# Patient Record
Sex: Female | Born: 1955 | Race: White | Hispanic: Yes | Marital: Single | Smoking: Former smoker
Health system: Southern US, Community
[De-identification: ages and names within clinical notes are randomized; demographics above are authoritative.]

## PROBLEM LIST (undated history)

## (undated) DIAGNOSIS — F32A Depression, unspecified: Secondary | ICD-10-CM

## (undated) DIAGNOSIS — F329 Major depressive disorder, single episode, unspecified: Secondary | ICD-10-CM

## (undated) DIAGNOSIS — I10 Essential (primary) hypertension: Secondary | ICD-10-CM

## (undated) DIAGNOSIS — E119 Type 2 diabetes mellitus without complications: Secondary | ICD-10-CM

## (undated) DIAGNOSIS — I639 Cerebral infarction, unspecified: Secondary | ICD-10-CM

## (undated) DIAGNOSIS — R569 Unspecified convulsions: Secondary | ICD-10-CM

## (undated) HISTORY — PX: CHOLECYSTECTOMY: SHX55

## (undated) HISTORY — PX: BREAST SURGERY: SHX581

## (undated) HISTORY — DX: Cerebral infarction, unspecified: I63.9

## (undated) HISTORY — PX: BREAST EXCISIONAL BIOPSY: SUR124

---
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
## 2013-09-01 ENCOUNTER — Emergency Department (HOSPITAL_COMMUNITY): Payer: Self-pay

## 2013-09-01 ENCOUNTER — Encounter (HOSPITAL_COMMUNITY): Payer: Self-pay | Admitting: Emergency Medicine

## 2013-09-01 ENCOUNTER — Emergency Department (HOSPITAL_COMMUNITY)
Admission: EM | Admit: 2013-09-01 | Discharge: 2013-09-01 | Disposition: A | Discharge status: Home / Self Care | Payer: Self-pay | Attending: Emergency Medicine | Admitting: Emergency Medicine

## 2013-09-01 DIAGNOSIS — F17200 Nicotine dependence, unspecified, uncomplicated: Secondary | ICD-10-CM | POA: Insufficient documentation

## 2013-09-01 DIAGNOSIS — R42 Dizziness and giddiness: Secondary | ICD-10-CM | POA: Insufficient documentation

## 2013-09-01 DIAGNOSIS — H8110 Benign paroxysmal vertigo, unspecified ear: Secondary | ICD-10-CM

## 2013-09-01 DIAGNOSIS — R112 Nausea with vomiting, unspecified: Secondary | ICD-10-CM | POA: Insufficient documentation

## 2013-09-01 LAB — BASIC METABOLIC PANEL
BUN: 8 mg/dL (ref 6–23)
CO2: 26 mEq/L (ref 19–32)
Calcium: 9.4 mg/dL (ref 8.4–10.5)
Chloride: 101 mEq/L (ref 96–112)
GFR calc Af Amer: >90 mL/min (ref >90)
GFR calc non Af Amer: >90 mL/min (ref >90)
Sodium: 137 mEq/L (ref 135–145)

## 2013-09-01 LAB — CBC
HCT: 47.8 % — ABNORMAL HIGH (ref 36.0–46.0)
MCH: 31.2 pg (ref 26.0–34.0)
MCV: 91.6 fL (ref 78.0–100.0)
Platelets: 259 10*3/uL (ref 150–400)
RBC: 5.22 MIL/uL — ABNORMAL HIGH (ref 3.87–5.11)
WBC: 9.2 10*3/uL (ref 4.0–10.5)

## 2013-09-01 MED ORDER — MECLIZINE HCL 25 MG PO TABS
25.0000 mg | ORAL_TABLET | Freq: Once | ORAL | Status: AC
  Administered 2013-09-01: 25 mg via ORAL
  Filled 2013-09-01: qty 1

## 2013-09-01 MED ORDER — ONDANSETRON HCL 4 MG/2ML IJ SOLN
4.0000 mg | Freq: Once | INTRAMUSCULAR | Status: AC
  Administered 2013-09-01: 4 mg via INTRAVENOUS
  Filled 2013-09-01: qty 2

## 2013-09-01 MED ORDER — ONDANSETRON HCL 4 MG PO TABS: 4.0000 mg | ORAL_TABLET | Freq: Four times a day (QID) | ORAL | Status: DC

## 2013-09-01 MED ORDER — LORAZEPAM 2 MG/ML IJ SOLN
1.0000 mg | Freq: Once | INTRAMUSCULAR | Status: AC
  Administered 2013-09-01: 1 mg via INTRAVENOUS
  Filled 2013-09-01: qty 1

## 2013-09-01 MED ORDER — MECLIZINE HCL 50 MG PO TABS: 50.0000 mg | ORAL_TABLET | Freq: Three times a day (TID) | ORAL | Status: DC | PRN

## 2013-09-01 NOTE — ED Notes (Addendum)
Pt does not speak english, tech translating. reports dizziness and nausea x3 days, has been taking dramamine with no relief. Headache 10/10 pain. Pt reports she is okay if she keeps her head still, but if she moves her head she will get really dizzy.

## 2013-09-01 NOTE — ED Provider Notes (Signed)
CSN: [id number]     Arrival date & time 09/01/13  1426 History   First MD Initiated Contact with Patient 09/01/13 1500     Chief Complaint  Patient presents with  . Dizziness   (Consider location/radiation/quality/duration/timing/severity/associated sxs/prior Treatment) HPI Pt presents with c/o feeling dizziness and like the room is spinning/vertigo.  Symptoms began 2-3 days ago.  She has also had significant nausea and vomiting- nonbilious/nonbloody.  No weakness of extremities, no changes in vision or speech.  No recent illness or fevers.  She has tried benadryl with mild relief of symptoms.  Symptoms are worse with turning her head and improved when she lies flat.  There are no other associated systemic symptoms, there are no other alleviating or modifying factors.  History reviewed. No pertinent past medical history. Past Surgical History  Procedure Laterality Date  . Cholecystectomy     History reviewed. No pertinent family history. History  Substance Use Topics  . Smoking status: Current Every Day Smoker -- 30 years    Types: Cigarettes  . Smokeless tobacco: Not on file  . Alcohol Use: Yes     Comment: weekly   OB History   Grav Para Term Preterm Abortions TAB SAB Ect Mult Living                 Review of Systems ROS reviewed and all otherwise negative except for mentioned in HPI  Allergies  Review of patient's allergies indicates no known allergies.  Home Medications   Current Outpatient Rx  Name  Route  Sig  Dispense  Refill  . dimenhyDRINATE (DRAMAMINE) 50 MG tablet   Oral   Take 50 mg by mouth every 8 (eight) hours as needed for nausea.         . meclizine (ANTIVERT) 50 MG tablet   Oral   Take 1 tablet (50 mg total) by mouth 3 (three) times daily as needed.   30 tablet   0   . ondansetron (ZOFRAN) 4 MG tablet   Oral   Take 1 tablet (4 mg total) by mouth every 6 (six) hours.   12 tablet   0    BP 165/88  Pulse 94  Temp(Src) 97.7 F (36.5 C)  (Oral)  Resp 18  SpO2 99% Vitals reviewed Physical Exam Physical Examination: General appearance - alert, well appearing, and in no distress Mental status - alert, oriented to person, place, and time Eyes - pupils equal round and reactive, EOMI Mouth - mucous membranes moist, pharynx normal without lesions Chest - clear to auscultation, no wheezes, rales or rhonchi, symmetric air entry Heart - normal rate, regular rhythm, normal S1, S2, no murmurs, rubs, clicks or gallops Abdomen - soft, nontender, nondistended, no masses or organomegaly Extremities - peripheral pulses normal, no pedal edema, no clubbing or cyanosis Neurologic- alert and oriented x 3, cranial nerves 2-12 tested and intact, strength 5/5 in extremities x 4, sensation intact, symptoms worse with lying flat and turning head to right  Skin - normal coloration and turgor, no rashes  ED Course  Procedures (including critical care time) Labs Review Labs Reviewed  CBC - Abnormal; Notable for the following:    RBC 5.22 (*)    Hemoglobin 16.3 (*)    HCT 47.8 (*)    All other components within normal limits  BASIC METABOLIC PANEL - Abnormal; Notable for the following:    Glucose, Bld 156 (*)    All other components within normal limits   Imaging Review Ct  Head Wo Contrast  09/01/2013   CLINICAL DATA:  Dizziness and nausea.  Headache.  EXAM: CT HEAD WITHOUT CONTRAST  TECHNIQUE: Contiguous axial images were obtained from the base of the skull through the vertex without intravenous contrast.  COMPARISON:  None.  FINDINGS: No acute intracranial abnormality is identified. Specifically, no intra or extra-axial hemorrhage, mass effect, hydrocephalus, or evidence of acute infarction. The skull is intact in the visualized paranasal sinuses and mastoid air cells are clear.  IMPRESSION: No acute intracranial abnormality.   Electronically Signed   By: Britta Mccreedy M.D.   On: 09/01/2013 16:23    EKG Interpretation    Date/Time:  Sunday  September 01 2013 14:43:09 EST Ventricular Rate:  93 PR Interval:  131 QRS Duration: 94 QT Interval:  354 QTC Calculation: 440 R Axis:   -14 Text Interpretation:  Sinus rhythm RSR' in V1 or V2, right VCD or RVH Borderline T abnormalities, diffuse leads ED PHYSICIAN INTERPRETATION AVAILABLE IN [facility] Confirmed by TEST, RECORD ([id number]), editor CLAYTON  CCT  CETT, ROBIN (2) on 09/02/2013 4:33:56 PM            MDM   1. Benign positional vertigo    Pt presenting with c/o vertigo.  She has normal neuro exam, head CT reassuring.  Symptoms positional in nature and worsened with turning head to side.  Pt treated with meclizine, zofran, ativan.  She was able to tolerate po fluids prior to discharge. Given information for neurology f/u.  Discharged with strict return precautions.  Pt agreeable with plan.   Ethelda Chick, MD 09/02/13 2352

## 2013-09-01 NOTE — ED Notes (Signed)
Patient transported to CT 

## 2016-09-05 ENCOUNTER — Ambulatory Visit: Payer: Self-pay | Attending: Family Medicine | Admitting: Family Medicine

## 2016-09-05 ENCOUNTER — Encounter: Payer: Self-pay | Admitting: Family Medicine

## 2016-09-05 VITALS — BP 125/87 | HR 88 | Temp 97.1°F | Resp 20 | Wt 272.0 lb

## 2016-09-05 DIAGNOSIS — R0601 Orthopnea: Secondary | ICD-10-CM | POA: Insufficient documentation

## 2016-09-05 DIAGNOSIS — N644 Mastodynia: Secondary | ICD-10-CM

## 2016-09-05 DIAGNOSIS — K219 Gastro-esophageal reflux disease without esophagitis: Secondary | ICD-10-CM

## 2016-09-05 DIAGNOSIS — Z7689 Persons encountering health services in other specified circumstances: Secondary | ICD-10-CM

## 2016-09-05 DIAGNOSIS — R42 Dizziness and giddiness: Secondary | ICD-10-CM | POA: Insufficient documentation

## 2016-09-05 LAB — CBC WITH DIFFERENTIAL/PLATELET
BASOS ABS: 0 {cells}/uL (ref 0–200)
Basophils Relative: 0 %
EOS ABS: 92 {cells}/uL (ref 15–500)
Eosinophils Relative: 1 %
HCT: 45.1 % — ABNORMAL HIGH (ref 35.0–45.0)
Hemoglobin: 15 g/dL (ref 11.7–15.5)
Lymphocytes Relative: 29 %
Lymphs Abs: 2668 cells/uL (ref 850–3900)
MCH: 30.3 pg (ref 27.0–33.0)
MCHC: 33.3 g/dL (ref 32.0–36.0)
MCV: 91.1 fL (ref 80.0–100.0)
MPV: 11.3 fL (ref 7.5–12.5)
Monocytes Absolute: 460 cells/uL (ref 200–950)
Monocytes Relative: 5 %
Neutro Abs: 5980 cells/uL (ref 1500–7800)
Neutrophils Relative %: 65 %
PLATELETS: 318 10*3/uL (ref 140–400)
RBC: 4.95 MIL/uL (ref 3.80–5.10)
RDW: 13 % (ref 11.0–15.0)
WBC: 9.2 10*3/uL (ref 3.8–10.8)

## 2016-09-05 LAB — BASIC METABOLIC PANEL
BUN: 10 mg/dL (ref 7–25)
CHLORIDE: 105 mmol/L (ref 98–110)
CO2: 30 mmol/L (ref 20–31)
Calcium: 9.4 mg/dL (ref 8.6–10.4)
Creat: 0.53 mg/dL (ref 0.50–0.99)
Glucose, Bld: 174 mg/dL — ABNORMAL HIGH (ref 65–99)
Potassium: 5 mmol/L (ref 3.5–5.3)
Sodium: 140 mmol/L (ref 135–146)

## 2016-09-05 MED ORDER — RANITIDINE HCL 150 MG PO TABS: 150.0000 mg | ORAL_TABLET | Freq: Two times a day (BID) | ORAL | 2 refills | Status: DC

## 2016-09-05 MED ORDER — MECLIZINE HCL 32 MG PO TABS: 32.0000 mg | ORAL_TABLET | Freq: Three times a day (TID) | ORAL | 0 refills | Status: DC | PRN

## 2016-09-05 NOTE — Progress Notes (Signed)
Subjective:   Chief Complaint  Patient presents with  . Breast Pain   HPI Heather [name] 60 y.o. female presents with c/o shortness of breath and night, dizziness, bilateral breast tenderness. She reports waking 5 to 6 x's throughout the night SOB. She reports orthopnea. Denies any swelling of the extremites. She has no complaints of cough. Reports history of dizziness with changing positions. She reports nausea only after meals. She reports bilateral breast tenderness with right being greater than left. Denies any trauma, breast lumps, nipple discharge, or indentations. She reports receiving a mammogram more than a year ago in [place].         Family History  Problem Relation Age of Onset  . Diabetes Mother   . Pulmonary disease Mother   . Heart disease Father     Social History Main Topics  . Smoking status: Current Every Day Smoker    Years: 30.00    Types: Cigarettes  . Smokeless tobacco: Not on file  . Alcohol use Yes     Comment: weekly  . Drug use: No  . Sexual activity: Not on file    Outpatient Medications Prior to Visit  Medication Sig Dispense Refill  . meclizine (ANTIVERT) 50 MG tablet Take 1 tablet (50 mg total) by mouth 3 (three) times daily as needed. 30 tablet 0  . dimenhyDRINATE (DRAMAMINE) 50 MG tablet Take 50 mg by mouth every 8 (eight) hours as needed for nausea.    . ondansetron (ZOFRAN) 4 MG tablet Take 1 tablet (4 mg total) by mouth every 6 (six) hours. (Patient not taking: Reported on 09/05/2016) 12 tablet 0   No facility-administered medications prior to visit.     No Known Allergies  Review of Systems  HENT: Negative.   Respiratory: Positive for shortness of breath (c/o SOB at night with frequent waking throughout the night).   Cardiovascular: Positive for orthopnea. Negative for leg swelling.  Gastrointestinal: Positive for abdominal pain and nausea (after meals). Negative for constipation.  Musculoskeletal: Positive for myalgias (bilateral breast  tenderness left greater than right).  Neurological: Positive for dizziness.  Psychiatric/Behavioral: Negative.        Objective:    Physical Exam  Constitutional: She appears well-developed and well-nourished.  Eyes: Conjunctivae and EOM are normal. Pupils are equal, round, and reactive to light. Right eye exhibits no discharge. Left eye exhibits no discharge.  Neck: Normal range of motion. Neck supple.  Cardiovascular: Normal rate, regular rhythm, normal heart sounds and intact distal pulses.   Pulmonary/Chest: Effort normal and breath sounds normal. No respiratory distress. She exhibits tenderness. Right breast exhibits tenderness. Right breast exhibits no inverted nipple, no mass, no nipple discharge and no skin change. Left breast exhibits tenderness. Left breast exhibits no inverted nipple, no nipple discharge and no skin change. Mass: firm tissue; no lumps palpated. Breasts are symmetrical.    Abdominal: Soft. Bowel sounds are normal. There is tenderness (Left lumbar region).  Lymphadenopathy:    She has no cervical adenopathy.    BP 125/87   Pulse 88   Temp 97.1 F (36.2 C) (Oral)   Resp 20   Wt 272 lb (123.4 kg)   SpO2 97%  Wt Readings from Last 3 Encounters:  09/05/16 272 lb (123.4 kg)      Assessment & Plan:   Problem List Items Addressed This Visit    None    Visit Diagnoses    Orthopnea    -  Primary   Relevant Orders  Brain natriuretic peptide   CBC with Differential   Nocturnal polysomnography (NPSG)   Breast tenderness in female       Relevant Orders   MM Digital Diagnostic Bilat   Vertigo       Relevant Medications   meclizine (ANTIVERT) 32 MG tablet   Other Relevant Orders   Basic Metabolic Panel   -Orthostatic VS.   Gastroesophageal reflux disease without esophagitis       Relevant Medications   ranitidine (ZANTAC) 150 MG tablet        I have discontinued Ms. Pena's dimenhyDRINATE, meclizine, and ondansetron. I am also having her start on  meclizine and ranitidine. Additionally, I am having her maintain her ibuprofen and aspirin EC.  Meds ordered this encounter  Medications  . ibuprofen (ADVIL,MOTRIN) 200 MG tablet    Sig: Take 200 mg by mouth every 6 (six) hours as needed.  Marland Kitchen aspirin EC 81 MG tablet    Sig: Take 81 mg by mouth as needed.  . meclizine (ANTIVERT) 32 MG tablet    Sig: Take 1 tablet (32 mg total) by mouth 3 (three) times daily as needed.    Dispense:  30 tablet    Refill:  0    Order Specific Question:   Supervising Provider    Answer:   Quentin Angst [id number]  . ranitidine (ZANTAC) 150 MG tablet    Sig: Take 1 tablet (150 mg total) by mouth 2 (two) times daily.    Dispense:  30 tablet    Refill:  2    Order Specific Question:   Supervising Provider    Answer:   Quentin Angst [id number]   Follow up : As needed  Arrie Senate, FNP

## 2016-09-05 NOTE — Patient Instructions (Addendum)
Mamografa (Mammogram) Una mamografa es un radiografa de las mamas que se realiza para determinar si hay cambios que no son normales. Este estudio permite explorar y Clinical research associate cualquier cambio que pudiera sugerir la presencia de cncer de mama. Tambin puede ayudar a identificar otros cambios y variaciones en las mamas. ANTES DEL PROCEDIMIENTO  Hgase este estudio aproximadamente 1 o 2 semanas despus de la menstruacin. Generalmente, este es el momento en que las mamas estn menos sensibles.  Si consulta a un mdico nuevo o cambia de Financial risk analyst, enve las [employer] anteriores al [employer].  El da del estudio, [address] las [place] y [place].  No use desodorantes, perfumes, lociones o talcos el da del estudio.  Qutese las alhajas del cuello.  Use prendas que pueda ponerse y sacarse fcilmente. PROCEDIMIENTO  Se quitar la ropa de la cintura para [place]. Se colocar una bata.  Debe permanecer de pie delante de la mquina de rayos-X.  Se colocar cada mama entre dos placas de vidrio o de plstico. Las placas comprimirn las mamas durante unos segundos. Intente estar lo ms relajada posible. Esto no causa ningn dao a las mamas. Si siente alguna molestia, ser pasajera.  Se tomarn radiografas desde diferentes ngulos de cada mama. Este procedimiento puede variar segn el mdico y el hospital. DESPUS DEL PROCEDIMIENTO  La mamografa ser examinada por un especialista (radilogo).  Tal vez deba repetir [employer] del estudio. Esto depende de la calidad de las imgenes.  Pregunte la fecha en que los resultados estarn disponibles. Asegrese de [employer].  Puede retomar sus actividades habituales. Esta informacin no tiene Theme park manager el consejo del mdico. Asegrese de hacerle al mdico cualquier pregunta que tenga. Document Released: 10/22/2010 Document Revised: 09/08/2011 Document Reviewed: 11/28/2014 Elsevier Interactive Patient Education   2017 Elsevier Inc. [place] (Dizziness) Los mareos son un problema muy frecuente. Causan sensacin de inestabilidad o de desvanecimiento. Puede sentir que se va a desmayar. Un mareo puede provocarle una lesin si se tropieza o se cae. Cualquier persona puede marearse, pero los [place] son ms frecuentes en los [employer]. Esta afeccin puede tener muchas causas, por ejemplo:  Medicamentos.  Deshidratacin.  Enfermedad. CUIDADOS EN EL HOGAR Estas indicaciones pueden ayudarlo con el trastorno: Comida y bebida   Beba suficiente lquido para Pharmacologist el pis (orina) claro o de color amarillo plido. Esto evita la deshidratacin. Trate de beber ms lquidos transparentes, como agua.  No beba alcohol.  Limite la cantidad de cafena que bebe o come si el mdico se lo indic.  Limite la cantidad de sal que bebe o come si el mdico se lo indic. Actividad   Evite los movimientos rpidos.  Cuando se levante de una silla, sujtese hasta sentirse bien.  Por la maana, sintese primero a un lado de la cama. Cuando se sienta bien, pngase lentamente de [address] se sostiene de algo, hasta que sepa que ha logrado el equilibrio.  Mueva las piernas con frecuencia si debe estar de pie en un lugar durante mucho tiempo. Mientras est de pie, contraiga y relaje los msculos de las piernas.  No conduzca vehculos ni utilice maquinarias pesadas si se siente mareado.  Evite agacharse si se siente mareado. En su casa, coloque los objetos de modo que le resulte fcil alcanzarlos sin Public librarian. Estilo de vida   No consuma ningn producto que contenga tabaco, lo que incluye cigarrillos, tabaco de Theatre manager o Administrator, Civil Service. Si necesita ayuda para dejar de fumar, consulte al [employer] Express.  Trate de  reducir el nivel de estrs practicando actividades como el yoga o la meditacin. Hable con el mdico si necesita ayuda. Instrucciones generales   Controle sus mareos para ver si hay cambios.  Tome los  medicamentos solamente como se lo haya indicado el mdico. Hable con el mdico si cree que algn medicamento que est tomando es la causa de sus [place].  Infrmele a un amigo o a un familiar si se siente mareado. Pdale a esta persona que llame al mdico si observa cambios en su comportamiento.  Concurra a todas las visitas de control como se lo haya indicado el mdico. Esto es importante. SOLICITE AYUDA SI:  Los American Express.  Los [employer] o la sensacin de Production assistant, radio.  Siente malestar estomacal (nuseas).  Tiene problemas para escuchar.  Aparecen nuevos sntomas.  Cuando est de pie se siente inestable o que la habitacin da vueltas. SOLICITE AYUDA DE INMEDIATO SI:  Vomita o tiene diarrea y no puede comer ni beber nada.  Tiene dificultad para lo siguiente:  Hablar.  Caminar.  Tragar.  Usar los brazos, las manos o las piernas.  Siente una debilidad generalizada.  No piensa con claridad o tiene dificultades para armar oraciones. Es posible que un amigo o un familiar adviertan que esto ocurre.  Tiene los siguientes sntomas:  Journalist, newspaper.  Dolor en el vientre (abdomen).  Falta de aire.  Sudoracin.  Cambios en la visin.  Hemorragias.  Dolores de [place].  Dolor o rigidez en el cuello.  Grant Ruts. Esta informacin no tiene Theme park manager el consejo del mdico. Asegrese de hacerle al mdico cualquier pregunta que tenga. Document Released: 09/08/2011 Document Revised: 02/03/2015 Document Reviewed: 09/15/2014 Elsevier Interactive Patient Education  2017 Elsevier Inc.  Opciones de alimentos para pacientes con reflujo gastroesofgico - Adultos (Food Choices for Gastroesophageal Reflux Disease, Adult) Cuando se tiene reflujo gastroesofgico (ERGE), los alimentos que se ingieren y los hbitos de alimentacin son Engineer, production. Elegir los alimentos adecuados puede ayudar a [employer]. QU PAUTAS DEBO SEGUIR?  Elija las  frutas, los vegetales, los cereales integrales y los productos lcteos con bajo contenido de [place].  Elija las carnes de [place], de pescado y de ave con bajo contenido de grasas.  Limite las grasas, [address] [place], los aderezos para [place], la [place], los frutos secos y Programme researcher, broadcasting/film/video.  Lleve un registro de alimentos. Esto ayuda a identificar los alimentos que ocasionan sntomas.  Evite los alimentos que le ocasionen sntomas. Pueden ser distintos para cada persona.  Haga comidas pequeas durante Glass blower/designer de 3 comidas abundantes.  Coma lentamente, en un lugar donde est distendido.  Limite el consumo de alimentos fritos.  Cocine los alimentos utilizando mtodos que no sean la fritura.  Evite el consumo alcohol.  Evite beber grandes cantidades de lquidos con las comidas.  Evite agacharse o recostarse hasta despus de 2 o 3horas de haber comido. QU ALIMENTOS NO SE RECOMIENDAN? Estos son algunos alimentos y bebidas que pueden empeorar los sntomas: Garment/textile technologist. Jugo de tomate. Salsa de tomate y espagueti. Ajes. [place] y [place]. Rbano picante. Frutas  Naranjas, pomelos y limn (fruta y [place]). Carnes  Carnes de [place], de pescado y de ave con gran contenido de grasas. Esto incluye los perros calientes, las [place], el [place], la salchicha, el salame y el tocino. Lcteos  [place]he entera y [place]. PPG [employer]. Crema. [place]. Helados. Queso crema. Bebidas  T o caf. Bebidas gaseosas o bebidas energizantes. Condimentos  Salsa  picante. Salsa barbacoa. Dulces/postres  Chocolate y cacao. [place]. Menta y mentol. Grasas y [employer]. Esto incluye las papas fritas. Otros  [place]inagre. Especias picantes. Esto incluye la pimienta negra, la pimienta blanca, la pimienta roja, la pimienta de cayena, el curry en polvo, los clavos de [place], el jengibre y el [place] en polvo. Esta no es Raytheon de los alimentos y las bebidas que se Insurance account manager. Comunquese con el nutricionista para recibir ms informacin.  Esta informacin no tiene Theme park manager el consejo del mdico. Asegrese de hacerle al mdico cualquier pregunta que tenga. Document Released: 03/20/2012 Document Revised: 10/10/2014 Document Reviewed: 07/24/2013 Elsevier Interactive Patient Education  2017 [employer].

## 2016-09-05 NOTE — Progress Notes (Signed)
C/o Breat tenderness. Diagnosed w/ bilateral benign breast cyst.

## 2016-09-06 LAB — BRAIN NATRIURETIC PEPTIDE: Brain Natriuretic Peptide: 21.5 pg/mL (ref <100)

## 2016-09-09 ENCOUNTER — Telehealth: Payer: Self-pay | Admitting: *Deleted

## 2016-09-09 NOTE — Telephone Encounter (Signed)
Medical Assistant used [employer] to contact patient.  Interpreter Name: Renold Don #: [id number] Patient was not available, [employer] left patient a voicemail. Voicemail states to give a call back to [place] with [facility] at [phone].

## 2016-09-09 NOTE — Telephone Encounter (Signed)
-----   Message from Lizbeth Bark, [place] sent at 09/06/2016  7:09 PM EST ----- -BNP test levels were normal. When BNP level is elevated it can indicate heart failure.  -Follow up as needed.

## 2016-09-12 ENCOUNTER — Emergency Department (HOSPITAL_COMMUNITY): Payer: Self-pay

## 2016-09-12 ENCOUNTER — Encounter (HOSPITAL_COMMUNITY): Payer: Self-pay | Admitting: *Deleted

## 2016-09-12 ENCOUNTER — Emergency Department (HOSPITAL_COMMUNITY)
Admission: EM | Admit: 2016-09-12 | Discharge: 2016-09-12 | Disposition: A | Discharge status: Home / Self Care | Payer: Self-pay | Attending: Emergency Medicine | Admitting: Emergency Medicine

## 2016-09-12 ENCOUNTER — Other Ambulatory Visit: Payer: Self-pay | Admitting: Family Medicine

## 2016-09-12 ENCOUNTER — Telehealth: Payer: Self-pay | Admitting: Family Medicine

## 2016-09-12 DIAGNOSIS — Z7982 Long term (current) use of aspirin: Secondary | ICD-10-CM | POA: Insufficient documentation

## 2016-09-12 DIAGNOSIS — B9789 Other viral agents as the cause of diseases classified elsewhere: Secondary | ICD-10-CM

## 2016-09-12 DIAGNOSIS — N644 Mastodynia: Secondary | ICD-10-CM

## 2016-09-12 DIAGNOSIS — F17210 Nicotine dependence, cigarettes, uncomplicated: Secondary | ICD-10-CM | POA: Insufficient documentation

## 2016-09-12 DIAGNOSIS — J069 Acute upper respiratory infection, unspecified: Secondary | ICD-10-CM | POA: Insufficient documentation

## 2016-09-12 DIAGNOSIS — I10 Essential (primary) hypertension: Secondary | ICD-10-CM | POA: Insufficient documentation

## 2016-09-12 HISTORY — DX: Essential (primary) hypertension: I10

## 2016-09-12 MED ORDER — ALBUTEROL SULFATE HFA 108 (90 BASE) MCG/ACT IN AERS
2.0000 | INHALATION_SPRAY | Freq: Once | RESPIRATORY_TRACT | Status: DC
  Filled 2016-09-12: qty 6.7

## 2016-09-12 MED ORDER — ALBUTEROL SULFATE HFA 108 (90 BASE) MCG/ACT IN AERS: 2.0000 | INHALATION_SPRAY | Freq: Four times a day (QID) | RESPIRATORY_TRACT | 2 refills | Status: DC | PRN

## 2016-09-12 NOTE — ED Notes (Signed)
Brought patient back to room; patient getting undressed, into a gown, on continuous pulse oximetry and blood pressure cuff

## 2016-09-12 NOTE — ED Triage Notes (Signed)
Pt states cough x 1 week with sternal pain that radiates to back after coughing.  VS stable

## 2016-09-12 NOTE — Telephone Encounter (Signed)
Pt. Returned call. Please f/u °

## 2016-09-12 NOTE — ED Provider Notes (Signed)
[facility] Provider Note   CSN: [id number] Arrival date & time: 09/12/16  1103     History   Chief Complaint Chief Complaint  Patient presents with  . Cough    HPI Heather [name] is a 60 y.o. female.   Cough  This is a new problem. The current episode started more than 1 week ago. The problem occurs constantly. The problem has been gradually worsening. The cough is non-productive. There has been no fever. Pertinent negatives include no chest pain, no chills, no ear pain, no headaches, no rhinorrhea, no sore throat and no shortness of breath. She has tried nothing for the symptoms. The treatment provided no relief. She is not a smoker.    Past Medical History:  Diagnosis Date  . Hypertension     There are no active problems to display for this patient.   Past Surgical History:  Procedure Laterality Date  . CHOLECYSTECTOMY      OB History    No data available       Home Medications    Prior to Admission medications   Medication Sig Start Date End Date Taking? Authorizing Provider  albuterol (PROVENTIL HFA;VENTOLIN HFA) 108 (90 Base) MCG/ACT inhaler Inhale 2 puffs into the lungs every 6 (six) hours as needed for wheezing or shortness of breath. 09/12/16   Cherlynn Perches, MD  aspirin EC 81 MG tablet Take 81 mg by mouth as needed.    Historical Provider, MD  ibuprofen (ADVIL,MOTRIN) 200 MG tablet Take 200 mg by mouth every 6 (six) hours as needed.    Historical Provider, MD  meclizine (ANTIVERT) 32 MG tablet Take 1 tablet (32 mg total) by mouth 3 (three) times daily as needed. 09/05/16   Lizbeth Bark, FNP  ranitidine (ZANTAC) 150 MG tablet Take 1 tablet (150 mg total) by mouth 2 (two) times daily. 09/05/16   Lizbeth Bark, FNP    Family History Family History  Problem Relation Age of Onset  . Diabetes Mother   . Pulmonary disease Mother   . Heart disease Father     Social History Social History  Substance Use Topics  . Smoking status: Current  Every Day Smoker    Packs/day: 0.50    Years: 30.00    Types: Cigarettes  . Smokeless tobacco: Never Used  . Alcohol use Yes     Comment: weekly     Allergies   Patient has no known allergies.   Review of Systems Review of Systems  Constitutional: Negative for chills and fever.  HENT: Negative for ear pain, rhinorrhea and sore throat.   Eyes: Negative for pain and visual disturbance.  Respiratory: Positive for cough. Negative for shortness of breath.   Cardiovascular: Negative for chest pain and palpitations.  Gastrointestinal: Negative for abdominal pain and vomiting.  Genitourinary: Negative for dysuria and hematuria.  Musculoskeletal: Negative for arthralgias and back pain.  Skin: Negative for color change and rash.  Neurological: Negative for seizures, syncope and headaches.  All other systems reviewed and are negative.    Physical Exam Updated Vital Signs BP 135/89 (BP Location: Right Arm)   Pulse 81   Temp 98 F (36.7 C) (Oral)   Resp 18   Ht 5\' 6"  (1.676 m)   Wt 123.4 kg   SpO2 97%   BMI 43.90 kg/m   Physical Exam  Constitutional: She appears well-developed and well-nourished. No distress.  HENT:  Head: Normocephalic and atraumatic.  Eyes: Conjunctivae are normal.  Neck: Neck supple.  Cardiovascular: Normal rate, regular rhythm and normal heart sounds.  Exam reveals no friction rub.   No murmur heard. Pulmonary/Chest: Effort normal and breath sounds normal. No accessory muscle usage. No tachypnea. No respiratory distress. She has no decreased breath sounds. She has no wheezes. She has no rales.  Transmitted upper airway sounds mild tenderness palpation diffusely across the chest  Abdominal: Soft. There is no tenderness.  Musculoskeletal: She exhibits no edema.  Neurological: She is alert.  Skin: Skin is warm and dry.  Psychiatric: She has a normal mood and affect.  Nursing note and vitals reviewed.    ED Treatments / Results  Labs (all labs  ordered are listed, but only abnormal results are displayed) Labs Reviewed - No data to display  EKG  EKG Interpretation  Date/Time:  Monday September 12 2016 11:13:43 EST Ventricular Rate:  100 PR Interval:  134 QRS Duration: 80 QT Interval:  356 QTC Calculation: 459 R Axis:   -37 Text Interpretation:  Normal sinus rhythm Left axis deviation Abnormal ECG No significant change was found Confirmed by CAMPOS  MD, Caryn Bee ([id number]) on 09/12/2016 3:15:10 PM       Radiology Dg Chest 2 View  Result Date: 09/12/2016 CLINICAL DATA:  Midline chest and back pain for 1 week. Congestion with productive cough and fever. Arm weakness. EXAM: CHEST  2 VIEW COMPARISON:  None. FINDINGS: Trachea is midline. Heart size normal. Lungs are clear. No pleural fluid. IMPRESSION: No acute findings. Electronically Signed   By: Leanna Battles M.D.   On: 09/12/2016 11:57    Procedures Procedures (including critical care time)  Medications Ordered in ED Medications  albuterol (PROVENTIL HFA;VENTOLIN HFA) 108 (90 Base) MCG/ACT inhaler 2 puff (not administered)     Initial Impression / Assessment and Plan / ED Course  I have reviewed the triage vital signs and the nursing notes.  Pertinent labs & imaging results that were available during my care of the patient were reviewed by me and considered in my medical decision making (see chart for details).  Clinical Course     60 year old female comes today with about a week of dry cough mild clear yellow sputum production. Subjective fevers sick contact at home. Not hypoxic not No increased work of breathing. No fevers. Chest x-ray is without signs of pneumonia EKG is at baseline. Likely a viral section with underlying cough she with albuterol MDI. Bone age no murmurs factors for PE only resector ACS is hypertension there is no exertional component to this. She is safe for discharge home. Vital signs stable time discharge. Strict return precautions are  given.  Final Clinical Impressions(s) / ED Diagnoses   Final diagnoses:  Viral URI with cough    New Prescriptions New Prescriptions   ALBUTEROL (PROVENTIL HFA;VENTOLIN HFA) 108 (90 BASE) MCG/ACT INHALER    Inhale 2 puffs into the lungs every 6 (six) hours as needed for wheezing or shortness of breath.     Cherlynn Perches, MD 09/12/16 1521    Azalia Bilis, MD 09/12/16 [phone]

## 2016-09-12 NOTE — Telephone Encounter (Signed)
Patient's family member called the office regarding a missed call from [place]. I informed family member that nurse and interpreter called patient to inform her that her lab results were normal. Pt's family member understood and had no further questions.  Thank you.

## 2016-09-12 NOTE — Telephone Encounter (Signed)
Medical Assistant used [employer] to contact patient.  Interpreter Name: Craige Cotta #: [id number] Patient was not available, [employer] left patient a voicemail. Patient is aware of lab results being normal via VM. Medical Assistant left message on patient's home and cell voicemail. Voicemail states to give a call back to [place] with [facility] at [phone].

## 2016-10-31 ENCOUNTER — Ambulatory Visit (HOSPITAL_BASED_OUTPATIENT_CLINIC_OR_DEPARTMENT_OTHER): Payer: Self-pay | Attending: Family Medicine

## 2016-12-26 ENCOUNTER — Ambulatory Visit: Payer: Self-pay

## 2016-12-28 ENCOUNTER — Ambulatory Visit: Payer: Self-pay | Attending: Family Medicine

## 2017-06-21 ENCOUNTER — Ambulatory Visit: Payer: Self-pay | Attending: Family Medicine

## 2017-06-21 ENCOUNTER — Telehealth: Payer: Self-pay | Admitting: Family Medicine

## 2017-06-21 ENCOUNTER — Other Ambulatory Visit: Payer: Self-pay | Admitting: Family Medicine

## 2017-06-21 ENCOUNTER — Ambulatory Visit: Payer: Self-pay

## 2017-06-21 DIAGNOSIS — N644 Mastodynia: Secondary | ICD-10-CM

## 2017-06-21 NOTE — Telephone Encounter (Signed)
[facility] to follow up regarding orders. Spoke with Faby who will contact patient with more information regarding appointment. Also recommend patient schedule in office visit.

## 2017-06-21 NOTE — Telephone Encounter (Signed)
[facility] to follow up regarding orders. Spoke with Faby who will contact patient with more information regarding appointment.

## 2017-06-21 NOTE — Telephone Encounter (Signed)
Pt came into office requesting status of referral for mammogram, Pt was referred out in December but did not receive a call with appt date, Pt states she is still having pain. Please advice?

## 2017-06-21 NOTE — Telephone Encounter (Signed)
CMA call patient regarding f/up appt with pcp due to her breast pain    Patient stated that she has an upcoming appt on 10/04 but I told her she can try to make her appt sooner or is up to here if she wants to wait until that day   Patient was aware and understood

## 2017-06-21 NOTE — Telephone Encounter (Signed)
Pt came into office requesting status of referral for mammogram, Pt was referred out in December but did not receive a call with appt date, Pt states she is still having pain. Please f/up

## 2017-07-06 ENCOUNTER — Ambulatory Visit: Payer: Self-pay | Attending: Family Medicine | Admitting: Family Medicine

## 2017-07-06 ENCOUNTER — Encounter: Payer: Self-pay | Admitting: Family Medicine

## 2017-07-06 VITALS — BP 129/84 | HR 91 | Temp 97.8°F | Resp 18 | Ht 67.0 in | Wt 258.2 lb

## 2017-07-06 DIAGNOSIS — M2540 Effusion, unspecified joint: Secondary | ICD-10-CM

## 2017-07-06 DIAGNOSIS — N6323 Unspecified lump in the left breast, lower outer quadrant: Secondary | ICD-10-CM

## 2017-07-06 DIAGNOSIS — M7989 Other specified soft tissue disorders: Secondary | ICD-10-CM | POA: Insufficient documentation

## 2017-07-06 DIAGNOSIS — N644 Mastodynia: Secondary | ICD-10-CM

## 2017-07-06 DIAGNOSIS — M069 Rheumatoid arthritis, unspecified: Secondary | ICD-10-CM | POA: Insufficient documentation

## 2017-07-06 MED ORDER — NAPROXEN 500 MG PO TABS: ORAL_TABLET | ORAL | 0 refills | Status: DC

## 2017-07-06 NOTE — Progress Notes (Signed)
Patient is here for both breast pain patient stated that no discharge but had a cyst issue back than    Patient had not done a MM since 2 years ago   Patient complains elbow pain & hand pain

## 2017-07-06 NOTE — Progress Notes (Signed)
Subjective:  Patient ID: Heather [name], female    DOB: 03/07/1956  Age: 61 y.o. MRN: [id number]  CC: Establish Care and Breast Pain   HPI Amanpreet Delmont presents to establish care. Interpreter services used. She complaints of bilateral  breast pain. Onset 1 month ago. She denies an denting or dimpling of the breast, nipple discharge, or injury to the breast.  She denies any family history  or breast cancer. She does report breast biopsy about 4 years ago due to breast cyst. She reports results were benign. She also complaints of symptoms of pain and chronic swelling. Swelling initially located in the RLE since history of a fall she reports occurred over 2 years ago.  Symptoms intermittent. She reports now noticing swelling in the BUE at the elbow joints and LLE at the knee joint as well.  Symptoms have worsened the last 2 months. She denies any recent history of falls or injury, she denies any erythema.     Outpatient Medications Prior to Visit  Medication Sig Dispense Refill  . albuterol (PROVENTIL HFA;VENTOLIN HFA) 108 (90 Base) MCG/ACT inhaler Inhale 2 puffs into the lungs every 6 (six) hours as needed for wheezing or shortness of breath. (Patient not taking: Reported on 07/06/2017) 1 Inhaler 2  . aspirin EC 81 MG tablet Take 81 mg by mouth as needed.    Marland Kitchen ibuprofen (ADVIL,MOTRIN) 200 MG tablet Take 200 mg by mouth every 6 (six) hours as needed.    . meclizine (ANTIVERT) 32 MG tablet Take 1 tablet (32 mg total) by mouth 3 (three) times daily as needed. (Patient not taking: Reported on 07/06/2017) 30 tablet 0  . ranitidine (ZANTAC) 150 MG tablet Take 1 tablet (150 mg total) by mouth 2 (two) times daily. (Patient not taking: Reported on 07/06/2017) 30 tablet 2   No facility-administered medications prior to visit.     ROS Review of Systems  Constitutional: Negative.   Respiratory: Negative.   Cardiovascular: Negative.   Gastrointestinal: Negative.   Musculoskeletal: Positive for myalgias (  myalgias (bilateral breast tenderness left greater than right)).   Objective:  BP 129/84 (BP Location: Left Arm, Patient Position: Sitting, Cuff Size: Normal)   Pulse 91   Temp 97.8 F (36.6 C) (Oral)   Resp 18   Ht 5\' 7"  (1.702 m)   Wt 258 lb 3.2 oz (117.1 kg)   SpO2 97%   BMI 40.44 kg/m   BP/Weight 07/06/2017 09/12/2016 09/05/2016  Systolic BP 129 135 125  Diastolic BP 84 89 87  Wt. (Lbs) 258.2 272 272  BMI 40.44 43.9 -     Physical Exam  Constitutional: She appears well-developed and well-nourished.  Neck: Normal range of motion. Neck supple.  Cardiovascular: Normal rate, regular rhythm, normal heart sounds and intact distal pulses.   Pulmonary/Chest: Effort normal and breath sounds normal. Right breast exhibits tenderness. Right breast exhibits no inverted nipple, no nipple discharge and no skin change. Left breast exhibits tenderness. Left breast exhibits no inverted nipple, no nipple discharge and no skin change.    Abdominal: Soft. Bowel sounds are normal.  Musculoskeletal: Normal range of motion. She exhibits edema (biltatral knee; elbows).  Lymphadenopathy:    She has no cervical adenopathy.  Skin: Skin is warm and dry.  Nursing note and vitals reviewed.     Assessment & Plan:   1. Breast tenderness in female Patient given MM scholarship application to complete for breast imaging. Attempt was made to contact [facility] while patient was in  office today, no answer at the time. - MM DIAG BREAST TOMO BILATERAL; Future - US BREAST COMPLETE UNI LEFT INC AXILLA; Future - US BREAST COMPLETE UNI RIGHT INC AXILLA; Future - naproxen (NAPROSYN) 500 MG tablet; TAKE ONE TABLET BY MOUTH TWICE A DAY WITH MEAL FOR 10 DAYS. THEN TAKE ONT TABLET BY MOUTH ONCE A DAY WITH MEAL AS NEEDED FOR PAIN.  Dispense: 60 tablet; Refill: 0  2. Breast lump on left side at 5 o'clock position Patient given MM scholarship application to complete for breast imaging.  - MM DIAG BREAST TOMO  BILATERAL; Future - US BREAST COMPLETE UNI LEFT INC AXILLA; Future - naproxen (NAPROSYN) 500 MG tablet; TAKE ONE TABLET BY MOUTH TWICE A DAY WITH MEAL FOR 10 DAYS. THEN TAKE ONT TABLET BY MOUTH ONCE A DAY WITH MEAL AS NEEDED FOR PAIN.  Dispense: 60 tablet; Refill: 0  3. Joint swelling  - ANA - Rheumatoid factor - C-reactive protein - CBC   Meds ordered this encounter  Medications  . naproxen (NAPROSYN) 500 MG tablet    Sig: TAKE ONE TABLET BY MOUTH TWICE A DAY WITH MEAL FOR 10 DAYS. THEN TAKE ONT TABLET BY MOUTH ONCE A DAY WITH MEAL AS NEEDED FOR PAIN.    Dispense:  60 tablet    Refill:  0    Order Specific Question:   Supervising Provider    Answer:   Quentin Angst [[id number]]    Follow-up: Return in about 2 months (around 09/05/2017), or if symptoms worsen or fail to improve, for Breast Pain.   Lizbeth Bark FNP

## 2017-07-06 NOTE — Patient Instructions (Signed)
Sensibilidad en las mamas  (Breast Tenderness)  La sensibilidad en las mamas es un problema frecuente en las mujeres de todas las edades. y puede causar molestias leves o dolor intenso. Sus causas son variadas. Su médico determinará la causa probable de la sensibilidad mediante el examen de las mamas, las preguntas sobre los síntomas y la indicación de algunos estudios. Por lo general, la sensibilidad en las mamas no significa que tenga cáncer de mama.  INSTRUCCIONES PARA EL CUIDADO EN EL HOGAR   A menudo, la sensibilidad en las mamas puede tratarse en el hogar. Puede intentar lo siguiente:  · Probarse un nuevo sostén que le brinde más sujeción, especialmente mientras hace actividad física.  · Usar un sostén con mejor sujeción o uno deportivo mientras duerme cuando las mamas están muy sensibles.  · Si tiene una lesión mamaria, aplique hielo en la zona:  ? Ponga el hielo en una bolsa plástica.  ? Colóquese una toalla entre la piel y la bolsa de hielo.  ? Deje el hielo durante 20 minutos y aplíquelo 2 a 3 veces por día.  · Si tiene las mamas repletas de leche debido a la lactancia, intente lo siguiente:  ? Extráigase leche manualmente o con un sacaleche.  ? Aplíquese una compresa tibia en las mamas para ayudar a la descarga.  · Tome analgésicos de venta libre si su médico lo autoriza.  · Tome otros medicamentos que su médico le recete, entre ellos, antibióticos o anticonceptivos.  A largo plazo, puede aliviar la sensibilidad en las mamas si hace lo siguiente:  · Disminuye el consumo de cafeína.  · Disminuye la cantidad de grasa de la dieta.  Lleva un registro de los días y las horas cuando tiene mayor sensibilidad en las mamas. Esto será de ayuda para que usted y su médico encuentren la causa de la sensibilidad y cómo aliviarla. Además, aprenda cómo examinarse las mamas en casa. Esto la ayudará a palpar un crecimiento o un bulto fuera de lo normal que podría causar la sensibilidad.  SOLICITE ATENCIÓN MÉDICA SI:    · Cualquier zona de la mama está dura, enrojecida y caliente al tacto. Puede ser un signo de infección.  · Hay secreción de los pezones (y no está amamantando). En especial, vigile la secreción de sangre o pus.  · Tiene fiebre, además de sensibilidad en las mamas.  · Tiene un bulto nuevo o doloroso en la mama que no desaparece después de la finalización del período menstrual.  · Ha intentando controlar el dolor en casa, pero no desaparece.  · El dolor de la mama es más intenso o le dificulta hacer las cosas que hace habitualmente durante el día.  Esta información no tiene como fin reemplazar el consejo del médico. Asegúrese de hacerle al médico cualquier pregunta que tenga.  Document Released: 07/10/2013  Elsevier Interactive Patient Education © 2017 Elsevier Inc.

## 2017-07-07 LAB — CBC
Hematocrit: 43.7 % (ref 34.0–46.6)
Hemoglobin: 14.9 g/dL (ref 11.1–15.9)
MCH: 30.2 pg (ref 26.6–33.0)
MCHC: 34.1 g/dL (ref 31.5–35.7)
MCV: 89 fL (ref 79–97)
PLATELETS: 295 10*3/uL (ref 150–379)
RBC: 4.94 x10E6/uL (ref 3.77–5.28)
RDW: 13.3 % (ref 12.3–15.4)
WBC: 10.5 10*3/uL (ref 3.4–10.8)

## 2017-07-07 LAB — ANA: ANA: NEGATIVE

## 2017-07-07 LAB — C-REACTIVE PROTEIN: CRP: 4.6 mg/L (ref 0.0–4.9)

## 2017-07-07 LAB — RHEUMATOID FACTOR: Rhuematoid fact SerPl-aCnc: <10 IU/mL (ref 0.0–13.9)

## 2017-07-13 ENCOUNTER — Telehealth: Payer: Self-pay

## 2017-07-13 NOTE — Telephone Encounter (Signed)
-----   Message from Lizbeth Bark, [place] sent at 07/13/2017 12:41 PM EDT ----- Labs that evaluate for conditions that cause chronic inflammation are normal. Labs that evaluated your blood cells, fluid and electrolyte balance are normal. No signs of anemia, acute infection, or inflammation present.

## 2017-07-13 NOTE — Telephone Encounter (Signed)
CMA call patient regarding lab results   Patient Verify DOB   Patient was aware and understood  

## 2017-07-24 ENCOUNTER — Other Ambulatory Visit (HOSPITAL_COMMUNITY): Payer: Self-pay | Admitting: *Deleted

## 2017-07-24 DIAGNOSIS — N644 Mastodynia: Secondary | ICD-10-CM

## 2017-07-27 ENCOUNTER — Ambulatory Visit
Admission: RE | Admit: 2017-07-27 | Discharge: 2017-07-27 | Disposition: A | Discharge status: Home / Self Care | Payer: No Typology Code available for payment source | Source: Ambulatory Visit | Attending: Obstetrics and Gynecology | Admitting: Obstetrics and Gynecology

## 2017-07-27 ENCOUNTER — Other Ambulatory Visit: Payer: Self-pay

## 2017-07-27 ENCOUNTER — Encounter (HOSPITAL_COMMUNITY): Payer: Self-pay

## 2017-07-27 ENCOUNTER — Ambulatory Visit (HOSPITAL_COMMUNITY)
Admission: RE | Admit: 2017-07-27 | Discharge: 2017-07-27 | Disposition: A | Discharge status: Home / Self Care | Payer: Self-pay | Source: Ambulatory Visit | Attending: Obstetrics and Gynecology | Admitting: Obstetrics and Gynecology

## 2017-07-27 VITALS — BP 150/90 | Temp 98.5°F | Ht 68.0 in | Wt 258.2 lb

## 2017-07-27 DIAGNOSIS — N644 Mastodynia: Secondary | ICD-10-CM

## 2017-07-27 DIAGNOSIS — Z01419 Encounter for gynecological examination (general) (routine) without abnormal findings: Secondary | ICD-10-CM

## 2017-07-27 NOTE — Patient Instructions (Signed)
Explained breast self awareness with Heather [name]. Let patient know [facility] will cover Pap smears and HPV typing every 5 years unless has a history of abnormal Pap smears. Referred patient to the [facility] for a diagnostic mammogram. Appointment scheduled for Thursday, July 27, 2017 at 1520.Let patient know will follow up with her within the next couple weeks with results of Pap smear by letter or phone. Discussed smoking cessation with patient. Referred patient to the [facility] Quitline and gave resources to free smoking cessation classes at [facility]. [name] verbalized understanding.  Ademola Vert, Kathaleen Maser, RN 3:28 PM

## 2017-07-27 NOTE — Progress Notes (Signed)
Complaints of bilateral outer breast pain that is greater within the left breast. Patient states the pain comes and goes. Patient rates the pain at a 8 out of 10.  Pap Smear: Pap smear completed today. Last Pap smear was 4 years ago and normal per patient. Per patient has no history of an abnormal Pap smear. No Pap smear results are in EPIC.  Physical exam: Breasts Breasts symmetrical. No skin abnormalities bilateral breasts. No nipple retraction bilateral breasts. No nipple discharge bilateral breasts. No lymphadenopathy. No lumps palpated bilateral breasts. Complaints of bilateral outer breast tenderness on exam that is greater within the left breast. Referred patient to the [facility] for a diagnostic mammogram. Appointment scheduled for Thursday, July 27, 2017 at 1520.  Pelvic/Bimanual   Ext Genitalia No lesions, no swelling and no discharge observed on external genitalia.         Vagina Vagina pink and normal texture. No lesions or discharge observed in vagina.          Cervix Cervix is present. Cervix pink and of normal texture. No discharge observed.     Uterus Uterus is present and palpable. Uterus in normal position and normal size.        Adnexae Bilateral ovaries present and palpable. No tenderness on palpation.          Rectovaginal No rectal exam completed today since patient had no rectal complaints. No skin abnormalities observed on exam.    Smoking History: Patient is a current smoker. Discussed smoking cessation with patient. Referred patient to the [facility] Quitline and gave resources to free smoking cessation classes at [facility].  Patient Navigation: Patient education provided. Access to services provided for patient through [facility] program. Spanish interpreter provided.   Colorectal Cancer Screening: Per patient has never had a colonoscopy completed. No complaints today. FIT Test given to patient to complete and return to [facility].  Used Spanish  interpreter Leana Roe from [place].

## 2017-07-28 ENCOUNTER — Encounter (HOSPITAL_COMMUNITY): Payer: Self-pay | Admitting: *Deleted

## 2017-07-28 LAB — CYTOLOGY - PAP
ADEQUACY: ABSENT
DIAGNOSIS: NEGATIVE
HPV: NOT DETECTED

## 2017-07-31 ENCOUNTER — Telehealth (HOSPITAL_COMMUNITY): Payer: Self-pay | Admitting: *Deleted

## 2017-07-31 NOTE — Telephone Encounter (Signed)
Telephoned patient at home number and advised patient of negative pap smear results. HPV was negative. Next pap due in five years. Patient voiced understanding. Used interpreter Delorise Royals.

## 2017-08-02 ENCOUNTER — Other Ambulatory Visit: Payer: Self-pay

## 2017-08-07 ENCOUNTER — Encounter (HOSPITAL_COMMUNITY): Payer: Self-pay | Admitting: *Deleted

## 2017-08-07 LAB — FECAL OCCULT BLOOD, IMMUNOCHEMICAL: FECAL OCCULT BLD: NEGATIVE

## 2017-08-07 NOTE — Progress Notes (Signed)
Letter mailed to patient with negative Fit Test results.  

## 2017-10-10 ENCOUNTER — Ambulatory Visit: Payer: No Typology Code available for payment source | Admitting: Family Medicine

## 2017-10-18 ENCOUNTER — Emergency Department (HOSPITAL_COMMUNITY): Payer: Self-pay

## 2017-10-18 ENCOUNTER — Emergency Department (HOSPITAL_COMMUNITY)
Admission: EM | Admit: 2017-10-18 | Discharge: 2017-10-19 | Disposition: A | Discharge status: Home / Self Care | Payer: Self-pay | Attending: Emergency Medicine | Admitting: Emergency Medicine

## 2017-10-18 ENCOUNTER — Encounter (HOSPITAL_COMMUNITY): Payer: Self-pay | Admitting: Emergency Medicine

## 2017-10-18 DIAGNOSIS — R109 Unspecified abdominal pain: Secondary | ICD-10-CM

## 2017-10-18 DIAGNOSIS — R1031 Right lower quadrant pain: Secondary | ICD-10-CM | POA: Insufficient documentation

## 2017-10-18 DIAGNOSIS — M549 Dorsalgia, unspecified: Secondary | ICD-10-CM

## 2017-10-18 DIAGNOSIS — I10 Essential (primary) hypertension: Secondary | ICD-10-CM | POA: Insufficient documentation

## 2017-10-18 DIAGNOSIS — F17210 Nicotine dependence, cigarettes, uncomplicated: Secondary | ICD-10-CM | POA: Insufficient documentation

## 2017-10-18 DIAGNOSIS — M545 Low back pain: Secondary | ICD-10-CM | POA: Insufficient documentation

## 2017-10-18 LAB — COMPREHENSIVE METABOLIC PANEL
ALK PHOS: 97 U/L (ref 38–126)
ALT: 26 U/L (ref 14–54)
AST: 26 U/L (ref 15–41)
Albumin: 4.2 g/dL (ref 3.5–5.0)
Anion gap: 8 (ref 5–15)
BUN: 15 mg/dL (ref 6–20)
CALCIUM: 9.1 mg/dL (ref 8.9–10.3)
CO2: 26 mmol/L (ref 22–32)
CREATININE: 0.48 mg/dL (ref 0.44–1.00)
Chloride: 100 mmol/L — ABNORMAL LOW (ref 101–111)
Glucose, Bld: 141 mg/dL — ABNORMAL HIGH (ref 65–99)
Potassium: 4.5 mmol/L (ref 3.5–5.1)
Sodium: 134 mmol/L — ABNORMAL LOW (ref 135–145)
Total Bilirubin: 0.5 mg/dL (ref 0.3–1.2)
Total Protein: 7.6 g/dL (ref 6.5–8.1)

## 2017-10-18 LAB — CBC
HCT: 47.1 % — ABNORMAL HIGH (ref 36.0–46.0)
Hemoglobin: 15.6 g/dL — ABNORMAL HIGH (ref 12.0–15.0)
MCH: 30.6 pg (ref 26.0–34.0)
MCHC: 33.1 g/dL (ref 30.0–36.0)
MCV: 92.5 fL (ref 78.0–100.0)
PLATELETS: 311 10*3/uL (ref 150–400)
RBC: 5.09 MIL/uL (ref 3.87–5.11)
RDW: 12.8 % (ref 11.5–15.5)
WBC: 14.4 10*3/uL — ABNORMAL HIGH (ref 4.0–10.5)

## 2017-10-18 LAB — URINALYSIS, ROUTINE W REFLEX MICROSCOPIC
Bilirubin Urine: NEGATIVE
GLUCOSE, UA: NEGATIVE mg/dL
HGB URINE DIPSTICK: NEGATIVE
Ketones, ur: 5 mg/dL — AB
Leukocytes, UA: NEGATIVE
Nitrite: NEGATIVE
Protein, ur: NEGATIVE mg/dL
Specific Gravity, Urine: 1.012 (ref 1.005–1.030)
pH: 6 (ref 5.0–8.0)

## 2017-10-18 LAB — LIPASE, BLOOD: Lipase: 31 U/L (ref 11–51)

## 2017-10-18 MED ORDER — ONDANSETRON HCL 4 MG/2ML IJ SOLN
4.0000 mg | Freq: Once | INTRAMUSCULAR | Status: AC
  Administered 2017-10-18: 4 mg via INTRAVENOUS
  Filled 2017-10-18: qty 2

## 2017-10-18 MED ORDER — MORPHINE SULFATE (PF) 4 MG/ML IV SOLN
4.0000 mg | Freq: Once | INTRAVENOUS | Status: AC
  Administered 2017-10-18: 4 mg via INTRAVENOUS
  Filled 2017-10-18: qty 1

## 2017-10-18 MED ORDER — ONDANSETRON 4 MG PO TBDP
4.0000 mg | ORAL_TABLET | Freq: Once | ORAL | Status: AC | PRN
  Administered 2017-10-18: 4 mg via ORAL
  Filled 2017-10-18: qty 1

## 2017-10-18 MED ORDER — FENTANYL CITRATE (PF) 100 MCG/2ML IJ SOLN
50.0000 ug | INTRAMUSCULAR | Status: DC | PRN
  Administered 2017-10-18: 50 ug via NASAL
  Filled 2017-10-18: qty 2

## 2017-10-18 MED ORDER — IOPAMIDOL (ISOVUE-300) INJECTION 61%
INTRAVENOUS | Status: AC
  Administered 2017-10-19: 100 mL
  Filled 2017-10-18: qty 100

## 2017-10-18 NOTE — ED Triage Notes (Signed)
Pt comes from home via EMS with complaints acute abdominal pain for the past 2 hours.  States it is central and sharp in nature that spreads to her flank area. Vitals stable in route. No emesis. Ambulatory.  Speaks spanish.

## 2017-10-18 NOTE — ED Provider Notes (Addendum)
[facility]-EMERGENCY DEPT Provider Note   CSN: [id number] Arrival date & time: 10/18/17  1431     History   Chief Complaint Chief Complaint  Patient presents with  . Abdominal Pain  . Back Pain    HPI Heather [name] is a 62 y.o. female.  62yo F w/ PMH including HTN and cholecystectomy who p/w abdominal and back pain. She began having back pain yesterday after she bent over. She took a pain pill and pain resolved. Earlier today she ate a small plantain and began having the pain again. Today she has had pain in her back but also in her abdomen. Pain is constant and goes from her umbilicus to R side and radiates to back. She has had nausea but no vomiting, diarrhea, constipation, urinary symptoms, fever. She has had occasional mild cough. She reports similar pain ~6 months after having gallbladder surgery in [place]. She states that they told her there was "inflammation" at the internal incision site. She reports straining a lot recently from moving recently. 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   The history is provided by the patient. The history is limited by a language barrier. A language interpreter was used.  Abdominal Pain    Back Pain   Associated symptoms include abdominal pain.    Past Medical History:  Diagnosis Date  . Hypertension     There are no active problems to display for this patient.   Past Surgical History:  Procedure Laterality Date  . BREAST EXCISIONAL BIOPSY    . BREAST SURGERY     right breast  . CHOLECYSTECTOMY      OB History    Gravida Para Term Preterm AB Living   1         1   SAB TAB Ectopic Multiple Live Births           1       Home Medications    Prior to Admission medications   Medication Sig Start Date End Date Taking? Authorizing Provider  albuterol (PROVENTIL HFA;VENTOLIN HFA) 108 (90 Base) MCG/ACT inhaler Inhale 2 puffs into the lungs every 6 (six) hours as needed  for wheezing or shortness of breath. 09/12/16  Yes Cherlynn Perches, MD  aspirin EC 81 MG tablet Take 81 mg by mouth as needed.   Yes [provider]  ibuprofen (ADVIL,MOTRIN) 200 MG tablet Take 200 mg by mouth every 6 (six) hours as needed.   Yes [provider]  meclizine (ANTIVERT) 32 MG tablet Take 1 tablet (32 mg total) by mouth 3 (three) times daily as needed. 09/05/16  Yes Hairston, Mandesia R, FNP  naproxen (NAPROSYN) 500 MG tablet TAKE ONE TABLET BY MOUTH TWICE A DAY WITH MEAL FOR 10 DAYS. THEN TAKE ONT TABLET BY MOUTH ONCE A DAY WITH MEAL AS NEEDED FOR PAIN. 07/06/17  Yes Hairston, Oren Beckmann, FNP  ranitidine (ZANTAC) 150 MG tablet Take 1 tablet (150 mg total) by mouth 2 (two) times daily. Patient not taking: Reported on 07/06/2017 09/05/16   Lizbeth Bark, FNP    Family History Family History  Problem Relation Age of Onset  . Diabetes Mother   . Pulmonary disease Mother   . Heart disease Father     Social History Social History   Tobacco Use  . Smoking status: Current Every Day Smoker    Packs/day: 0.50    Years: 30.00    Pack years: 15.00  Types: Cigarettes  . Smokeless tobacco: Never Used  Substance Use Topics  . Alcohol use: Yes    Comment: weekly  . Drug use: No     Allergies   Patient has no known allergies.   Review of Systems Review of Systems  Gastrointestinal: Positive for abdominal pain.  Musculoskeletal: Positive for back pain.   All other systems reviewed and are negative except that which was mentioned in HPI   Physical Exam Updated Vital Signs BP (!) 207/106 Comment: informed the nurses  Pulse 78   Temp 97.6 F (36.4 C) (Oral)   Resp 20   SpO2 97%   Physical Exam  Constitutional: She is oriented to person, place, and time. She appears well-developed and well-nourished.  Uncomfortable, mild distress due to pain  HENT:  Head: Normocephalic and atraumatic.  Moist mucous membranes  Eyes: Conjunctivae are normal. Pupils  are equal, round, and reactive to light.  Neck: Neck supple.  Cardiovascular: Normal rate, regular rhythm and normal heart sounds.  No murmur heard. Pulmonary/Chest: Effort normal and breath sounds normal.  Abdominal: Soft. She exhibits no distension. Bowel sounds are decreased. There is tenderness in the right upper quadrant and periumbilical area. There is no rebound and no guarding.  Musculoskeletal: She exhibits no edema.  Neurological: She is alert and oriented to person, place, and time.  Fluent speech  Skin: Skin is warm and dry.  Psychiatric: She has a normal mood and affect. Judgment normal.  Nursing note and vitals reviewed.    ED Treatments / Results  Labs (all labs ordered are listed, but only abnormal results are displayed) Labs Reviewed  COMPREHENSIVE METABOLIC PANEL - Abnormal; Notable for the following components:      Result Value   Sodium 134 (*)    Chloride 100 (*)    Glucose, Bld 141 (*)    All other components within normal limits  CBC - Abnormal; Notable for the following components:   WBC 14.4 (*)    Hemoglobin 15.6 (*)    HCT 47.1 (*)    All other components within normal limits  URINALYSIS, ROUTINE W REFLEX MICROSCOPIC - Abnormal; Notable for the following components:   Ketones, ur 5 (*)    All other components within normal limits  LIPASE, BLOOD    EKG  EKG Interpretation None       Radiology No results found.  Procedures Procedures (including critical care time)  Medications Ordered in ED Medications  fentaNYL (SUBLIMAZE) injection 50 mcg (50 mcg Nasal Given 10/18/17 1604)  ondansetron (ZOFRAN-ODT) disintegrating tablet 4 mg (4 mg Oral Given 10/18/17 1602)  morphine 4 MG/ML injection 4 mg (4 mg Intravenous Given 10/18/17 2211)  ondansetron (ZOFRAN) injection 4 mg (4 mg Intravenous Given 10/18/17 2211)  iopamidol (ISOVUE-300) 61 % injection (100 mLs  Contrast Given 10/19/17 0005)  morphine 4 MG/ML injection 4 mg (4 mg Intravenous Given  10/18/17 2253)     Initial Impression / Assessment and Plan / ED Course  I have reviewed the triage vital signs and the nursing notes.  Pertinent labs & imaging results that were available during my care of the patient were reviewed by me and considered in my medical decision making (see chart for details).     PT w/ 1 day of abdominal pain w/ some radiation to R back/side, in mild distress due to pain on exam.  She had tenderness in periumbilical and right upper quadrant abdomen.  Labs show WBC 14.4, otherwise unremarkable.  Normal UA.  In conversation using interpreter, she mentioned that she had the same pain 6 months after her previous cholecystectomy and was told it was related to scar tissue.  I am concerned that she may be referring to a bowel obstruction.  Because of her surgical history and current symptoms, I ordered CT of abdomen to evaluate for bowel obstruction or other acute process.    Ct with no acute findings to explain sx. Pt comfortable and pain free on reassessment. Given that sx started when she bent over, muscle strain is certainly possible. I do not feel her sx suggest aortic dissection, ACS, or other life threatening process. Discussed supportive measures, extensively reviewed return precautions.  Final Clinical Impressions(s) / ED Diagnoses   Final diagnoses:  Abdominal pain, unspecified abdominal location  Back pain, unspecified back location, unspecified back pain laterality, unspecified chronicity    ED Discharge Orders    None       Koden Hunzeker, Ambrose Finland, MD 10/19/17 0012    Clarene Duke, Ambrose Finland, MD 10/19/17 [phone]

## 2017-10-18 NOTE — ED Triage Notes (Signed)
Patient reports when she bent over yesterday got severe pains in her back then today pain is now radiating towards right abd. Denies any v/d, urinary problems.

## 2017-10-18 NOTE — ED Notes (Signed)
Bed: PF79 Expected date:  Expected time:  Means of arrival:  Comments: [name]

## 2017-10-19 MED ORDER — NAPROXEN 250 MG PO TABS: 250.0000 mg | ORAL_TABLET | Freq: Two times a day (BID) | ORAL | 0 refills | Status: DC

## 2017-10-19 MED ORDER — PROMETHAZINE HCL 25 MG PO TABS: 25.0000 mg | ORAL_TABLET | Freq: Four times a day (QID) | ORAL | 0 refills | Status: DC | PRN

## 2017-10-19 MED ORDER — KETOROLAC TROMETHAMINE 30 MG/ML IJ SOLN
30.0000 mg | Freq: Once | INTRAMUSCULAR | Status: AC
  Administered 2017-10-19: 30 mg via INTRAVENOUS
  Filled 2017-10-19: qty 1

## 2017-12-17 ENCOUNTER — Inpatient Hospital Stay (HOSPITAL_COMMUNITY)
Admission: EM | Admit: 2017-12-17 | Discharge: 2017-12-19 | DRG: 024 | Disposition: A | Discharge status: Rehabilitation Facility | Payer: Medicaid Other | Attending: Neurology | Admitting: Neurology

## 2017-12-17 ENCOUNTER — Emergency Department (HOSPITAL_COMMUNITY): Payer: Medicaid Other

## 2017-12-17 DIAGNOSIS — I63511 Cerebral infarction due to unspecified occlusion or stenosis of right middle cerebral artery: Secondary | ICD-10-CM | POA: Diagnosis present

## 2017-12-17 DIAGNOSIS — I639 Cerebral infarction, unspecified: Secondary | ICD-10-CM

## 2017-12-17 DIAGNOSIS — R29707 NIHSS score 7: Secondary | ICD-10-CM | POA: Diagnosis present

## 2017-12-17 DIAGNOSIS — R29810 Facial weakness: Secondary | ICD-10-CM | POA: Diagnosis present

## 2017-12-17 DIAGNOSIS — I10 Essential (primary) hypertension: Secondary | ICD-10-CM | POA: Diagnosis present

## 2017-12-17 DIAGNOSIS — G8194 Hemiplegia, unspecified affecting left nondominant side: Secondary | ICD-10-CM | POA: Diagnosis present

## 2017-12-17 DIAGNOSIS — R471 Dysarthria and anarthria: Secondary | ICD-10-CM | POA: Diagnosis present

## 2017-12-17 DIAGNOSIS — N644 Mastodynia: Secondary | ICD-10-CM

## 2017-12-17 DIAGNOSIS — R739 Hyperglycemia, unspecified: Secondary | ICD-10-CM | POA: Diagnosis not present

## 2017-12-17 DIAGNOSIS — Z6841 Body Mass Index (BMI) 40.0 and over, adult: Secondary | ICD-10-CM

## 2017-12-17 DIAGNOSIS — E669 Obesity, unspecified: Secondary | ICD-10-CM | POA: Diagnosis present

## 2017-12-17 DIAGNOSIS — Z79899 Other long term (current) drug therapy: Secondary | ICD-10-CM

## 2017-12-17 DIAGNOSIS — I6601 Occlusion and stenosis of right middle cerebral artery: Secondary | ICD-10-CM | POA: Diagnosis present

## 2017-12-17 DIAGNOSIS — Z791 Long term (current) use of non-steroidal anti-inflammatories (NSAID): Secondary | ICD-10-CM | POA: Diagnosis not present

## 2017-12-17 DIAGNOSIS — R402413 Glasgow coma scale score 13-15, at hospital admission: Secondary | ICD-10-CM | POA: Diagnosis present

## 2017-12-17 DIAGNOSIS — I672 Cerebral atherosclerosis: Secondary | ICD-10-CM | POA: Diagnosis present

## 2017-12-17 DIAGNOSIS — H518 Other specified disorders of binocular movement: Secondary | ICD-10-CM | POA: Diagnosis present

## 2017-12-17 DIAGNOSIS — R110 Nausea: Secondary | ICD-10-CM

## 2017-12-17 DIAGNOSIS — Z7982 Long term (current) use of aspirin: Secondary | ICD-10-CM

## 2017-12-17 DIAGNOSIS — F17210 Nicotine dependence, cigarettes, uncomplicated: Secondary | ICD-10-CM | POA: Diagnosis present

## 2017-12-17 LAB — I-STAT CHEM 8, ED
BUN: 15 mg/dL (ref 6–20)
Calcium, Ion: 1.16 mmol/L (ref 1.15–1.40)
Chloride: 104 mmol/L (ref 101–111)
Creatinine, Ser: 0.5 mg/dL (ref 0.44–1.00)
Glucose, Bld: 202 mg/dL — ABNORMAL HIGH (ref 65–99)
HEMATOCRIT: 42 % (ref 36.0–46.0)
HEMOGLOBIN: 14.3 g/dL (ref 12.0–15.0)
Potassium: 4.1 mmol/L (ref 3.5–5.1)
SODIUM: 142 mmol/L (ref 135–145)
TCO2: 28 mmol/L (ref 22–32)

## 2017-12-17 LAB — CBC
HCT: 40.9 % (ref 36.0–46.0)
Hemoglobin: 13.1 g/dL (ref 12.0–15.0)
MCH: 30.2 pg (ref 26.0–34.0)
MCHC: 32 g/dL (ref 30.0–36.0)
MCV: 94.2 fL (ref 78.0–100.0)
Platelets: 247 10*3/uL (ref 150–400)
RBC: 4.34 MIL/uL (ref 3.87–5.11)
RDW: 13.1 % (ref 11.5–15.5)
WBC: 8 10*3/uL (ref 4.0–10.5)

## 2017-12-17 LAB — COMPREHENSIVE METABOLIC PANEL
ALBUMIN: 3.5 g/dL (ref 3.5–5.0)
ALT: 23 U/L (ref 14–54)
ANION GAP: 8 (ref 5–15)
AST: 21 U/L (ref 15–41)
Alkaline Phosphatase: 76 U/L (ref 38–126)
BUN: 13 mg/dL (ref 6–20)
CHLORIDE: 105 mmol/L (ref 101–111)
CO2: 25 mmol/L (ref 22–32)
Calcium: 9 mg/dL (ref 8.9–10.3)
Creatinine, Ser: 0.57 mg/dL (ref 0.44–1.00)
GFR calc Af Amer: >60 mL/min (ref >60)
GFR calc non Af Amer: >60 mL/min (ref >60)
Glucose, Bld: 204 mg/dL — ABNORMAL HIGH (ref 65–99)
POTASSIUM: 4.1 mmol/L (ref 3.5–5.1)
Sodium: 138 mmol/L (ref 135–145)
Total Bilirubin: 0.3 mg/dL (ref 0.3–1.2)
Total Protein: 6.5 g/dL (ref 6.5–8.1)

## 2017-12-17 LAB — I-STAT TROPONIN, ED: Troponin i, poc: 0 ng/mL (ref 0.00–0.08)

## 2017-12-17 LAB — DIFFERENTIAL
BASOS ABS: 0 10*3/uL (ref 0.0–0.1)
BASOS PCT: 0 %
EOS ABS: 0.1 10*3/uL (ref 0.0–0.7)
EOS PCT: 1 %
Lymphocytes Relative: 38 %
Lymphs Abs: 3 10*3/uL (ref 0.7–4.0)
Monocytes Absolute: 0.6 10*3/uL (ref 0.1–1.0)
Monocytes Relative: 7 %
NEUTROS PCT: 54 %
Neutro Abs: 4.3 10*3/uL (ref 1.7–7.7)

## 2017-12-17 LAB — PROTIME-INR
INR: 0.89
PROTHROMBIN TIME: 11.9 s (ref 11.4–15.2)

## 2017-12-17 LAB — APTT: APTT: 28 s (ref 24–36)

## 2017-12-17 LAB — CBG MONITORING, ED: GLUCOSE-CAPILLARY: 184 mg/dL — AB (ref 65–99)

## 2017-12-17 MED ORDER — ACETAMINOPHEN 160 MG/5ML PO SOLN: 650.0000 mg | ORAL | Status: DC | PRN

## 2017-12-17 MED ORDER — SODIUM CHLORIDE 0.9 % IV SOLN: INTRAVENOUS | Status: DC

## 2017-12-17 MED ORDER — STROKE: EARLY STAGES OF RECOVERY BOOK
Freq: Once | Status: AC
  Administered 2017-12-18: 05:00:00
  Filled 2017-12-17: qty 1

## 2017-12-17 MED ORDER — IOPAMIDOL (ISOVUE-370) INJECTION 76%
INTRAVENOUS | Status: AC
  Filled 2017-12-17: qty 50

## 2017-12-17 MED ORDER — IOPAMIDOL (ISOVUE-370) INJECTION 76%: 50.0000 mL | Freq: Once | INTRAVENOUS | Status: DC | PRN

## 2017-12-17 MED ORDER — SENNOSIDES-DOCUSATE SODIUM 8.6-50 MG PO TABS: 1.0000 | ORAL_TABLET | Freq: Every evening | ORAL | Status: DC | PRN

## 2017-12-17 MED ORDER — ACETAMINOPHEN 325 MG PO TABS: 650.0000 mg | ORAL_TABLET | ORAL | Status: DC | PRN

## 2017-12-17 MED ORDER — ACETAMINOPHEN 650 MG RE SUPP: 650.0000 mg | RECTAL | Status: DC | PRN

## 2017-12-17 NOTE — ED Triage Notes (Signed)
Per [facility], LSN 2102, pt was with family and suddenly dropped a glass out of her left hand, had left side weakness, and left sided facial droop with dysarthria. Hypertensive, Following all commands, axox4. Speaks spanish.

## 2017-12-17 NOTE — Consult Note (Addendum)
Chief Complaint: Left side weakness, dysarthria  History obtained from: Patient and Chart    HPI:                                                                                                                                       Heather [name] is an 62 y.o. female Hispanic with PMH of HTN,Obesity who presents [facility] emergency room as a stroke alert with left hemiparesis. Patient is Spanish-speaking and initially EMS was told that her symptoms began at 9 PM after she dropped a glass in her left hand. CT head showed no hemorrhage and tPA was mixed. However after nurse spoke to her in spanish, patient stated that she had a headache that began around 6:30pm and had left side weakness as she was  outside window period upon completion of CT scan.   CTA head and neck was performed which showed a superior division M2 cutoff. After discussing with family given her borderline NIHSS of 7, however worsening symptoms ( from EMS assessment to arrival at hospital)  we decided to take the patient for mechanical thrombectomy.  Date last known well:3.17.19 Time last known well: around  6.30 pm tPA Given: no, outside window at time of CT scan NIHSS: 7 ( at time of arrival)  Baseline MRS 0   Past Medical History:  Diagnosis Date  . Hypertension     Past Surgical History:  Procedure Laterality Date  . BREAST EXCISIONAL BIOPSY    . BREAST SURGERY     right breast  . CHOLECYSTECTOMY      Family History  Problem Relation Age of Onset  . Diabetes Mother   . Pulmonary disease Mother   . Heart disease Father    Social History:  reports that she has been smoking cigarettes.  She has a 15.00 pack-year smoking history. she has never used smokeless tobacco. She reports that she drinks alcohol. She reports that she does not use drugs.  Allergies: No Known Allergies  Medications:                                                                                                                        I  reviewed home medications   ROS:  14 systems reviewed and negative except above    Examination:                                                                                                      General: Appears well-developed and well-nourished.  Psych: Affect appropriate to situation Eyes: No scleral injection HENT: No OP obstrucion Head: Normocephalic.  Cardiovascular: Normal rate and regular rhythm.  Respiratory: Effort normal and breath sounds normal to anterior ascultation GI: Soft.  No distension. There is no tenderness.  Skin: WDI   Neurological Examination Mental Status: Alert, oriented, thought content appropriate.  Speech fluent without evidence of aphasia. Able to follow 3 step commands without difficulty. Cranial Nerves: II: Visual fields grossly normal,  III,IV, VI: ptosis not present, extra-ocular motions intact bilaterally, pupils equal, round, reactive to light and accommodation V,VII: left facial droop VIII: hearing normal bilaterally IX,X: uvula rises symmetrically XI: bilateral shoulder shrug XII: midline tongue extension Motor: Right : Upper extremity   5/5    Left:     Upper extremity   4/5  Lower extremity   5/5     Lower extremity   4/5 Tone and bulk:normal tone throughout; no atrophy noted Sensory: reduced sensation to light touch and extinction on left side Deep Tendon Reflexes: 2+ and symmetric throughout Plantars: Right: downgoing   Left: downgoing Cerebellar: normal finger-to-nose, normal rapid alternating movements and normal heel-to-shin test Gait: normal gait and station     Lab Results: Basic Metabolic Panel: Recent Labs  Lab 12/17/17 2255 12/17/17 2300  NA 138 142  K 4.1 4.1  CL 105 104  CO2 25  --   GLUCOSE 204* 202*  BUN 13 15  CREATININE 0.57 0.50  CALCIUM 9.0  --     CBC: Recent Labs   Lab 12/17/17 2255 12/17/17 2300  WBC 8.0  --   NEUTROABS 4.3  --   HGB 13.1 14.3  HCT 40.9 42.0  MCV 94.2  --   PLT 247  --     Coagulation Studies: Recent Labs    12/17/17 2255  LABPROT 11.9  INR 0.89    Imaging: Ct Angio Head W Or Wo Contrast  Result Date: 12/18/2017 CLINICAL DATA:  Initial evaluation for acute left-sided deficits. EXAM: CT ANGIOGRAPHY HEAD AND NECK TECHNIQUE: Multidetector CT imaging of the head and neck was performed using the standard protocol during bolus administration of intravenous contrast. Multiplanar CT image reconstructions and MIPs were obtained to evaluate the vascular anatomy. Carotid stenosis measurements (when applicable) are obtained utilizing NASCET criteria, using the distal internal carotid diameter as the denominator. CONTRAST:  50 cc of Isovue 370. COMPARISON:  Prior CT from earlier the same day. FINDINGS: CTA NECK FINDINGS Aortic arch: Visualized aortic arch of normal caliber with normal branch pattern. No high-grade stenosis about the origin of the great vessels. Visualized subclavian arteries widely patent. Right carotid system: Right common carotid artery widely patent to the bifurcation without stenosis. Right ICA widely patent from the bifurcation to the skull base without stenosis, dissection, or occlusion. Medial station of the right  carotid artery system into the retropharyngeal space noted. No significant atheromatous narrowing about the right carotid bifurcation. Left carotid system: Left common and internal carotid arteries are widely patent without stenosis, dissection, or occlusion. No significant atheromatous narrowing about the left carotid bifurcation. Vertebral arteries: Both of the vertebral arteries arise from the subclavian arteries. Left vertebral artery dominant with a diffusely hypoplastic right vertebral artery. Vertebral arteries widely patent within the neck without stenosis, dissection, or occlusion. Skeleton: No acute  osseus abnormality. No worrisome lytic or blastic osseous lesions. Other neck: No acute soft tissue abnormality within the neck. Salivary glands normal. Thyroid within normal limits. No adenopathy. Upper chest: Visualized upper chest within normal limits. Visualized upper lungs are grossly clear. Few tiny tree-in-bud nodular densities noted at the medial left upper lobe, favored to reflect mucoid impaction, of doubtful significance. Review of the MIP images confirms the above findings CTA HEAD FINDINGS Anterior circulation: Petrous, cavernous, and supraclinoid segments of the internal carotid arteries are widely patent without flow-limiting stenosis. ICA termini widely patent. Dominant right A1 segment widely patent. Left A1 segment diffusely hypoplastic, accounting for the slightly diminutive left ICA is compared to the right. Widely patent and normal anterior communicating artery. Anterior cerebral arteries well perfused to their distal aspects without stenosis. Focal irregularity with mild to moderate stenosis noted at the mid right M1 segment (series 8, image 101). Right M1 patent distally. There is abrupt near occlusion of a proximal right M2 branch, middle division (series 11, image 20). Right M2 immediately branches just distally, with markedly thready and irregular flow within the M3 and M4 segments distally (series 8, image 90). Finding suspicious for possible subocclusive thrombus. A superimposed high-grade stenosis may be contributory as well. Overall, there is decreased perfusion with poor collateralization within the distal right MCA territory as compared to the left (series 11, image 14). Left M1 segment widely patent without stenosis or occlusion. Normal left MCA bifurcation. No proximal left M2 occlusion. Distal left MCA branches well perfused. Mild distal small vessel atheromatous irregularity. Posterior circulation: Vertebral arteries patent to the vertebrobasilar junction without stenosis. Left  vertebral artery dominant. Patent left PICA. Right PICA not well visualized. Basilar artery widely patent to its distal aspect. Superior cerebral arteries patent bilaterally. Both of the posterior cerebral arteries primarily supplied via the basilar. Short-segment mild to moderate proximal left P1 stenosis. Mild atheromatous irregularity within the bilateral P2 segments and distal PCAs without high-grade stenosis. Bilateral posterior communicating arteries noted, right larger than left. Venous sinuses: Patent. Anatomic variants: Hypoplastic left A1 segment. Dominant left vertebral artery. No intracranial aneurysm. Delayed phase: Not performed. Review of the MIP images confirms the above findings IMPRESSION: 1. 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 Finding suspicious for near occlusive/subocclusive thrombus. Underlying stenosis may be contributory as well. 2. Irregular approximate 50% moderate mid right M1 stenosis. 3. Mild distal small vessel atheromatous irregularity. Critical Value/emergent results were called by telephone at the time of interpretation on 12/17/2017 at 11:35 pm to Dr. Arther Dames , who verbally acknowledged these results. Electronically Signed   By: Rise Mu M.D.   On: 12/18/2017 00:07   Ct Angio Neck W Or Wo Contrast  Result Date: 12/18/2017 CLINICAL DATA:  Initial evaluation for acute left-sided deficits. EXAM: CT ANGIOGRAPHY HEAD AND NECK TECHNIQUE: Multidetector CT imaging of the head and neck was performed using the standard protocol during bolus administration of intravenous contrast. Multiplanar CT image reconstructions and MIPs were obtained to evaluate  the vascular anatomy. Carotid stenosis measurements (when applicable) are obtained utilizing NASCET criteria, using the distal internal carotid diameter as the denominator. CONTRAST:  50 cc of Isovue 370. COMPARISON:  Prior CT from earlier the same day.  FINDINGS: CTA NECK FINDINGS Aortic arch: Visualized aortic arch of normal caliber with normal branch pattern. No high-grade stenosis about the origin of the great vessels. Visualized subclavian arteries widely patent. Right carotid system: Right common carotid artery widely patent to the bifurcation without stenosis. Right ICA widely patent from the bifurcation to the skull base without stenosis, dissection, or occlusion. Medial station of the right carotid artery system into the retropharyngeal space noted. No significant atheromatous narrowing about the right carotid bifurcation. Left carotid system: Left common and internal carotid arteries are widely patent without stenosis, dissection, or occlusion. No significant atheromatous narrowing about the left carotid bifurcation. Vertebral arteries: Both of the vertebral arteries arise from the subclavian arteries. Left vertebral artery dominant with a diffusely hypoplastic right vertebral artery. Vertebral arteries widely patent within the neck without stenosis, dissection, or occlusion. Skeleton: No acute osseus abnormality. No worrisome lytic or blastic osseous lesions. Other neck: No acute soft tissue abnormality within the neck. Salivary glands normal. Thyroid within normal limits. No adenopathy. Upper chest: Visualized upper chest within normal limits. Visualized upper lungs are grossly clear. Few tiny tree-in-bud nodular densities noted at the medial left upper lobe, favored to reflect mucoid impaction, of doubtful significance. Review of the MIP images confirms the above findings CTA HEAD FINDINGS Anterior circulation: Petrous, cavernous, and supraclinoid segments of the internal carotid arteries are widely patent without flow-limiting stenosis. ICA termini widely patent. Dominant right A1 segment widely patent. Left A1 segment diffusely hypoplastic, accounting for the slightly diminutive left ICA is compared to the right. Widely patent and normal anterior  communicating artery. Anterior cerebral arteries well perfused to their distal aspects without stenosis. Focal irregularity with mild to moderate stenosis noted at the mid right M1 segment (series 8, image 101). Right M1 patent distally. There is abrupt near occlusion of a proximal right M2 branch, middle division (series 11, image 20). Right M2 immediately branches just distally, with markedly thready and irregular flow within the M3 and M4 segments distally (series 8, image 90). Finding suspicious for possible subocclusive thrombus. A superimposed high-grade stenosis may be contributory as well. Overall, there is decreased perfusion with poor collateralization within the distal right MCA territory as compared to the left (series 11, image 14). Left M1 segment widely patent without stenosis or occlusion. Normal left MCA bifurcation. No proximal left M2 occlusion. Distal left MCA branches well perfused. Mild distal small vessel atheromatous irregularity. Posterior circulation: Vertebral arteries patent to the vertebrobasilar junction without stenosis. Left vertebral artery dominant. Patent left PICA. Right PICA not well visualized. Basilar artery widely patent to its distal aspect. Superior cerebral arteries patent bilaterally. Both of the posterior cerebral arteries primarily supplied via the basilar. Short-segment mild to moderate proximal left P1 stenosis. Mild atheromatous irregularity within the bilateral P2 segments and distal PCAs without high-grade stenosis. Bilateral posterior communicating arteries noted, right larger than left. Venous sinuses: Patent. Anatomic variants: Hypoplastic left A1 segment. Dominant left vertebral artery. No intracranial aneurysm. Delayed phase: Not performed. Review of the MIP images confirms the above findings IMPRESSION: 1. 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 Finding suspicious for near occlusive/subocclusive  thrombus. Underlying stenosis may be contributory as well. 2. Irregular approximate 50% moderate mid right M1 stenosis. 3. Mild  distal small vessel atheromatous irregularity. Critical Value/emergent results were called by telephone at the time of interpretation on 12/17/2017 at 11:35 pm to Dr. Arther Dames , who verbally acknowledged these results. Electronically Signed   By: Rise Mu M.D.   On: 12/18/2017 00:07   Ct Head Code Stroke Wo Contrast  Result Date: 12/17/2017 CLINICAL DATA:  Code stroke. Initial evaluation for acute left-sided deficits. EXAM: CT HEAD WITHOUT CONTRAST TECHNIQUE: Contiguous axial images were obtained from the base of the skull through the vertex without intravenous contrast. COMPARISON:  Prior CT from 09/01/2013. FINDINGS: Brain: Cerebral volume within normal limits for age. No acute intracranial hemorrhage. No acute large vessel territory infarct. Tiny remote right basal ganglia lacunar infarct noted. No mass lesion, midline shift or mass effect. No hydrocephalus. No extra-axial fluid collection. Vascular: No hyperdense vessel. Scattered vascular calcifications noted within the carotid siphons. Skull: Scalp soft tissues and calvarium demonstrate no acute abnormality. Sinuses/Orbits: Globes and orbital soft tissues within normal limits. Partial gas fixation of the lateral recess of the right sphenoid sinus noted. Paranasal sinuses are otherwise clear. No mastoid effusion. Other: None. ASPECTS [facility] Stroke Program Early CT Score) - Ganglionic level infarction (caudate, lentiform nuclei, internal capsule, insula, M1-M3 cortex): 7 - Supraganglionic infarction (M4-M6 cortex): 3 Total score (0-10 with 10 being normal): 10 IMPRESSION: 1. No acute intracranial infarct or other process identified. 2. ASPECTS is 10. 3. Tiny remote right basal ganglia lacunar infarct. These results were communicated to [place] at 11:11 pmon 3/17/2019by text page via the [facility] messaging system.  Electronically Signed   By: Rise Mu M.D.   On: 12/17/2017 23:14     ASSESSMENT AND PLAN   62 y.o. female Hispanic with PMH of HTN, obesity presents with dysarthria, left side weakness and extinction. CTA shows m2 occlusion and underwent MT and stenting.  Received brillinta load for intracranial stent. Post procedure CT scan shows no hemorrhage.    Acute Ischemic Stroke - Right MCA  Recommend # MRI of the brain without contrast #Transthoracic Echo  # Brillinta load and maintenance dosing per IR team #Start or continue Atorvastatin 80 mg/other high intensity statin # BP goal: 120-140 systolic per IR team  # HBAIC and Lipid profile # Telemetry monitoring # Frequent neuro checks # NPO until passes stroke swallow screen  HTN BP goal per IR team 120-140 systolic Cardene gtt started  Hyperglycemia AIC pending   DVT PPX SCD Diet: NPO  Please page stroke NP  Or  PA  Or MD from 8am -4 pm  as this patient from this time will be  followed by the stroke.   You can look them up on [email or web address]  Password [email or web address]     This patient is neurologically critically ill due to right MCA stroke with LVO.   She is at risk for significant risk of neurological worsening from cerebral edema,  death from brain herniation, heart failure, hemorrhagic conversion, infection, respiratory failure and seizure. This patient's care requires constant monitoring of vital signs, hemodynamics, respiratory and cardiac monitoring, review of multiple databases, neurological assessment, discussion with family, other specialists and medical decision making of high complexity.  I spent 60  minutes of neurocritical time in the care of this patient.    Sushanth Aroor [facility] Pager Number [phone]

## 2017-12-17 NOTE — ED Provider Notes (Addendum)
[name] EMERGENCY DEPARTMENT Provider Note   CSN: [id number] Arrival date & time: 12/17/17  2250   An emergency department physician performed an initial assessment on this suspected stroke patient at 2254.  History   Chief Complaint Chief Complaint  Patient presents with  . Code Stroke    HPI Heather [name] is a 62 y.o. female.  Patient is a 62 year old Hispanic female with no significant past medical history.  She presents today for evaluation of right-sided headache, left facial droop, and left arm weakness.  According to family, she was last seen normal at approximately 630 this evening.  She denies any specific injury or trauma.  She denies any recent illness.  Of note is that this patient does not speak [place], only [place].  History was taken with the assistance of the family who was present at bedside and acts as Nurse, learning disability.   The history is provided by the patient.    Past Medical History:  Diagnosis Date  . Hypertension     There are no active problems to display for this patient.   Past Surgical History:  Procedure Laterality Date  . BREAST EXCISIONAL BIOPSY    . BREAST SURGERY     right breast  . CHOLECYSTECTOMY      OB History    Gravida Para Term Preterm AB Living   1         1   SAB TAB Ectopic Multiple Live Births           1       Home Medications    Prior to Admission medications   Medication Sig Start Date End Date Taking? Authorizing Provider  albuterol (PROVENTIL HFA;VENTOLIN HFA) 108 (90 Base) MCG/ACT inhaler Inhale 2 puffs into the lungs every 6 (six) hours as needed for wheezing or shortness of breath. 09/12/16   Cherlynn Perches, MD  aspirin EC 81 MG tablet Take 81 mg by mouth as needed.    [provider]  ibuprofen (ADVIL,MOTRIN) 200 MG tablet Take 200 mg by mouth every 6 (six) hours as needed.    [provider]  meclizine (ANTIVERT) 32 MG tablet Take 1 tablet (32 mg total) by mouth 3 (three) times  daily as needed. 09/05/16   Lizbeth Bark, FNP  naproxen (NAPROSYN) 250 MG tablet Take 1 tablet (250 mg total) by mouth 2 (two) times daily with a meal. 10/19/17   Little, Ambrose Finland, MD  promethazine (PHENERGAN) 25 MG tablet Take 1 tablet (25 mg total) by mouth every 6 (six) hours as needed for nausea or vomiting. 10/19/17   Little, Ambrose Finland, MD  ranitidine (ZANTAC) 150 MG tablet Take 1 tablet (150 mg total) by mouth 2 (two) times daily. 09/05/16   Lizbeth Bark, FNP    Family History Family History  Problem Relation Age of Onset  . Diabetes Mother   . Pulmonary disease Mother   . Heart disease Father     Social History Social History   Tobacco Use  . Smoking status: Current Every Day Smoker    Packs/day: 0.50    Years: 30.00    Pack years: 15.00    Types: Cigarettes  . Smokeless tobacco: Never Used  Substance Use Topics  . Alcohol use: Yes    Comment: weekly  . Drug use: No     Allergies   Patient has no known allergies.   Review of Systems Review of Systems  All other systems reviewed and are negative.  Physical Exam Updated Vital Signs BP (!) 157/112   Pulse 86   Temp 98.8 F (37.1 C) (Oral)   Resp 16   Wt 120.3 kg (265 lb 3.4 oz)   SpO2 99%   BMI 40.33 kg/m   Physical Exam  Constitutional: She is oriented to person, place, and time. She appears well-developed and well-nourished. No distress.  HENT:  Head: Normocephalic and atraumatic.  Neck: Normal range of motion. Neck supple.  Cardiovascular: Normal rate and regular rhythm. Exam reveals no gallop and no friction rub.  No murmur heard. Pulmonary/Chest: Effort normal and breath sounds normal. No respiratory distress. She has no wheezes.  Abdominal: Soft. Bowel sounds are normal. She exhibits no distension. There is no tenderness.  Musculoskeletal: Normal range of motion.  Neurological: She is alert and oriented to person, place, and time. A cranial nerve deficit is present. She  exhibits abnormal muscle tone.  There is a left-sided facial droop noted.  Strength is 5 out of 5 in the right upper and lower extremity and left lower extremity.  Hand grip and bicep flexion is reduced at 4 out of 5 on the left.  Skin: Skin is warm and dry. She is not diaphoretic.  Nursing note and vitals reviewed.    ED Treatments / Results  Labs (all labs ordered are listed, but only abnormal results are displayed) Labs Reviewed  COMPREHENSIVE METABOLIC PANEL - Abnormal; Notable for the following components:      Result Value   Glucose, Bld 204 (*)    All other components within normal limits  CBG MONITORING, ED - Abnormal; Notable for the following components:   Glucose-Capillary 184 (*)    All other components within normal limits  I-STAT CHEM 8, ED - Abnormal; Notable for the following components:   Glucose, Bld 202 (*)    All other components within normal limits  PROTIME-INR  APTT  CBC  DIFFERENTIAL  I-STAT TROPONIN, ED    EKG  EKG Interpretation None       Radiology Ct Head Code Stroke Wo Contrast  Result Date: 12/17/2017 CLINICAL DATA:  Code stroke. Initial evaluation for acute left-sided deficits. EXAM: CT HEAD WITHOUT CONTRAST TECHNIQUE: Contiguous axial images were obtained from the base of the skull through the vertex without intravenous contrast. COMPARISON:  Prior CT from 09/01/2013. FINDINGS: Brain: Cerebral volume within normal limits for age. No acute intracranial hemorrhage. No acute large vessel territory infarct. Tiny remote right basal ganglia lacunar infarct noted. No mass lesion, midline shift or mass effect. No hydrocephalus. No extra-axial fluid collection. Vascular: No hyperdense vessel. Scattered vascular calcifications noted within the carotid siphons. Skull: Scalp soft tissues and calvarium demonstrate no acute abnormality. Sinuses/Orbits: Globes and orbital soft tissues within normal limits. Partial gas fixation of the lateral recess of the right  sphenoid sinus noted. Paranasal sinuses are otherwise clear. No mastoid effusion. Other: None. ASPECTS [facility] Stroke Program Early CT Score) - Ganglionic level infarction (caudate, lentiform nuclei, internal capsule, insula, M1-M3 cortex): 7 - Supraganglionic infarction (M4-M6 cortex): 3 Total score (0-10 with 10 being normal): 10 IMPRESSION: 1. No acute intracranial infarct or other process identified. 2. ASPECTS is 10. 3. Tiny remote right basal ganglia lacunar infarct. These results were communicated to [place] at 11:11 pmon 3/17/2019by text page via the [facility] messaging system. Electronically Signed   By: Rise Mu M.D.   On: 12/17/2017 23:14    Procedures Procedures (including critical care time)  Medications Ordered in ED Medications  iopamidol (ISOVUE-370)  76 % injection (not administered)  iopamidol (ISOVUE-370) 76 % injection 50 mL (not administered)     Initial Impression / Assessment and Plan / ED Course  I have reviewed the triage vital signs and the nursing notes.  Pertinent labs & imaging results that were available during my care of the patient were reviewed by me and considered in my medical decision making (see chart for details).  Patient arrives here as a code stroke after experiencing garbled speech, left-sided facial droop, and left arm weakness at approximately 630 this evening.  She was evaluated immediately by neurology.  CT angio of the head and neck does reveal evidence of an M1 occlusion. She will go to IR for intervention at the recommendation of Dr. Laurence Slate.  CRITICAL CARE Performed by: Geoffery Lyons Total critical care time: 35 minutes Critical care time was exclusive of separately billable procedures and treating other patients. Critical care was necessary to treat or prevent imminent or life-threatening deterioration. Critical care was time spent personally by me on the following activities: development of treatment plan with patient and/or surrogate as  well as nursing, discussions with consultants, evaluation of patient's response to treatment, examination of patient, obtaining history from patient or surrogate, ordering and performing treatments and interventions, ordering and review of laboratory studies, ordering and review of radiographic studies, pulse oximetry and re-evaluation of patient's condition.   Final Clinical Impressions(s) / ED Diagnoses   Final diagnoses:  None    ED Discharge Orders    None       Geoffery Lyons, MD 12/18/17 0000    Geoffery Lyons, MD 12/28/17 [phone]

## 2017-12-17 NOTE — ED Notes (Addendum)
Upon further assessment by the neurologist and Dominic Pea RN as interpreter, pt now states that she had headache that started at 1830 and that is when the left arm weakness started. No TPA per the Neurologist. Pt to get CTA

## 2017-12-18 ENCOUNTER — Inpatient Hospital Stay (HOSPITAL_COMMUNITY): Payer: Medicaid Other

## 2017-12-18 ENCOUNTER — Encounter (HOSPITAL_COMMUNITY): Payer: Self-pay | Admitting: Anesthesiology

## 2017-12-18 ENCOUNTER — Encounter (HOSPITAL_COMMUNITY)
Admission: EM | Disposition: A | Discharge status: Rehabilitation Facility | Payer: Self-pay | Source: Home / Self Care | Attending: Neurology

## 2017-12-18 ENCOUNTER — Inpatient Hospital Stay (HOSPITAL_COMMUNITY): Payer: Medicaid Other | Admitting: Anesthesiology

## 2017-12-18 ENCOUNTER — Other Ambulatory Visit (HOSPITAL_COMMUNITY): Payer: Self-pay

## 2017-12-18 DIAGNOSIS — I6601 Occlusion and stenosis of right middle cerebral artery: Secondary | ICD-10-CM | POA: Diagnosis present

## 2017-12-18 HISTORY — PX: RADIOLOGY WITH ANESTHESIA: SHX6223

## 2017-12-18 HISTORY — PX: IR INTRA CRAN STENT: IMG2345

## 2017-12-18 HISTORY — PX: IR CT HEAD LTD: IMG2386

## 2017-12-18 HISTORY — PX: IR PERCUTANEOUS ART THROMBECTOMY/INFUSION INTRACRANIAL INC DIAG ANGIO: IMG6087

## 2017-12-18 LAB — CBC WITH DIFFERENTIAL/PLATELET
BASOS ABS: 0 10*3/uL (ref 0.0–0.1)
Basophils Relative: 0 %
EOS ABS: 0.1 10*3/uL (ref 0.0–0.7)
EOS PCT: 1 %
HCT: 37.6 % (ref 36.0–46.0)
Hemoglobin: 12.3 g/dL (ref 12.0–15.0)
Lymphocytes Relative: 37 %
Lymphs Abs: 3.6 10*3/uL (ref 0.7–4.0)
MCH: 31.1 pg (ref 26.0–34.0)
MCHC: 32.7 g/dL (ref 30.0–36.0)
MCV: 94.9 fL (ref 78.0–100.0)
MONO ABS: 0.4 10*3/uL (ref 0.1–1.0)
Monocytes Relative: 4 %
Neutro Abs: 5.7 10*3/uL (ref 1.7–7.7)
Neutrophils Relative %: 58 %
PLATELETS: 226 10*3/uL (ref 150–400)
RBC: 3.96 MIL/uL (ref 3.87–5.11)
RDW: 13.3 % (ref 11.5–15.5)
WBC: 9.8 10*3/uL (ref 4.0–10.5)

## 2017-12-18 LAB — BASIC METABOLIC PANEL
ANION GAP: 8 (ref 5–15)
BUN: 10 mg/dL (ref 6–20)
CALCIUM: 7.7 mg/dL — AB (ref 8.9–10.3)
CO2: 21 mmol/L — ABNORMAL LOW (ref 22–32)
Chloride: 110 mmol/L (ref 101–111)
Creatinine, Ser: 0.54 mg/dL (ref 0.44–1.00)
GFR calc Af Amer: >60 mL/min (ref >60)
GLUCOSE: 214 mg/dL — AB (ref 65–99)
Potassium: 4.4 mmol/L (ref 3.5–5.1)
Sodium: 139 mmol/L (ref 135–145)

## 2017-12-18 LAB — HEMOGLOBIN A1C
Hgb A1c MFr Bld: 6.7 % — ABNORMAL HIGH (ref 4.8–5.6)
MEAN PLASMA GLUCOSE: 145.59 mg/dL

## 2017-12-18 LAB — MRSA PCR SCREENING: MRSA by PCR: NEGATIVE

## 2017-12-18 LAB — LIPID PANEL
CHOL/HDL RATIO: 3.1 ratio
Cholesterol: 115 mg/dL (ref 0–200)
HDL: 37 mg/dL — ABNORMAL LOW (ref >40)
LDL Cholesterol: 67 mg/dL (ref 0–99)
Triglycerides: 55 mg/dL (ref <150)
VLDL: 11 mg/dL (ref 0–40)

## 2017-12-18 SURGERY — RADIOLOGY WITH ANESTHESIA
Anesthesia: General | Site: Head

## 2017-12-18 MED ORDER — SUGAMMADEX SODIUM 500 MG/5ML IV SOLN
INTRAVENOUS | Status: DC | PRN
  Administered 2017-12-18: 300 mg via INTRAVENOUS

## 2017-12-18 MED ORDER — CLEVIDIPINE BUTYRATE 0.5 MG/ML IV EMUL
0.0000 mg/h | INTRAVENOUS | Status: DC
  Administered 2017-12-18: 21 mg/h via INTRAVENOUS
  Administered 2017-12-18: 1 mg/h via INTRAVENOUS
  Administered 2017-12-18 – 2017-12-19 (×7): 21 mg/h via INTRAVENOUS
  Filled 2017-12-18 (×4): qty 50
  Filled 2017-12-18: qty 100
  Filled 2017-12-18 (×3): qty 50

## 2017-12-18 MED ORDER — ONDANSETRON HCL 4 MG/2ML IJ SOLN
4.0000 mg | Freq: Once | INTRAMUSCULAR | Status: AC
  Administered 2017-12-18: 4 mg via INTRAVENOUS
  Filled 2017-12-18: qty 2

## 2017-12-18 MED ORDER — NITROGLYCERIN 1 MG/10 ML FOR IR/CATH LAB
INTRA_ARTERIAL | Status: AC
  Administered 2017-12-18: 25 ug
  Filled 2017-12-18: qty 10

## 2017-12-18 MED ORDER — ACETAMINOPHEN 160 MG/5ML PO SOLN: 650.0000 mg | ORAL | Status: DC | PRN

## 2017-12-18 MED ORDER — EPTIFIBATIDE 20 MG/10ML IV SOLN
INTRAVENOUS | Status: AC
  Administered 2017-12-18 (×8): 1.5 mg
  Filled 2017-12-18: qty 10

## 2017-12-18 MED ORDER — SODIUM CHLORIDE 0.9 % IV SOLN
INTRAVENOUS | Status: DC | PRN
  Administered 2017-12-18: via INTRAVENOUS

## 2017-12-18 MED ORDER — IOPAMIDOL (ISOVUE-300) INJECTION 61%
INTRAVENOUS | Status: AC
  Administered 2017-12-18: 210 mL
  Filled 2017-12-18: qty 100

## 2017-12-18 MED ORDER — ACETAMINOPHEN 325 MG PO TABS
650.0000 mg | ORAL_TABLET | ORAL | Status: DC | PRN
  Administered 2017-12-18: 650 mg via ORAL
  Filled 2017-12-18: qty 2

## 2017-12-18 MED ORDER — LIDOCAINE HCL (CARDIAC) 20 MG/ML IV SOLN
INTRAVENOUS | Status: DC | PRN
  Administered 2017-12-18: 80 mg via INTRATRACHEAL

## 2017-12-18 MED ORDER — IOPAMIDOL (ISOVUE-300) INJECTION 61%
INTRAVENOUS | Status: AC
  Filled 2017-12-18: qty 300

## 2017-12-18 MED ORDER — FENTANYL CITRATE (PF) 100 MCG/2ML IJ SOLN
INTRAMUSCULAR | Status: AC
  Filled 2017-12-18: qty 2

## 2017-12-18 MED ORDER — NICARDIPINE HCL IN NACL 20-0.86 MG/200ML-% IV SOLN
0.0000 mg/h | INTRAVENOUS | Status: DC
  Administered 2017-12-18 (×5): 15 mg/h via INTRAVENOUS
  Administered 2017-12-18: 5 mg/h via INTRAVENOUS
  Administered 2017-12-18: 12.5 mg/h via INTRAVENOUS
  Filled 2017-12-18: qty 800
  Filled 2017-12-18 (×2): qty 200
  Filled 2017-12-18: qty 400
  Filled 2017-12-18: qty 200
  Filled 2017-12-18: qty 400

## 2017-12-18 MED ORDER — FENTANYL CITRATE (PF) 100 MCG/2ML IJ SOLN
25.0000 ug | INTRAMUSCULAR | Status: DC | PRN
  Filled 2017-12-18: qty 2

## 2017-12-18 MED ORDER — SODIUM CHLORIDE 0.9 % IJ SOLN
1.5000 mg | INTRAVENOUS | Status: AC
  Administered 2017-12-18 (×2): 25 ug via INTRA_ARTERIAL

## 2017-12-18 MED ORDER — LIDOCAINE HCL 1 % IJ SOLN
INTRAMUSCULAR | Status: AC
  Filled 2017-12-18: qty 20

## 2017-12-18 MED ORDER — ACETAMINOPHEN 650 MG RE SUPP: 650.0000 mg | RECTAL | Status: DC | PRN

## 2017-12-18 MED ORDER — TICAGRELOR 90 MG PO TABS
ORAL_TABLET | ORAL | Status: AC
  Administered 2017-12-18: 180 mg
  Filled 2017-12-18: qty 2

## 2017-12-18 MED ORDER — FENTANYL CITRATE (PF) 100 MCG/2ML IJ SOLN
INTRAMUSCULAR | Status: DC | PRN
  Administered 2017-12-18: 100 ug via INTRAVENOUS
  Administered 2017-12-18 (×2): 50 ug via INTRAVENOUS

## 2017-12-18 MED ORDER — METOCLOPRAMIDE HCL 5 MG/ML IJ SOLN
10.0000 mg | Freq: Once | INTRAMUSCULAR | Status: AC
  Administered 2017-12-18: 10 mg via INTRAVENOUS
  Filled 2017-12-18: qty 2

## 2017-12-18 MED ORDER — ONDANSETRON HCL 4 MG/2ML IJ SOLN
INTRAMUSCULAR | Status: DC | PRN
  Administered 2017-12-18: 4 mg via INTRAVENOUS

## 2017-12-18 MED ORDER — PROPOFOL 10 MG/ML IV BOLUS
INTRAVENOUS | Status: DC | PRN
  Administered 2017-12-18: 30 mg via INTRAVENOUS
  Administered 2017-12-18: 20 mg via INTRAVENOUS
  Administered 2017-12-18: 100 mg via INTRAVENOUS

## 2017-12-18 MED ORDER — ROCURONIUM BROMIDE 100 MG/10ML IV SOLN
INTRAVENOUS | Status: DC | PRN
  Administered 2017-12-18: 50 mg via INTRAVENOUS
  Administered 2017-12-18: 20 mg via INTRAVENOUS

## 2017-12-18 MED ORDER — ASPIRIN 325 MG PO TABS
ORAL_TABLET | ORAL | Status: AC
  Filled 2017-12-18: qty 1

## 2017-12-18 MED ORDER — TICAGRELOR 90 MG PO TABS
90.0000 mg | ORAL_TABLET | Freq: Two times a day (BID) | ORAL | Status: DC
  Filled 2017-12-18 (×4): qty 1

## 2017-12-18 MED ORDER — TICAGRELOR 90 MG PO TABS
90.0000 mg | ORAL_TABLET | Freq: Two times a day (BID) | ORAL | Status: DC
  Administered 2017-12-18 – 2017-12-19 (×3): 90 mg via ORAL
  Filled 2017-12-18 (×4): qty 1

## 2017-12-18 MED ORDER — ASPIRIN 81 MG PO CHEW: 81.0000 mg | CHEWABLE_TABLET | Freq: Every day | ORAL | Status: DC

## 2017-12-18 MED ORDER — SUCCINYLCHOLINE CHLORIDE 20 MG/ML IJ SOLN
INTRAMUSCULAR | Status: DC | PRN
  Administered 2017-12-18: 90 mg via INTRAVENOUS

## 2017-12-18 MED ORDER — CLOPIDOGREL BISULFATE 300 MG PO TABS
ORAL_TABLET | ORAL | Status: AC
  Filled 2017-12-18: qty 1

## 2017-12-18 MED ORDER — MORPHINE SULFATE (PF) 4 MG/ML IV SOLN
1.0000 mg | Freq: Once | INTRAVENOUS | Status: AC
  Administered 2017-12-18: 1 mg via INTRAVENOUS
  Filled 2017-12-18: qty 1

## 2017-12-18 MED ORDER — CEFAZOLIN SODIUM-DEXTROSE 2-3 GM-%(50ML) IV SOLR
INTRAVENOUS | Status: DC | PRN
  Administered 2017-12-18: 2 g via INTRAVENOUS

## 2017-12-18 MED ORDER — ESMOLOL HCL 100 MG/10ML IV SOLN
INTRAVENOUS | Status: DC | PRN
  Administered 2017-12-18 (×2): 50 mg via INTRAVENOUS

## 2017-12-18 MED ORDER — ASPIRIN 81 MG PO CHEW
81.0000 mg | CHEWABLE_TABLET | Freq: Every day | ORAL | Status: DC
  Administered 2017-12-19: 81 mg via ORAL
  Filled 2017-12-18: qty 1

## 2017-12-18 MED ORDER — TIROFIBAN HCL IN NACL 5-0.9 MG/100ML-% IV SOLN
INTRAVENOUS | Status: AC
  Filled 2017-12-18: qty 100

## 2017-12-18 MED ORDER — SODIUM CHLORIDE 0.9 % IV SOLN
INTRAVENOUS | Status: DC
  Administered 2017-12-18: 05:00:00 via INTRAVENOUS

## 2017-12-18 MED ORDER — CEFAZOLIN SODIUM-DEXTROSE 2-4 GM/100ML-% IV SOLN
INTRAVENOUS | Status: AC
  Filled 2017-12-18: qty 100

## 2017-12-18 MED ORDER — LABETALOL HCL 5 MG/ML IV SOLN
INTRAVENOUS | Status: DC | PRN
  Administered 2017-12-18 (×2): 10 mg via INTRAVENOUS
  Administered 2017-12-18: 5 mg via INTRAVENOUS
  Administered 2017-12-18: 10 mg via INTRAVENOUS
  Administered 2017-12-18: 5 mg via INTRAVENOUS
  Administered 2017-12-18 (×2): 10 mg via INTRAVENOUS

## 2017-12-18 NOTE — Progress Notes (Signed)
STROKE TEAM PROGRESS NOTE   HISTORY OF PRESENT ILLNESS (per record) Heather [name] is an 62 y.o. female Hispanic with PMH of HTN,Obesity who presents [facility] emergency room as a stroke alert with left hemiparesis. Patient is Spanish-speaking and initially EMS was told that her symptoms began at 9 PM after she dropped a glass in her left hand. CT head showed no hemorrhage and tPA was mixed. However after nurse spoke to her in spanish, patient stated that she had a headache that began around 6:30pm and had left side weakness as she was  outside window period upon completion of CT scan.   CTA head and neck was performed which showed a superior division M2 cutoff. After discussing with family given her borderline NIHSS of 7, however worsening symptoms ( from EMS assessment to arrival at hospital)  we decided to take the patient for mechanical thrombectomy.  Date last known well:3.17.19 Time last known well: around  6.30 pm tPA Given: no, outside window at time of CT scan NIHSS: 7 ( at time of arrival)  Baseline MRS 0     SUBJECTIVE (INTERVAL HISTORY) The patient's son is at the bedside.  Patient does not speak [place].  Her son acts as an Equities trader.  She notes a headache and some nausea.  The patient is oriented and follows commands.    OBJECTIVE Temp:  [97.2 F (36.2 C)-98.8 F (37.1 C)] 98.2 F (36.8 C) (03/18 1200) Pulse Rate:  [71-101] 87 (03/18 1200) Cardiac Rhythm: Normal sinus rhythm (03/18 1200) Resp:  [11-25] 15 (03/18 1200) BP: (105-176)/(61-114) 120/69 (03/18 1200) SpO2:  [98 %-100 %] 100 % (03/18 1200) Arterial Line BP: (126-158)/(49-74) 137/64 (03/18 1200) Weight:  [265 lb 3.4 oz (120.3 kg)] 265 lb 3.4 oz (120.3 kg) (03/17 2301)  CBC:  Recent Labs  Lab 12/17/17 2255 12/17/17 2300 12/18/17 0405  WBC 8.0  --  9.8  NEUTROABS 4.3  --  5.7  HGB 13.1 14.3 12.3  HCT 40.9 42.0 37.6  MCV 94.2  --  94.9  PLT 247  --  226    Basic Metabolic Panel:  Recent Labs  Lab  12/17/17 2255 12/17/17 2300 12/18/17 0405  NA 138 142 139  K 4.1 4.1 4.4  CL 105 104 110  CO2 25  --  21*  GLUCOSE 204* 202* 214*  BUN 13 15 10   CREATININE 0.57 0.50 0.54  CALCIUM 9.0  --  7.7*    Lipid Panel:     Component Value Date/Time   CHOL 115 12/18/2017 0405   TRIG 55 12/18/2017 0405   HDL 37 (L) 12/18/2017 0405   CHOLHDL 3.1 12/18/2017 0405   VLDL 11 12/18/2017 0405   LDLCALC 67 12/18/2017 0405   HgbA1c:  Lab Results  Component Value Date   HGBA1C 6.7 (H) 12/18/2017   Urine Drug Screen: No results found for: LABOPIA, COCAINSCRNUR, LABBENZ, AMPHETMU, THCU, LABBARB  Alcohol Level No results found for: ETH  IMAGING   Ct Angio Head W Or Wo Contrast Ct Angio Neck W Or Wo Contrast 12/18/2017 IMPRESSION:  1. 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 Finding suspicious for near occlusive/subocclusive thrombus. Underlying stenosis may be contributory as well.  2. Irregular approximate 50% moderate mid right M1 stenosis.  3. Mild distal small vessel atheromatous irregularity.    Ct Head Code Stroke Wo Contrast 12/17/2017 IMPRESSION:  1. No acute intracranial infarct or other process identified.  2. ASPECTS is 10.  3. Tiny  remote right basal ganglia lacunar infarct.    MRI Brain Wo Contrast - pending      Rt MCA.and ICAD stenosis of mid M1 - Dr Corliss Skains 12/18/2017 S/P rt common carotid arteriogram followed by complete revascularization of occluded the dominant inf division  Achieving a TICI 3 reperfusion , followed by rescue stenting of progressive occlusion of inf division due underlying stenosis  Caused by athersclerotic plaque with a TICI 3 reperfusion. Meds. 12 mg od superselective intracranial intraarterial  Integrelin ,and loading dose of 180 mg of brilinta po and 81 mg of aspirin po.    Transthoracic Echocardiogram - pending 00/00/00    PHYSICAL EXAM Vitals:   12/18/17 0930 12/18/17  1000 12/18/17 1100 12/18/17 1200  BP: 110/75 115/75 110/63 120/69  Pulse: 80 82 (!) 101 87  Resp: (!) 25 17 (!) 22 15  Temp:    98.2 F (36.8 C)  TempSrc:    Oral  SpO2: 100% 100% 100% 100%  Weight:       Pleasant middle-age lady not in distress. . Afebrile. Head is nontraumatic. Neck is supple without bruit.    Cardiac exam no murmur or gallop. Lungs are clear to auscultation. Distal pulses are well felt.  Neurological exam Drowsy but awakens easily. Right gaze preference and unable to cross the midline to look to the left. Pupils equal reactive. Fundi not visualized. Blinks to threat on the right but not the left. Left lower facial weakness mild dysarthria but can be understood. Mild left-sided neglect. Tongue midline. Left hemiplegia with left upper extremity 3/5 with drift. Left lower extremity 4/5 strength. Sensation appears slightly diminished on the left compared to the right. Tone diminished on the left compared to the right. Left plantar upgoing right downgoing. Gait not tested. -NIH stroke scale score 7      ASSESSMENT/PLAN [name] is a 62 y.o. female with history of hypertension, ongoing tobacco use and obesity presenting with left sided weakness. She did not receive IV t-PA due to late presentation. S/P thrombectomy / stent Rt MCA.  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 Tiny remote right basal ganglia lacunar infarct.   MRI head - pending  MRA head - not performed  CTA H&N - Abrupt proximal right M2 occlusion/near occlusion.  Carotid Doppler - CTA neck  2D Echo - pending  LDL - 67  HgbA1c - 6.7  VTE prophylaxis - SCDs Fall precautions Diet NPO time specified  aspirin 81 mg daily prior to admission, now on aspirin 81 mg daily and Brilinta 90 mg BID  Patient counseled  to be compliant with her antithrombotic medications  Ongoing aggressive stroke risk factor management  Therapy recommendations:  pending  Disposition:  Pending  Hypertension  Blood pressure tends to run low  Permissive hypertension (OK if < 220/120) but gradually normalize in 5-7 days  Long-term BP goal normotensive  Hyperlipidemia  Lipid lowering medication PTA:  none  LDL 67, goal < 70  Consider low-dose Lipitor when taking POs  Continue statin at discharge   Other Stroke Risk Factors  Advanced age  Cigarette smoker - advised to stop smoking  ETOH use, advised to drink no more than 1 drink per day.  Obesity, Body mass index is 40.33 kg/m., recommend weight loss, diet and exercise as appropriate   Hx stroke/TIA (byimaging)  Other Active Problems  Headache - Morphine per Dr Juliane Lack Spanish - no English   Plan / Recommendations   Stroke workup -await echo and MRI  Check labs in AM   Hospital day # 1  Delton See PA-C [facility] Pager [phone] 12/18/2017, 12:21 PM I have personally examined this patient, reviewed notes, independently viewed imaging studies, participated in medical decision making and plan of care.ROS completed by me personally and pertinent positives fully documented  I have made any additions or clarifications directly to the above note. Agree with note above. She presented with right MCA infarct due to right middle cerebral artery stenosis and underwent emergent mechanical thrombectomy with rescue right MCA stenting. She is extubated but still has persistent left hemiplegia and right gaze preference. I recommend strict blood pressure control and close neurological monitoring. She'll need aspirin and Brilinta due to her stenting.Check MRI scan later today. Symptomatic management of headache. Long discussion with the patient and son at the bedside and answered questions. Discussed with Dr. Corliss Skains and with Dr.  Warren Lacy. This patient is critically ill and at significant risk of neurological worsening, death and care requires constant monitoring of vital signs, hemodynamics,respiratory and cardiac monitoring, extensive review of multiple databases, frequent neurological assessment, discussion with family, other specialists and medical decision making of high complexity.I have made any additions or clarifications directly to the above note.This critical care time does not reflect procedure time, or teaching time or supervisory time of PA/NP/Med Resident etc but could involve care discussion time.  I spent 40 minutes of neurocritical care time  in the care of  this patient.      Delia Heady, MD Medical Director [facility] Pager: [phone] 12/18/2017 4:56 PM   To contact Stroke Continuity provider, please refer to [email or web address]. After hours, contact General Neurology

## 2017-12-18 NOTE — Anesthesia Preprocedure Evaluation (Addendum)
Anesthesia Evaluation  Patient identified by MRN, date of birth, ID bandGeneral Assessment Comment:Minimal ability communication given language barrier, appeared to follow commands, no interpreter immediately available   Reviewed: Patient's Chart, lab work & pertinent test results, Unable to perform ROS - Chart review onlyPreop documentation limited or incomplete due to emergent nature of procedure.  Airway Mallampati: III  TM Distance: >3 FB Neck ROM: Full    Dental  (+) Partial Upper   Pulmonary Current Smoker,    breath sounds clear to auscultation       Cardiovascular hypertension, Pt. on medications  Rhythm:Regular     Neuro/Psych Last seen normal 1830, code stroke 2554 CVA, Residual Symptoms    GI/Hepatic GERD  Medicated,  Endo/Other  Morbid obesity  Renal/GU      Musculoskeletal   Abdominal   Peds  Hematology   Anesthesia Other Findings   Reproductive/Obstetrics                            Anesthesia Physical Anesthesia Plan  ASA: III and emergent  Anesthesia Plan: General   Post-op Pain Management:    Induction: Intravenous, Rapid sequence and Cricoid pressure planned  PONV Risk Score and Plan: 2 and Ondansetron and Dexamethasone  Airway Management Planned: Oral ETT  Additional Equipment: Arterial line  Intra-op Plan:   Post-operative Plan: Possible Post-op intubation/ventilation  Informed Consent:   History available from chart only  Plan Discussed with: CRNA and Surgeon  Anesthesia Plan Comments:         Anesthesia Quick Evaluation

## 2017-12-18 NOTE — Progress Notes (Signed)
PT Cancellation Note  Patient Details Name: Heather [name] MRN: [id number] DOB: 1956-02-15   Cancelled Treatment:    Reason Eval/Treat Not Completed: Other (comment)(nauseated at this time), will re-attempt next day    Fabio Asa 12/18/2017, 3:58 PM Charlotte Crumb, PT DPT  Board Certified Neurologic Specialist [phone]

## 2017-12-18 NOTE — Progress Notes (Signed)
Patient ID: Heather [name], female   DOB: [birth year], 62 y.o.   MRN: [id number] INR. 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  Neuro changes of confusion ,and lt sided weakness and lt facial droop. CT BRAIN NO ICH. CTA occluded dominant inf division Rt MCA.and ICAD stenosis of mid M1. ASPECTS 10. Option of endovascular revascularization of RT MCA inf branch discussed with patients daughter and grand daughter.Reasons,risks ,alternatives all discussed in detail.Risks of ICH of 10 to 15 %,worsening neurological deficit and vent dependency ,death and inability to revascularize were discussed in full. Questions were answered to their comprehension and satisfaction.Informed witnessed consent was obtained for endovascular treatment under [place]. S.Hiroto Saltzman MD.

## 2017-12-18 NOTE — Progress Notes (Signed)
Pt transferred to PACU at 4 am. Groin WNL, pulses R foot good. Family sent to 4N waiting area. IR checked groin with RN in PACU. IR team signing off

## 2017-12-18 NOTE — Progress Notes (Signed)
OT Cancellation Note  Patient Details Name: Heather [name] MRN: [id number] DOB: Feb 15, 1956   Cancelled Treatment:    Reason Eval/Treat Not Completed: Patient not medically ready.  Pt on bedrest.  Will reattempt.  [name], OTR/L [phone]   Jeani Hawking M 12/18/2017, 10:08 AM

## 2017-12-18 NOTE — Anesthesia Procedure Notes (Addendum)
Arterial Line Insertion Start/End3/18/2019 12:28 AM, 12/18/2017 12:33 AM Performed by: Val Eagle, MD  Patient location: OR. Preanesthetic checklist: patient identified, IV checked, site marked, risks and benefits discussed, surgical consent, monitors and equipment checked, pre-op evaluation, timeout performed and anesthesia consent Left, radial was placed Catheter size: 20 G Hand hygiene performed  and maximum sterile barriers used   Attempts: 1 Procedure performed without using ultrasound guided technique. Following insertion, dressing applied. Post procedure assessment: normal and unchanged  Patient tolerated the procedure well with no immediate complications.

## 2017-12-18 NOTE — Anesthesia Procedure Notes (Signed)
Procedure Name: Intubation Date/Time: 12/18/2017 2:45 AM Performed by: Claudina Lick, CRNA Pre-anesthesia Checklist: Patient identified, Emergency Drugs available, Suction available, Patient being monitored and Timeout performed Patient Re-evaluated:Patient Re-evaluated prior to induction Oxygen Delivery Method: Circle system utilized Preoxygenation: Pre-oxygenation with 100% oxygen Induction Type: IV induction, Rapid sequence and Cricoid Pressure applied Laryngoscope Size: McGraph and 4 Grade View: Grade I Tube type: Subglottic suction tube Tube size: 7.5 mm Number of attempts: 1 Airway Equipment and Method: Stylet and Video-laryngoscopy Placement Confirmation: ETT inserted through vocal cords under direct vision,  positive ETCO2 and breath sounds checked- equal and bilateral Secured at: 22 cm Tube secured with: Tape Dental Injury: Teeth and Oropharynx as per pre-operative assessment

## 2017-12-18 NOTE — Progress Notes (Signed)
OT Cancellation Note  Patient Details Name: Heather [name] MRN: [id number] DOB: [birth year]   Cancelled Treatment:    Reason Eval/Treat Not Completed: Other (comment) pt nauseated at this time, will re-attempt eval tomorrow.    Evette Georges [phone] 12/18/2017, 4:15 PM

## 2017-12-18 NOTE — Progress Notes (Signed)
PT Cancellation Note  Patient Details Name: Heather [name] MRN: [id number] DOB: 28-Mar-1956   Cancelled Treatment:    Reason Eval/Treat Not Completed: Patient not medically ready;Active bedrest order(bedrest until 3pm per orderset)   Fabio Asa 12/18/2017, 7:47 AM Charlotte Crumb, PT DPT  Board Certified Neurologic Specialist [phone]

## 2017-12-18 NOTE — Transfer of Care (Signed)
Immediate Anesthesia Transfer of Care Note  Patient: Heather [name]  Procedure(s) Performed: RADIOLOGY WITH ANESTHESIA (N/A Head)  Patient Location: PACU  Anesthesia Type:General  Level of Consciousness: drowsy  Airway & Oxygen Therapy: Patient Spontanous Breathing and Patient connected to face mask oxygen  Post-op Assessment: Report given to RN and Post -op Vital signs reviewed and stable  Post vital signs: Reviewed and stable  Last Vitals:  Vitals:   12/17/17 2345 12/18/17 0000  BP: (!) 133/101 (!) 147/114  Pulse: 90 88  Resp: 18 19  Temp:    SpO2: 100% 98%    Last Pain:  Vitals:   12/17/17 2311  TempSrc:   PainSc: 5          Complications: No apparent anesthesia complications

## 2017-12-18 NOTE — Progress Notes (Signed)
72: Dr. Corliss Skains paged concerning pt report of severe headache behind right eye and nausea, he will defer pain/nausea medicine decision to neurology.   0845: Deveshwar rounded, assessed pt. No new orders.

## 2017-12-18 NOTE — Procedures (Signed)
S/P rt common carotid arteriogram followed by complete revascularization of occluded the dominant inf division  Achieving a TICI 3 reperfusion , followed by recue stenting og progressive occlusion of inf division due underlying stenosis  Caused by athersclerotic plaque with a TICI 3 reperfusion. Meds. 12 mg od superselective intracranial intraarterial  Integrelin ,and loading dose of 180 mg of brilinta po and 81 mg of aspirin po.

## 2017-12-18 NOTE — Evaluation (Signed)
Clinical/Bedside Swallow Evaluation Patient Details  Name: Heather [name] MRN: [id number] Date of Birth: 08-28-1956  Today's Date: 12/18/2017 Time: SLP Start Time (ACUTE ONLY): 1630 SLP Stop Time (ACUTE ONLY): 1653 SLP Time Calculation (min) (ACUTE ONLY): 23 min  Past Medical History:  Past Medical History:  Diagnosis Date  . Hypertension    Past Surgical History:  Past Surgical History:  Procedure Laterality Date  . BREAST EXCISIONAL BIOPSY    . BREAST SURGERY     right breast  . CHOLECYSTECTOMY     HPI:  [name] with PMH of HTN,Obesity who presents [facility] emergency room as a stroke alert with left hemiparesis. Initial CT without acute infarct, tiny remote right basal ganglia infarct. s/p rt common carotid arteriogram 3/18 with revascularization/stenting of right MCA stenosis. 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    Assessment / Plan / Recommendation Clinical Impression  Patient presents with evidence of a moderate oropharyngeal dysphagia characterized by left sided facial and labial  deficits, when combined with lethargy and decreased awareness, result in delayed oral transit and anterior labial spillage of thin liquids with eventual cough response indicative of decreased airway protection. Able to consume approximately 2 ounces of pureed solids with consistent multiple swallows but no overt indication of aspiration. Recommend NPO except necessary meds crushed in puree. SLP will f/u 3/19. Prognosis for ability to resume a po diet good with improved mentation. SLP Visit Diagnosis: Dysphagia, oropharyngeal phase (R13.12)    Aspiration Risk  Moderate aspiration risk;Severe aspiration risk    Diet Recommendation NPO except meds   Medication Administration: Crushed with puree    Other  Recommendations Oral Care Recommendations: Oral care QID   Follow up Recommendations Inpatient Rehab      Frequency and Duration min 2x/week  2 weeks       Prognosis Prognosis for  Safe Diet Advancement: Good      Swallow Study   General HPI: [name] with PMH of HTN,Obesity who presents [facility] emergency room as a stroke alert with left hemiparesis. Initial CT without acute infarct, tiny remote right basal ganglia infarct. s/p rt common carotid arteriogram 3/18 with revascularization/stenting of right MCA stenosis. 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  Type of Study: Bedside Swallow Evaluation Previous Swallow Assessment: none Diet Prior to this Study: NPO Temperature Spikes Noted: No Respiratory Status: Room air History of Recent Intubation: No Behavior/Cognition: Lethargic/Drowsy;Requires cueing Oral Cavity Assessment: Within Functional Limits Oral Care Completed by SLP: Recent completion by staff Oral Cavity - Dentition: Adequate natural dentition Vision: Functional for self-feeding Self-Feeding Abilities: Able to feed self Patient Positioning: Upright in bed Baseline Vocal Quality: Normal Volitional Cough: Strong Volitional Swallow: Able to elicit    Oral/Motor/Sensory Function Overall Oral Motor/Sensory Function: Moderate impairment Facial ROM: Reduced left;Suspected CN VII (facial) dysfunction Facial Symmetry: Abnormal symmetry left;Suspected CN VII (facial) dysfunction Facial Strength: Reduced left;Suspected CN VII (facial) dysfunction Lingual ROM: Within Functional Limits Lingual Symmetry: Within Functional Limits Lingual Strength: Reduced Velum: Within Functional Limits Mandible: Within Functional Limits   Ice Chips Ice chips: Impaired Presentation: Spoon Oral Phase Impairments: Reduced labial seal;Reduced lingual movement/coordination;Impaired mastication;Poor awareness of bolus Oral Phase Functional Implications: Left anterior spillage;Right anterior spillage;Prolonged oral transit Pharyngeal Phase Impairments: Multiple swallows;Cough - Immediate;Throat Clearing - Immediate   Thin Liquid Thin Liquid: Impaired Presentation: Cup;Self  Fed Oral Phase Impairments: Reduced labial seal Pharyngeal  Phase Impairments: Multiple swallows    Nectar Thick Nectar Thick Liquid: Not tested   Honey Thick  Honey Thick Liquid: Not tested   Puree Puree: Within functional limits Presentation: Spoon   Solid   Ferdinand Lango MA, CCC-SLP [phone]    Solid: Not tested        Heather [name] 12/18/2017,4:57 PM

## 2017-12-18 NOTE — Progress Notes (Signed)
Inpatient Diabetes Program Recommendations  AACE/ADA: New Consensus Statement on Inpatient Glycemic Control (2015)  Target Ranges:  Prepandial:   less than 140 mg/dL      Peak postprandial:   less than 180 mg/dL (1-2 hours)      Critically ill patients:  140 - 180 mg/dL  Results for Heather [name], Heather [name] (MRN [id number]) as of 12/18/2017 09:41  Ref. Range 12/18/2017 04:05  Hemoglobin A1C Latest Ref Range: 4.8 - 5.6 % 6.7 (H)  Results for [name], [name] (MRN [id number]) as of 12/18/2017 09:41  Ref. Range 12/17/2017 22:56  Glucose-Capillary Latest Ref Range: 65 - 99 mg/dL 184 (H)   Results for [name], [name] (MRN [id number]) as of 12/18/2017 09:41  Ref. Range 09/05/2016 10:17 10/18/2017 16:14 12/17/2017 22:55 12/17/2017 23:00 12/18/2017 04:05  Glucose Latest Ref Range: 65 - 99 mg/dL 174 (H) 141 (H) 204 (H) 202 (H) 214 (H)   Review of Glycemic Control  Diabetes history: No Outpatient Diabetes medications: NA Current orders for Inpatient glycemic control: None  Inpatient Diabetes Program Recommendations: Correction (SSI): Please consider ordering CBGs with Novolog 0-9 units Q4H. HgbA1C: A1C 6.7% on 12/18/17 indicating an average glucose of 146 mg/dl over the past 2-3 months. Per ADA, if A1C 6.5% or greater then criteria met to dx with DM. MD, please indicate in note if patient will be newly dx with DM. If so, please inform patient and nursing staff so patient can be educated.   Thanks, Barnie Alderman, RN, MSN, CDE Diabetes Coordinator Inpatient Diabetes Program [phone] (Team Pager from 8am to 5pm)

## 2017-12-18 NOTE — Progress Notes (Signed)
Referring Physician(s): Aroor, Georgiana Spinner R  Supervising Physician: Julieanne Cotton  Patient Status:  [facility] - In-pt  Chief Complaint:   CVA RMCA stenosis and revascularization/stenting  Subjective:  Post-op day 0 s/p RMCA stenosis and revascularization/stenting 12/18/2017. Alert and aware. Right gaze deviation but crosses midline. Left sided weakness improving with UE>LE, no facial weakness/drooping. Distal pulses intact. Right groin incision clean dry and intact.  Allergies: Patient has no known allergies.  Medications: Prior to Admission medications   Medication Sig Start Date End Date Taking? Authorizing Provider  albuterol (PROVENTIL HFA;VENTOLIN HFA) 108 (90 Base) MCG/ACT inhaler Inhale 2 puffs into the lungs every 6 (six) hours as needed for wheezing or shortness of breath. 09/12/16   Cherlynn Perches, MD  aspirin EC 81 MG tablet Take 81 mg by mouth as needed.    [provider]  ibuprofen (ADVIL,MOTRIN) 200 MG tablet Take 200 mg by mouth every 6 (six) hours as needed.    [provider]  meclizine (ANTIVERT) 32 MG tablet Take 1 tablet (32 mg total) by mouth 3 (three) times daily as needed. 09/05/16   Lizbeth Bark, FNP  naproxen (NAPROSYN) 250 MG tablet Take 1 tablet (250 mg total) by mouth 2 (two) times daily with a meal. 10/19/17   Little, Ambrose Finland, MD  promethazine (PHENERGAN) 25 MG tablet Take 1 tablet (25 mg total) by mouth every 6 (six) hours as needed for nausea or vomiting. 10/19/17   Little, Ambrose Finland, MD  ranitidine (ZANTAC) 150 MG tablet Take 1 tablet (150 mg total) by mouth 2 (two) times daily. 09/05/16   Lizbeth Bark, FNP     Vital Signs: BP 106/61   Pulse 83   Temp 98 F (36.7 C) (Oral)   Resp 16   Wt 265 lb 3.4 oz (120.3 kg)   SpO2 100%   BMI 40.33 kg/m   Physical Exam  Constitutional: She is oriented to person, place, and time. She appears well-developed. No distress.  Eyes:  Right gaze deviation but does  cross midline.  Cardiovascular: Normal rate, regular rhythm, normal heart sounds and intact distal pulses.  No murmur heard. Pulmonary/Chest: Effort normal and breath sounds normal. She has no wheezes.  Neurological: She is alert and oriented to person, place, and time.  LUE motor 2/5. LLE motor 3/5. No facial weakness/drooping.  Skin: Skin is warm and dry.  Right groin incision c/d/i without hemorrhage or active bleeding.  Psychiatric: She has a normal mood and affect. Her behavior is normal. Judgment and thought content normal.  Nursing note and vitals reviewed.   Imaging: Ct Angio Head W Or Wo Contrast  Result Date: 12/18/2017 CLINICAL DATA:  Initial evaluation for acute left-sided deficits. EXAM: CT ANGIOGRAPHY HEAD AND NECK TECHNIQUE: Multidetector CT imaging of the head and neck was performed using the standard protocol during bolus administration of intravenous contrast. Multiplanar CT image reconstructions and MIPs were obtained to evaluate the vascular anatomy. Carotid stenosis measurements (when applicable) are obtained utilizing NASCET criteria, using the distal internal carotid diameter as the denominator. CONTRAST:  50 cc of Isovue 370. COMPARISON:  Prior CT from earlier the same day. FINDINGS: CTA NECK FINDINGS Aortic arch: Visualized aortic arch of normal caliber with normal branch pattern. No high-grade stenosis about the origin of the great vessels. Visualized subclavian arteries widely patent. Right carotid system: Right common carotid artery widely patent to the bifurcation without stenosis. Right ICA widely patent from the bifurcation to the skull base without stenosis, dissection,  or occlusion. Medial station of the right carotid artery system into the retropharyngeal space noted. No significant atheromatous narrowing about the right carotid bifurcation. Left carotid system: Left common and internal carotid arteries are widely patent without stenosis, dissection, or occlusion.  No significant atheromatous narrowing about the left carotid bifurcation. Vertebral arteries: Both of the vertebral arteries arise from the subclavian arteries. Left vertebral artery dominant with a diffusely hypoplastic right vertebral artery. Vertebral arteries widely patent within the neck without stenosis, dissection, or occlusion. Skeleton: No acute osseus abnormality. No worrisome lytic or blastic osseous lesions. Other neck: No acute soft tissue abnormality within the neck. Salivary glands normal. Thyroid within normal limits. No adenopathy. Upper chest: Visualized upper chest within normal limits. Visualized upper lungs are grossly clear. Few tiny tree-in-bud nodular densities noted at the medial left upper lobe, favored to reflect mucoid impaction, of doubtful significance. Review of the MIP images confirms the above findings CTA HEAD FINDINGS Anterior circulation: Petrous, cavernous, and supraclinoid segments of the internal carotid arteries are widely patent without flow-limiting stenosis. ICA termini widely patent. Dominant right A1 segment widely patent. Left A1 segment diffusely hypoplastic, accounting for the slightly diminutive left ICA is compared to the right. Widely patent and normal anterior communicating artery. Anterior cerebral arteries well perfused to their distal aspects without stenosis. Focal irregularity with mild to moderate stenosis noted at the mid right M1 segment (series 8, image 101). Right M1 patent distally. There is abrupt near occlusion of a proximal right M2 branch, middle division (series 11, image 20). Right M2 immediately branches just distally, with markedly thready and irregular flow within the M3 and M4 segments distally (series 8, image 90). Finding suspicious for possible subocclusive thrombus. A superimposed high-grade stenosis may be contributory as well. Overall, there is decreased perfusion with poor collateralization within the distal right MCA territory as  compared to the left (series 11, image 14). Left M1 segment widely patent without stenosis or occlusion. Normal left MCA bifurcation. No proximal left M2 occlusion. Distal left MCA branches well perfused. Mild distal small vessel atheromatous irregularity. Posterior circulation: Vertebral arteries patent to the vertebrobasilar junction without stenosis. Left vertebral artery dominant. Patent left PICA. Right PICA not well visualized. Basilar artery widely patent to its distal aspect. Superior cerebral arteries patent bilaterally. Both of the posterior cerebral arteries primarily supplied via the basilar. Short-segment mild to moderate proximal left P1 stenosis. Mild atheromatous irregularity within the bilateral P2 segments and distal PCAs without high-grade stenosis. Bilateral posterior communicating arteries noted, right larger than left. Venous sinuses: Patent. Anatomic variants: Hypoplastic left A1 segment. Dominant left vertebral artery. No intracranial aneurysm. Delayed phase: Not performed. Review of the MIP images confirms the above findings IMPRESSION: 1. 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 Finding suspicious for near occlusive/subocclusive thrombus. Underlying stenosis may be contributory as well. 2. Irregular approximate 50% moderate mid right M1 stenosis. 3. Mild distal small vessel atheromatous irregularity. Critical Value/emergent results were called by telephone at the time of interpretation on 12/17/2017 at 11:35 pm to Dr. Arther Dames , who verbally acknowledged these results. Electronically Signed   By: Rise Mu M.D.   On: 12/18/2017 00:07   Ct Angio Neck W Or Wo Contrast  Result Date: 12/18/2017 CLINICAL DATA:  Initial evaluation for acute left-sided deficits. EXAM: CT ANGIOGRAPHY HEAD AND NECK TECHNIQUE: Multidetector CT imaging of the head and neck was performed using the standard protocol during bolus  administration of intravenous contrast. Multiplanar CT  image reconstructions and MIPs were obtained to evaluate the vascular anatomy. Carotid stenosis measurements (when applicable) are obtained utilizing NASCET criteria, using the distal internal carotid diameter as the denominator. CONTRAST:  50 cc of Isovue 370. COMPARISON:  Prior CT from earlier the same day. FINDINGS: CTA NECK FINDINGS Aortic arch: Visualized aortic arch of normal caliber with normal branch pattern. No high-grade stenosis about the origin of the great vessels. Visualized subclavian arteries widely patent. Right carotid system: Right common carotid artery widely patent to the bifurcation without stenosis. Right ICA widely patent from the bifurcation to the skull base without stenosis, dissection, or occlusion. Medial station of the right carotid artery system into the retropharyngeal space noted. No significant atheromatous narrowing about the right carotid bifurcation. Left carotid system: Left common and internal carotid arteries are widely patent without stenosis, dissection, or occlusion. No significant atheromatous narrowing about the left carotid bifurcation. Vertebral arteries: Both of the vertebral arteries arise from the subclavian arteries. Left vertebral artery dominant with a diffusely hypoplastic right vertebral artery. Vertebral arteries widely patent within the neck without stenosis, dissection, or occlusion. Skeleton: No acute osseus abnormality. No worrisome lytic or blastic osseous lesions. Other neck: No acute soft tissue abnormality within the neck. Salivary glands normal. Thyroid within normal limits. No adenopathy. Upper chest: Visualized upper chest within normal limits. Visualized upper lungs are grossly clear. Few tiny tree-in-bud nodular densities noted at the medial left upper lobe, favored to reflect mucoid impaction, of doubtful significance. Review of the MIP images confirms the above findings CTA HEAD FINDINGS  Anterior circulation: Petrous, cavernous, and supraclinoid segments of the internal carotid arteries are widely patent without flow-limiting stenosis. ICA termini widely patent. Dominant right A1 segment widely patent. Left A1 segment diffusely hypoplastic, accounting for the slightly diminutive left ICA is compared to the right. Widely patent and normal anterior communicating artery. Anterior cerebral arteries well perfused to their distal aspects without stenosis. Focal irregularity with mild to moderate stenosis noted at the mid right M1 segment (series 8, image 101). Right M1 patent distally. There is abrupt near occlusion of a proximal right M2 branch, middle division (series 11, image 20). Right M2 immediately branches just distally, with markedly thready and irregular flow within the M3 and M4 segments distally (series 8, image 90). Finding suspicious for possible subocclusive thrombus. A superimposed high-grade stenosis may be contributory as well. Overall, there is decreased perfusion with poor collateralization within the distal right MCA territory as compared to the left (series 11, image 14). Left M1 segment widely patent without stenosis or occlusion. Normal left MCA bifurcation. No proximal left M2 occlusion. Distal left MCA branches well perfused. Mild distal small vessel atheromatous irregularity. Posterior circulation: Vertebral arteries patent to the vertebrobasilar junction without stenosis. Left vertebral artery dominant. Patent left PICA. Right PICA not well visualized. Basilar artery widely patent to its distal aspect. Superior cerebral arteries patent bilaterally. Both of the posterior cerebral arteries primarily supplied via the basilar. Short-segment mild to moderate proximal left P1 stenosis. Mild atheromatous irregularity within the bilateral P2 segments and distal PCAs without high-grade stenosis. Bilateral posterior communicating arteries noted, right larger than left. Venous sinuses:  Patent. Anatomic variants: Hypoplastic left A1 segment. Dominant left vertebral artery. No intracranial aneurysm. Delayed phase: Not performed. Review of the MIP images confirms the above findings IMPRESSION: 1. 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 Finding suspicious for near occlusive/subocclusive thrombus. Underlying stenosis may be contributory as well. 2. Irregular approximate 50%  moderate mid right M1 stenosis. 3. Mild distal small vessel atheromatous irregularity. Critical Value/emergent results were called by telephone at the time of interpretation on 12/17/2017 at 11:35 pm to Dr. Arther Dames , who verbally acknowledged these results. Electronically Signed   By: Rise Mu M.D.   On: 12/18/2017 00:07   Dg Chest Port 1 View  Result Date: 12/18/2017 CLINICAL DATA:  Status post acute CVA, current smoker. EXAM: PORTABLE CHEST 1 VIEW COMPARISON:  Chest x-ray of September 12, 2016 FINDINGS: The lungs are well-expanded and clear. The interstitial markings are mildly prominent though stable. The heart is top-normal in size. The pulmonary vascularity is normal. The mediastinum is normal in width. IMPRESSION: There is no acute cardiopulmonary abnormality. No evidence of aspiration pneumonia. Electronically Signed   By: David  [place] M.D.   On: 12/18/2017 08:36   Ct Head Code Stroke Wo Contrast  Result Date: 12/17/2017 CLINICAL DATA:  Code stroke. Initial evaluation for acute left-sided deficits. EXAM: CT HEAD WITHOUT CONTRAST TECHNIQUE: Contiguous axial images were obtained from the base of the skull through the vertex without intravenous contrast. COMPARISON:  Prior CT from 09/01/2013. FINDINGS: Brain: Cerebral volume within normal limits for age. No acute intracranial hemorrhage. No acute large vessel territory infarct. Tiny remote right basal ganglia lacunar infarct noted. No mass lesion, midline shift or mass effect. No hydrocephalus.  No extra-axial fluid collection. Vascular: No hyperdense vessel. Scattered vascular calcifications noted within the carotid siphons. Skull: Scalp soft tissues and calvarium demonstrate no acute abnormality. Sinuses/Orbits: Globes and orbital soft tissues within normal limits. Partial gas fixation of the lateral recess of the right sphenoid sinus noted. Paranasal sinuses are otherwise clear. No mastoid effusion. Other: None. ASPECTS [facility] Stroke Program Early CT Score) - Ganglionic level infarction (caudate, lentiform nuclei, internal capsule, insula, M1-M3 cortex): 7 - Supraganglionic infarction (M4-M6 cortex): 3 Total score (0-10 with 10 being normal): 10 IMPRESSION: 1. No acute intracranial infarct or other process identified. 2. ASPECTS is 10. 3. Tiny remote right basal ganglia lacunar infarct. These results were communicated to Aroor at 11:11 pmon 3/17/2019by text page via the [facility] messaging system. Electronically Signed   By: Rise Mu M.D.   On: 12/17/2017 23:14    Labs:  CBC: Recent Labs    07/06/17 1550 10/18/17 1614 12/17/17 2255 12/17/17 2300 12/18/17 0405  WBC 10.5 14.4* 8.0  --  9.8  HGB 14.9 15.6* 13.1 14.3 12.3  HCT 43.7 47.1* 40.9 42.0 37.6  PLT 295 311 247  --  226    COAGS: Recent Labs    12/17/17 2255  INR 0.89  APTT 28    BMP: Recent Labs    10/18/17 1614 12/17/17 2255 12/17/17 2300 12/18/17 0405  NA 134* 138 142 139  K 4.5 4.1 4.1 4.4  CL 100* 105 104 110  CO2 26 25  --  21*  GLUCOSE 141* 204* 202* 214*  BUN 15 13 15 10   CALCIUM 9.1 9.0  --  7.7*  CREATININE 0.48 0.57 0.50 0.54  GFRNONAA >60 >60  --  >60  GFRAA >60 >60  --  >60    LIVER FUNCTION TESTS: Recent Labs    10/18/17 1614 12/17/17 2255  BILITOT 0.5 0.3  AST 26 21  ALT 26 23  ALKPHOS 97 76  PROT 7.6 6.5  ALBUMIN 4.2 3.5    Assessment and Plan:  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 Left sided weakness improving. Groin intact. Appreciate  Neurology consult. Continue Brilinta and  ASA as directed. IR will continue to monitor.  Electronically Signed: Elwin Mocha, PA-C 12/18/2017, 9:08 AM   I spent a total of 15 Minutes at the the patient's bedside AND on the patient's hospital floor or unit, greater than 50% of which was counseling/coordinating care for CVA/RMCA stenosis.

## 2017-12-18 NOTE — ED Notes (Signed)
Spoke to Dr.Aroor, pt to go to IR; all clothing removed and given to grand daughter, bilateral pedal pulses marked

## 2017-12-18 NOTE — Progress Notes (Signed)
SLP Cancellation Note  Patient Details Name: Heather [name] MRN: [id number] DOB: 07/29/1956   Cancelled treatment:       Reason Eval/Treat Not Completed: Patient not medically ready. Pt was able to sit up, but refused assessment due to nausea. Understood that she will have swallow eval in am.   Harlon Ditty, MA CCC-SLP [phone]    Claudine Mouton 12/18/2017, 2:44 PM

## 2017-12-18 NOTE — Plan of Care (Signed)
  Progressing Ischemic Stroke/TIA Tissue Perfusion: Complications of ischemic stroke/TIA will be minimized 12/18/2017 1815 - Progressing by Yates Decamp, RN BP being monitored via left radial arterial line, with SBP parameters <160, per Dr. Corliss Skains. Pressure above parameters running max rate of cardene, Dr. Pearlean Brownie notified, cardene d'c'd and cleviprex initiated. Pressures are < 160 running at the max rate of cleviprex. Pt is able to move all 4 extremities to command, with weakness greater on the left side. Left facial droop.  Elimination: Will not experience complications related to urinary retention 12/18/2017 1815 - Progressing by Yates Decamp, RN Foley catheter in place. Skin Integrity: Risk for impaired skin integrity will decrease 12/18/2017 1815 - Progressing by Yates Decamp, RN Pt turned q2 hours, keeping pressure off bony prominences Not Progressing Nutrition: Risk of aspiration will decrease 12/18/2017 1815 - Not Progressing by Yates Decamp, RN Pt with complaints of nausea throughout shift, no emesis reported. IV Reglan and Zofran administered with minimal relief. Pt was assessed by speech therapy, who recommended pt stay NPO but allowing necessary medications to be crushed and taken with applesauce

## 2017-12-19 ENCOUNTER — Inpatient Hospital Stay (HOSPITAL_COMMUNITY): Payer: Medicaid Other

## 2017-12-19 ENCOUNTER — Inpatient Hospital Stay (HOSPITAL_COMMUNITY)
Admission: RE | Admit: 2017-12-19 | Discharge: 2017-12-28 | DRG: 057 | Disposition: A | Discharge status: Home Health | Payer: Self-pay | Source: Intra-hospital | Attending: Physical Medicine & Rehabilitation | Admitting: Physical Medicine & Rehabilitation

## 2017-12-19 ENCOUNTER — Encounter (HOSPITAL_COMMUNITY): Payer: Self-pay | Admitting: *Deleted

## 2017-12-19 ENCOUNTER — Other Ambulatory Visit: Payer: Self-pay

## 2017-12-19 DIAGNOSIS — E1169 Type 2 diabetes mellitus with other specified complication: Secondary | ICD-10-CM

## 2017-12-19 DIAGNOSIS — G8194 Hemiplegia, unspecified affecting left nondominant side: Secondary | ICD-10-CM

## 2017-12-19 DIAGNOSIS — I10 Essential (primary) hypertension: Secondary | ICD-10-CM | POA: Diagnosis present

## 2017-12-19 DIAGNOSIS — I63511 Cerebral infarction due to unspecified occlusion or stenosis of right middle cerebral artery: Principal | ICD-10-CM

## 2017-12-19 DIAGNOSIS — I69312 Visuospatial deficit and spatial neglect following cerebral infarction: Secondary | ICD-10-CM

## 2017-12-19 DIAGNOSIS — E119 Type 2 diabetes mellitus without complications: Secondary | ICD-10-CM | POA: Diagnosis present

## 2017-12-19 DIAGNOSIS — R1310 Dysphagia, unspecified: Secondary | ICD-10-CM | POA: Diagnosis present

## 2017-12-19 DIAGNOSIS — E669 Obesity, unspecified: Secondary | ICD-10-CM

## 2017-12-19 DIAGNOSIS — I69354 Hemiplegia and hemiparesis following cerebral infarction affecting left non-dominant side: Principal | ICD-10-CM

## 2017-12-19 DIAGNOSIS — R51 Headache: Secondary | ICD-10-CM | POA: Diagnosis present

## 2017-12-19 DIAGNOSIS — R1312 Dysphagia, oropharyngeal phase: Secondary | ICD-10-CM

## 2017-12-19 DIAGNOSIS — I69391 Dysphagia following cerebral infarction: Secondary | ICD-10-CM

## 2017-12-19 LAB — BASIC METABOLIC PANEL
ANION GAP: 9 (ref 5–15)
BUN: <5 mg/dL — ABNORMAL LOW (ref 6–20)
CALCIUM: 8.6 mg/dL — AB (ref 8.9–10.3)
CO2: 25 mmol/L (ref 22–32)
Chloride: 104 mmol/L (ref 101–111)
Creatinine, Ser: 0.39 mg/dL — ABNORMAL LOW (ref 0.44–1.00)
Glucose, Bld: 184 mg/dL — ABNORMAL HIGH (ref 65–99)
Potassium: 3.5 mmol/L (ref 3.5–5.1)
Sodium: 138 mmol/L (ref 135–145)

## 2017-12-19 LAB — CBC
HCT: 37.8 % (ref 36.0–46.0)
HEMOGLOBIN: 12.3 g/dL (ref 12.0–15.0)
MCH: 30.7 pg (ref 26.0–34.0)
MCHC: 32.5 g/dL (ref 30.0–36.0)
MCV: 94.3 fL (ref 78.0–100.0)
Platelets: 237 10*3/uL (ref 150–400)
RBC: 4.01 MIL/uL (ref 3.87–5.11)
RDW: 13.2 % (ref 11.5–15.5)
WBC: 10.9 10*3/uL — ABNORMAL HIGH (ref 4.0–10.5)

## 2017-12-19 LAB — ECHOCARDIOGRAM COMPLETE: WEIGHTICAEL: 4243.41 [oz_av]

## 2017-12-19 LAB — HIV ANTIBODY (ROUTINE TESTING W REFLEX): HIV Screen 4th Generation wRfx: NONREACTIVE

## 2017-12-19 MED ORDER — ASPIRIN 81 MG PO CHEW
81.0000 mg | CHEWABLE_TABLET | Freq: Every day | ORAL | Status: DC
  Filled 2017-12-19 (×3): qty 1

## 2017-12-19 MED ORDER — INFLUENZA VAC SPLIT QUAD 0.5 ML IM SUSY: 0.5000 mL | PREFILLED_SYRINGE | INTRAMUSCULAR | Status: DC

## 2017-12-19 MED ORDER — ONDANSETRON HCL 4 MG/2ML IJ SOLN
4.0000 mg | Freq: Three times a day (TID) | INTRAMUSCULAR | Status: DC | PRN
  Administered 2017-12-19: 4 mg via INTRAVENOUS
  Filled 2017-12-19: qty 2

## 2017-12-19 MED ORDER — ONDANSETRON HCL 4 MG/2ML IJ SOLN
4.0000 mg | Freq: Four times a day (QID) | INTRAMUSCULAR | Status: DC | PRN
  Administered 2017-12-21: 4 mg via INTRAVENOUS
  Filled 2017-12-19: qty 2

## 2017-12-19 MED ORDER — SORBITOL 70 % SOLN
30.0000 mL | Freq: Every day | Status: DC | PRN
  Administered 2017-12-23: 30 mL via ORAL
  Filled 2017-12-19 (×2): qty 30

## 2017-12-19 MED ORDER — ACETAMINOPHEN 160 MG/5ML PO SOLN: 650.0000 mg | ORAL | Status: DC | PRN

## 2017-12-19 MED ORDER — ONDANSETRON HCL 4 MG/2ML IJ SOLN
INTRAMUSCULAR | Status: AC
  Administered 2017-12-19: 4 mg
  Filled 2017-12-19: qty 2

## 2017-12-19 MED ORDER — ACETAMINOPHEN 325 MG PO TABS
650.0000 mg | ORAL_TABLET | ORAL | Status: DC | PRN
  Administered 2017-12-19 – 2017-12-27 (×8): 650 mg via ORAL
  Filled 2017-12-19 (×8): qty 2

## 2017-12-19 MED ORDER — TICAGRELOR 90 MG PO TABS
90.0000 mg | ORAL_TABLET | Freq: Two times a day (BID) | ORAL | Status: DC
  Administered 2017-12-19 – 2017-12-28 (×18): 90 mg via ORAL
  Filled 2017-12-19 (×18): qty 1

## 2017-12-19 MED ORDER — RESOURCE THICKENUP CLEAR PO POWD
ORAL | Status: DC | PRN
  Filled 2017-12-19: qty 125

## 2017-12-19 MED ORDER — ACETAMINOPHEN-CODEINE #3 300-30 MG PO TABS
1.0000 | ORAL_TABLET | Freq: Four times a day (QID) | ORAL | Status: DC | PRN
Start: 2017-12-19 — End: 2017-12-28
  Administered 2017-12-20 – 2017-12-22 (×9): 2 via ORAL
  Administered 2017-12-23 – 2017-12-24 (×2): 1 via ORAL
  Administered 2017-12-27 – 2017-12-28 (×2): 2 via ORAL
  Filled 2017-12-19: qty 1
  Filled 2017-12-19 (×8): qty 2
  Filled 2017-12-19: qty 1
  Filled 2017-12-19 (×4): qty 2

## 2017-12-19 MED ORDER — ACETAMINOPHEN 650 MG RE SUPP: 650.0000 mg | RECTAL | Status: DC | PRN

## 2017-12-19 MED ORDER — ASPIRIN 81 MG PO CHEW
81.0000 mg | CHEWABLE_TABLET | Freq: Every day | ORAL | Status: DC
  Administered 2017-12-20 – 2017-12-28 (×9): 81 mg via ORAL
  Filled 2017-12-19 (×9): qty 1

## 2017-12-19 MED ORDER — SENNOSIDES-DOCUSATE SODIUM 8.6-50 MG PO TABS
1.0000 | ORAL_TABLET | Freq: Every evening | ORAL | Status: DC | PRN
  Administered 2017-12-22: 1 via ORAL
  Filled 2017-12-19: qty 1

## 2017-12-19 MED ORDER — ALUM & MAG HYDROXIDE-SIMETH 200-200-20 MG/5ML PO SUSP
30.0000 mL | Freq: Four times a day (QID) | ORAL | Status: DC | PRN
  Administered 2017-12-19 – 2017-12-21 (×2): 30 mL via ORAL
  Filled 2017-12-19 (×2): qty 30

## 2017-12-19 MED ORDER — ORAL CARE MOUTH RINSE
15.0000 mL | Freq: Two times a day (BID) | OROMUCOSAL | Status: DC
  Administered 2017-12-19 – 2017-12-23 (×9): 15 mL via OROMUCOSAL

## 2017-12-19 MED ORDER — ACETAMINOPHEN-CODEINE #3 300-30 MG PO TABS
1.0000 | ORAL_TABLET | Freq: Four times a day (QID) | ORAL | Status: DC | PRN
  Administered 2017-12-19: 1 via ORAL
  Filled 2017-12-19: qty 1

## 2017-12-19 MED ORDER — ONDANSETRON HCL 4 MG PO TABS
4.0000 mg | ORAL_TABLET | Freq: Four times a day (QID) | ORAL | Status: DC | PRN
  Administered 2017-12-20 – 2017-12-23 (×7): 4 mg via ORAL
  Filled 2017-12-19 (×7): qty 1

## 2017-12-19 MED ORDER — ACETAMINOPHEN-CODEINE #3 300-30 MG PO TABS: 1.0000 | ORAL_TABLET | Freq: Four times a day (QID) | ORAL | Status: DC | PRN

## 2017-12-19 MED ORDER — TICAGRELOR 90 MG PO TABS: 90.0000 mg | ORAL_TABLET | Freq: Two times a day (BID) | ORAL | Status: DC

## 2017-12-19 NOTE — PMR Pre-admission (Signed)
PMR Admission Coordinator Pre-Admission Assessment  Patient: Heather [name] is an 62 y.o., female MRN: [id number] DOB: 09-11-1956 Height: 5\' 8"  (172.7 cm) Weight: 120.3 kg (265 lb 3.4 oz)             Insurance Information Self pay - no insurance  Medicaid Application Date:        Case Manager:   Disability Application Date:        Case Worker:    Emergency Information    Name Relation Home Work Mobile   Polanco,Elaine Niece [phone]       Current Medical History  Patient Admitting Diagnosis:  R MCA infarct  History of Present Illness: A 62 y.o.right handed Spanish-speaking female with history of hypertension and  tobacco abuse. On no scheduled medications. Patient lives with grandaughter.  Independent prior to admission. Grandaughter works during the day. 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 No associated hemorrhage. Mild regional mass effect with no midline shift.Patient did not receive TPA. CT angiogram head and neck showed abrupt proximal right M2 occlusion/ near occlusion.Underwent mechanical thrombectomy per interventional radiology.Echocardiogram is pending. Currently maintained on aspirin as well as Brilinta for CVA prophylaxis. Close monitoring of blood pressure. Physical and occupational therapy evaluations are pending. M.D. Has requested physical medicine rehabilitation consult.  Total: 9=NIH  Past Medical History  Past Medical History:  Diagnosis Date  . Hypertension     Family History  family history includes Diabetes in her mother; Heart disease in her father; Pulmonary disease in her mother.  Prior Rehab/Hospitalizations: No previous rehab  Has the patient had major surgery during 100 days prior to admission? No  Current Medications   Current Facility-Administered Medications:  .  0.9 %  sodium chloride infusion, ,  Intravenous, Continuous, Deveshwar, Sanjeev, MD, Last Rate: 75 mL/hr at 12/19/17 1011 .  acetaminophen (TYLENOL) tablet 650 mg, 650 mg, Oral, Q4H PRN, 650 mg at 12/18/17 1945 **OR** acetaminophen (TYLENOL) solution 650 mg, 650 mg, Per Tube, Q4H PRN **OR** acetaminophen (TYLENOL) suppository 650 mg, 650 mg, Rectal, Q4H PRN, Deveshwar, Sanjeev, MD .  acetaminophen-codeine (TYLENOL #3) 300-30 MG per tablet 1-2 tablet, 1-2 tablet, Oral, Q6H PRN, 12/20/17, MD, 1 tablet at 12/19/17 1139 .  aspirin chewable tablet 81 mg, 81 mg, Oral, Daily, 81 mg at 12/19/17 1029 **OR** aspirin chewable tablet 81 mg, 81 mg, Per Tube, Daily, Deveshwar, Sanjeev, MD .  12/21/17 ON 12/20/2017] Influenza vac split quadrivalent PF (FLUARIX) injection 0.5 mL, 0.5 mL, Intramuscular, Tomorrow-1000, Sethi, Pramod S, MD .  ondansetron (ZOFRAN) injection 4 mg, 4 mg, Intravenous, Q8H PRN, Rinehuls, David L, PA-C .  RESOURCE THICKENUP CLEAR, , Oral, PRN, 07-25-1991, MD .  senna-docusate (Senokot-S) tablet 1 tablet, 1 tablet, Oral, QHS PRN, Aroor, Micki Riley, MD .  ticagrelor (BRILINTA) tablet 90 mg, 90 mg, Oral, BID, 90 mg at 12/19/17 1029 **OR** ticagrelor (BRILINTA) tablet 90 mg, 90 mg, Per Tube, BID, 12/21/17, MD  Patients Current Diet: Fall precautions DIET - DYS 1 Room service appropriate? Yes; Fluid consistency: Nectar Thick  Precautions / Restrictions Precautions Precautions: Fall Restrictions Weight Bearing Restrictions: No   Has the patient had 2 or more falls or a fall with injury in the past year?Yes.  Daughter reports at least 6 falls this past year.  Patient drinks heavily on the weekends.  Prior Activity Level Limited Community (1-2x/wk): Went out 2-3 X a week with  grand children.  Did not drive.  Home Assistive Devices / Equipment Home Assistive Devices/Equipment: None Home Equipment: None  Prior Device Use: Indicate devices/aids used by the patient prior to current illness, exacerbation or  injury? None  Prior Functional Level Prior Function Level of Independence: Independent Comments: ADLs and IADLs. Does not drive. 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  Independent  Indoor Mobility: Did the patient need assistance with walking from room to room (with or without device)? Independent  Stairs: Did the patient need assistance with internal or external stairs (with or without device)? Independent  Functional Cognition: Did the patient need help planning regular tasks such as shopping or remembering to take medications? Independent  Current Functional Level Cognition  Overall Cognitive Status: Impaired/Different from baseline Current Attention Level: Sustained Orientation Level: Oriented X4 Following Commands: Follows one step commands with increased time, Follows one step commands inconsistently Safety/Judgement: Decreased awareness of deficits General Comments: Pt stating it is "quarter to one" and the time was 1038. With min VCs she states "twenty till 44," which is the correct time. Pt requiring increased cues to follow simple commands and requiring increased cues for visual testing and pt fatigues quickly.     Extremity Assessment (includes Sensation/Coordination)  Upper Extremity Assessment: LUE deficits/detail LUE Deficits / Details: Pt with decreased strength (distally<proximally). Very poor grasp strength and no finger opposition. Pt able to perform shoulder and elbow flexion against gravity. Pt able to bring LUE to nose and top of head LUE Coordination: decreased fine motor, decreased gross motor  Lower Extremity Assessment: Defer to PT evaluation LLE Deficits / Details: 3/5 hip flexion, knee flexion and extension. pt reports equal sensation    ADLs  Overall ADL's : Needs assistance/impaired Eating/Feeding: Minimal assistance, Sitting Grooming: Minimal assistance, Sitting,  Brushing hair Upper Body Bathing: Minimal assistance, Sitting Lower Body Bathing: Moderate assistance, Sit to/from stand Upper Body Dressing : Minimal assistance, Sitting Lower Body Dressing: Moderate assistance, Sit to/from stand Toilet Transfer: Minimal assistance, Ambulation(Simulated to recliner) Functional mobility during ADLs: Minimal assistance General ADL Comments: Pt with decreased functional performance and is impacted by poor functional use of LUE, poor balance, vision deficits, and decreased cognition    Mobility  Overal bed mobility: Needs Assistance Bed Mobility: Supine to Sit Supine to sit: Min assist General bed mobility comments: assist for lines with pt using momentum to elevate trunk with RUE assist to fully raise trunk from surface and increased time, cues to scoot to EOB    Transfers  Overall transfer level: Needs assistance Transfers: Sit to/from Stand Sit to Stand: Min guard General transfer comment: guarding for balance to stand, decreased contol of descent with close guarding to sit to prevent fall    Ambulation / Gait / Stairs / Wheelchair Mobility  Ambulation/Gait Ambulation/Gait assistance: Hydrographic surveyor (Feet): 100 Feet Assistive device: None Gait Pattern/deviations: Step-through pattern, Decreased stride length General Gait Details: pt with decreased speed and stability with gait with ability to walk short distance slowly and cautiously without UE assist, cues for direction and room Gait velocity interpretation: Below normal speed for age/gender    Posture / Balance Balance Overall balance assessment: Needs assistance Sitting-balance support: No upper extremity supported, Feet supported Sitting balance-Leahy Scale: Fair Standing balance-Leahy Scale: Good    Special needs/care consideration BiPAP/CPAP No CPM No Continuous Drip IV 0.9% NS 75 mL/hr Dialysis No  Life Vest No Oxygen No Special Bed No Trach Size No Wound Vac  (area) No      Skin No                           Bowel mgmt: No documented BM since admission 12/17/17 Bladder mgmt: Voiding up with assistance Diabetic mgmt No    Previous Home Environment Living Arrangements: Other relatives(granddaughter) Available Help at Discharge: Family, Available 24 hours/day Type of Home: Apartment Home Layout: One level Home Access: Stairs to enter Secretary/administrator of Steps: 12 Bathroom Shower/Tub: Engineer, manufacturing systems: Standard Home Care Services: No  Discharge Living Setting Plans for Discharge Living Setting: Lives with (comment), Apartment(Lives with grand daughter.) Type of Home at Discharge: Apartment(2nd floor apartment.) Discharge Home Layout: One level(2nd level apartment.) Discharge Home Access: Stairs to enter Entrance Stairs-Number of Steps: 10-12 steps Does the patient have any problems obtaining your medications?: Yes (Describe)(Has no insurance to pay for medications.)  Social/Family/Support Systems Patient Roles: Other (Comment)(Has a daughter and 5 grand children.) Contact Information: Dyanne Carrel - niece - [phone] Anticipated Caregiver: Hayes Ludwig and daughter Ability/Limitations of Caregiver: Daughter and grand daughter work.  Grandson can stay with patient while family works. Caregiver Availability: 24/7 Discharge Plan Discussed with Primary Caregiver: Yes Is Caregiver In Agreement with Plan?: Yes Does Caregiver/Family have Issues with Lodging/Transportation while Pt is in Rehab?: No  Goals/Additional Needs Patient/Family Goal for Rehab: PT supervision, OT supervision to min assist, SLP supervision goals Expected length of stay: 7-10 days Cultural Considerations: Speaks Spanish, will need interpreter, is Catholic Dietary Needs: Dys 1, nectar thick liquids Equipment Needs: TBD Pt/Family Agrees to Admission and willing to participate: Yes Program Orientation Provided & Reviewed with Pt/Caregiver  Including Roles  & Responsibilities: Yes  Decrease burden of Care through IP rehab admission: N/A  Possible need for SNF placement upon discharge: Not anticipated  Patient Condition: This patient's condition remains as documented in the consult dated 12/19/17, in which the Rehabilitation Physician determined and documented that the patient's condition is appropriate for intensive rehabilitative care in an inpatient rehabilitation facility. Will admit to inpatient rehab today.  Preadmission Screen Completed By:  [name], 12/19/2017 2:24 PM ______________________________________________________________________   Discussed status with Dr. Riley Kill on 12/19/17 at 1424 and received telephone approval for admission today.  Admission Coordinator:  [name], time 1424/Date 12/19/17

## 2017-12-19 NOTE — Progress Notes (Signed)
Referring Physician(s): Aroor, Georgiana Spinner R  Supervising Physician: Julieanne Cotton  Patient Status:  [facility] - In-pt  Chief Complaint:  RMCA angioplasty/stenting  Subjective:  Post-op day 1 s/p RMCA angioplasty/stenting on 12/18/2017. Alert and aware, responds appropriately. Complains of R supraorbital/temporal H/A, remains unchanged from yesterday. Increase in motor function of LUE and LLE. Distal pulses intact. Right groin incision c/d/i.  Allergies: Patient has no known allergies.  Medications: Prior to Admission medications   Not on File     Vital Signs: BP 111/63   Pulse 74   Temp 98.5 F (36.9 C) (Oral)   Resp 14   Wt 265 lb 3.4 oz (120.3 kg)   SpO2 98%   BMI 40.33 kg/m   Physical Exam  Constitutional: She is oriented to person, place, and time. She appears well-developed and well-nourished. No distress.  Cardiovascular: Normal rate, regular rhythm, normal heart sounds and intact distal pulses.  No murmur heard. Pulmonary/Chest: Effort normal and breath sounds normal. She has no wheezes.  Neurological: She is alert and oriented to person, place, and time.  Increased motor function of LUE and LLE.  Skin: Skin is warm and dry.  Right groin incision c/d/i without hematoma or active bleeding.  Psychiatric: She has a normal mood and affect. Her behavior is normal. Judgment and thought content normal.  Responds to questions appropriately.  Nursing note and vitals reviewed.   Imaging: Ct Angio Head W Or Wo Contrast  Result Date: 12/18/2017 CLINICAL DATA:  Initial evaluation for acute left-sided deficits. EXAM: CT ANGIOGRAPHY HEAD AND NECK TECHNIQUE: Multidetector CT imaging of the head and neck was performed using the standard protocol during bolus administration of intravenous contrast. Multiplanar CT image reconstructions and MIPs were obtained to evaluate the vascular anatomy. Carotid stenosis measurements (when applicable) are obtained utilizing NASCET  criteria, using the distal internal carotid diameter as the denominator. CONTRAST:  50 cc of Isovue 370. COMPARISON:  Prior CT from earlier the same day. FINDINGS: CTA NECK FINDINGS Aortic arch: Visualized aortic arch of normal caliber with normal branch pattern. No high-grade stenosis about the origin of the great vessels. Visualized subclavian arteries widely patent. Right carotid system: Right common carotid artery widely patent to the bifurcation without stenosis. Right ICA widely patent from the bifurcation to the skull base without stenosis, dissection, or occlusion. Medial station of the right carotid artery system into the retropharyngeal space noted. No significant atheromatous narrowing about the right carotid bifurcation. Left carotid system: Left common and internal carotid arteries are widely patent without stenosis, dissection, or occlusion. No significant atheromatous narrowing about the left carotid bifurcation. Vertebral arteries: Both of the vertebral arteries arise from the subclavian arteries. Left vertebral artery dominant with a diffusely hypoplastic right vertebral artery. Vertebral arteries widely patent within the neck without stenosis, dissection, or occlusion. Skeleton: No acute osseus abnormality. No worrisome lytic or blastic osseous lesions. Other neck: No acute soft tissue abnormality within the neck. Salivary glands normal. Thyroid within normal limits. No adenopathy. Upper chest: Visualized upper chest within normal limits. Visualized upper lungs are grossly clear. Few tiny tree-in-bud nodular densities noted at the medial left upper lobe, favored to reflect mucoid impaction, of doubtful significance. Review of the MIP images confirms the above findings CTA HEAD FINDINGS Anterior circulation: Petrous, cavernous, and supraclinoid segments of the internal carotid arteries are widely patent without flow-limiting stenosis. ICA termini widely patent. Dominant right A1 segment widely  patent. Left A1 segment diffusely hypoplastic, accounting for the slightly diminutive left  ICA is compared to the right. Widely patent and normal anterior communicating artery. Anterior cerebral arteries well perfused to their distal aspects without stenosis. Focal irregularity with mild to moderate stenosis noted at the mid right M1 segment (series 8, image 101). Right M1 patent distally. There is abrupt near occlusion of a proximal right M2 branch, middle division (series 11, image 20). Right M2 immediately branches just distally, with markedly thready and irregular flow within the M3 and M4 segments distally (series 8, image 90). Finding suspicious for possible subocclusive thrombus. A superimposed high-grade stenosis may be contributory as well. Overall, there is decreased perfusion with poor collateralization within the distal right MCA territory as compared to the left (series 11, image 14). Left M1 segment widely patent without stenosis or occlusion. Normal left MCA bifurcation. No proximal left M2 occlusion. Distal left MCA branches well perfused. Mild distal small vessel atheromatous irregularity. Posterior circulation: Vertebral arteries patent to the vertebrobasilar junction without stenosis. Left vertebral artery dominant. Patent left PICA. Right PICA not well visualized. Basilar artery widely patent to its distal aspect. Superior cerebral arteries patent bilaterally. Both of the posterior cerebral arteries primarily supplied via the basilar. Short-segment mild to moderate proximal left P1 stenosis. Mild atheromatous irregularity within the bilateral P2 segments and distal PCAs without high-grade stenosis. Bilateral posterior communicating arteries noted, right larger than left. Venous sinuses: Patent. Anatomic variants: Hypoplastic left A1 segment. Dominant left vertebral artery. No intracranial aneurysm. Delayed phase: Not performed. Review of the MIP images confirms the above findings IMPRESSION: 1.  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 Finding suspicious for near occlusive/subocclusive thrombus. Underlying stenosis may be contributory as well. 2. Irregular approximate 50% moderate mid right M1 stenosis. 3. Mild distal small vessel atheromatous irregularity. Critical Value/emergent results were called by telephone at the time of interpretation on 12/17/2017 at 11:35 pm to Dr. Arther Dames , who verbally acknowledged these results. Electronically Signed   By: Rise Mu M.D.   On: 12/18/2017 00:07   Ct Head Wo Contrast  Result Date: 12/18/2017 CLINICAL DATA:  62 year old female code stroke status post endovascular treatment earlier today. EXAM: CT HEAD WITHOUT CONTRAST TECHNIQUE: Contiguous axial images were obtained from the base of the skull through the vertex without intravenous contrast. COMPARISON:  CTA head and neck and noncontrast head CT 12/17/2016. Cerebral angiogram 0400 hours today. FINDINGS: Brain: Cytotoxic edema has developed in the right insula and operculum in an area of about 3 centimeters (series 3, images 14-16). There is mild associated mass effect on the right lateral ventricle. No midline shift. No acute intracranial hemorrhage identified. No ventriculomegaly. Stable and normal gray-white matter differentiation in the right basal ganglia. Stable gray-white matter differentiation elsewhere in the right MCA territory. The left hemisphere and posterior fossa remain normal. Partially empty sella re- demonstrated. Vascular: Vascular stent present from the right M1 segment into the posterior right MCA division. Skull: Stable, negative. Sinuses/Orbits: Stable. Mild bubbly opacity in the right sphenoid sinus. Other: There is no longer rightward gaze deviation. Visualized orbits and scalp soft tissues are within normal limits. IMPRESSION: 1. Right MCA territory infarct now apparent affecting the insula and operculum.  Cytotoxic edema affecting an area of about 3 cm. No associated hemorrhage. 2. Mild regional mass effect with no midline shift. 3. Right MCA vascular stent in place. Electronically Signed   By: Odessa Fleming M.D.   On: 12/18/2017 14:44   Ct Angio Neck W Or Wo Contrast  Result Date:  12/18/2017 CLINICAL DATA:  Initial evaluation for acute left-sided deficits. EXAM: CT ANGIOGRAPHY HEAD AND NECK TECHNIQUE: Multidetector CT imaging of the head and neck was performed using the standard protocol during bolus administration of intravenous contrast. Multiplanar CT image reconstructions and MIPs were obtained to evaluate the vascular anatomy. Carotid stenosis measurements (when applicable) are obtained utilizing NASCET criteria, using the distal internal carotid diameter as the denominator. CONTRAST:  50 cc of Isovue 370. COMPARISON:  Prior CT from earlier the same day. FINDINGS: CTA NECK FINDINGS Aortic arch: Visualized aortic arch of normal caliber with normal branch pattern. No high-grade stenosis about the origin of the great vessels. Visualized subclavian arteries widely patent. Right carotid system: Right common carotid artery widely patent to the bifurcation without stenosis. Right ICA widely patent from the bifurcation to the skull base without stenosis, dissection, or occlusion. Medial station of the right carotid artery system into the retropharyngeal space noted. No significant atheromatous narrowing about the right carotid bifurcation. Left carotid system: Left common and internal carotid arteries are widely patent without stenosis, dissection, or occlusion. No significant atheromatous narrowing about the left carotid bifurcation. Vertebral arteries: Both of the vertebral arteries arise from the subclavian arteries. Left vertebral artery dominant with a diffusely hypoplastic right vertebral artery. Vertebral arteries widely patent within the neck without stenosis, dissection, or occlusion. Skeleton: No acute osseus  abnormality. No worrisome lytic or blastic osseous lesions. Other neck: No acute soft tissue abnormality within the neck. Salivary glands normal. Thyroid within normal limits. No adenopathy. Upper chest: Visualized upper chest within normal limits. Visualized upper lungs are grossly clear. Few tiny tree-in-bud nodular densities noted at the medial left upper lobe, favored to reflect mucoid impaction, of doubtful significance. Review of the MIP images confirms the above findings CTA HEAD FINDINGS Anterior circulation: Petrous, cavernous, and supraclinoid segments of the internal carotid arteries are widely patent without flow-limiting stenosis. ICA termini widely patent. Dominant right A1 segment widely patent. Left A1 segment diffusely hypoplastic, accounting for the slightly diminutive left ICA is compared to the right. Widely patent and normal anterior communicating artery. Anterior cerebral arteries well perfused to their distal aspects without stenosis. Focal irregularity with mild to moderate stenosis noted at the mid right M1 segment (series 8, image 101). Right M1 patent distally. There is abrupt near occlusion of a proximal right M2 branch, middle division (series 11, image 20). Right M2 immediately branches just distally, with markedly thready and irregular flow within the M3 and M4 segments distally (series 8, image 90). Finding suspicious for possible subocclusive thrombus. A superimposed high-grade stenosis may be contributory as well. Overall, there is decreased perfusion with poor collateralization within the distal right MCA territory as compared to the left (series 11, image 14). Left M1 segment widely patent without stenosis or occlusion. Normal left MCA bifurcation. No proximal left M2 occlusion. Distal left MCA branches well perfused. Mild distal small vessel atheromatous irregularity. Posterior circulation: Vertebral arteries patent to the vertebrobasilar junction without stenosis. Left vertebral  artery dominant. Patent left PICA. Right PICA not well visualized. Basilar artery widely patent to its distal aspect. Superior cerebral arteries patent bilaterally. Both of the posterior cerebral arteries primarily supplied via the basilar. Short-segment mild to moderate proximal left P1 stenosis. Mild atheromatous irregularity within the bilateral P2 segments and distal PCAs without high-grade stenosis. Bilateral posterior communicating arteries noted, right larger than left. Venous sinuses: Patent. Anatomic variants: Hypoplastic left A1 segment. Dominant left vertebral artery. No intracranial aneurysm. Delayed phase: Not performed. Review  of the MIP images confirms the above findings IMPRESSION: 1. 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 Finding suspicious for near occlusive/subocclusive thrombus. Underlying stenosis may be contributory as well. 2. Irregular approximate 50% moderate mid right M1 stenosis. 3. Mild distal small vessel atheromatous irregularity. Critical Value/emergent results were called by telephone at the time of interpretation on 12/17/2017 at 11:35 pm to Dr. Arther Dames , who verbally acknowledged these results. Electronically Signed   By: Rise Mu M.D.   On: 12/18/2017 00:07   Dg Chest Port 1 View  Result Date: 12/18/2017 CLINICAL DATA:  Status post acute CVA, current smoker. EXAM: PORTABLE CHEST 1 VIEW COMPARISON:  Chest x-ray of September 12, 2016 FINDINGS: The lungs are well-expanded and clear. The interstitial markings are mildly prominent though stable. The heart is top-normal in size. The pulmonary vascularity is normal. The mediastinum is normal in width. IMPRESSION: There is no acute cardiopulmonary abnormality. No evidence of aspiration pneumonia. Electronically Signed   By: David  [place] M.D.   On: 12/18/2017 08:36   Ct Head Code Stroke Wo Contrast  Result Date: 12/17/2017 CLINICAL DATA:  Code stroke.  Initial evaluation for acute left-sided deficits. EXAM: CT HEAD WITHOUT CONTRAST TECHNIQUE: Contiguous axial images were obtained from the base of the skull through the vertex without intravenous contrast. COMPARISON:  Prior CT from 09/01/2013. FINDINGS: Brain: Cerebral volume within normal limits for age. No acute intracranial hemorrhage. No acute large vessel territory infarct. Tiny remote right basal ganglia lacunar infarct noted. No mass lesion, midline shift or mass effect. No hydrocephalus. No extra-axial fluid collection. Vascular: No hyperdense vessel. Scattered vascular calcifications noted within the carotid siphons. Skull: Scalp soft tissues and calvarium demonstrate no acute abnormality. Sinuses/Orbits: Globes and orbital soft tissues within normal limits. Partial gas fixation of the lateral recess of the right sphenoid sinus noted. Paranasal sinuses are otherwise clear. No mastoid effusion. Other: None. ASPECTS [facility] Stroke Program Early CT Score) - Ganglionic level infarction (caudate, lentiform nuclei, internal capsule, insula, M1-M3 cortex): 7 - Supraganglionic infarction (M4-M6 cortex): 3 Total score (0-10 with 10 being normal): 10 IMPRESSION: 1. No acute intracranial infarct or other process identified. 2. ASPECTS is 10. 3. Tiny remote right basal ganglia lacunar infarct. These results were communicated to Aroor at 11:11 pmon 3/17/2019by text page via the [facility] messaging system. Electronically Signed   By: Rise Mu M.D.   On: 12/17/2017 23:14    Labs:  CBC: Recent Labs    10/18/17 1614 12/17/17 2255 12/17/17 2300 12/18/17 0405 12/19/17 0513  WBC 14.4* 8.0  --  9.8 10.9*  HGB 15.6* 13.1 14.3 12.3 12.3  HCT 47.1* 40.9 42.0 37.6 37.8  PLT 311 247  --  226 237    COAGS: Recent Labs    12/17/17 2255  INR 0.89  APTT 28    BMP: Recent Labs    10/18/17 1614 12/17/17 2255 12/17/17 2300 12/18/17 0405 12/19/17 0513  NA 134* 138 142 139 138  K 4.5 4.1 4.1 4.4  3.5  CL 100* 105 104 110 104  CO2 26 25  --  21* 25  GLUCOSE 141* 204* 202* 214* 184*  BUN 15 13 15 10  <5*  CALCIUM 9.1 9.0  --  7.7* 8.6*  CREATININE 0.48 0.57 0.50 0.54 0.39*  GFRNONAA >60 >60  --  >60 >60  GFRAA >60 >60  --  >60 >60    LIVER FUNCTION TESTS: Recent Labs    10/18/17 1614 12/17/17  2255  BILITOT 0.5 0.3  AST 26 21  ALT 26 23  ALKPHOS 97 76  PROT 7.6 6.5  ALBUMIN 4.2 3.5    Assessment and Plan:  RMCA angioplasty/stenting Post-op day 1. Increase in motor function. Groin intact. Continue ASA once a day and Brilinta twice a day as directed. Appreciate Neurology management.  Electronically Signed: Elwin Mocha, PA-C 12/19/2017, 9:24 AM   I spent a total of 15 Minutes at the the patient's bedside AND on the patient's hospital floor or unit, greater than 50% of which was counseling/coordinating care for [facility] angioplasty/stenting.

## 2017-12-19 NOTE — Care Management Note (Signed)
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  PTA, pt independent, lives at home with granddaughter.                  Action/Plan: PT/OT recommending CIR and pt has been accepted for admission today.  Pt started on Brilinta and has no insurance.  Gave daughter 30 day free trial card.  Will handoff to CIR Case manager that pt will need assistance with Brilinta; perhaps samples, or enrollment in patient assistance program.    Expected Discharge Date:  12/19/17               Expected Discharge Plan:  IP Rehab Facility  In-House Referral:     Discharge planning Services  CM Consult  Post Acute Care Choice:    Choice offered to:     DME Arranged:    DME Agency:     HH Arranged:    HH Agency:     Status of Service:  Completed, signed off  If discussed at [employer], dates discussed:    Additional Comments:  Quintella Baton, RN, BSN  Trauma/Neuro ICU Case Manager [phone]

## 2017-12-19 NOTE — Progress Notes (Addendum)
Inpatient Rehabilitation  Per SLP request, patient was screened by Fae Pippin for appropriateness for an Inpatient Acute Rehab consult.  Would recommend to follow along for OT/PT recommendations prior to requesting an Inpatient Rehab consult; however, note that one has already been place.  Plan for an Admission's Coordinator to follow up after completion of the consult.     Charlane Ferretti., CCC/SLP Admission Coordinator  [facility]  Cell [phone]

## 2017-12-19 NOTE — Consult Note (Signed)
Physical Medicine and Rehabilitation Consult Reason for Consult:Left side weakness with dysarthria Referring Physician: Dr. Pearlean Brownie   HPI: Heather [name] is a 62 y.o.right handed Spanish-speaking female with history of hypertension tobacco abuse. On no scheduled medications.Patient lives with grandaughter.Independent prior to admission. Grandaughter works during the day. 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 No associated hemorrhage. Mild regional mass effect with no midline shift.Patient did not receive TPA. CT angiogram head and neck showed abrupt proximal right M2 occlusion/ near occlusion.Underwent mechanical thrombectomy per interventional radiology.Echocardiogram is pending. Currently maintained on aspirin as well as Brilinta for CVA prophylaxis. Close monitoring of blood pressure. Physical and occupational therapy evaluations are pending. M.D. Has requested physical medicine rehabilitation consult.   Review of Systems  Unable to perform ROS: Acuity of condition   Past Medical History:  Diagnosis Date  . Hypertension    Past Surgical History:  Procedure Laterality Date  . BREAST EXCISIONAL BIOPSY    . BREAST SURGERY     right breast  . CHOLECYSTECTOMY     Family History  Problem Relation Age of Onset  . Diabetes Mother   . Pulmonary disease Mother   . Heart disease Father    Social History:  reports that she has been smoking cigarettes.  She has a 15.00 pack-year smoking history. she has never used smokeless tobacco. She reports that she drinks alcohol. She reports that she does not use drugs. Allergies: No Known Allergies No medications prior to admission.    Home:    Functional History:   Functional Status:  Mobility:          ADL:    Cognition: Cognition Orientation Level: Oriented X4    Blood pressure 111/63, pulse 74, temperature 98.5 F  (36.9 C), temperature source Oral, resp. rate 14, weight 120.3 kg (265 lb 3.4 oz), SpO2 98 %. Physical Exam  Vitals reviewed. Constitutional: She appears well-developed.  Eyes:  Pupils round and reactive to light  Neck: Normal range of motion. Neck supple. No thyromegaly present.  Cardiovascular: Normal rate, regular rhythm and normal heart sounds.  Respiratory: Effort normal and breath sounds normal. No respiratory distress.  GI: Soft. Bowel sounds are normal. She exhibits no distension.  Neurological: She is alert.  Right gaze preference. Limited English speaking. Followed simple commands. LUE 2-/5 prox to distal and LLE 2 to 3/5 prox to distal. Senses pain in left upper and lower. Left central 7 and tongue deviation.   Skin: Skin is warm and dry.  Psychiatric: She has a normal mood and affect. Her behavior is normal.    Results for orders placed or performed during the hospital encounter of 12/17/17 (from the past 24 hour(s))  Basic metabolic panel     Status: Abnormal   Collection Time: 12/19/17  5:13 AM  Result Value Ref Range   Sodium 138 135 - 145 mmol/L   Potassium 3.5 3.5 - 5.1 mmol/L   Chloride 104 101 - 111 mmol/L   CO2 25 22 - 32 mmol/L   Glucose, Bld 184 (H) 65 - 99 mg/dL   BUN <5 (L) 6 - 20 mg/dL   Creatinine, Ser 7.41 (L) 0.44 - 1.00 mg/dL   Calcium 8.6 (L) 8.9 - 10.3 mg/dL   GFR calc non Af Amer >60 >60 mL/min   GFR calc Af Amer >60 >60 mL/min   Anion gap 9 5 - 15  CBC  Status: Abnormal   Collection Time: 12/19/17  5:13 AM  Result Value Ref Range   WBC 10.9 (H) 4.0 - 10.5 K/uL   RBC 4.01 3.87 - 5.11 MIL/uL   Hemoglobin 12.3 12.0 - 15.0 g/dL   HCT 24.2 68.3 - 41.9 %   MCV 94.3 78.0 - 100.0 fL   MCH 30.7 26.0 - 34.0 pg   MCHC 32.5 30.0 - 36.0 g/dL   RDW 62.2 29.7 - 98.9 %   Platelets 237 150 - 400 K/uL   Ct Angio Head W Or Wo Contrast  Result Date: 12/18/2017 CLINICAL DATA:  Initial evaluation for acute left-sided deficits. EXAM: CT ANGIOGRAPHY HEAD AND  NECK TECHNIQUE: Multidetector CT imaging of the head and neck was performed using the standard protocol during bolus administration of intravenous contrast. Multiplanar CT image reconstructions and MIPs were obtained to evaluate the vascular anatomy. Carotid stenosis measurements (when applicable) are obtained utilizing NASCET criteria, using the distal internal carotid diameter as the denominator. CONTRAST:  50 cc of Isovue 370. COMPARISON:  Prior CT from earlier the same day. FINDINGS: CTA NECK FINDINGS Aortic arch: Visualized aortic arch of normal caliber with normal branch pattern. No high-grade stenosis about the origin of the great vessels. Visualized subclavian arteries widely patent. Right carotid system: Right common carotid artery widely patent to the bifurcation without stenosis. Right ICA widely patent from the bifurcation to the skull base without stenosis, dissection, or occlusion. Medial station of the right carotid artery system into the retropharyngeal space noted. No significant atheromatous narrowing about the right carotid bifurcation. Left carotid system: Left common and internal carotid arteries are widely patent without stenosis, dissection, or occlusion. No significant atheromatous narrowing about the left carotid bifurcation. Vertebral arteries: Both of the vertebral arteries arise from the subclavian arteries. Left vertebral artery dominant with a diffusely hypoplastic right vertebral artery. Vertebral arteries widely patent within the neck without stenosis, dissection, or occlusion. Skeleton: No acute osseus abnormality. No worrisome lytic or blastic osseous lesions. Other neck: No acute soft tissue abnormality within the neck. Salivary glands normal. Thyroid within normal limits. No adenopathy. Upper chest: Visualized upper chest within normal limits. Visualized upper lungs are grossly clear. Few tiny tree-in-bud nodular densities noted at the medial left upper lobe, favored to reflect  mucoid impaction, of doubtful significance. Review of the MIP images confirms the above findings CTA HEAD FINDINGS Anterior circulation: Petrous, cavernous, and supraclinoid segments of the internal carotid arteries are widely patent without flow-limiting stenosis. ICA termini widely patent. Dominant right A1 segment widely patent. Left A1 segment diffusely hypoplastic, accounting for the slightly diminutive left ICA is compared to the right. Widely patent and normal anterior communicating artery. Anterior cerebral arteries well perfused to their distal aspects without stenosis. Focal irregularity with mild to moderate stenosis noted at the mid right M1 segment (series 8, image 101). Right M1 patent distally. There is abrupt near occlusion of a proximal right M2 branch, middle division (series 11, image 20). Right M2 immediately branches just distally, with markedly thready and irregular flow within the M3 and M4 segments distally (series 8, image 90). Finding suspicious for possible subocclusive thrombus. A superimposed high-grade stenosis may be contributory as well. Overall, there is decreased perfusion with poor collateralization within the distal right MCA territory as compared to the left (series 11, image 14). Left M1 segment widely patent without stenosis or occlusion. Normal left MCA bifurcation. No proximal left M2 occlusion. Distal left MCA branches well perfused. Mild distal small vessel atheromatous  irregularity. Posterior circulation: Vertebral arteries patent to the vertebrobasilar junction without stenosis. Left vertebral artery dominant. Patent left PICA. Right PICA not well visualized. Basilar artery widely patent to its distal aspect. Superior cerebral arteries patent bilaterally. Both of the posterior cerebral arteries primarily supplied via the basilar. Short-segment mild to moderate proximal left P1 stenosis. Mild atheromatous irregularity within the bilateral P2 segments and distal PCAs  without high-grade stenosis. Bilateral posterior communicating arteries noted, right larger than left. Venous sinuses: Patent. Anatomic variants: Hypoplastic left A1 segment. Dominant left vertebral artery. No intracranial aneurysm. Delayed phase: Not performed. Review of the MIP images confirms the above findings IMPRESSION: 1. 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 Finding suspicious for near occlusive/subocclusive thrombus. Underlying stenosis may be contributory as well. 2. Irregular approximate 50% moderate mid right M1 stenosis. 3. Mild distal small vessel atheromatous irregularity. Critical Value/emergent results were called by telephone at the time of interpretation on 12/17/2017 at 11:35 pm to Dr. Arther Dames , who verbally acknowledged these results. Electronically Signed   By: Rise Mu M.D.   On: 12/18/2017 00:07   Ct Head Wo Contrast  Result Date: 12/18/2017 CLINICAL DATA:  62 year old female code stroke status post endovascular treatment earlier today. EXAM: CT HEAD WITHOUT CONTRAST TECHNIQUE: Contiguous axial images were obtained from the base of the skull through the vertex without intravenous contrast. COMPARISON:  CTA head and neck and noncontrast head CT 12/17/2016. Cerebral angiogram 0400 hours today. FINDINGS: Brain: Cytotoxic edema has developed in the right insula and operculum in an area of about 3 centimeters (series 3, images 14-16). There is mild associated mass effect on the right lateral ventricle. No midline shift. No acute intracranial hemorrhage identified. No ventriculomegaly. Stable and normal gray-white matter differentiation in the right basal ganglia. Stable gray-white matter differentiation elsewhere in the right MCA territory. The left hemisphere and posterior fossa remain normal. Partially empty sella re- demonstrated. Vascular: Vascular stent present from the right M1 segment into the posterior  right MCA division. Skull: Stable, negative. Sinuses/Orbits: Stable. Mild bubbly opacity in the right sphenoid sinus. Other: There is no longer rightward gaze deviation. Visualized orbits and scalp soft tissues are within normal limits. IMPRESSION: 1. Right MCA territory infarct now apparent affecting the insula and operculum. Cytotoxic edema affecting an area of about 3 cm. No associated hemorrhage. 2. Mild regional mass effect with no midline shift. 3. Right MCA vascular stent in place. Electronically Signed   By: Odessa Fleming M.D.   On: 12/18/2017 14:44   Ct Angio Neck W Or Wo Contrast  Result Date: 12/18/2017 CLINICAL DATA:  Initial evaluation for acute left-sided deficits. EXAM: CT ANGIOGRAPHY HEAD AND NECK TECHNIQUE: Multidetector CT imaging of the head and neck was performed using the standard protocol during bolus administration of intravenous contrast. Multiplanar CT image reconstructions and MIPs were obtained to evaluate the vascular anatomy. Carotid stenosis measurements (when applicable) are obtained utilizing NASCET criteria, using the distal internal carotid diameter as the denominator. CONTRAST:  50 cc of Isovue 370. COMPARISON:  Prior CT from earlier the same day. FINDINGS: CTA NECK FINDINGS Aortic arch: Visualized aortic arch of normal caliber with normal branch pattern. No high-grade stenosis about the origin of the great vessels. Visualized subclavian arteries widely patent. Right carotid system: Right common carotid artery widely patent to the bifurcation without stenosis. Right ICA widely patent from the bifurcation to the skull base without stenosis, dissection, or occlusion. Medial station of the right carotid artery  system into the retropharyngeal space noted. No significant atheromatous narrowing about the right carotid bifurcation. Left carotid system: Left common and internal carotid arteries are widely patent without stenosis, dissection, or occlusion. No significant atheromatous  narrowing about the left carotid bifurcation. Vertebral arteries: Both of the vertebral arteries arise from the subclavian arteries. Left vertebral artery dominant with a diffusely hypoplastic right vertebral artery. Vertebral arteries widely patent within the neck without stenosis, dissection, or occlusion. Skeleton: No acute osseus abnormality. No worrisome lytic or blastic osseous lesions. Other neck: No acute soft tissue abnormality within the neck. Salivary glands normal. Thyroid within normal limits. No adenopathy. Upper chest: Visualized upper chest within normal limits. Visualized upper lungs are grossly clear. Few tiny tree-in-bud nodular densities noted at the medial left upper lobe, favored to reflect mucoid impaction, of doubtful significance. Review of the MIP images confirms the above findings CTA HEAD FINDINGS Anterior circulation: Petrous, cavernous, and supraclinoid segments of the internal carotid arteries are widely patent without flow-limiting stenosis. ICA termini widely patent. Dominant right A1 segment widely patent. Left A1 segment diffusely hypoplastic, accounting for the slightly diminutive left ICA is compared to the right. Widely patent and normal anterior communicating artery. Anterior cerebral arteries well perfused to their distal aspects without stenosis. Focal irregularity with mild to moderate stenosis noted at the mid right M1 segment (series 8, image 101). Right M1 patent distally. There is abrupt near occlusion of a proximal right M2 branch, middle division (series 11, image 20). Right M2 immediately branches just distally, with markedly thready and irregular flow within the M3 and M4 segments distally (series 8, image 90). Finding suspicious for possible subocclusive thrombus. A superimposed high-grade stenosis may be contributory as well. Overall, there is decreased perfusion with poor collateralization within the distal right MCA territory as compared to the left (series 11,  image 14). Left M1 segment widely patent without stenosis or occlusion. Normal left MCA bifurcation. No proximal left M2 occlusion. Distal left MCA branches well perfused. Mild distal small vessel atheromatous irregularity. Posterior circulation: Vertebral arteries patent to the vertebrobasilar junction without stenosis. Left vertebral artery dominant. Patent left PICA. Right PICA not well visualized. Basilar artery widely patent to its distal aspect. Superior cerebral arteries patent bilaterally. Both of the posterior cerebral arteries primarily supplied via the basilar. Short-segment mild to moderate proximal left P1 stenosis. Mild atheromatous irregularity within the bilateral P2 segments and distal PCAs without high-grade stenosis. Bilateral posterior communicating arteries noted, right larger than left. Venous sinuses: Patent. Anatomic variants: Hypoplastic left A1 segment. Dominant left vertebral artery. No intracranial aneurysm. Delayed phase: Not performed. Review of the MIP images confirms the above findings IMPRESSION: 1. 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 Finding suspicious for near occlusive/subocclusive thrombus. Underlying stenosis may be contributory as well. 2. Irregular approximate 50% moderate mid right M1 stenosis. 3. Mild distal small vessel atheromatous irregularity. Critical Value/emergent results were called by telephone at the time of interpretation on 12/17/2017 at 11:35 pm to Dr. Arther Dames , who verbally acknowledged these results. Electronically Signed   By: Rise Mu M.D.   On: 12/18/2017 00:07   Dg Chest Port 1 View  Result Date: 12/18/2017 CLINICAL DATA:  Status post acute CVA, current smoker. EXAM: PORTABLE CHEST 1 VIEW COMPARISON:  Chest x-ray of September 12, 2016 FINDINGS: The lungs are well-expanded and clear. The interstitial markings are mildly prominent though stable. The heart is top-normal in  size. The pulmonary vascularity is normal. The  mediastinum is normal in width. IMPRESSION: There is no acute cardiopulmonary abnormality. No evidence of aspiration pneumonia. Electronically Signed   By: David  [place] M.D.   On: 12/18/2017 08:36   Ct Head Code Stroke Wo Contrast  Result Date: 12/17/2017 CLINICAL DATA:  Code stroke. Initial evaluation for acute left-sided deficits. EXAM: CT HEAD WITHOUT CONTRAST TECHNIQUE: Contiguous axial images were obtained from the base of the skull through the vertex without intravenous contrast. COMPARISON:  Prior CT from 09/01/2013. FINDINGS: Brain: Cerebral volume within normal limits for age. No acute intracranial hemorrhage. No acute large vessel territory infarct. Tiny remote right basal ganglia lacunar infarct noted. No mass lesion, midline shift or mass effect. No hydrocephalus. No extra-axial fluid collection. Vascular: No hyperdense vessel. Scattered vascular calcifications noted within the carotid siphons. Skull: Scalp soft tissues and calvarium demonstrate no acute abnormality. Sinuses/Orbits: Globes and orbital soft tissues within normal limits. Partial gas fixation of the lateral recess of the right sphenoid sinus noted. Paranasal sinuses are otherwise clear. No mastoid effusion. Other: None. ASPECTS [facility] Stroke Program Early CT Score) - Ganglionic level infarction (caudate, lentiform nuclei, internal capsule, insula, M1-M3 cortex): 7 - Supraganglionic infarction (M4-M6 cortex): 3 Total score (0-10 with 10 being normal): 10 IMPRESSION: 1. No acute intracranial infarct or other process identified. 2. ASPECTS is 10. 3. Tiny remote right basal ganglia lacunar infarct. These results were communicated to [place] at 11:11 pmon 3/17/2019by text page via the [facility] messaging system. Electronically Signed   By: Rise Mu M.D.   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 Does  the need for close, 24 hr/day medical supervision in concert with the patient's rehab needs make it unreasonable for this patient to be served in a less intensive setting? Yes 2. Co-Morbidities requiring supervision/potential complications: HTN, post-stroke sequelae 3. Due to bladder management, bowel management, safety, skin/wound care, disease management, medication administration, pain management and patient education, does the patient require 24 hr/day rehab nursing? Yes 4. Does the patient require coordinated care of a physician, rehab nurse, PT (1-2 hrs/day, 5 days/week), OT (1-2 hrs/day, 5 days/week) and SLP (1-2 hrs/day, 5 days/week) to address physical and functional deficits in the context of the above medical diagnosis(es)? Yes Addressing deficits in the following areas: balance, endurance, locomotion, strength, transferring, bowel/bladder control, bathing, dressing, feeding, grooming, toileting, cognition, speech, swallowing and psychosocial support 5. Can the patient actively participate in an intensive therapy program of at least 3 hrs of therapy per day at least 5 days per week? Yes 6. The potential for patient to make measurable gains while on inpatient rehab is excellent 7. Anticipated functional outcomes upon discharge from inpatient rehab are supervision  with PT, supervision and min assist with OT, supervision with SLP. 8. Estimated rehab length of stay to reach the above functional goals is: 13-20 days 9. Anticipated D/C setting: Home 10. Anticipated post D/C treatments: HH therapy 11. Overall Rehab/Functional Prognosis: excellent  RECOMMENDATIONS: This patient's condition is appropriate for continued rehabilitative care in the following setting: CIR Patient has agreed to participate in recommended program. Yes Note that insurance prior authorization may be required for reimbursement for recommended care.  Comment: Rehab Admissions Coordinator to follow  up.  Thanks,  Ranelle Oyster, MD, [place] [facility]    Mcarthur Rossetti Angiulli, PA-C 12/19/2017

## 2017-12-19 NOTE — Progress Notes (Signed)
  Speech Language Pathology Treatment: Dysphagia  Patient Details Name: Heather [name] MRN: [id number] DOB: 10-07-1955 Today's Date: 12/19/2017 Time: 1000-1015 SLP Time Calculation (min) (ACUTE ONLY): 15 min  Assessment / Plan / Recommendation Clinical Impression  Pt demonstrates improving arousal. Oral deficits and suspect deficits in timing in swallowing given immediate coughing following consecutive sips of thin liquids and minimal signs of pooling in the left buccal cavity. Tolerated nectar thick liquids without signs of aspiration. Verbal cues for second swallow given to clear oral residuals. Will initiate nectar thick liquids and puree with cautions and supervision. Daughter and pt aware. Will f/u for tolerance and cognitive linguistic assessment.   HPI HPI: [name] with PMH of HTN,Obesity who presents [facility] emergency room as a stroke alert with left hemiparesis. Initial CT without acute infarct, tiny remote right basal ganglia infarct. s/p rt common carotid arteriogram 3/18 with revascularization/stenting of right MCA stenosis. 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       SLP Plan  Continue with current plan of care       Recommendations  Diet recommendations: Dysphagia 1 (puree);Nectar-thick liquid Liquids provided via: Cup;Straw Medication Administration: Whole meds with puree Supervision: Patient able to self feed Compensations: Monitor for anterior loss;Multiple dry swallows after each bite/sip                Oral Care Recommendations: Oral care QID Follow up Recommendations: Inpatient Rehab SLP Visit Diagnosis: Dysphagia, oropharyngeal phase (R13.12) Plan: Continue with current plan of care       GO               [facility], MA CCC-SLP [phone]  Claudine Mouton 12/19/2017, 10:34 AM

## 2017-12-19 NOTE — Evaluation (Signed)
Physical Therapy Evaluation Patient Details Name: Heather [name] MRN: [id number] DOB: 28-Aug-1956 Today's Date: 12/19/2017   History of Present Illness  PMH of HTN,Obesity admitted with left hemiparesis. RT MCA CVA. s/p rt common carotid arteriogram 3/18 with revascularization/stenting of right MCA stenosis.   Clinical Impression  Pt pleasant and willing to mobilize but reports HA throughout session. Pt able to move left side but notable weakness RUE>RLE with impaired vision, problem solving and function. Pt will benefit from acute therapy to maximize mobility, gait, balance, function and safety to decrease burden of care. Pt lives with granddaughter who works but grandson can be available during the day. Will continue to follow and recommend OOB daily with nursing staff.   BP 100/70 in chair     Follow Up Recommendations CIR;Supervision/Assistance - 24 hour    Equipment Recommendations  Rolling walker with 5" wheels    Recommendations for Other Services Rehab consult     Precautions / Restrictions Precautions Precautions: Fall Restrictions Weight Bearing Restrictions: No      Mobility  Bed Mobility Overal bed mobility: Needs Assistance Bed Mobility: Supine to Sit     Supine to sit: Min assist     General bed mobility comments: assist for lines with pt using momentum to elevate trunk with RUE assist to fully raise trunk from surface and increased time, cues to scoot to EOB  Transfers Overall transfer level: Needs assistance   Transfers: Sit to/from Stand Sit to Stand: Min guard         General transfer comment: guarding for balance to stand, decreased contol of descent with close guarding to sit to prevent fall  Ambulation/Gait Ambulation/Gait assistance: Min guard Ambulation Distance (Feet): 100 Feet Assistive device: None Gait Pattern/deviations: Step-through pattern;Decreased stride length   Gait velocity interpretation: Below normal speed for  age/gender General Gait Details: pt with decreased speed and stability with gait with ability to walk short distance slowly and cautiously without UE assist, cues for direction and room  Stairs            Wheelchair Mobility    Modified Rankin (Stroke Patients Only) Modified Rankin (Stroke Patients Only) Pre-Morbid Rankin Score: No symptoms Modified Rankin: Moderately severe disability     Balance Overall balance assessment: Needs assistance   Sitting balance-Leahy Scale: Fair       Standing balance-Leahy Scale: Good                               Pertinent Vitals/Pain Pain Assessment: 0-10 Pain Score: 8  Pain Location: HA Pain Descriptors / Indicators: Aching Pain Intervention(s): Limited activity within patient's tolerance;Repositioned;Monitored during session    Home Living Family/patient expects to be discharged to:: Private residence Living Arrangements: Other relatives(granddaughter) Available Help at Discharge: Family;Available 24 hours/day Type of Home: Apartment Home Access: Stairs to enter   Entrance Stairs-Number of Steps: 12 Home Layout: One level Home Equipment: None      Prior Function Level of Independence: Independent         Comments: babysits 5 kids and likes to drink beer     Hand Dominance        Extremity/Trunk Assessment   Upper Extremity Assessment Upper Extremity Assessment: Defer to OT evaluation    Lower Extremity Assessment Lower Extremity Assessment: LLE deficits/detail LLE Deficits / Details: 3/5 hip flexion, knee flexion and extension. pt reports equal sensation    Cervical / Trunk Assessment Cervical / Trunk Assessment:  Normal  Communication   Communication: Prefers language other than English(spanish, daughter present with pt request for her to interpret)  Cognition Arousal/Alertness: Awake/alert Behavior During Therapy: WFL for tasks assessed/performed Overall Cognitive Status:  Impaired/Different from baseline Area of Impairment: Following commands;Safety/judgement;Problem solving                       Following Commands: Follows one step commands consistently Safety/Judgement: Decreased awareness of deficits   Problem Solving: Requires verbal cues General Comments: pt with difficulty with reading clock, dropping LUE off armrest and only scanning to right for room numbers      General Comments      Exercises     Assessment/Plan    PT Assessment Patient needs continued PT services  PT Problem List Decreased strength;Decreased mobility;Decreased safety awareness;Decreased activity tolerance;Decreased balance;Decreased cognition       PT Treatment Interventions Gait training;Therapeutic exercise;Patient/family education;Stair training;Balance training;Functional mobility training;Neuromuscular re-education;DME instruction;Therapeutic activities    PT Goals (Current goals can be found in the Care Plan section)  Acute Rehab PT Goals Patient Stated Goal: return home PT Goal Formulation: With patient/family Time For Goal Achievement: 01/02/18 Potential to Achieve Goals: Good    Frequency Min 3X/week   Barriers to discharge        Co-evaluation PT/OT/SLP Co-Evaluation/Treatment: Yes Reason for Co-Treatment: Complexity of the patient's impairments (multi-system involvement);For patient/therapist safety PT goals addressed during session: Mobility/safety with mobility         AM-PAC PT "6 Clicks" Daily Activity  Outcome Measure Difficulty turning over in bed (including adjusting bedclothes, sheets and blankets)?: A Little Difficulty moving from lying on back to sitting on the side of the bed? : A Lot Difficulty sitting down on and standing up from a chair with arms (e.g., wheelchair, bedside commode, etc,.)?: A Little Help needed moving to and from a bed to chair (including a wheelchair)?: A Little Help needed walking in hospital room?: A  Little Help needed climbing 3-5 steps with a railing? : A Lot 6 Click Score: 16    End of Session Equipment Utilized During Treatment: Gait belt Activity Tolerance: Patient tolerated treatment well Patient left: in chair;with call bell/phone within reach;with chair alarm set;with family/visitor present Nurse Communication: Mobility status PT Visit Diagnosis: Other abnormalities of gait and mobility (R26.89);Muscle weakness (generalized) (M62.81);Other symptoms and signs involving the nervous system (R29.898)    Time: 1962-2297 PT Time Calculation (min) (ACUTE ONLY): 26 min   Charges:   PT Evaluation $PT Eval Moderate Complexity: 1 Mod     PT G Codes:        Delaney Meigs, PT [phone]   Jaloni Davoli B Charniece Venturino 12/19/2017, 11:09 AM

## 2017-12-19 NOTE — Progress Notes (Signed)
Patient ID: Heather [name], female   DOB: Aug 22, 1956, 62 y.o.   MRN: [id number] Admit to unit via bed. Oriented to unit, plan of care and rehab therapy schedule. Dtr also educated on rehab routine and meds, labs, etc. d1 diet and nectar thick liquids. States an understanding of information. Pamelia Hoit

## 2017-12-19 NOTE — Progress Notes (Signed)
C/O epigastric discomfort. T/O for Maalox received. Maalox admin. Patient resting quietly without s/s of discomfort at this time.

## 2017-12-19 NOTE — H&P (Signed)
Physical Medicine and Rehabilitation Admission H&P    Chief Complaint  Patient presents with  . Code Stroke  : HPI: Heather [name] is a 62 y.o.right handed Spanish-speaking female with history of hypertension tobacco abuse. On no scheduled medications.Patient lives with grandaughter.Independent prior to admission. Grandaughter works during the day. 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 No associated hemorrhage. Mild regional mass effect with no midline shift.Patient did not receive TPA. CT angiogram head and neck showed abrupt proximal right M2 occlusion/ near occlusion.Underwent mechanical thrombectomy per interventional radiology.Echocardiogram is pending.MRI pending. Currently maintained on aspirin as well as Brilinta for CVA prophylaxis. Close monitoring of blood pressure.Dysphagia #1 nectar thick liquid diet. Physical  therapy evaluations completed  M.D. Has requested physical medicine rehabilitation consult.Patient was admitted for comprehensive rehabilitation program    Review of Systems  Unable to perform ROS: Language   Past Medical History:  Diagnosis Date  . Hypertension    Past Surgical History:  Procedure Laterality Date  . BREAST EXCISIONAL BIOPSY    . BREAST SURGERY     right breast  . CHOLECYSTECTOMY     Family History  Problem Relation Age of Onset  . Diabetes Mother   . Pulmonary disease Mother   . Heart disease Father    Social History:  reports that she has been smoking cigarettes.  She has a 15.00 pack-year smoking history. she has never used smokeless tobacco. She reports that she drinks alcohol. She reports that she does not use drugs. Allergies: No Known Allergies No medications prior to admission.    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 likes to drink beer  Functional Status:  Mobility: Bed Mobility Overal bed mobility: Needs Assistance Bed Mobility: Supine to Sit Supine to sit: Min assist General bed mobility comments: assist for lines with pt using momentum to elevate trunk with RUE assist to fully raise trunk from surface and increased time, cues to scoot to EOB Transfers Overall transfer level: Needs assistance Transfers: Sit to/from Stand Sit to Stand: Min guard General transfer comment: guarding for balance to stand, decreased contol of descent with close guarding to sit to prevent fall Ambulation/Gait Ambulation/Gait assistance: Min guard Ambulation Distance (Feet): 100 Feet Assistive device: None Gait Pattern/deviations: Step-through pattern, Decreased stride length General Gait Details: pt with decreased speed and stability with gait with ability to walk short distance slowly and cautiously without UE assist, cues for direction and room Gait velocity interpretation: Below normal speed for age/gender    ADL:    Cognition: Cognition Overall Cognitive Status: Impaired/Different from baseline Orientation Level: Oriented X4 Cognition Arousal/Alertness: Awake/alert Behavior During Therapy: WFL for tasks assessed/performed Overall Cognitive Status: Impaired/Different from baseline Area of Impairment: Following commands, Safety/judgement, Problem solving Following Commands: Follows one step commands consistently Safety/Judgement: Decreased awareness  of deficits Problem Solving: Requires verbal cues General Comments: pt with difficulty with reading clock, dropping LUE off armrest and only scanning to right for room numbers  Physical Exam: Blood pressure  134/80, pulse 70, temperature 98.9 F (37.2 C), temperature source Oral, resp. rate 15, weight 120.3 kg (265 lb 3.4 oz), SpO2 97 %. Physical Exam  Vitals reviewed. Constitutional: She appears well-nourished. No distress.  62 year old Hispanic obese female  Eyes: Right eye exhibits no discharge. Left eye exhibits no discharge.  Pupils reactive to light  Neck: Normal range of motion. Neck supple. No tracheal deviation present. No thyromegaly present.  Cardiovascular: Normal rate, regular rhythm and normal heart sounds. Exam reveals no gallop and no friction rub.  No murmur heard. Respiratory: Effort normal and breath sounds normal. No respiratory distress. She has no wheezes. She has no rales.  GI: Soft. Bowel sounds are normal. She exhibits no distension.  Musculoskeletal: She exhibits no edema or deformity.  Skin. Warm and dry Neurological. She is alert. Right gaze preference. Seems to follow commands in her language. Left central 7 and tongue deviation. +left inattention. LUE grossly 2-2+/5 prox to distal. LLE 3 to 3+/5 prox to distal with some difficulties activitating on command. Senses pain in left leg more than left arm. RUE and RLE grossly 4+ to 5/5 with normal sensory function. No resting tone.  Psych: pt pleasant and cooperative  Results for orders placed or performed during the hospital encounter of 12/17/17 (from the past 48 hour(s))  Protime-INR     Status: None   Collection Time: 12/17/17 10:55 PM  Result Value Ref Range   Prothrombin Time 11.9 11.4 - 15.2 seconds   INR 0.89     Comment: Performed at [facility], [place] [address] [place], [place] [address]  APTT     Status: None   Collection Time: 12/17/17 10:55 PM  Result Value Ref Range   aPTT 28 24 - 36  seconds    Comment: Performed at [facility] [address], [place], [place] [address]  CBC     Status: None   Collection Time: 12/17/17 10:55 PM  Result Value Ref Range   WBC 8.0 4.0 - 10.5 K/uL   RBC 4.34 3.87 - 5.11 MIL/uL   Hemoglobin 13.1 12.0 - 15.0 g/dL   HCT 40.9 36.0 - 46.0 %   MCV 94.2 78.0 - 100.0 fL   MCH 30.2 26.0 - 34.0 pg   MCHC 32.0 30.0 - 36.0 g/dL   RDW 13.1 11.5 - 15.5 %   Platelets 247 150 - 400 K/uL    Comment: Performed at [facility], [place] [address] [place], [place] [address]  Differential     Status: None   Collection Time: 12/17/17 10:55 PM  Result Value Ref Range   Neutrophils Relative % 54 %   Neutro Abs 4.3 1.7 - 7.7 K/uL   Lymphocytes Relative 38 %   Lymphs Abs 3.0 0.7 - 4.0 K/uL   Monocytes Relative 7 %   Monocytes Absolute 0.6 0.1 - 1.0 K/uL   Eosinophils Relative 1 %   Eosinophils Absolute 0.1 0.0 - 0.7 K/uL   Basophils Relative 0 %   Basophils Absolute 0.0 0.0 - 0.1 K/uL    Comment: Performed at [facility] [address] [place],  [address]  Comprehensive metabolic panel     Status: Abnormal   Collection Time: 12/17/17 10:55 PM  Result Value Ref Range   Sodium 138 135 - 145 mmol/L   Potassium 4.1 3.5 - 5.1 mmol/L   Chloride 105 101 - 111 mmol/L   CO2 25 22 - 32 mmol/L   Glucose,  Bld 204 (H) 65 - 99 mg/dL   BUN 13 6 - 20 mg/dL   Creatinine, Ser 0.57 0.44 - 1.00 mg/dL   Calcium 9.0 8.9 - 10.3 mg/dL   Total Protein 6.5 6.5 - 8.1 g/dL   Albumin 3.5 3.5 - 5.0 g/dL   AST 21 15 - 41 U/L   ALT 23 14 - 54 U/L   Alkaline Phosphatase 76 38 - 126 U/L   Total Bilirubin 0.3 0.3 - 1.2 mg/dL   GFR calc non Af Amer >60 >60 mL/min   GFR calc Af Amer >60 >60 mL/min    Comment: (NOTE) The eGFR has been calculated using the CKD EPI equation. This calculation has not been validated in all clinical situations. eGFR's persistently <60 mL/min signify possible Chronic Kidney Disease.    Anion gap 8 5 - 15    Comment:  Performed at [facility] [address] [place], [place] [address]  CBG monitoring, ED     Status: Abnormal   Collection Time: 12/17/17 10:56 PM  Result Value Ref Range   Glucose-Capillary 184 (H) 65 - 99 mg/dL  I-stat troponin, ED     Status: None   Collection Time: 12/17/17 10:58 PM  Result Value Ref Range   Troponin i, poc 0.00 0.00 - 0.08 ng/mL   Comment 3            Comment: Due to the release kinetics of cTnI, a negative result within the first hours of the onset of symptoms does not rule out myocardial infarction with certainty. If myocardial infarction is still suspected, repeat the test at appropriate intervals.   I-Stat Chem 8, ED     Status: Abnormal   Collection Time: 12/17/17 11:00 PM  Result Value Ref Range   Sodium 142 135 - 145 mmol/L   Potassium 4.1 3.5 - 5.1 mmol/L   Chloride 104 101 - 111 mmol/L   BUN 15 6 - 20 mg/dL   Creatinine, Ser 0.50 0.44 - 1.00 mg/dL   Glucose, Bld 202 (H) 65 - 99 mg/dL   Calcium, Ion 1.16 1.15 - 1.40 mmol/L   TCO2 28 22 - 32 mmol/L   Hemoglobin 14.3 12.0 - 15.0 g/dL   HCT 42.0 36.0 - 46.0 %  HIV antibody (Routine Testing)     Status: None   Collection Time: 12/18/17  1:13 AM  Result Value Ref Range   HIV Screen 4th Generation wRfx Non Reactive Non Reactive    Comment: (NOTE) Performed At: [facility] [address] [place], [place] [id number] Rush Farmer MD [id number] Performed at [facility], [place] [address], [place], [place] [address]   Hemoglobin A1c     Status: Abnormal   Collection Time: 12/18/17  4:05 AM  Result Value Ref Range   Hgb A1c MFr Bld 6.7 (H) 4.8 - 5.6 %    Comment: (NOTE) Pre diabetes:          5.7%-6.4% Diabetes:              >6.4% Glycemic control for   <7.0% adults with diabetes    Mean Plasma Glucose 145.59 mg/dL    Comment: Performed at [facility] [address], [place], [place] [address]  Lipid panel     Status: Abnormal   Collection Time: 12/18/17  4:05 AM   Result Value Ref Range   Cholesterol 115 0 - 200 mg/dL   Triglycerides 55 <150 mg/dL   HDL 37 (L) >  40 mg/dL   Total CHOL/HDL Ratio 3.1 RATIO   VLDL 11 0 - 40 mg/dL   LDL Cholesterol 67 0 - 99 mg/dL    Comment:        Total Cholesterol/HDL:CHD Risk Coronary Heart Disease Risk Table                     Men   Women  1/2 Average Risk   3.4   3.3  Average Risk       5.0   4.4  2 X Average Risk   9.6   7.1  3 X Average Risk  23.4   11.0        Use the calculated Patient Ratio above and the CHD Risk Table to determine the patient's CHD Risk.        ATP III CLASSIFICATION (LDL):  <100     mg/dL   Optimal  100-129  mg/dL   Near or Above                    Optimal  130-159  mg/dL   Borderline  160-189  mg/dL   High  >190     mg/dL   Very High Performed at [facility] [address], [place], [place] [address]   CBC with Differential/Platelet     Status: None   Collection Time: 12/18/17  4:05 AM  Result Value Ref Range   WBC 9.8 4.0 - 10.5 K/uL   RBC 3.96 3.87 - 5.11 MIL/uL   Hemoglobin 12.3 12.0 - 15.0 g/dL   HCT 37.6 36.0 - 46.0 %   MCV 94.9 78.0 - 100.0 fL   MCH 31.1 26.0 - 34.0 pg   MCHC 32.7 30.0 - 36.0 g/dL   RDW 13.3 11.5 - 15.5 %   Platelets 226 150 - 400 K/uL   Neutrophils Relative % 58 %   Neutro Abs 5.7 1.7 - 7.7 K/uL   Lymphocytes Relative 37 %   Lymphs Abs 3.6 0.7 - 4.0 K/uL   Monocytes Relative 4 %   Monocytes Absolute 0.4 0.1 - 1.0 K/uL   Eosinophils Relative 1 %   Eosinophils Absolute 0.1 0.0 - 0.7 K/uL   Basophils Relative 0 %   Basophils Absolute 0.0 0.0 - 0.1 K/uL    Comment: Performed at [facility], [place] [address], [place], [place] [address]  Basic metabolic panel     Status: Abnormal   Collection Time: 12/18/17  4:05 AM  Result Value Ref Range   Sodium 139 135 - 145 mmol/L   Potassium 4.4 3.5 - 5.1 mmol/L   Chloride 110 101 - 111 mmol/L   CO2 21 (L) 22 - 32 mmol/L   Glucose, Bld 214 (H) 65 - 99 mg/dL   BUN 10 6 - 20 mg/dL    Creatinine, Ser 0.54 0.44 - 1.00 mg/dL   Calcium 7.7 (L) 8.9 - 10.3 mg/dL   GFR calc non Af Amer >60 >60 mL/min   GFR calc Af Amer >60 >60 mL/min    Comment: (NOTE) The eGFR has been calculated using the CKD EPI equation. This calculation has not been validated in all clinical situations. eGFR's persistently <60 mL/min signify possible Chronic Kidney Disease.    Anion gap 8 5 - 15    Comment: Performed at [facility] [address], [place], [place] [address]  MRSA PCR Screening     Status: None   Collection Time:  12/18/17  5:28 AM  Result Value Ref Range   MRSA by PCR NEGATIVE NEGATIVE    Comment:        The GeneXpert MRSA Assay (FDA approved for NASAL specimens only), is one component of a comprehensive MRSA colonization surveillance program. It is not intended to diagnose MRSA infection nor to guide or monitor treatment for MRSA infections. Performed at [facility], [place] [address], [place], [place] [address]   Basic metabolic panel     Status: Abnormal   Collection Time: 12/19/17  5:13 AM  Result Value Ref Range   Sodium 138 135 - 145 mmol/L   Potassium 3.5 3.5 - 5.1 mmol/L   Chloride 104 101 - 111 mmol/L   CO2 25 22 - 32 mmol/L   Glucose, Bld 184 (H) 65 - 99 mg/dL   BUN <5 (L) 6 - 20 mg/dL   Creatinine, Ser 0.39 (L) 0.44 - 1.00 mg/dL   Calcium 8.6 (L) 8.9 - 10.3 mg/dL   GFR calc non Af Amer >60 >60 mL/min   GFR calc Af Amer >60 >60 mL/min    Comment: (NOTE) The eGFR has been calculated using the CKD EPI equation. This calculation has not been validated in all clinical situations. eGFR's persistently <60 mL/min signify possible Chronic Kidney Disease.    Anion gap 9 5 - 15    Comment: Performed at [facility] [address] [place], [place] [address]  CBC     Status: Abnormal   Collection Time: 12/19/17  5:13 AM  Result Value Ref Range   WBC 10.9 (H) 4.0 - 10.5 K/uL   RBC 4.01 3.87 - 5.11 MIL/uL   Hemoglobin 12.3 12.0 - 15.0 g/dL   HCT  37.8 36.0 - 46.0 %   MCV 94.3 78.0 - 100.0 fL   MCH 30.7 26.0 - 34.0 pg   MCHC 32.5 30.0 - 36.0 g/dL   RDW 13.2 11.5 - 15.5 %   Platelets 237 150 - 400 K/uL    Comment: Performed at [facility], [place] [address], [place], [place] [address]   Ct Angio Head W Or Wo Contrast  Result Date: 12/18/2017 CLINICAL DATA:  Initial evaluation for acute left-sided deficits. EXAM: CT ANGIOGRAPHY HEAD AND NECK TECHNIQUE: Multidetector CT imaging of the head and neck was performed using the standard protocol during bolus administration of intravenous contrast. Multiplanar CT image reconstructions and MIPs were obtained to evaluate the vascular anatomy. Carotid stenosis measurements (when applicable) are obtained utilizing NASCET criteria, using the distal internal carotid diameter as the denominator. CONTRAST:  50 cc of Isovue 370. COMPARISON:  Prior CT from earlier the same day. FINDINGS: CTA NECK FINDINGS Aortic arch: Visualized aortic arch of normal caliber with normal branch pattern. No high-grade stenosis about the origin of the great vessels. Visualized subclavian arteries widely patent. Right carotid system: Right common carotid artery widely patent to the bifurcation without stenosis. Right ICA widely patent from the bifurcation to the skull base without stenosis, dissection, or occlusion. Medial station of the right carotid artery system into the retropharyngeal space noted. No significant atheromatous narrowing about the right carotid bifurcation. Left carotid system: Left common and internal carotid arteries are widely patent without stenosis, dissection, or occlusion. No significant atheromatous narrowing about the left carotid bifurcation. Vertebral arteries: Both of the vertebral arteries arise from the subclavian arteries. Left vertebral artery dominant with a diffusely hypoplastic right vertebral artery. Vertebral arteries widely patent within the neck without stenosis,  dissection, or occlusion.  Skeleton: No acute osseus abnormality. No worrisome lytic or blastic osseous lesions. Other neck: No acute soft tissue abnormality within the neck. Salivary glands normal. Thyroid within normal limits. No adenopathy. Upper chest: Visualized upper chest within normal limits. Visualized upper lungs are grossly clear. Few tiny tree-in-bud nodular densities noted at the medial left upper lobe, favored to reflect mucoid impaction, of doubtful significance. Review of the MIP images confirms the above findings CTA HEAD FINDINGS Anterior circulation: Petrous, cavernous, and supraclinoid segments of the internal carotid arteries are widely patent without flow-limiting stenosis. ICA termini widely patent. Dominant right A1 segment widely patent. Left A1 segment diffusely hypoplastic, accounting for the slightly diminutive left ICA is compared to the right. Widely patent and normal anterior communicating artery. Anterior cerebral arteries well perfused to their distal aspects without stenosis. Focal irregularity with mild to moderate stenosis noted at the mid right M1 segment (series 8, image 101). Right M1 patent distally. There is abrupt near occlusion of a proximal right M2 branch, middle division (series 11, image 20). Right M2 immediately branches just distally, with markedly thready and irregular flow within the M3 and M4 segments distally (series 8, image 90). Finding suspicious for possible subocclusive thrombus. A superimposed high-grade stenosis may be contributory as well. Overall, there is decreased perfusion with poor collateralization within the distal right MCA territory as compared to the left (series 11, image 14). Left M1 segment widely patent without stenosis or occlusion. Normal left MCA bifurcation. No proximal left M2 occlusion. Distal left MCA branches well perfused. Mild distal small vessel atheromatous irregularity. Posterior circulation: Vertebral arteries patent to the vertebrobasilar junction  without stenosis. Left vertebral artery dominant. Patent left PICA. Right PICA not well visualized. Basilar artery widely patent to its distal aspect. Superior cerebral arteries patent bilaterally. Both of the posterior cerebral arteries primarily supplied via the basilar. Short-segment mild to moderate proximal left P1 stenosis. Mild atheromatous irregularity within the bilateral P2 segments and distal PCAs without high-grade stenosis. Bilateral posterior communicating arteries noted, right larger than left. Venous sinuses: Patent. Anatomic variants: Hypoplastic left A1 segment. Dominant left vertebral artery. No intracranial aneurysm. Delayed phase: Not performed. Review of the MIP images confirms the above findings IMPRESSION: 1. 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 Finding suspicious for near occlusive/subocclusive thrombus. Underlying stenosis may be contributory as well. 2. Irregular approximate 50% moderate mid right M1 stenosis. 3. Mild distal small vessel atheromatous irregularity. Critical Value/emergent results were called by telephone at the time of interpretation on 12/17/2017 at 11:35 pm to Dr. Samara Snide , who verbally acknowledged these results. Electronically Signed   By: Jeannine Boga M.D.   On: 12/18/2017 00:07   Ct Head Wo Contrast  Result Date: 12/18/2017 CLINICAL DATA:  62 year old female code stroke status post endovascular treatment earlier today. EXAM: CT HEAD WITHOUT CONTRAST TECHNIQUE: Contiguous axial images were obtained from the base of the skull through the vertex without intravenous contrast. COMPARISON:  CTA head and neck and noncontrast head CT 12/17/2016. Cerebral angiogram 0400 hours today. FINDINGS: Brain: Cytotoxic edema has developed in the right insula and operculum in an area of about 3 centimeters (series 3, images 14-16). There is mild associated mass effect on the right lateral ventricle. No  midline shift. No acute intracranial hemorrhage identified. No ventriculomegaly. Stable and normal gray-white matter differentiation in the right basal ganglia. Stable gray-white matter differentiation elsewhere in the right MCA territory. The left hemisphere and posterior fossa  remain normal. Partially empty sella re- demonstrated. Vascular: Vascular stent present from the right M1 segment into the posterior right MCA division. Skull: Stable, negative. Sinuses/Orbits: Stable. Mild bubbly opacity in the right sphenoid sinus. Other: There is no longer rightward gaze deviation. Visualized orbits and scalp soft tissues are within normal limits. IMPRESSION: 1. Right MCA territory infarct now apparent affecting the insula and operculum. Cytotoxic edema affecting an area of about 3 cm. No associated hemorrhage. 2. Mild regional mass effect with no midline shift. 3. Right MCA vascular stent in place. Electronically Signed   By: Genevie Ann M.D.   On: 12/18/2017 14:44   Ct Angio Neck W Or Wo Contrast  Result Date: 12/18/2017 CLINICAL DATA:  Initial evaluation for acute left-sided deficits. EXAM: CT ANGIOGRAPHY HEAD AND NECK TECHNIQUE: Multidetector CT imaging of the head and neck was performed using the standard protocol during bolus administration of intravenous contrast. Multiplanar CT image reconstructions and MIPs were obtained to evaluate the vascular anatomy. Carotid stenosis measurements (when applicable) are obtained utilizing NASCET criteria, using the distal internal carotid diameter as the denominator. CONTRAST:  50 cc of Isovue 370. COMPARISON:  Prior CT from earlier the same day. FINDINGS: CTA NECK FINDINGS Aortic arch: Visualized aortic arch of normal caliber with normal branch pattern. No high-grade stenosis about the origin of the great vessels. Visualized subclavian arteries widely patent. Right carotid system: Right common carotid artery widely patent to the bifurcation without stenosis. Right ICA widely  patent from the bifurcation to the skull base without stenosis, dissection, or occlusion. Medial station of the right carotid artery system into the retropharyngeal space noted. No significant atheromatous narrowing about the right carotid bifurcation. Left carotid system: Left common and internal carotid arteries are widely patent without stenosis, dissection, or occlusion. No significant atheromatous narrowing about the left carotid bifurcation. Vertebral arteries: Both of the vertebral arteries arise from the subclavian arteries. Left vertebral artery dominant with a diffusely hypoplastic right vertebral artery. Vertebral arteries widely patent within the neck without stenosis, dissection, or occlusion. Skeleton: No acute osseus abnormality. No worrisome lytic or blastic osseous lesions. Other neck: No acute soft tissue abnormality within the neck. Salivary glands normal. Thyroid within normal limits. No adenopathy. Upper chest: Visualized upper chest within normal limits. Visualized upper lungs are grossly clear. Few tiny tree-in-bud nodular densities noted at the medial left upper lobe, favored to reflect mucoid impaction, of doubtful significance. Review of the MIP images confirms the above findings CTA HEAD FINDINGS Anterior circulation: Petrous, cavernous, and supraclinoid segments of the internal carotid arteries are widely patent without flow-limiting stenosis. ICA termini widely patent. Dominant right A1 segment widely patent. Left A1 segment diffusely hypoplastic, accounting for the slightly diminutive left ICA is compared to the right. Widely patent and normal anterior communicating artery. Anterior cerebral arteries well perfused to their distal aspects without stenosis. Focal irregularity with mild to moderate stenosis noted at the mid right M1 segment (series 8, image 101). Right M1 patent distally. There is abrupt near occlusion of a proximal right M2 branch, middle division (series 11, image 20).  Right M2 immediately branches just distally, with markedly thready and irregular flow within the M3 and M4 segments distally (series 8, image 90). Finding suspicious for possible subocclusive thrombus. A superimposed high-grade stenosis may be contributory as well. Overall, there is decreased perfusion with poor collateralization within the distal right MCA territory as compared to the left (series 11, image 14). Left M1 segment widely patent without stenosis or occlusion.  Normal left MCA bifurcation. No proximal left M2 occlusion. Distal left MCA branches well perfused. Mild distal small vessel atheromatous irregularity. Posterior circulation: Vertebral arteries patent to the vertebrobasilar junction without stenosis. Left vertebral artery dominant. Patent left PICA. Right PICA not well visualized. Basilar artery widely patent to its distal aspect. Superior cerebral arteries patent bilaterally. Both of the posterior cerebral arteries primarily supplied via the basilar. Short-segment mild to moderate proximal left P1 stenosis. Mild atheromatous irregularity within the bilateral P2 segments and distal PCAs without high-grade stenosis. Bilateral posterior communicating arteries noted, right larger than left. Venous sinuses: Patent. Anatomic variants: Hypoplastic left A1 segment. Dominant left vertebral artery. No intracranial aneurysm. Delayed phase: Not performed. Review of the MIP images confirms the above findings IMPRESSION: 1. 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 Finding suspicious for near occlusive/subocclusive thrombus. Underlying stenosis may be contributory as well. 2. Irregular approximate 50% moderate mid right M1 stenosis. 3. Mild distal small vessel atheromatous irregularity. Critical Value/emergent results were called by telephone at the time of interpretation on 12/17/2017 at 11:35 pm to Dr. Samara Snide , who verbally acknowledged these  results. Electronically Signed   By: Jeannine Boga M.D.   On: 12/18/2017 00:07   Dg Chest Port 1 View  Result Date: 12/18/2017 CLINICAL DATA:  Status post acute CVA, current smoker. EXAM: PORTABLE CHEST 1 VIEW COMPARISON:  Chest x-ray of September 12, 2016 FINDINGS: The lungs are well-expanded and clear. The interstitial markings are mildly prominent though stable. The heart is top-normal in size. The pulmonary vascularity is normal. The mediastinum is normal in width. IMPRESSION: There is no acute cardiopulmonary abnormality. No evidence of aspiration pneumonia. Electronically Signed   By: David  Martinique M.D.   On: 12/18/2017 08:36   Ct Head Code Stroke Wo Contrast  Result Date: 12/17/2017 CLINICAL DATA:  Code stroke. Initial evaluation for acute left-sided deficits. EXAM: CT HEAD WITHOUT CONTRAST TECHNIQUE: Contiguous axial images were obtained from the base of the skull through the vertex without intravenous contrast. COMPARISON:  Prior CT from 09/01/2013. FINDINGS: Brain: Cerebral volume within normal limits for age. No acute intracranial hemorrhage. No acute large vessel territory infarct. Tiny remote right basal ganglia lacunar infarct noted. No mass lesion, midline shift or mass effect. No hydrocephalus. No extra-axial fluid collection. Vascular: No hyperdense vessel. Scattered vascular calcifications noted within the carotid siphons. Skull: Scalp soft tissues and calvarium demonstrate no acute abnormality. Sinuses/Orbits: Globes and orbital soft tissues within normal limits. Partial gas fixation of the lateral recess of the right sphenoid sinus noted. Paranasal sinuses are otherwise clear. No mastoid effusion. Other: None. ASPECTS [facility] Stroke Program Early CT Score) - Ganglionic level infarction (caudate, lentiform nuclei, internal capsule, insula, M1-M3 cortex): 7 - Supraganglionic infarction (M4-M6 cortex): 3 Total score (0-10 with 10 being normal): 10 IMPRESSION: 1. No acute intracranial  infarct or other process identified. 2. ASPECTS is 10. 3. Tiny remote right basal ganglia lacunar infarct. These results were communicated to [place] at 11:11 pmon 3/17/2019by text page via the [facility] messaging system. Electronically Signed   By: Jeannine Boga M.D.   On: 12/17/2017 23:14       Medical Problem List and Plan: 1.  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  DVT Prophylaxis/Anticoagulation: Brilinta 3. Pain Management:  Tylenol 3 for headaches as needed 4. Mood: Provide emotional support 5. Neuropsych: This patient is capable of making decisions on her own  behalf. 6. Skin/Wound Care:  Routine skin checks 7. Fluids/Electrolytes/Nutrition:  Routine I&O's with follow-up chemistries 8.Dysphagia. Dysphagia #1 nectar liquids. Follow-up speech therapy 9. Tobacco abuse. Provide counseling 10. Hypertension. Permissive hypertension. Monitor with increased mobility   Post Admission Physician Evaluation: 1. Functional deficits secondary  to right MCA infarct. 2. Patient is admitted to receive collaborative, interdisciplinary care between the physiatrist, rehab nursing staff, and therapy team. 3. Patient's level of medical complexity and substantial therapy needs in context of that medical necessity cannot be provided at a lesser intensity of care such as a SNF. 4. Patient has experienced substantial functional loss from his/her baseline which was documented above under the "Functional History" and "Functional Status" headings.  Judging by the patient's diagnosis, physical exam, and functional history, the patient has potential for functional progress which will result in measurable gains while on inpatient rehab.  These gains will be of substantial and practical use upon discharge  in facilitating mobility and self-care at the household level. 5. Physiatrist will provide 24 hour management of medical needs as well as  oversight of the therapy plan/treatment and provide guidance as appropriate regarding the interaction of the two. 6. The Preadmission Screening has been reviewed and patient status is unchanged unless otherwise stated above. 7. 24 hour rehab nursing will assist with bladder management, bowel management, safety, skin/wound care, disease management, medication administration, pain management and patient education  and help integrate therapy concepts, techniques,education, etc. 8. PT will assess and treat for/with: Lower extremity strength, range of motion, stamina, balance, functional mobility, safety, adaptive techniques and equipment, NMR, visual-perceptual awareness, family education .   Goals are: supervision to mod I. 9. OT will assess and treat for/with: ADL's, functional mobility, safety, upper extremity strength, adaptive techniques and equipment, NMR, visual-perceptual awarenes.   Goals are: supervision to mod I. Therapy may proceed with showering this patient. 10. SLP will assess and treat for/with: cognition, communication.  Goals are: supervision to mod I. 11. Case Management and Social Worker will assess and treat for psychological issues and discharge planning. 12. Team conference will be held weekly to assess progress toward goals and to determine barriers to discharge. 13. Patient will receive at least 3 hours of therapy per day at least 5 days per week. 14. ELOS: 7-10 days       15. Prognosis:  excellent     Meredith Staggers, MD, [facility] 12/19/2017  Lavon Paganini [place], PA-C 12/19/2017

## 2017-12-19 NOTE — Progress Notes (Signed)
  Echocardiogram 2D Echocardiogram has been performed.  Landy Dunnavant L Androw 12/19/2017, 2:33 PM

## 2017-12-19 NOTE — H&P (Signed)
Physical Medicine and Rehabilitation Admission H&P     Chief Complaint  Patient presents with  . Code Stroke  :  HPI: Heather [name] is a 62 y.o.right handed Spanish-speaking female with history of hypertension tobacco abuse. On no scheduled medications.Patient lives with grandaughter.Independent prior to admission. Grandaughter works during the day. 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 No associated hemorrhage. Mild regional mass effect with no midline shift.Patient did not receive TPA. CT angiogram head and neck showed abrupt proximal right M2 occlusion/ near occlusion.Underwent mechanical thrombectomy per interventional radiology.Echocardiogram is pending.MRI pending. Currently maintained on aspirin as well as Brilinta for CVA prophylaxis. Close monitoring of blood pressure.Dysphagia #1 nectar thick liquid diet. Physical therapy evaluations completed M.D. Has requested physical medicine rehabilitation consult.Patient was admitted for comprehensive rehabilitation program  Review of Systems  Unable to perform ROS: Language       Past Medical History:  Diagnosis Date  . Hypertension         Past Surgical History:  Procedure Laterality Date  . BREAST EXCISIONAL BIOPSY    . BREAST SURGERY     right breast  . CHOLECYSTECTOMY          Family History  Problem Relation Age of Onset  . Diabetes Mother   . Pulmonary disease Mother   . Heart disease Father    Social History: reports that she has been smoking cigarettes. She has a 15.00 pack-year smoking history. she has never used smokeless tobacco. She reports that she drinks alcohol. She reports that she does not use drugs.  Allergies: No Known Allergies  No medications prior to admission.   Drug Regimen Review  Drug regimen Was reviewed and remains appropriate with no significant issues identified  Home:  Home Living    Family/patient expects to be discharged to:: Private residence  Living Arrangements: Other relatives(granddaughter)  Available Help at Discharge: Family, Available 24 hours/day  Type of Home: Apartment  Home Access: Stairs to enter  [employer] of Steps: 12  Home Layout: One level  Bathroom Shower/Tub: Medical sales representative: Standard  Home Equipment: None  Functional History:  Prior Function  Level of Independence: Independent  Comments: babysits 5 kids and likes to drink beer  Functional Status:  Mobility:  Bed Mobility  Overal bed mobility: Needs Assistance  Bed Mobility: Supine to Sit  Supine to sit: Min assist  General bed mobility comments: assist for lines with pt using momentum to elevate trunk with RUE assist to fully raise trunk from surface and increased time, cues to scoot to EOB  Transfers  Overall transfer level: Needs assistance  Transfers: Sit to/from Stand  Sit to Stand: Min guard  General transfer comment: guarding for balance to stand, decreased contol of descent with close guarding to sit to prevent fall  Ambulation/Gait  Ambulation/Gait assistance: Min guard  Ambulation Distance (Feet): 100 Feet  Assistive device: None  Gait Pattern/deviations: Step-through pattern, Decreased stride length  General Gait Details: pt with decreased speed and stability with gait with ability to walk short distance slowly and cautiously without UE assist, cues for direction and room  Gait velocity interpretation: Below normal speed for age/gender   ADL:   Cognition:  Cognition  Overall Cognitive Status: Impaired/Different from baseline  Orientation Level: Oriented X4  Cognition  Arousal/Alertness: Awake/alert  Behavior During Therapy: WFL for tasks assessed/performed  Overall Cognitive Status: Impaired/Different from baseline  Area of Impairment: Following commands,  Safety/judgement, Problem solving  Following Commands: Follows one step commands  consistently  Safety/Judgement: Decreased awareness of deficits  Problem Solving: Requires verbal cues  General Comments: pt with difficulty with reading clock, dropping LUE off armrest and only scanning to right for room numbers  Physical Exam:  Blood pressure 134/80, pulse 70, temperature 98.9 F (37.2 C), temperature source Oral, resp. rate 15, weight 120.3 kg (265 lb 3.4 oz), SpO2 97 %.  Physical Exam  Vitals reviewed.  Constitutional: She appears well-nourished. No distress.  62 year old Hispanic obese female  Eyes: Right eye exhibits no discharge. Left eye exhibits no discharge.  Pupils reactive to light  Neck: Normal range of motion. Neck supple. No tracheal deviation present. No thyromegaly present.  Cardiovascular: Normal rate, regular rhythm and normal heart sounds. Exam reveals no gallop and no friction rub.  No murmur heard.  Respiratory: Effort normal and breath sounds normal. No respiratory distress. She has no wheezes. She has no rales.  GI: Soft. Bowel sounds are normal. She exhibits no distension.  Musculoskeletal: She exhibits no edema or deformity.  Skin. Warm and dry  Neurological. She is alert. Right gaze preference. Seems to follow commands in her language. Left central 7 and tongue deviation. +left inattention. LUE grossly 2-2+/5 prox to distal. LLE 3 to 3+/5 prox to distal with some difficulties activitating on command. Senses pain in left leg more than left arm. RUE and RLE grossly 4+ to 5/5 with normal sensory function. No resting tone.  Psych: pt pleasant and cooperative  Lab Results Last 48 Hours  Imaging Results (Last 48 hours)     Medical Problem List and Plan:  1. 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 DVT Prophylaxis/Anticoagulation: Brilinta  3. Pain Management: Tylenol 3 for headaches as needed  4. Mood: Provide emotional support  5. Neuropsych: This patient is capable of making decisions on her own behalf.  6. Skin/Wound Care: Routine skin checks  7. Fluids/Electrolytes/Nutrition: Routine I&O's with follow-up chemistries  8.Dysphagia. Dysphagia #1 nectar liquids. Follow-up speech therapy  9. Tobacco abuse. Provide counseling  10. Hypertension. Permissive hypertension. Monitor with increased mobility  Post Admission Physician Evaluation:  1. Functional deficits secondary to right MCA infarct. 2. Patient is admitted to receive collaborative, interdisciplinary care between the physiatrist, rehab nursing staff, and therapy team. 3. Patient's level of medical complexity and substantial therapy needs in context of that medical necessity cannot be provided at a lesser intensity of care such as a SNF. 4. Patient has experienced substantial  functional loss from his/her baseline which was documented above under the "Functional History" and "Functional Status" headings. Judging by the patient's diagnosis, physical exam, and functional history, the patient has potential for functional progress which will result in measurable gains while on inpatient rehab. These gains will be of substantial and practical use upon discharge in facilitating mobility and self-care at the household level. 5. Physiatrist will provide 24 hour management of medical needs as well as oversight of the therapy plan/treatment and provide guidance as appropriate regarding the interaction of the two. 6. The Preadmission Screening has been reviewed and patient status is unchanged unless otherwise stated above. 7. 24 hour rehab nursing will assist with bladder management, bowel management, safety, skin/wound care, disease management, medication administration, pain management and patient education and help integrate therapy concepts, techniques,education, etc. 8. PT will assess and treat for/with: Lower extremity strength, range of motion, stamina, balance, functional mobility, safety, adaptive techniques and equipment, NMR, visual-perceptual awareness, family education . Goals are: supervision to mod I. 9. OT will assess and treat for/with: ADL's, functional mobility, safety, upper extremity strength, adaptive techniques and equipment, NMR, visual-perceptual awarenes. Goals are: supervision to mod I. Therapy may proceed with showering this patient. 10. SLP will assess and treat for/with: cognition, communication, swallowing. Goals are: supervision to mod I. 11. Case Management and Social Worker will assess and treat for psychological issues and discharge planning. 12. Team conference will be held weekly to assess progress toward goals and to determine barriers to discharge. 13. Patient will receive at least 3 hours of therapy per day at least 5 days per week. 14. ELOS: 7-10  days  15. Prognosis: excellent Ranelle Oyster, MD, [facility]  [facility]  12/19/2017  Mcarthur Rossetti Angiulli, PA-C  12/19/2017

## 2017-12-19 NOTE — Progress Notes (Signed)
Rehab admissions - I met with patient's daughter at the bedside.  Patient was asleep.  Daughter would like inpatient rehab admission prior to home with grand daughter.  Patient can admit to acute inpatient rehab this afternoon after the MRI is completed.  Call me for questions.  #[phone]

## 2017-12-19 NOTE — Discharge Summary (Addendum)
Stroke Discharge Summary  Patient ID: Heather [name]   MRN: [id number]      DOB: [birth year]  Date of Admission: 12/17/2017 Date of Discharge: 12/19/2017  Attending Physician:  Micki Riley, MD, Stroke MD Consultant(s):   rehabilitation medicine  Dr Riley Kill Patient's PCP:  Lizbeth Bark, FNP  Discharge Diagnoses: Right MCA infarct Active Problems:   Acute right arterial ischemic stroke, middle cerebral artery (MCA) (HCC) from right MCA stenosis with occlusion treated with mechanical thrombectomy followed by rescue intracranial stenting   Middle cerebral artery embolism, right  Past Medical History:  Diagnosis Date  . Hypertension    Past Surgical History:  Procedure Laterality Date  . BREAST EXCISIONAL BIOPSY    . BREAST SURGERY     right breast  . CHOLECYSTECTOMY      Medications to be continued on Rehab . aspirin  81 mg Oral Daily   Or  . aspirin  81 mg Per Tube Daily  . [START ON 12/20/2017] Influenza vac split quadrivalent PF  0.5 mL Intramuscular Tomorrow-1000  . ticagrelor  90 mg Oral BID   Or  . ticagrelor  90 mg Per Tube BID    LABORATORY STUDIES CBC    Component Value Date/Time   WBC 10.9 (H) 12/19/2017 0513   RBC 4.01 12/19/2017 0513   HGB 12.3 12/19/2017 0513   HGB 14.9 07/06/2017 1550   HCT 37.8 12/19/2017 0513   HCT 43.7 07/06/2017 1550   PLT 237 12/19/2017 0513   PLT 295 07/06/2017 1550   MCV 94.3 12/19/2017 0513   MCV 89 07/06/2017 1550   MCH 30.7 12/19/2017 0513   MCHC 32.5 12/19/2017 0513   RDW 13.2 12/19/2017 0513   RDW 13.3 07/06/2017 1550   LYMPHSABS 3.6 12/18/2017 0405   MONOABS 0.4 12/18/2017 0405   EOSABS 0.1 12/18/2017 0405   BASOSABS 0.0 12/18/2017 0405   CMP    Component Value Date/Time   NA 138 12/19/2017 0513   K 3.5 12/19/2017 0513   CL 104 12/19/2017 0513   CO2 25 12/19/2017 0513   GLUCOSE 184 (H) 12/19/2017 0513   BUN <5 (L) 12/19/2017 0513   CREATININE 0.39 (L) 12/19/2017 0513   CREATININE 0.53 09/05/2016  1017   CALCIUM 8.6 (L) 12/19/2017 0513   PROT 6.5 12/17/2017 2255   ALBUMIN 3.5 12/17/2017 2255   AST 21 12/17/2017 2255   ALT 23 12/17/2017 2255   ALKPHOS 76 12/17/2017 2255   BILITOT 0.3 12/17/2017 2255   GFRNONAA >60 12/19/2017 0513   GFRAA >60 12/19/2017 0513   COAGS Lab Results  Component Value Date   INR 0.89 12/17/2017   Lipid Panel    Component Value Date/Time   CHOL 115 12/18/2017 0405   TRIG 55 12/18/2017 0405   HDL 37 (L) 12/18/2017 0405   CHOLHDL 3.1 12/18/2017 0405   VLDL 11 12/18/2017 0405   LDLCALC 67 12/18/2017 0405   HgbA1C  Lab Results  Component Value Date   HGBA1C 6.7 (H) 12/18/2017   Urinalysis    Component Value Date/Time   COLORURINE YELLOW 10/18/2017 2158   APPEARANCEUR CLEAR 10/18/2017 2158   LABSPEC 1.012 10/18/2017 2158   PHURINE 6.0 10/18/2017 2158   GLUCOSEU NEGATIVE 10/18/2017 2158   HGBUR NEGATIVE 10/18/2017 2158   BILIRUBINUR NEGATIVE 10/18/2017 2158   KETONESUR 5 (A) 10/18/2017 2158   PROTEINUR NEGATIVE 10/18/2017 2158   NITRITE NEGATIVE 10/18/2017 2158   LEUKOCYTESUR NEGATIVE 10/18/2017 2158   Urine Drug Screen No  results found for: LABOPIA, COCAINSCRNUR, LABBENZ, AMPHETMU, THCU, LABBARB  Alcohol Level No results found for: [facility]   SIGNIFICANT DIAGNOSTIC STUDIES  Ct Angio Head W Or Wo Contrast Ct Angio Neck W Or Wo Contrast 12/18/2017 IMPRESSION:  1. 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 Finding suspicious for near occlusive/subocclusive thrombus. Underlying stenosis may be contributory as well.  2. Irregular approximate 50% moderate mid right M1 stenosis.  3. Mild distal small vessel atheromatous irregularity.    Ct Head Code Stroke Wo Contrast 12/17/2017 IMPRESSION:  1. No acute intracranial infarct or other process identified.  2. ASPECTS is 10.  3. Tiny remote right basal ganglia lacunar infarct.    MRI Brain Wo Contrast - pending      Rt  MCA.and ICAD stenosis of mid M1 - Dr Corliss Skains 12/18/2017 S/P rt common carotid arteriogram followed by complete revascularization of occluded the dominant inf division Achieving a TICI 3 reperfusion , followed by rescue stenting of progressive occlusion of inf division due underlying stenosis  Caused by athersclerotic plaque with a TICI 3 reperfusion. Meds. 12 mg od superselective intracranial intraarterial Integrelin ,and loading dose of 180 mg of brilinta po and 81 mg of aspirin po.    Transthoracic Echocardiogram  12/19/2017   pending      HISTORY OF PRESENT ILLNESS Heather [name] is an 62 y.o. female Hispanic with PMH of HTN,Obesity who presents [facility] emergency room as a stroke alert with left hemiparesis. Patient is Spanish-speaking and initially EMS was told that her symptoms began at 9 PM after she dropped a glass in her left hand. CT head showed no hemorrhage and tPA was mixed. However after nurse spoke to her in spanish, patient stated that she had a headache that began around 6:30pm and had left side weakness as she was outside window period upon completion of CT scan.   CTA head and neck was performed which showed a superior division M2 cutoff. After discussing with family given her borderline NIHSS of 7, however worsening symptoms ( from EMS assessment to arrival at hospital)  we decided to take the patient for mechanical thrombectomy.  Date last known well:3.17.19 Time last known well: around  6.30 pm tPA Given: no, outside window at time of CT scan NIHSS: 7 ( at time of arrival)  Baseline MRS 0    HOSPITAL COURSE Heather [name] is a 62 y.o. female with history of hypertension, ongoing tobacco use and obesity presenting with left sided weakness. She did not receive IV t-PA due to late presentation. She was taken to the interventional radiology suite for thrombectomy and right middle cerebral artery stent placement performed by Dr. Corliss Skains.  Stroke: Right MCA  territory infarct secondary to right middle cerebral artery stenosis status post rescue right MCA stenting  Resultant  Right gaze preference and left hemiplegia  CT head - No acute intracranial infarct. Tiny remote right basal ganglia lacunar infarct.   MRI head - pending  MRA head - not performed  CTA H&N - Abrupt proximal right M2 occlusion/near occlusion.  Carotid Doppler - CTA neck  2D Echo - pending  LDL - 67  HgbA1c - 6.7  VTE prophylaxis - SCDs  Fall precautions  Diet NPO time specified  aspirin 81 mg daily prior to admission, now on aspirin 81 mg daily and Brilinta 90 mg BID  Patient counseled to be compliant with her antithrombotic medications  Ongoing aggressive stroke risk factor management  Therapy recommendations:  Inpatient rehabilitation  Disposition:  Pending  Hypertension  Blood pressure tends to run low              Permissive hypertension (OK if < 220/120) but gradually normalize in 5-7 days              Long-term BP goal normotensive  Hyperlipidemia  Lipid lowering medication PTA:  none  LDL 67, goal < 70  Consider low-dose Lipitor when taking POs - (caution with Brilinta)  Continue statin at discharge   Other Stroke Risk Factors  Advanced age  Cigarette smoker - advised to stop smoking  ETOH use, advised to drink no more than 1 drink per day.  Obesity, Body mass index is 40.33 kg/m., recommend weight loss, diet and exercise as appropriate   Hx stroke (by imaging)   Other Active Problems  Headache - Morphine per Dr Juliane Lack Spanish - no English   Plan / Recommendations   Stroke workup - await echo and MRI  Inpatient rehabilitation -bed available today 12/19/2017.  Discussed with Dr. Pearlean Brownie.  Okay to proceed with rehabilitation center admission.   DISCHARGE EXAM Blood pressure 135/78, pulse 68, temperature 98.9 F (37.2 C), temperature source Oral, resp. rate 15, weight 265 lb 3.4 oz (120.3 kg), SpO2  96 %. Pleasant middle-age lady not in distress. . Afebrile. Head is nontraumatic. Neck is supple without bruit.    Cardiac exam no murmur or gallop. Lungs are clear to auscultation. Distal pulses are well felt.  Neurological exam Drowsy but awakens easily. Right gaze preference and unable to cross the midline to look to the left. Pupils equal reactive. Fundi not visualized. Blinks to threat on the right but not the left. Left lower facial weakness mild dysarthria but can be understood. Mild left-sided neglect. Tongue midline. Left hemiplegia with left upper extremity 3/5 with drift. Left lower extremity 4/5 strength. Sensation appears slightly diminished on the left compared to the right. Tone diminished on the left compared to the right. Left plantar upgoing right downgoing. Gait not tested. -NIH stroke scale score 7    Discharge Diet  Fall precautions DIET - DYS 1 Room service appropriate? Yes; Fluid consistency: Nectar Thick liquids  DISCHARGE PLAN  Disposition:  Transfer to [facility] for ongoing PT, OT and ST  aspirin 81 mg daily and Brilinta 90 mg twice daily. for secondary stroke prevention.  Recommend ongoing risk factor control by Primary Care Physician at time of discharge from inpatient rehabilitation.  Follow-up Lizbeth Bark, FNP in 2 weeks following discharge from rehab.  Follow-up with Dr. Delia Heady, Stroke Clinic in 6 weeks, office to schedule an appointment.   Follow-up with Dr. Corliss Skains in 4 weeks.  35 minutes were spent preparing discharge.  Delton See PA-C [facility] Pager [phone] 12/19/2017, 2:06 PM  I have personally examined this patient, reviewed notes, independently viewed imaging studies, participated in medical decision making and plan of care.ROS completed by me personally and pertinent positives fully documented  I have made any additions or clarifications directly to the above note. Agree with note  above.    Delia Heady, MD Medical Director [facility] Pager: [phone] 12/19/2017 2:26 PM

## 2017-12-19 NOTE — Evaluation (Signed)
Occupational Therapy Evaluation Patient Details Name: Heather [name] MRN: [id number] DOB: November 16, 1955 Today's Date: 12/19/2017    History of Present Illness PMH of HTN,Obesity admitted with left hemiparesis. RT MCA CVA. s/p rt common carotid arteriogram 3/18 with revascularization/stenting of right MCA stenosis.    Clinical Impression   PTA, pt was living granddaughter and was independent. Currently, requiring Min A for UB ADLs, Mod A for LB ADLs, and Min A for functional mobility. Pt presenting with inattention to left side, poor balance, decreased functional use of LUE, decreased vision, and cognitive deficits. Pt motivated to participate in therapy and has good family support. Pt would benefit from further acute OT to facilitate safe dc. Recommend dc to CIR for further OT to optimize safety, independence with ADLs, and return to PLOF.      Follow Up Recommendations  CIR;Supervision/Assistance - 24 hour    Equipment Recommendations  Other (comment)(Defer to next venue)    Recommendations for Other Services Rehab consult;PT consult;Speech consult     Precautions / Restrictions Precautions Precautions: Fall Restrictions Weight Bearing Restrictions: No      Mobility Bed Mobility Overal bed mobility: Needs Assistance Bed Mobility: Supine to Sit     Supine to sit: Min assist     General bed mobility comments: assist for lines with pt using momentum to elevate trunk with RUE assist to fully raise trunk from surface and increased time, cues to scoot to EOB  Transfers Overall transfer level: Needs assistance   Transfers: Sit to/from Stand Sit to Stand: Min guard         General transfer comment: guarding for balance to stand, decreased contol of descent with close guarding to sit to prevent fall    Balance Overall balance assessment: Needs assistance Sitting-balance support: No upper extremity supported;Feet supported Sitting balance-Leahy Scale: Fair       Standing  balance-Leahy Scale: Good                             ADL either performed or assessed with clinical judgement   ADL Overall ADL's : Needs assistance/impaired Eating/Feeding: Minimal assistance;Sitting   Grooming: Minimal assistance;Sitting;Brushing hair   Upper Body Bathing: Minimal assistance;Sitting   Lower Body Bathing: Moderate assistance;Sit to/from stand   Upper Body Dressing : Minimal assistance;Sitting   Lower Body Dressing: Moderate assistance;Sit to/from stand   Toilet Transfer: Minimal assistance;Ambulation(Simulated to recliner)           Functional mobility during ADLs: Minimal assistance General ADL Comments: Pt with decreased functional performance and is impacted by poor functional use of LUE, poor balance, vision deficits, and decreased cognition     Vision Baseline Vision/History: No visual deficits Patient Visual Report: No change from baseline Vision Assessment?: Yes;Vision impaired- to be further tested in functional context Eye Alignment: Within Functional Limits Tracking/Visual Pursuits: Decreased smoothness of horizontal tracking;Decreased smoothness of vertical tracking;Requires cues, head turns, or add eye shifts to track;Unable to hold eye position out of midline;Impaired - to be further tested in functional context Convergence: Impaired (comment) Visual Fields: Impaired-to be further tested in functional context Additional Comments: Pt with visual fatigue and difficulty maintaining attention to perform tracking test. When coming from left visual field, pt not acknowledging objects till near midline. Due to fatigue, unsure of visual field deficits vs cognition. Turning to left with head and eyes. Inattention to LUE     Perception     Praxis  Pertinent Vitals/Pain Pain Assessment: 0-10 Pain Score: 8  Pain Location: HA Pain Descriptors / Indicators: Aching Pain Intervention(s): Limited activity within patient's  tolerance;Monitored during session;Repositioned     Hand Dominance Right   Extremity/Trunk Assessment Upper Extremity Assessment Upper Extremity Assessment: LUE deficits/detail LUE Deficits / Details: Pt with decreased strength (distally<proximally). Very poor grasp strength and no finger opposition. Pt able to perform shoulder and elbow flexion against gravity. Pt able to bring LUE to nose and top of head LUE Coordination: decreased fine motor;decreased gross motor   Lower Extremity Assessment Lower Extremity Assessment: Defer to PT evaluation LLE Deficits / Details: 3/5 hip flexion, knee flexion and extension. pt reports equal sensation   Cervical / Trunk Assessment Cervical / Trunk Assessment: Normal   Communication Communication Communication: Prefers language other than English(spanish, daughter present with pt request for her to interpret)   Cognition Arousal/Alertness: Awake/alert Behavior During Therapy: WFL for tasks assessed/performed Overall Cognitive Status: Impaired/Different from baseline Area of Impairment: Following commands;Safety/judgement;Problem solving;Attention;Awareness                   Current Attention Level: Sustained   Following Commands: Follows one step commands with increased time;Follows one step commands inconsistently Safety/Judgement: Decreased awareness of deficits Awareness: Emergent Problem Solving: Requires verbal cues General Comments: Pt stating it is "quarter to one" and the time was 1038. With min VCs she states "twenty till 46," which is the correct time. Pt requiring increased cues to follow simple commands and requiring increased cues for visual testing and pt fatigues quickly.    General Comments  Daughter present throughout session and interpretting    Exercises     Shoulder Instructions      Home Living Family/patient expects to be discharged to:: Private residence Living Arrangements: Other  relatives(granddaughter) Available Help at Discharge: Family;Available 24 hours/day Type of Home: Apartment Home Access: Stairs to enter Entrance Stairs-Number of Steps: 12   Home Layout: One level     Bathroom Shower/Tub: Chief Strategy Officer: Standard     Home Equipment: None          Prior Functioning/Environment Level of Independence: Independent        Comments: ADLs and IADLs. Does not drive. babysits 5 kids and likes to drink beer        OT Problem List: Decreased strength;Decreased range of motion;Decreased activity tolerance;Impaired balance (sitting and/or standing);Impaired vision/perception;Decreased coordination;Decreased cognition;Decreased safety awareness;Decreased knowledge of use of DME or AE;Decreased knowledge of precautions;Impaired UE functional use;Pain;Obesity;Increased edema      OT Treatment/Interventions: Self-care/ADL training;Therapeutic exercise;Energy conservation;DME and/or AE instruction;Therapeutic activities;Patient/family education    OT Goals(Current goals can be found in the care plan section) Acute Rehab OT Goals Patient Stated Goal: return home OT Goal Formulation: With patient Time For Goal Achievement: 01/02/18 Potential to Achieve Goals: Good ADL Goals Pt Will Perform Grooming: with set-up;with supervision;standing Pt Will Perform Lower Body Dressing: with set-up;with supervision;sit to/from stand Pt Will Transfer to Toilet: with set-up;with supervision;ambulating;regular height toilet Additional ADL Goal #1: Pt will attend to 75% of object in left visual field during ADLs  OT Frequency: Min 2X/week   Barriers to D/C:            Co-evaluation   Reason for Co-Treatment: Complexity of the patient's impairments (multi-system involvement);For patient/therapist safety PT goals addressed during session: Mobility/safety with mobility        AM-PAC PT "6 Clicks" Daily Activity     Outcome Measure Help from  another person eating meals?: A Little Help from another person taking care of personal grooming?: A Little Help from another person toileting, which includes using toliet, bedpan, or urinal?: A Lot Help from another person bathing (including washing, rinsing, drying)?: A Lot Help from another person to put on and taking off regular upper body clothing?: A Little Help from another person to put on and taking off regular lower body clothing?: A Lot 6 Click Score: 15   End of Session Equipment Utilized During Treatment: Gait belt Nurse Communication: Mobility status  Activity Tolerance: Patient tolerated treatment well Patient left: in chair;with call bell/phone within reach;with chair alarm set;with family/visitor present  OT Visit Diagnosis: Unsteadiness on feet (R26.81);Other abnormalities of gait and mobility (R26.89);Muscle weakness (generalized) (M62.81);Other symptoms and signs involving cognitive function;Pain;Hemiplegia and hemiparesis;Low vision, both eyes (H54.2) Hemiplegia - Right/Left: Left Hemiplegia - dominant/non-dominant: Non-Dominant Hemiplegia - caused by: Cerebral infarction Pain - part of body: (Head)                Time: 1035-1101 OT Time Calculation (min): 26 min Charges:  OT General Charges $OT Visit: 1 Visit OT Evaluation $OT Eval Moderate Complexity: 1 Mod G-Codes:     Rhys Lichty MSOT, OTR/L Acute Rehab Pager: [phone] Office: [phone]  Theodoro Grist Alinna Siple 12/19/2017, 2:20 PM

## 2017-12-19 NOTE — H&P (Deleted)
  The note originally documented on this encounter has been moved the the encounter in which it belongs.  

## 2017-12-19 NOTE — Anesthesia Postprocedure Evaluation (Signed)
Anesthesia Post Note  Patient: Heather [name]  Procedure(s) Performed: RADIOLOGY WITH ANESTHESIA (N/A Head)     Patient location during evaluation: PACU Anesthesia Type: General Level of consciousness: patient cooperative and responds to stimulation Pain management: pain level controlled Vital Signs Assessment: post-procedure vital signs reviewed and stable Respiratory status: spontaneous breathing, nonlabored ventilation, respiratory function stable and patient connected to nasal cannula oxygen Cardiovascular status: blood pressure returned to baseline and stable Postop Assessment: no apparent nausea or vomiting Anesthetic complications: no    Last Vitals:  Vitals:   12/19/17 1300 12/19/17 1400  BP: 135/78 139/84  Pulse: 68 78  Resp: 15 19  Temp:    SpO2: 96% 99%    Last Pain:  Vitals:   12/19/17 1200  TempSrc: Oral  PainSc: 8                  Quaneisha Hanisch

## 2017-12-19 NOTE — Plan of Care (Signed)
  No Outcome Self-Care: Ability to participate in self-care as condition permits will improve 12/19/2017 1255 by Yates Decamp, RN Note Pt able to ambulate to [facility] Nutrition: Risk of aspiration will decrease 12/19/2017 1255 by Yates Decamp, RN Note Pureed diet with nectar thick liquid ordered by SLP Ischemic Stroke/TIA Tissue Perfusion: Complications of ischemic stroke/TIA will be minimized 12/19/2017 1255 by Yates Decamp, RN Note Pt off cleviprex, arterial line removed. Moving all four extremities, AOx4 Elimination: Will not experience complications related to urinary retention 12/19/2017 1255 by Yates Decamp, RN Note Foley catheter removed, pt successfully voiding Skin Integrity: Risk for impaired skin integrity will decrease 12/19/2017 1255 by Yates Decamp, RN Note Turned q2 hours, no skin breakdown assessed

## 2017-12-20 ENCOUNTER — Inpatient Hospital Stay (HOSPITAL_COMMUNITY): Payer: Self-pay | Admitting: Physical Therapy

## 2017-12-20 ENCOUNTER — Encounter (HOSPITAL_COMMUNITY): Payer: Self-pay | Admitting: Interventional Radiology

## 2017-12-20 ENCOUNTER — Inpatient Hospital Stay (HOSPITAL_COMMUNITY): Payer: Self-pay | Admitting: Occupational Therapy

## 2017-12-20 ENCOUNTER — Inpatient Hospital Stay (HOSPITAL_COMMUNITY): Payer: Self-pay | Admitting: Speech Pathology

## 2017-12-20 DIAGNOSIS — E669 Obesity, unspecified: Secondary | ICD-10-CM

## 2017-12-20 DIAGNOSIS — E1169 Type 2 diabetes mellitus with other specified complication: Secondary | ICD-10-CM

## 2017-12-20 LAB — COMPREHENSIVE METABOLIC PANEL
ALBUMIN: 3.4 g/dL — AB (ref 3.5–5.0)
ALT: 22 U/L (ref 14–54)
ANION GAP: 8 (ref 5–15)
AST: 25 U/L (ref 15–41)
Alkaline Phosphatase: 75 U/L (ref 38–126)
BUN: 7 mg/dL (ref 6–20)
CHLORIDE: 103 mmol/L (ref 101–111)
CO2: 26 mmol/L (ref 22–32)
Calcium: 9 mg/dL (ref 8.9–10.3)
Creatinine, Ser: 0.44 mg/dL (ref 0.44–1.00)
GFR calc non Af Amer: >60 mL/min (ref >60)
GLUCOSE: 209 mg/dL — AB (ref 65–99)
POTASSIUM: 4 mmol/L (ref 3.5–5.1)
SODIUM: 137 mmol/L (ref 135–145)
Total Bilirubin: 0.8 mg/dL (ref 0.3–1.2)
Total Protein: 6.7 g/dL (ref 6.5–8.1)

## 2017-12-20 LAB — GLUCOSE, CAPILLARY: Glucose-Capillary: 161 mg/dL — ABNORMAL HIGH (ref 65–99)

## 2017-12-20 LAB — CBC WITH DIFFERENTIAL/PLATELET
BASOS PCT: 0 %
Basophils Absolute: 0 10*3/uL (ref 0.0–0.1)
EOS PCT: 0 %
Eosinophils Absolute: 0 10*3/uL (ref 0.0–0.7)
HCT: 41.3 % (ref 36.0–46.0)
Hemoglobin: 13.4 g/dL (ref 12.0–15.0)
LYMPHS ABS: 1.6 10*3/uL (ref 0.7–4.0)
Lymphocytes Relative: 16 %
MCH: 30.1 pg (ref 26.0–34.0)
MCHC: 32.4 g/dL (ref 30.0–36.0)
MCV: 92.8 fL (ref 78.0–100.0)
MONOS PCT: 7 %
Monocytes Absolute: 0.7 10*3/uL (ref 0.1–1.0)
NEUTROS PCT: 77 %
Neutro Abs: 8.2 10*3/uL — ABNORMAL HIGH (ref 1.7–7.7)
PLATELETS: 250 10*3/uL (ref 150–400)
RBC: 4.45 MIL/uL (ref 3.87–5.11)
RDW: 12.9 % (ref 11.5–15.5)
WBC: 10.5 10*3/uL (ref 4.0–10.5)

## 2017-12-20 NOTE — Evaluation (Signed)
Occupational Therapy Assessment and Plan  Patient Details  Name: Heather [name] MRN: [id number] Date of Birth: 1956/08/19  OT Diagnosis: cognitive deficits and hemiplegia affecting non-dominant side Rehab Potential: Rehab Potential (ACUTE ONLY): Good ELOS: ~7-10 days   Today's Date: 12/20/2017 OT Individual Time: 6629-4765 OT Individual Time Calculation (min): 60 min     Problem List:  Patient Active Problem List   Diagnosis Date Noted  . 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 Left hemiparesis ([place])   . Dysphagia, oropharyngeal   . Middle cerebral artery embolism, 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  . Acute right arterial ischemic stroke, middle cerebral artery (MCA) ([place]) 12/17/2017    Past Medical History:  Past Medical History:  Diagnosis Date  . Hypertension    Past Surgical History:  Past Surgical History:  Procedure Laterality Date  . BREAST EXCISIONAL BIOPSY    . BREAST SURGERY     right breast  . CHOLECYSTECTOMY    . IR CT HEAD LTD  12/18/2017  . IR INTRA CRAN STENT  12/18/2017  . IR PERCUTANEOUS ART THROMBECTOMY/INFUSION INTRACRANIAL INC DIAG ANGIO  12/18/2017  . RADIOLOGY WITH ANESTHESIA N/A 12/18/2017   Procedure: RADIOLOGY WITH ANESTHESIA;  Surgeon: Luanne Bras, MD;  Location: [facility];  Service: Radiology;  Laterality: N/A;    Assessment & Plan Clinical Impression: Patient is a 62 y.o. year old female Spanish-speaking female with history of hypertension tobacco abuse. On no scheduled medications.Patient lives with grandaughter.Independent prior to admission. Grandaughter works during the day. 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 No associated hemorrhage. Mild regional mass effect with no midline shift.Patient did not receive TPA. CT angiogram head and neck showed abrupt proximal right M2 occlusion/ near occlusion.Underwent mechanical thrombectomy per  interventional radiology.Echocardiogram is pending.MRI pending. Currently maintained on aspirin as well as Brilinta for CVA prophylaxis. Close monitoring of blood pressure.Dysphagia #1 nectar thick liquid diet. Physical therapy evaluations completed M.D. Has requested physical medicine rehabilitation consult   Patient transferred to CIR on 12/19/2017 .    Patient currently requires mod with basic self-care skills and min A for basic mobility  secondary to muscle weakness, decreased cardiorespiratoy endurance, impaired timing and sequencing, unbalanced muscle activation, decreased coordination and decreased motor planning, decreased visual acuity and decreased visual perceptual skills, decreased midline orientation, decreased attention to left and decreased motor planning, decreased attention, decreased awareness, decreased problem solving, decreased safety awareness, decreased memory and delayed processing and decreased sitting balance, decreased standing balance, decreased postural control, hemiplegia, decreased balance strategies and difficulty maintaining precautions.  Prior to hospitalization, patient could complete ADL with independent .  Patient will benefit from skilled intervention to decrease level of assist with basic self-care skills and increase independence with basic self-care skills prior to discharge home with care partner.  Anticipate patient will require 24 hour supervision and follow up outpatient.  OT - End of Session Activity Tolerance: Tolerates 30+ min activity with multiple rests OT Assessment Rehab Potential (ACUTE ONLY): Good OT Patient demonstrates impairments in the following area(s): Balance;Cognition;Safety;Perception;Edema;Endurance;Motor;Pain;Vision OT Basic ADL's Functional Problem(s): Eating;Bathing;Dressing;Toileting;Grooming OT Transfers Functional Problem(s): Toilet;Tub/Shower OT Additional Impairment(s): Fuctional Use of Upper Extremity OT Plan OT Intensity:  Minimum of 1-2 x/day, 45 to 90 minutes OT Frequency: 5 out of 7 days OT Duration/Estimated Length of Stay: ~7-10 days OT Treatment/Interventions: Balance/vestibular training;Discharge planning;Pain management;Self Care/advanced ADL retraining;Therapeutic Activities;UE/LE Coordination activities;Cognitive remediation/compensation;Disease mangement/prevention;Functional mobility training;Patient/family education;Skin care/wound managment;Therapeutic Exercise;Visual/perceptual remediation/compensation;Community reintegration;DME/adaptive equipment instruction;Neuromuscular re-education;Psychosocial support;Splinting/orthotics;Wheelchair  propulsion/positioning;UE/LE Strength taining/ROM;Functional electrical stimulation OT Self Feeding Anticipated Outcome(s): supervision OT Basic Self-Care Anticipated Outcome(s): supervision OT Toileting Anticipated Outcome(s): supervision  OT Bathroom Transfers Anticipated Outcome(s): supervision OT Recommendation Recommendations for Other Services: Neuropsych consult Patient destination: Home Follow Up Recommendations: Outpatient OT Equipment Recommended: To be determined   Skilled Therapeutic Intervention Ot eval initiated with OT goals, purpose and role discussed. Pt reported she had a bad HA and had vomited on the floor earlier. Pt agreeable to self care retraining and eval out of bed. Pt ambulated to bathroom with HHA with min A with mod cues for attention to left when navigating through doorways and objects. Pt pulled out too much toilet paper for hygiene with decr awareness of how much she had. Pt bathed at shower level and donned hospital gowns. Mod cues to incorporate left hand in tasks with decr distal control and dexterity.  Pt had soiled clothing and linen and pt unaware when got up from bed. Pt's pjs were soaked with urine. Pt ambulated with HHA with mod VC to the dayroom. Performed [facility] tasks with small objects with focus on normal patterns of movement. Pt  with increased ability to demonstrate grasp and release and finger pinch with time and practice.   Daughter present in session.  Left pt up in recliner with chair alarm.     OT Evaluation Precautions/Restrictions  Precautions Precautions: Fall Restrictions Weight Bearing Restrictions: No General Chart Reviewed: Yes Family/Caregiver Present: Yes(daughter)   Pain Pain Assessment Pain Assessment: No/denies pain Pain Score: 5  Pain Type: Acute pain Pain Location: Head Pain Intervention(s): Medication (See eMAR) Home Living/Prior Functioning Home Living Available Help at Discharge: Family, Available 24 hours/day Type of Home: Apartment Home Access: Stairs to enter Technical brewer of Steps: 12 Home Layout: One level Bathroom Shower/Tub: Chiropodist: Standard  Lives With: Family ADL ADL ADL Comments: see functional navigator Vision Baseline Vision/History: No visual deficits Patient Visual Report: No change from baseline Eye Alignment: Within Functional Limits Tracking/Visual Pursuits: Decreased smoothness of horizontal tracking;Decreased smoothness of vertical tracking;Requires cues, head turns, or add eye shifts to track;Unable to hold eye position out of midline;Impaired - to be further tested in functional context Convergence: Impaired (comment) Additional Comments: decr visual scanning to the left will need to continue to assess - eyes can track but environmentally runs into objects Perception  Perception: Impaired Inattention/Neglect: Does not attend to left visual field Praxis Praxis: Impaired Praxis Impairment Details: Motor planning Cognition Overall Cognitive Status: Impaired/Different from baseline Arousal/Alertness: Awake/alert Orientation Level: Person;Place;Situation Person: Oriented Place: Oriented Situation: Oriented Year: 2019 Month: March Day of Week: Incorrect(Monday) Memory: Impaired Memory Impairment: Decreased recall  of new information Immediate Memory Recall: Sock;Blue;Bed Memory Recall: Sock;Blue;Bed Memory Recall Sock: With Cue Memory Recall Blue: With Cue Memory Recall Bed: With Cue Attention: Selective Selective Attention: Impaired Awareness: Impaired Awareness Impairment: Intellectual impairment Problem Solving: Impaired Safety/Judgment: Impaired Sensation Sensation Light Touch: Impaired Detail Light Touch Impaired Details: Impaired LUE Proprioception: Impaired Detail Proprioception Impaired Details: Impaired LUE Coordination Gross Motor Movements are Fluid and Coordinated: No Fine Motor Movements are Fluid and Coordinated: No Coordination and Movement Description: decr left UE coordination Motor  Motor Motor: Hemiplegia;Motor apraxia Mobility  Transfers Transfers: Sit to Stand;Stand to Sit Sit to Stand: 4: Min assist Stand to Sit: 4: Min assist  Trunk/Postural Assessment  Cervical Assessment Cervical Assessment: Within Functional Limits Thoracic Assessment Thoracic Assessment: Within Functional Limits Lumbar Assessment Lumbar Assessment: Within Functional Limits Postural Control Postural Control: Within  Functional Limits  Balance Balance Balance Assessed: Yes Static Sitting Balance Static Sitting - Level of Assistance: 5: Stand by assistance Dynamic Sitting Balance Dynamic Sitting - Level of Assistance: 5: Stand by assistance Static Standing Balance Static Standing - Level of Assistance: 4: Min assist Dynamic Standing Balance Dynamic Standing - Level of Assistance: 4: Min assist Extremity/Trunk Assessment RUE Assessment RUE Assessment: Within Functional Limits LUE Assessment LUE Assessment: Exceptions to [facility] LUE Strength LUE Overall Strength Comments: Brunnstrom IV LUE Tone LUE Tone: Within Functional Limits   See Function Navigator for Current Functional Status.   Refer to Care Plan for Long Term Goals  Recommendations for other services:  Neuropsych   Discharge Criteria: Patient will be discharged from OT if patient refuses treatment 3 consecutive times without medical reason, if treatment goals not met, if there is a change in medical status, if patient makes no progress towards goals or if patient is discharged from hospital.  The above assessment, treatment plan, treatment alternatives and goals were discussed and mutually agreed upon: by patient and by family  Nicoletta Ba 12/20/2017, 12:18 PM

## 2017-12-20 NOTE — Evaluation (Signed)
Physical Therapy Assessment and Plan  Patient Details  Name: Heather [name] MRN: [id number] Date of Birth: [birth year]  PT Diagnosis: Abnormality of gait, Coordination disorder and Hemiparesis non-dominant Rehab Potential: Good ELOS: 7-10 days   Today's Date: 12/20/2017 PT Individual Time: 1300-1415 PT Individual Time Calculation (min): 75 min    Problem List:  Patient Active Problem List   Diagnosis Date Noted  . 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 Left hemiparesis ([place])   . Dysphagia, oropharyngeal   . Middle cerebral artery embolism, 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  . Acute right arterial ischemic stroke, middle cerebral artery (MCA) ([place]) 12/17/2017    Past Medical History:  Past Medical History:  Diagnosis Date  . Hypertension    Past Surgical History:  Past Surgical History:  Procedure Laterality Date  . BREAST EXCISIONAL BIOPSY    . BREAST SURGERY     right breast  . CHOLECYSTECTOMY    . IR CT HEAD LTD  12/18/2017  . IR INTRA CRAN STENT  12/18/2017  . IR PERCUTANEOUS ART THROMBECTOMY/INFUSION INTRACRANIAL INC DIAG ANGIO  12/18/2017  . RADIOLOGY WITH ANESTHESIA N/A 12/18/2017   Procedure: RADIOLOGY WITH ANESTHESIA;  Surgeon: Luanne Bras, MD;  Location: [facility];  Service: Radiology;  Laterality: N/A;    Assessment & Plan Clinical Impression: A 62 y.o.right handed Spanish-speaking female with history of hypertension and  tobacco abuse. On no scheduled medications. Patient lives with grandaughter.  Independent prior to admission. Grandaughter works during the day. 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 No associated hemorrhage. Mild regional mass effect with no midline shift.Patient did not receive TPA. CT angiogram head and neck showed abrupt proximal right M2 occlusion/ near occlusion.Underwent mechanical thrombectomy per interventional radiology.Echocardiogram  is pending. Currently maintained on aspirin as well as Brilinta for CVA prophylaxis. Close monitoring of blood pressure. Physical and occupational therapy evaluations are pending. M.D. Has requested physical medicine rehabilitation consult.  Patient transferred to CIR on 12/19/2017 .   Patient currently requires min with mobility secondary to muscle weakness, impaired timing and sequencing, unbalanced muscle activation and decreased coordination, decreased attention to left, decreased initiation, decreased attention, decreased awareness, decreased problem solving, decreased safety awareness, decreased memory and delayed processing and decreased standing balance, decreased postural control, hemiplegia and decreased balance strategies.  Prior to hospitalization, patient was independent  with mobility and lived with Family in a [place] home.  Home access is 12Stairs to enter(pt lives in second story apartment, but her sister lives in a first floor apartment).  Patient will benefit from skilled PT intervention to maximize safe functional mobility, minimize fall risk and decrease caregiver burden for planned discharge home with 24 hour supervision.  Anticipate patient will benefit from follow up [place] at discharge.  PT - End of Session Activity Tolerance: Tolerates 30+ min activity without fatigue PT Assessment Rehab Potential (ACUTE/IP ONLY): Good PT Patient demonstrates impairments in the following area(s): Balance;Endurance;Perception;Safety;Motor PT Transfers Functional Problem(s): Bed Mobility;Bed to Chair;Car;Furniture PT Locomotion Functional Problem(s): Stairs;Wheelchair Mobility;Ambulation PT Plan PT Intensity: Minimum of 1-2 x/day ,45 to 90 minutes PT Frequency: 5 out of 7 days PT Duration Estimated Length of Stay: 7-10 days PT Treatment/Interventions: Ambulation/gait training;Community reintegration;DME/adaptive equipment instruction;Neuromuscular re-education;Psychosocial support;Stair  training;UE/LE Strength taining/ROM;Wheelchair propulsion/positioning;UE/LE Coordination activities;Therapeutic Activities;Balance/vestibular training;Discharge planning;Cognitive remediation/compensation;Disease management/prevention;Functional mobility training;Patient/family education;Splinting/orthotics;Therapeutic Exercise;Visual/perceptual remediation/compensation PT Transfers Anticipated Outcome(s): supervision PT Locomotion Anticipated Outcome(s): supervision PT Recommendation Follow Up Recommendations: Home health PT;24 hour supervision/assistance Patient destination: Home  Equipment Recommended: To be determined  Skilled Therapeutic Intervention Initially pt lethargic, c/o 6/10 pain with pre-medication by RN about 60 minutes prior.  Agreeable to sit EOB to start session with consuming lunch.  Session focus on initial PT evaluation, pt education on rehab goals, ELOS, and plan of care.  Pt currently performing mobility with assist as described below.  Demonstrates significant L inattention, but is able to correct with mod cues.  Returned to bed at end of session and positioned with alarm activated, call bell in reach and needs met.  PT Evaluation Precautions/Restrictions Precautions Precautions: Fall Precaution Comments: L inattention Restrictions Weight Bearing Restrictions: No Pain Pain Assessment Pain Assessment: No/denies pain Home Living/Prior Functioning Home Living Available Help at Discharge: Family;Available 24 hours/day Type of Home: Apartment Home Access: Stairs to enter(pt lives in second story apartment, but her sister lives in a first floor apartment) Technical brewer of Steps: 12 Home Layout: One level Bathroom Shower/Tub: Chiropodist: Standard  Lives With: Family Prior Function Level of Independence: Independent with transfers;Independent with gait  Able to Take Stairs?: Yes Driving: No Vocation: Works at  [employer]) Vision/Perception  Vision - Assessment Eye Alignment: Within Functional Limits Tracking/Visual Pursuits: Decreased smoothness of horizontal tracking;Decreased smoothness of vertical tracking;Requires cues, head turns, or add eye shifts to track;Unable to hold eye position out of midline;Impaired - to be further tested in functional context Convergence: Impaired (comment) Additional Comments: decr visual scanning to the left will need to continue to assess - eyes can track but environmentally runs into objects Perception Perception: Impaired Inattention/Neglect: Does not attend to left visual field Praxis Praxis: Intact Praxis Impairment Details: Motor planning  Cognition Overall Cognitive Status: Impaired/Different from baseline Arousal/Alertness: Awake/alert Orientation Level: Oriented X4 Attention: Selective Selective Attention: Impaired Selective Attention Impairment: Verbal complex;Functional complex Memory: Impaired Memory Impairment: Decreased recall of new information Awareness: Impaired Awareness Impairment: Intellectual impairment;Emergent impairment Problem Solving: Impaired Problem Solving Impairment: Verbal complex;Functional complex Executive Function: Self Correcting Self Correcting: Impaired Self Correcting Impairment: Verbal complex;Functional complex Safety/Judgment: Impaired Sensation Sensation Light Touch: Appears Intact(LEs) Light Touch Impaired Details: Impaired LUE Proprioception: Impaired Detail Proprioception Impaired Details: Impaired LUE Coordination Gross Motor Movements are Fluid and Coordinated: No Fine Motor Movements are Fluid and Coordinated: No Coordination and Movement Description: decr left UE coordination Motor  Motor Motor: Hemiplegia  Mobility Bed Mobility Bed Mobility: Sit to Supine;Supine to Sit Supine to Sit: 5: Supervision Sit to Supine: 5: Supervision Transfers Transfers: Yes Sit to Stand: 4: Min guard Stand  to Sit: 4: Min guard Locomotion  Ambulation Ambulation: Yes Ambulation/Gait Assistance: 4: Min assist Ambulation Distance (Feet): 300 Feet Assistive device: 1 person hand held assist Ambulation/Gait Assistance Details: Verbal cues for technique;Verbal cues for precautions/safety Stairs / Additional Locomotion Stairs: Yes Stairs Assistance: 4: Min assist Stair Management Technique: One rail Right Number of Stairs: 4  Trunk/Postural Assessment  Cervical Assessment Cervical Assessment: Within Functional Limits Thoracic Assessment Thoracic Assessment: Within Functional Limits Lumbar Assessment Lumbar Assessment: Within Functional Limits Postural Control Postural Control: Within Functional Limits  Balance Balance Balance Assessed: Yes Static Sitting Balance Static Sitting - Balance Support: Feet supported Static Sitting - Level of Assistance: 5: Stand by assistance Dynamic Sitting Balance Dynamic Sitting - Balance Support: Feet supported;During functional activity Dynamic Sitting - Level of Assistance: 5: Stand by assistance Static Standing Balance Static Standing - Balance Support: During functional activity;No upper extremity supported Static Standing - Level of Assistance: 4: Min assist Dynamic Standing Balance Dynamic Standing - Level  of Assistance: 4: Min assist Extremity Assessment  RUE Assessment RUE Assessment: Within Functional Limits LUE Assessment LUE Assessment: Exceptions to [facility] LUE Strength LUE Overall Strength Comments: Brunnstrom IV LUE Tone LUE Tone: Within Functional Limits RLE Strength RLE Overall Strength Comments: generalized weakness in hips, but knees and ankles WFL LLE Strength LLE Overall Strength Comments: generalized weakness in hips, but WFL for knees and ankles   See Function Navigator for Current Functional Status.   Refer to Care Plan for Long Term Goals  Recommendations for other services: None   Discharge Criteria: Patient will be  discharged from PT if patient refuses treatment 3 consecutive times without medical reason, if treatment goals not met, if there is a change in medical status, if patient makes no progress towards goals or if patient is discharged from hospital.  The above assessment, treatment plan, treatment alternatives and goals were discussed and mutually agreed upon: by patient  Michel Santee 12/20/2017, 2:29 PM

## 2017-12-20 NOTE — Evaluation (Signed)
Speech Language Pathology Assessment and Plan  Patient Details  Name: Heather [name] MRN: [id number] Date of Birth: 05/27/1956  SLP Diagnosis: Dysphagia;Cognitive Impairments  Rehab Potential: Good ELOS: 7 to 10 days    Today's Date: 12/20/2017 SLP Individual Time: 1100-1200 SLP Individual Time Calculation (min): 60 min   Problem List:  Patient Active Problem List   Diagnosis Date Noted  . 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 Left hemiparesis ([place])   . Dysphagia, oropharyngeal   . Middle cerebral artery embolism, 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  . Acute right arterial ischemic stroke, middle cerebral artery (MCA) ([place]) 12/17/2017   Past Medical History:  Past Medical History:  Diagnosis Date  . Hypertension    Past Surgical History:  Past Surgical History:  Procedure Laterality Date  . BREAST EXCISIONAL BIOPSY    . BREAST SURGERY     right breast  . CHOLECYSTECTOMY    . IR CT HEAD LTD  12/18/2017  . IR INTRA CRAN STENT  12/18/2017  . IR PERCUTANEOUS ART THROMBECTOMY/INFUSION INTRACRANIAL INC DIAG ANGIO  12/18/2017  . RADIOLOGY WITH ANESTHESIA N/A 12/18/2017   Procedure: RADIOLOGY WITH ANESTHESIA;  Surgeon: Luanne Bras, MD;  Location: [facility];  Service: Radiology;  Laterality: N/A;    Assessment / Plan / Recommendation Clinical Impression Heather [name] is a 62 y.o.right handed Spanish-speaking female with history of hypertension tobacco abuse. On no scheduled medications.Patient lives with grandaughter. Independent prior to admission. Grandaughter works during the day. 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 No associated hemorrhage. Mild regional mass effect with no midline shift.Patient did not receive TPA. CT angiogram head and neck showed abrupt proximal right M2 occlusion/ near occlusion.Underwent mechanical thrombectomy per interventional radiology.Echocardiogram  is pending.MRI pending. Currently maintained on aspirin as well as Brilinta for CVA prophylaxis. Close monitoring of blood pressure. Dysphagia #1 nectar thick liquid diet. Physical therapy evaluations completed M.D. Has requested physical medicine rehabilitation consult. Patient was admitted for comprehensive rehabilitation program on 12/19/17.    Bedside Swallow evaluation and cognitive linguistic evaluations performed on 12/20/17. Interpreter present along with pt's daughter. Pt presents with mild cognitive deficits in the areas of left inattention, overall decreased selective attention (in mildly distracting environment), recall of known and new information, demonstrates increased processing times and mild deficits in semi-complex problem solving. Pt exhibits left facial weakness but it isn't impactful on speech intelligibility at the conversation level. Pt also presents with moderate oral phase and mild pharyngeal phase dysphagia. Pt with decreased bolus management of dysphagia 2 textures as evidenced by increased residue in left buccal cavity. Pt also presents with mild pharyngeal phase dysphagia c/b immediate coughing with consecutive sips of thin liquids via cup. Possibly related to decreased laryngeal closure, delayed swallow initiation or large bolus size. Recommend pt remain on dysphagia 1 with nectar thick liquids, Modified Barium Swallow Study scheduled for 3/21 at 0900 to further assess swallow function. Skilled ST is required to address the above mentioned deficits, increase functional independence and reduce caregiver burden. Anticipate that pt will require supervision at discharge with no follow up ST services.    Skilled Therapeutic Interventions          Skilled treatment session focused on completion of bedside swallow evaluation and cognitive linguistic evaluations, see above. Pt required Mod A cues to clear left buccal area of residue with trials of dysphagia 2. Pt with coughing on thin  liquids. Recommend MBS on 3/20 to assess  safety and possibility of diet advancement. Pt required Mod A cues to demonstrate selective attention in mildly distracting environment. Education provided to daughter and pt. All questions answered to their satisfaction.    SLP Assessment  Patient will need skilled Speech Lanaguage Pathology Services during CIR admission    Recommendations  SLP Diet Recommendations: Dysphagia 1 (Puree);Nectar Liquid Administration via: Cup Medication Administration: Whole meds with puree Supervision: Patient able to self feed Compensations: Minimize environmental distractions;Slow rate;Small sips/bites;Lingual sweep for clearance of pocketing Postural Changes and/or Swallow Maneuvers: Seated upright 90 degrees Oral Care Recommendations: Oral care BID Patient destination: Home Follow up Recommendations: 24 hour supervision/assistance Equipment Recommended: To be determined    SLP Frequency 3 to 5 out of 7 days   SLP Duration  SLP Intensity  SLP Treatment/Interventions 7 to 10 days  Minumum of 1-2 x/day, 30 to 90 minutes  Cognitive remediation/compensation;Cueing hierarchy;Dysphagia/aspiration precaution training;Functional tasks;Patient/family education;Therapeutic Activities    Pain Pain Assessment Pain Assessment: No/denies pain  Prior Functioning Cognitive/Linguistic Baseline: Within functional limits Type of Home: Apartment  Lives With: Family Available Help at Discharge: Family;Available 24 hours/day Vocation: Works at [employer])  Function:  Eating Eating   Modified Consistency Diet: Yes Eating Assist Level: More than reasonable amount of time           Cognition Comprehension Comprehension assist level: Understands complex 90% of the time/cues 10% of the time;Follows basic conversation/direction with no assist  Expression   Expression assist level: Expresses complex ideas: With extra time/assistive device  Social Interaction  Social Interaction assist level: Interacts appropriately with others with medication or extra time (anti-anxiety, antidepressant).  Problem Solving Problem solving assist level: Solves complex 90% of the time/cues < 10% of the time  Memory Memory assist level: Recognizes or recalls 90% of the time/requires cueing < 10% of the time;More than reasonable amount of time   Short Term Goals: Week 1: SLP Short Term Goal 1 (Week 1): Pt will demonstrate selective attention in moderately distracting environment for ~ 45 minutes with supervision cues.  SLP Short Term Goal 2 (Week 1): Pt will demonstrate intellectual awareness by listing 3 physical and 2 cognitive/dysphagia deficits related to acute CVa with supervision cues.  SLP Short Term Goal 3 (Week 1): Pt will scan to left of environment to locate items in 90% of opportunities with supervision cues.  SLP Short Term Goal 4 (Week 1): Pt will consume least restrictive diet with minimal overt s/s of aspiration with supervision cues for use of compensatory swallow strategies.  SLP Short Term Goal 5 (Week 1): Pt will complete semi-complex problem solving tasks with supervision cues.   Refer to Care Plan for Long Term Goals  Recommendations for other services: None   Discharge Criteria: Patient will be discharged from SLP if patient refuses treatment 3 consecutive times without medical reason, if treatment goals not met, if there is a change in medical status, if patient makes no progress towards goals or if patient is discharged from hospital.  The above assessment, treatment plan, treatment alternatives and goals were discussed and mutually agreed upon: by patient and by family  Tykel Badie 12/20/2017, 2:21 PM

## 2017-12-20 NOTE — Progress Notes (Signed)
Patient information reviewed and entered into eRehab system by Lalitha Ilyas, RN, CRRN, PPS Coordinator.  Information including medical coding and functional independence measure will be reviewed and updated through discharge.    

## 2017-12-20 NOTE — Progress Notes (Signed)
Ranelle Oyster, MD  Physician  Physical Medicine and Rehabilitation  Consult Note  Signed  Date of Service:  12/19/2017 9:51 AM       Related encounter: ED to Hosp-Admission (Discharged) from 12/17/2017 in [facility] NEURO/TRAUMA/SURGICAL ICU      Signed      Expand All Collapse All       [] Hide copied text  [] Hover for details        Physical Medicine and Rehabilitation Consult Reason for Consult:Left side weakness with dysarthria Referring Physician: Dr.   HPI: Heather [name] is a 62 y.o.right handed Spanish-speaking female with history of hypertension tobacco abuse. On no scheduled medications.Patient lives with grandaughter.Independent prior to admission. Grandaughter works during the day. 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 No associated hemorrhage. Mild regional mass effect with no midline shift.Patient did not receive TPA. CT angiogram head and neck showed abrupt proximal right M2 occlusion/ near occlusion.Underwent mechanical thrombectomy per interventional radiology.Echocardiogram is pending. Currently maintained on aspirin as well as Brilinta for CVA prophylaxis. Close monitoring of blood pressure. Physical and occupational therapy evaluations are pending. M.D. Has requested physical medicine rehabilitation consult.   Review of Systems  Unable to perform ROS: Acuity of condition       Past Medical History:  Diagnosis Date  . Hypertension         Past Surgical History:  Procedure Laterality Date  . BREAST EXCISIONAL BIOPSY    . BREAST SURGERY     right breast  . CHOLECYSTECTOMY          Family History  Problem Relation Age of Onset  . Diabetes Mother   . Pulmonary disease Mother   . Heart disease Father    Social History:  reports that she has been smoking cigarettes.  She has a 15.00 pack-year smoking history.  she has never used smokeless tobacco. She reports that she drinks alcohol. She reports that she does not use drugs. Allergies: No Known Allergies No medications prior to admission.    Home:    Functional History: Functional Status:  Mobility:  ADL:  Cognition: Cognition Orientation Level: Oriented X4  Blood pressure 111/63, pulse 74, temperature 98.5 F (36.9 C), temperature source Oral, resp. rate 14, weight 120.3 kg (265 lb 3.4 oz), SpO2 98 %. Physical Exam  Vitals reviewed. Constitutional: She appears well-developed.  Eyes:  Pupils round and reactive to light  Neck: Normal range of motion. Neck supple. No thyromegaly present.  Cardiovascular: Normal rate, regular rhythm and normal heart sounds.  Respiratory: Effort normal and breath sounds normal. No respiratory distress.  GI: Soft. Bowel sounds are normal. She exhibits no distension.  Neurological: She is alert.  Right gaze preference. Limited English speaking. Followed simple commands. LUE 2-/5 prox to distal and LLE 2 to 3/5 prox to distal. Senses pain in left upper and lower. Left central 7 and tongue deviation.   Skin: Skin is warm and dry.  Psychiatric: She has a normal mood and affect. Her behavior is normal.            Assessment/Plan: Diagnosis: Right MCA infarct with left hemiparesis and visual-spatial deficits 1. Does the need for close, 24 hr/day medical supervision in concert with the patient's rehab needs make it unreasonable for this patient to be served in a less intensive setting? Yes 2. Co-Morbidities requiring supervision/potential complications: HTN, post-stroke sequelae 3. Due to bladder management, bowel management, safety, skin/wound  care, disease management, medication administration, pain management and patient education, does the patient require 24 hr/day rehab nursing? Yes 4. Does the patient require coordinated care of a physician, rehab nurse, PT (1-2 hrs/day, 5 days/week), OT (1-2  hrs/day, 5 days/week) and SLP (1-2 hrs/day, 5 days/week) to address physical and functional deficits in the context of the above medical diagnosis(es)? Yes Addressing deficits in the following areas: balance, endurance, locomotion, strength, transferring, bowel/bladder control, bathing, dressing, feeding, grooming, toileting, cognition, speech, swallowing and psychosocial support 5. Can the patient actively participate in an intensive therapy program of at least 3 hrs of therapy per day at least 5 days per week? Yes 6. The potential for patient to make measurable gains while on inpatient rehab is excellent 7. Anticipated functional outcomes upon discharge from inpatient rehab are supervision  with PT, supervision and min assist with OT, supervision with SLP. 8. Estimated rehab length of stay to reach the above functional goals is: 13-20 days 9. Anticipated D/C setting: Home 10. Anticipated post D/C treatments: HH therapy 11. Overall Rehab/Functional Prognosis: excellent  RECOMMENDATIONS: This patient's condition is appropriate for continued rehabilitative care in the following setting: CIR Patient has agreed to participate in recommended program. Yes Note that insurance prior authorization may be required for reimbursement for recommended care.  Comment: Rehab Admissions Coordinator to follow up.  Thanks,  Ranelle Oyster, MD, [place] [facility]    Mcarthur Rossetti Angiulli, PA-C 12/19/2017          Revision History                        Routing History

## 2017-12-20 NOTE — Progress Notes (Signed)
[facility] PHYSICAL MEDICINE & REHABILITATION     PROGRESS NOTE    Subjective/Complaints: Pt with headache per RN. Tylenol 3 didn't seem to be impacting it much this morning per RN, but when I arrived, pt indicated that headache was in fact improving.   ROS: limited due to language/communication   Objective: Vital Signs: Blood pressure (!) 146/78, pulse 86, temperature 98.7 F (37.1 C), temperature source Oral, resp. rate 19, height 5\' 8"  (1.727 m), weight 119.3 kg (263 lb 0.1 oz), SpO2 99 %. Heather [name] Wo Contrast  Result Date: 12/19/2017 CLINICAL DATA:  Left-sided weakness, headache, and dysarthria. Right MCA infarct. Right M2 occlusion status post revascularization. EXAM: MRI HEAD WITHOUT CONTRAST TECHNIQUE: Multiplanar, multiecho pulse sequences of the [name] and surrounding structures were obtained without intravenous contrast. COMPARISON:  Head CT 12/18/2017 FINDINGS: [name]: There is a large acute right MCA territory infarct involving the insula, temporal stem, frontal lobe, and parietal lobe. Susceptibility weighted imaging is severely motion degraded, however there is evidence of confluent petechial type hemorrhage throughout much of the infarct. No malignant hemorrhagic transformation is identified. There is associated cytotoxic edema with regional sulcal effacement and mild mass effect on the right basal ganglia and lateral ventricle with only trace leftward midline shift. A punctate acute infarct is noted peripherally in the right cerebellar hemisphere. Scattered periventricular and subcortical white matter T2 hyperintensities bilaterally are nonspecific but compatible with mild chronic small vessel ischemic disease. Cerebral volume is normal for age. Vascular: Major intracranial vascular flow voids are preserved. Susceptibility artifact is noted from the right MCA stent. Skull and upper cervical spine: Unremarkable bone marrow signal. Sinuses/Orbits: Unremarkable orbits. Mild right sphenoid  sinus mucosal thickening. Clear mastoid air cells. Other: None. IMPRESSION: 1. Large acute right MCA infarct with petechial hemorrhage. Mild regional mass effect with trace midline shift. 2. Punctate acute right cerebellar infarct. 3. Mild chronic small vessel ischemic disease. Electronically Signed   By: Sebastian Ache M.D.   On: 12/19/2017 16:20   Recent Labs    12/19/17 0513 12/20/17 0738  WBC 10.9* 10.5  HGB 12.3 13.4  HCT 37.8 41.3  PLT 237 250   Recent Labs    12/19/17 0513 12/20/17 0738  NA 138 137  K 3.5 4.0  CL 104 103  GLUCOSE 184* 209*  BUN <5* 7  CREATININE 0.39* 0.44  CALCIUM 8.6* 9.0   CBG (last 3)  Recent Labs    12/17/17 2256  GLUCAP 184*    Wt Readings from Last 3 Encounters:  12/19/17 119.3 kg (263 lb 0.1 oz)  12/17/17 120.3 kg (265 lb 3.4 oz)  07/27/17 117.1 kg (258 lb 3.2 oz)    Physical Exam:  Constitutional: She appears well-nourished. Does not appear to be in distress  Eyes: PERRL  Neck: Normal range of motion. Neck supple. No tracheal deviation present. No thyromegaly present.  Cardiovascular: RRR without murmur. No JVD   No murmur heard.  Respiratory: Effort normal and breath sounds normal. No respiratory distress. She has no wheezes. She has no rales.  GI: Soft. Bowel sounds are normal. She exhibits no distension.  Musculoskeletal: She exhibits no edema or deformity.  Skin. Warm and dry  Neurological. She is alert. Right gaze preference. Seems to follow commands in her language. Left central 7 and tongue deviation. +left inattention---persistent LUE grossly 2 to 2+/5 prox to distal.   3-3+/5 prox to distal with some difficulties activitating on command.  Pain sense dimished left arm to LT. RUE and RLE  grossly 4+ to 5/5 with normal sensory function. No resting tone.  Psych: pleasant and cooperative    Assessment/Plan: 1. Functional deficits secondary to right MCA infarct which require 3+ hours per day of interdisciplinary therapy in a  comprehensive inpatient rehab setting. Physiatrist is providing close team supervision and 24 hour management of active medical problems listed below. Physiatrist and rehab team continue to assess barriers to discharge/monitor patient progress toward functional and medical goals.  Function:  Bathing Bathing position   Position: Shower  Bathing parts Body parts bathed by patient: Left arm, Chest, Abdomen, Front perineal area, Right upper leg, Left upper leg Body parts bathed by helper: Buttocks, Right lower leg, Left lower leg, Back, Right arm  Bathing assist Assist Level: Touching or steadying assistance(Pt > 75%)      Upper Body Dressing/Undressing Upper body dressing   What is the patient wearing?: Hospital gown                Upper body assist        Lower Body Dressing/Undressing Lower body dressing   What is the patient wearing?: Hospital Gown, Non-skid slipper socks           Non-skid slipper socks- Performed by helper: Don/doff right sock, Don/doff left sock                  Lower body assist Assist for lower body dressing: Touching or steadying assistance (Pt > 75%)      Toileting Toileting   Toileting steps completed by patient: Adjust clothing prior to toileting, Performs perineal hygiene, Adjust clothing after toileting Toileting steps completed by helper: Adjust clothing after toileting Toileting Assistive Devices: Grab bar or rail  Toileting assist Assist level: Touching or steadying assistance (Pt.75%)   Transfers Chair/bed transfer   Chair/bed transfer method: Ambulatory, Stand pivot Chair/bed transfer assist level: Touching or steadying assistance (Pt > 75%)       Locomotion Ambulation     Max distance: 300 Assist level: Touching or steadying assistance (Pt > 75%)   Wheelchair          Cognition Comprehension Comprehension assist level: Understands complex 90% of the time/cues 10% of the time, Follows basic conversation/direction  with no assist  Expression Expression assist level: Expresses complex ideas: With extra time/assistive device  Social Interaction Social Interaction assist level: Interacts appropriately with others with medication or extra time (anti-anxiety, antidepressant).  Problem Solving Problem solving assist level: Solves complex 90% of the time/cues < 10% of the time  Memory Memory assist level: Recognizes or recalls 90% of the time/requires cueing < 10% of the time, More than reasonable amount of time   Medical Problem List and Plan:  1. Left hemiparesis and visual spatial deficits secondary to right MCA infarction  -beginning therapies today 2. DVT Prophylaxis/Anticoagulation: Brilinta  3. Pain Management: Tylenol 3 for headaches as needed    -if persistent consider topamax for prophylaxis 4. Mood: Provide emotional support  5. Neuropsych: This patient is capable of making decisions on her own behalf.  6. Skin/Wound Care: Routine skin checks  7. Fluids/Electrolytes/Nutrition: encourage PO    -I personally reviewed the patient's labs today.  All wnl except for blood glucose 8.Dysphagia. Dysphagia #1 nectar liquids. Follow-up speech therapy for advancement 9. Tobacco abuse. Provide counseling  10. Hypertension. Permissive hypertension. Monitor with increased mobility    -bp reasonable today 11. DM2: hgb A1c 6.7. Blood glucose consistently elevated on labs   -check CBG's ac and hs   -  consider ssi   -change to cm diet pending readings    LOS (Days) 1 A FACE TO FACE EVALUATION WAS PERFORMED  Ranelle Oyster, MD 12/20/2017 6:10 PM

## 2017-12-20 NOTE — Progress Notes (Signed)
Heather Mage, RN  Rehab Admission Coordinator  Physical Medicine and Rehabilitation  PMR Pre-admission  Signed  Date of Service:  12/19/2017 2:18 PM       Related encounter: ED to Hosp-Admission (Discharged) from 12/17/2017 in [facility] NEURO/TRAUMA/SURGICAL ICU      Signed            [] Hide copied text  [] Hover for details   PMR Admission Coordinator Pre-Admission Assessment  Patient: Heather [name] is an 62 y.o., female MRN: [id number] DOB: [birth year] Height: 5\' 8"  (172.7 cm) Weight: 120.3 kg (265 lb 3.4 oz)                                                                                                                                            Insurance Information Self pay - no insurance  Medicaid Application Date:        Case Manager:   Disability Application Date:        Case Worker:    Emergency Conservator, museum/gallery Information    Name Relation Home Work Mobile   Polanco,Elaine Niece [phone]       Current Medical History  Patient Admitting Diagnosis:  R MCA infarct  History of Present Illness: A 62 y.o.right handed Spanish-speakingfemalewith history of hypertension and  tobacco abuse. On no scheduled medications. Patient lives with grandaughter.  Independent prior to admission. Grandaughter works during the day.Presented 12/17/2016 with left-sided weakness/headache and dysarthria.Blood pressure 157/112.CT of the head showed a right MCA territory infarct. No associated hemorrhage. Mild regional mass effect with no midline shift.Patient did not receive TPA. CT angiogram head and neck showed abrupt proximal right M2 occlusion/ near occlusion.Underwent mechanical thrombectomy per interventional radiology.Echocardiogram is pending. Currently maintained on aspirin as well as Brilinta for CVA prophylaxis. Close monitoring of blood pressure. Physical and occupational therapy evaluations are pending. M.D. Has requested physical medicine  rehabilitation consult.  Total: 9=NIH  Past Medical History      Past Medical History:  Diagnosis Date  . Hypertension     Family History  family history includes Diabetes in her mother; Heart disease in her father; Pulmonary disease in her mother.  Prior Rehab/Hospitalizations: No previous rehab  Has the patient had major surgery during 100 days prior to admission? No  Current Medications   Current Facility-Administered Medications:  .  0.9 %  sodium chloride infusion, , Intravenous, Continuous, Heather [name], Sanjeev, Heather [name], Last Rate: 75 mL/hr at 12/19/17 1011 .  acetaminophen (TYLENOL) tablet 650 mg, 650 mg, Oral, Q4H PRN, 650 mg at 12/18/17 1945 **OR** acetaminophen (TYLENOL) solution 650 mg, 650 mg, Per Tube, Q4H PRN **OR** acetaminophen (TYLENOL) suppository 650 mg, 650 mg, Rectal, Q4H PRN, Heather [name], Sanjeev, Heather [name] .  acetaminophen-codeine (TYLENOL #3) 300-30 MG per tablet 1-2 tablet, 1-2 tablet, Oral, Q6H PRN, Heather Riley, Heather [name], 1 tablet at 12/19/17 1139 .  aspirin chewable tablet 81 mg,  81 mg, Oral, Daily, 81 mg at 12/19/17 1029 **OR** aspirin chewable tablet 81 mg, 81 mg, Per Tube, Daily, Heather [name], Sanjeev, Heather [name] .  Melene Heather [name] ON 12/20/2017] Influenza vac split quadrivalent PF (FLUARIX) injection 0.5 mL, 0.5 mL, Intramuscular, Tomorrow-1000, Heather [name], Heather [name], Heather [name] .  ondansetron (ZOFRAN) injection 4 mg, 4 mg, Intravenous, Q8H PRN, Heather [name], Heather [name], Heather [name] .  RESOURCE THICKENUP CLEAR, , Oral, PRN, Heather Riley, Heather [name] .  senna-docusate (Senokot-[name]) tablet 1 tablet, 1 tablet, Oral, QHS PRN, Heather [name], Heather Lords, Heather [name] .  ticagrelor (BRILINTA) tablet 90 mg, 90 mg, Oral, BID, 90 mg at 12/19/17 1029 **OR** ticagrelor (BRILINTA) tablet 90 mg, 90 mg, Per Tube, BID, Heather Cotton, Heather [name]  Patients Current Diet: Fall precautions DIET - DYS 1 Room service appropriate? Yes; Fluid consistency: Nectar Thick  Precautions / Restrictions Precautions Precautions: Fall Restrictions Weight Bearing  Restrictions: No   Has the patient had 2 or more falls or a fall with injury in the past year?Yes.  Daughter reports at least 6 falls this past year.  Patient drinks heavily on the weekends.  Prior Activity Level Limited Community (1-2x/wk): Went out 2-3 X a week with grand children.  Did not drive.  Home Assistive Devices / Equipment Home Assistive Devices/Equipment: None Home Equipment: None  Prior Device Use: Indicate devices/aids used by the patient prior to current illness, exacerbation or injury? None  Prior Functional Level Prior Function Level of Independence: Independent Comments: ADLs and IADLs. Does not drive. 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  Independent  Indoor Mobility: Did the patient need assistance with walking from room to room (with or without device)? Independent  Stairs: Did the patient need assistance with internal or external stairs (with or without device)? Independent  Functional Cognition: Did the patient need help planning regular tasks such as shopping or remembering to take medications? Independent  Current Functional Level Cognition  Overall Cognitive Status: Impaired/Different from baseline Current Attention Level: Sustained Orientation Level: Oriented X4 Following Commands: Follows one step commands with increased time, Follows one step commands inconsistently Safety/Judgement: Decreased awareness of deficits General Comments: Pt stating it is "quarter to one" and the time was 1038. With min VCs she states "twenty till 73," which is the correct time. Pt requiring increased cues to follow simple commands and requiring increased cues for visual testing and pt fatigues quickly.     Extremity Assessment (includes Sensation/Coordination)  Upper Extremity Assessment: LUE deficits/detail LUE Deficits / Details: Pt with decreased strength  (distally<proximally). Very poor grasp strength and no finger opposition. Pt able to perform shoulder and elbow flexion against gravity. Pt able to bring LUE to nose and top of head LUE Coordination: decreased fine motor, decreased gross motor  Lower Extremity Assessment: Defer to PT evaluation LLE Deficits / Details: 3/5 hip flexion, knee flexion and extension. pt reports equal sensation    ADLs  Overall ADL'[name] : Needs assistance/impaired Eating/Feeding: Minimal assistance, Sitting Grooming: Minimal assistance, Sitting, Brushing hair Upper Body Bathing: Minimal assistance, Sitting Lower Body Bathing: Moderate assistance, Sit to/from stand Upper Body Dressing : Minimal assistance, Sitting Lower Body Dressing: Moderate assistance, Sit to/from stand Toilet Transfer: Minimal assistance, Ambulation(Simulated to recliner) Functional mobility during ADLs: Minimal assistance General ADL Comments: Pt with decreased functional performance and is impacted by poor functional use of LUE, poor balance, vision deficits, and decreased cognition    Mobility  Overal bed mobility: Needs Assistance  Bed Mobility: Supine to Sit Supine to sit: Min assist General bed mobility comments: assist for lines with pt using momentum to elevate trunk with RUE assist to fully raise trunk from surface and increased time, cues to scoot to EOB    Transfers  Overall transfer level: Needs assistance Transfers: Sit to/from Stand Sit to Stand: Min guard General transfer comment: guarding for balance to stand, decreased contol of descent with close guarding to sit to prevent fall    Ambulation / Gait / Stairs / Wheelchair Mobility  Ambulation/Gait Ambulation/Gait assistance: Hydrographic surveyor (Feet): 100 Feet Assistive device: None Gait Pattern/deviations: Step-through pattern, Decreased stride length General Gait Details: pt with decreased speed and stability with gait with ability to walk short  distance slowly and cautiously without UE assist, cues for direction and room Gait velocity interpretation: Below normal speed for age/gender    Posture / Balance Balance Overall balance assessment: Needs assistance Sitting-balance support: No upper extremity supported, Feet supported Sitting balance-Leahy Scale: Fair Standing balance-Leahy Scale: Good    Special needs/care consideration BiPAP/CPAP No CPM No Continuous Drip IV 0.9% NS 75 mL/hr Dialysis No      Life Vest No Oxygen No Special Bed No Trach Size No Wound Vac (area) No      Skin No                           Bowel mgmt: No documented BM since admission 12/17/17 Bladder mgmt: Voiding up with assistance Diabetic mgmt No    Previous Home Environment Living Arrangements: Other relatives(granddaughter) Available Help at Discharge: Family, Available 24 hours/day Type of Home: Apartment Home Layout: One level Home Access: Stairs to enter Secretary/administrator of Steps: 12 Bathroom Shower/Tub: Engineer, manufacturing systems: Standard Home Care Services: No  Discharge Living Setting Plans for Discharge Living Setting: Lives with (comment), Apartment(Lives with grand daughter.) Type of Home at Discharge: Apartment(2nd floor apartment.) Discharge Home Layout: One level(2nd level apartment.) Discharge Home Access: Stairs to enter Entrance Stairs-Number of Steps: 10-12 steps Does the patient have any problems obtaining your medications?: Yes (Describe)(Has no insurance to pay for medications.)  Social/Family/Support Systems Patient Roles: Other (Comment)(Has a daughter and 5 grand children.) Contact Information: Dyanne Carrel - niece - [phone] Anticipated Caregiver: Hayes Ludwig and daughter Ability/Limitations of Caregiver: Daughter and grand daughter work.  Grandson can stay with patient while family works. Caregiver Availability: 24/7 Discharge Plan Discussed with Primary Caregiver: Yes Is Caregiver In  Agreement with Plan?: Yes Does Caregiver/Family have Issues with Lodging/Transportation while Pt is in Rehab?: No  Goals/Additional Needs Patient/Family Goal for Rehab: PT supervision, OT supervision to min assist, SLP supervision goals Expected length of stay: 7-10 days Cultural Considerations: Speaks Spanish, will need interpreter, is Catholic Dietary Needs: Dys 1, nectar thick liquids Equipment Needs: TBD Pt/Family Agrees to Admission and willing to participate: Yes Program Orientation Provided & Reviewed with Pt/Caregiver Including Roles  & Responsibilities: Yes  Decrease burden of Care through IP rehab admission: N/A  Possible need for SNF placement upon discharge: Not anticipated  Patient Condition: This patient'[name] condition remains as documented in the consult dated 12/19/17, in which the Rehabilitation Physician determined and documented that the patient'[name] condition is appropriate for intensive rehabilitative care in an inpatient rehabilitation facility. Will admit to inpatient rehab today.  Preadmission Screen Completed By:  Heather [name], 12/19/2017 2:24 PM ______________________________________________________________________   Discussed status with Dr. Riley Kill on 12/19/17 at 1424 and received telephone  approval for admission today.  Admission Coordinator:  Heather [name], time 1424/Date 12/19/17             Cosigned by: Ranelle Oyster, Heather [name] at 12/19/2017 2:59 PM  Revision History

## 2017-12-21 ENCOUNTER — Inpatient Hospital Stay (HOSPITAL_COMMUNITY): Payer: Self-pay

## 2017-12-21 ENCOUNTER — Inpatient Hospital Stay (HOSPITAL_COMMUNITY): Payer: Self-pay | Admitting: Physical Therapy

## 2017-12-21 ENCOUNTER — Encounter (HOSPITAL_COMMUNITY): Payer: Self-pay | Admitting: Speech Pathology

## 2017-12-21 ENCOUNTER — Ambulatory Visit (HOSPITAL_COMMUNITY): Payer: Self-pay | Admitting: Speech Pathology

## 2017-12-21 ENCOUNTER — Inpatient Hospital Stay (HOSPITAL_COMMUNITY): Payer: Self-pay | Admitting: Speech Pathology

## 2017-12-21 LAB — GLUCOSE, CAPILLARY
Glucose-Capillary: 156 mg/dL — ABNORMAL HIGH (ref 65–99)
Glucose-Capillary: 148 mg/dL — ABNORMAL HIGH (ref 65–99)
Glucose-Capillary: 116 mg/dL — ABNORMAL HIGH (ref 65–99)
Glucose-Capillary: 141 mg/dL — ABNORMAL HIGH (ref 65–99)

## 2017-12-21 NOTE — Progress Notes (Signed)
Speech Language Pathology Daily Session Note  Patient Details  Name: Heather [name] MRN: [id number] Date of Birth: 06-04-1956  Today's Date: 12/21/2017   Skilled treatment session #2 SLP Individual Time: 8366-2947 SLP Individual Time Calculation (min): 8 min   Skilled treatment Session #3 SLP Individual Time: 1130-1200 SLP Individual Time Calculation (min): 30 min  Short Term Goals: Week 1: SLP Short Term Goal 1 (Week 1): Pt will demonstrate selective attention in moderately distracting environment for ~ 45 minutes with supervision cues.  SLP Short Term Goal 2 (Week 1): Pt will demonstrate intellectual awareness by listing 3 physical and 2 cognitive/dysphagia deficits related to acute CVa with supervision cues.  SLP Short Term Goal 3 (Week 1): Pt will scan to left of environment to locate items in 90% of opportunities with supervision cues.  SLP Short Term Goal 4 (Week 1): Pt will consume least restrictive diet with minimal overt s/s of aspiration with supervision cues for use of compensatory swallow strategies.  SLP Short Term Goal 5 (Week 1): Pt will complete semi-complex problem solving tasks with supervision cues.   Skilled Therapeutic Interventions:  Skilled treatment session #2 focused on educating daughter and pt on swallow function and diet recommendation and POC of trial tray at lunch.   Skilled treatment session #3 focused on dysphagia and cognition goals. SLP facilitated session by providing skilled observation of pt consuming dysphagia 3 lunch tray with thin liquids via straw. Pt with moderate left buccal pocketing and anterior spillage on left. Despite Max A cues pt was not able to use lingual sweep effectively or place food boluses on her right. Therefore recommend upgrading diet to dysphagia 2 with thin liquids. SLP further facilitated session by providing Min A cues to answer yes/no questions related to intellectual awareness. Pt left in daughter's presence. Daughter provides  that thye have arranged 24 hour supervision for pt.      Function:  Eating Eating   Modified Consistency Diet: No Eating Assist Level: More than reasonable amount of time           Cognition Comprehension Comprehension assist level: Follows basic conversation/direction with no assist  Expression   Expression assist level: Expresses basic needs/ideas: With extra time/assistive device;Expresses basic needs/ideas: With no assist  Social Interaction Social Interaction assist level: Interacts appropriately with others with medication or extra time (anti-anxiety, antidepressant).  Problem Solving Problem solving assist level: Solves basic problems with no assist  Memory Memory assist level: Recognizes or recalls 90% of the time/requires cueing < 10% of the time    Pain    Therapy/Group: Individual Therapy  Kavi Almquist 12/21/2017, 3:13 PM

## 2017-12-21 NOTE — Progress Notes (Signed)
[facility] PHYSICAL MEDICINE & REHABILITATION     PROGRESS NOTE    Subjective/Complaints: Slept better. Still having headaches. Denied pain this morning.   ROS: limited due to language/communication   Objective: Vital Signs: Blood pressure (!) 145/86, pulse 61, temperature 98.4 F (36.9 C), temperature source Oral, resp. rate 18, height 5\' 8"  (1.727 m), weight 119.3 kg (263 lb 0.1 oz), SpO2 99 %. Mr Brain Wo Contrast  Result Date: 12/19/2017 CLINICAL DATA:  Left-sided weakness, headache, and dysarthria. Right MCA infarct. Right M2 occlusion status post revascularization. EXAM: MRI HEAD WITHOUT CONTRAST TECHNIQUE: Multiplanar, multiecho pulse sequences of the brain and surrounding structures were obtained without intravenous contrast. COMPARISON:  Head CT 12/18/2017 FINDINGS: Brain: There is a large acute right MCA territory infarct involving the insula, temporal stem, frontal lobe, and parietal lobe. Susceptibility weighted imaging is severely motion degraded, however there is evidence of confluent petechial type hemorrhage throughout much of the infarct. No malignant hemorrhagic transformation is identified. There is associated cytotoxic edema with regional sulcal effacement and mild mass effect on the right basal ganglia and lateral ventricle with only trace leftward midline shift. A punctate acute infarct is noted peripherally in the right cerebellar hemisphere. Scattered periventricular and subcortical white matter T2 hyperintensities bilaterally are nonspecific but compatible with mild chronic small vessel ischemic disease. Cerebral volume is normal for age. Vascular: Major intracranial vascular flow voids are preserved. Susceptibility artifact is noted from the right MCA stent. Skull and upper cervical spine: Unremarkable bone marrow signal. Sinuses/Orbits: Unremarkable orbits. Mild right sphenoid sinus mucosal thickening. Clear mastoid air cells. Other: None. IMPRESSION: 1. Large acute right  MCA infarct with petechial hemorrhage. Mild regional mass effect with trace midline shift. 2. Punctate acute right cerebellar infarct. 3. Mild chronic small vessel ischemic disease. Electronically Signed   By: 12/20/2017 M.D.   On: 12/19/2017 16:20   Recent Labs    12/19/17 0513 12/20/17 0738  WBC 10.9* 10.5  HGB 12.3 13.4  HCT 37.8 41.3  PLT 237 250   Recent Labs    12/19/17 0513 12/20/17 0738  NA 138 137  K 3.5 4.0  CL 104 103  GLUCOSE 184* 209*  BUN <5* 7  CREATININE 0.39* 0.44  CALCIUM 8.6* 9.0   CBG (last 3)  Recent Labs    12/20/17 2104 12/21/17 0656  GLUCAP 161* 156*    Wt Readings from Last 3 Encounters:  12/19/17 119.3 kg (263 lb 0.1 oz)  12/17/17 120.3 kg (265 lb 3.4 oz)  07/27/17 117.1 kg (258 lb 3.2 oz)    Physical Exam:  Constitutional: No distress . Vital signs reviewed. HEENT: EOMI, oral membranes moist Cardiovascular: RRR without murmur. No JVD    Respiratory: CTA Bilaterally without wheezes or rales. Normal effort    GI: BS +, non-tender, non-distended   Musculoskeletal: She exhibits no edema or deformity.  Skin. Warm and dry  Neurological. Alert. Right gaze preference. . Left central 7 and tongue deviation. +left inattention---persistent LUE grossly 2 to 2+/5 prox to distal.  LLE: 3+/5 prox to distal with some difficulties activitating on command.  Pain sense dimished left arm to LT. RUE and RLE grossly 4+ to 5/5 with normal sensory function. No resting tone.  Psych: pleasant and cooperative    Assessment/Plan: 1. Functional deficits secondary to right MCA infarct which require 3+ hours per day of interdisciplinary therapy in a comprehensive inpatient rehab setting. Physiatrist is providing close team supervision and 24 hour management of active medical problems listed  below. Physiatrist and rehab team continue to assess barriers to discharge/monitor patient progress toward functional and medical goals.  Function:  Bathing Bathing position    Position: Shower  Bathing parts Body parts bathed by patient: Left arm, Chest, Abdomen, Front perineal area, Right upper leg, Left upper leg Body parts bathed by helper: Buttocks, Right lower leg, Left lower leg, Back, Right arm  Bathing assist Assist Level: Touching or steadying assistance(Pt > 75%)      Upper Body Dressing/Undressing Upper body dressing   What is the patient wearing?: Hospital gown                Upper body assist        Lower Body Dressing/Undressing Lower body dressing   What is the patient wearing?: Hospital Gown, Non-skid slipper socks           Non-skid slipper socks- Performed by helper: Don/doff right sock, Don/doff left sock                  Lower body assist Assist for lower body dressing: Touching or steadying assistance (Pt > 75%)      Toileting Toileting   Toileting steps completed by patient: Adjust clothing prior to toileting, Performs perineal hygiene, Adjust clothing after toileting Toileting steps completed by helper: Adjust clothing after toileting Toileting Assistive Devices: Grab bar or rail  Toileting assist Assist level: Touching or steadying assistance (Pt.75%)   Transfers Chair/bed transfer   Chair/bed transfer method: Ambulatory, Stand pivot Chair/bed transfer assist level: Touching or steadying assistance (Pt > 75%)       Locomotion Ambulation     Max distance: 300 Assist level: Touching or steadying assistance (Pt > 75%)   Wheelchair          Cognition Comprehension Comprehension assist level: Follows basic conversation/direction with no assist  Expression Expression assist level: Expresses complex 90% of the time/cues < 10% of the time  Social Interaction Social Interaction assist level: Interacts appropriately with others with medication or extra time (anti-anxiety, antidepressant).  Problem Solving Problem solving assist level: Solves complex 90% of the time/cues < 10% of the time  Memory Memory  assist level: Recognizes or recalls 90% of the time/requires cueing < 10% of the time   Medical Problem List and Plan:  1. 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 DVT Prophylaxis/Anticoagulation: Brilinta  3. Pain Management: Tylenol 3 for headaches as needed    -if persistent can consider topamax for prophylaxis 4. Mood: Provide emotional support  5. Neuropsych: This patient is capable of making decisions on her own behalf.  6. Skin/Wound Care: Routine skin checks  7. Fluids/Electrolytes/Nutrition: encourage PO    -all labs wnl except for blood glucose 8.Dysphagia. Dysphagia #1 nectar liquids. Adv therapy as tolerated 9. Tobacco abuse. Provide counseling  10. Hypertension. Permissive hypertension. Monitor with increased mobility    -bp controlled 3/21 11. DM2: hgb A1c 6.7. Blood glucose consistently elevated on labs   -check CBG's ac and hs   -consider ssi   -change to cm diet    LOS (Days) 2 A FACE TO FACE EVALUATION WAS PERFORMED  Ranelle Oyster, MD 12/21/2017 8:42 AM

## 2017-12-21 NOTE — Progress Notes (Signed)
Physical Therapy Session Note  Patient Details  Name: Heather [name] MRN: [id number] Date of Birth: 02/21/1956  Today's Date: 12/21/2017 PT Individual Time: 1300-1415 PT Individual Time Calculation (min): 75 min   Short Term Goals: Week 1:  PT Short Term Goal 1 (Week 1): =LTGS  Skilled Therapeutic Interventions/Progress Updates:    c/o 7/10 HA pain but reports being premedicated and agreeable to therapy.  Session focus on formal balance assessment, standing balance, and UE strengthening/NMR.   Pt transitions to EOB with min assist to pull up.  Sit<>stand throughout session with close supervision.  Pt able to ambulate throughout unit, max distance 150', with close supervision, and occasional min guard during turning.  PT administered BERG Balance Scale with patient demonstrating significant fall risk as noted by score of 38/56 on Berg Balance Scale.  Discussed results with pt and made recommendations for continued supervision/min assist with mobility at this time with focus on increasing score on BERG during LOS.    Pt completes alternating toe taps to 4" step for weight shifting and short term SLS 2 trials to fatigue with min guard progressing to close supervision with practice.  Following first trial, pt reports feeling a tightness in her chest and feeling as if she were suffocating.  Vitals assessed and BP 143/99, HR 71, O2 97%, Rn alerted and in to assess pt.  Following RN intervention and rest break, BP 134/88, O2 98%, and HR 71.  Pt able to complete second trial of toe taps without discomfort.  Pt also completes 2x12 reps shoulder press with 1kg weighted ball focus on LUE use. Standing balance on foam x30 seconds focus on ankle strategy, progress to completing 10 reps mini squats with min assist for balance.  Pt returned to room at end of session, toileted with supervision for 3/3 steps.  Returned to recliner and positioned with call bell in reach and needs met.   Therapy  Documentation Precautions:  Precautions Precautions: Fall Precaution Comments: L inattention Restrictions Weight Bearing Restrictions: No Balance: Standardized Balance Assessment Standardized Balance Assessment: Berg Balance Test Berg Balance Test Sit to Stand: Able to stand without using hands and stabilize independently Standing Unsupported: Able to stand safely 2 minutes Sitting with Back Unsupported but Feet Supported on Floor or Stool: Able to sit safely and securely 2 minutes Stand to Sit: Controls descent by using hands Transfers: Able to transfer safely, definite need of hands Standing Unsupported with Eyes Closed: Able to stand 10 seconds with supervision Standing Ubsupported with Feet Together: Able to place feet together independently and stand for 1 minute with supervision From Standing, Reach Forward with Outstretched Arm: Reaches forward but needs supervision From Standing Position, Pick up Object from Floor: Able to pick up shoe, needs supervision From Standing Position, Turn to Look Behind Over each Shoulder: Looks behind one side only/other side shows less weight shift Turn 360 Degrees: Able to turn 360 degrees safely one side only in 4 seconds or less Standing Unsupported, Alternately Place Feet on Step/Stool: Able to complete >2 steps/needs minimal assist Standing Unsupported, One Foot in Front: Able to take small step independently and hold 30 seconds Standing on One Leg: Tries to lift leg/unable to hold 3 seconds but remains standing independently Total Score: 38  See Function Navigator for Current Functional Status.   Therapy/Group: Individual Therapy  Michel Santee 12/21/2017, 1:31 PM

## 2017-12-21 NOTE — Progress Notes (Signed)
Modified Barium Swallow Progress Note  Patient Details  Name: Heather [name] MRN: [id number] Date of Birth: 23-Feb-1956  Today's Date: 12/21/2017  Modified Barium Swallow completed.  Full report located under Chart Review in the Imaging Section.  Brief recommendations include the following:  Clinical Impression  Pt with moderate oral phase and mild pharyngeal phase dysphagia. Pt's oral phase is c/b significant left anterior spillage with liquids and food. Pt also demonstrates accumulating left buccal residue with dysphagia 2 and 3 textures. Pt's pharyngeal phase is c/b delayed swallow initiation as bolus transitions from vallecula to pyriform sinuses which results in consistent flash penetration that clears during swallow. With multiple consecutive sips, pt with minimal deeper penetration that was sensed with cough which cleared penetrates. Recommend upgrade to dysphagia 2 with thin liquids via cup or straw, medicine whole with liquids and full supervision to cue left buccal clearing and to encourage slow rate/small sips.    Swallow Evaluation Recommendations       SLP Diet Recommendations: Dysphagia 2 (Fine chop) solids;Thin liquid   Liquid Administration via: Cup;Straw   Medication Administration: Whole meds with liquid   Supervision: Patient able to self feed;Full supervision/cueing for compensatory strategies   Compensations: Minimize environmental distractions;Slow rate;Small sips/bites;Lingual sweep for clearance of pocketing;Monitor for anterior loss   Postural Changes: Seated upright at 90 degrees   Oral Care Recommendations: Oral care BID        Win Guajardo 12/21/2017,3:11 PM

## 2017-12-21 NOTE — Progress Notes (Signed)
[facility] Individual Statement of Services  Patient Name:  Heather [name]  Date:  12/21/2017  Welcome to the [facility].  Our goal is to provide you with an individualized program based on your diagnosis and situation, designed to meet your specific needs.  With this comprehensive rehabilitation program, you will be expected to participate in at least 3 hours of rehabilitation therapies Monday-Friday, with modified therapy programming on the weekends.  Your rehabilitation program will include the following services:  Physical Therapy (PT), Occupational Therapy (OT), Speech Therapy (ST), 24 hour per day rehabilitation nursing, Case Management (Social Worker), Rehabilitation Medicine, Nutrition Services and Pharmacy Services  Weekly team conferences will be held on Tuesdays to discuss your progress.  Your Social Worker will talk with you frequently to get your input and to update you on team discussions.  Team conferences with you and your family in attendance may also be held.  Expected length of stay:  7 to 10 days  Overall anticipated outcome:  Supervision  Depending on your progress and recovery, your program may change. Your Social Worker will coordinate services and will keep you informed of any changes. Your Social Worker's name and contact numbers are listed  below.  The following services may also be recommended but are not provided by the [facility]patient [facility]:   Driving Evaluations  Home Health Rehabiltiation Services  Outpatient Rehabilitation Services   Arrangements will be made to provide these services after discharge if needed.  Arrangements include referral to agencies that provide these services.  Your insurance has been verified to be:  Self pay Your primary doctor is:  Arrie Senate, FNP  Pertinent information will be shared with your doctor and your insurance company.  Social Worker:  Staci Acosta, LCSW  [phone] or [phone]  Information discussed with and copy given to patient by: Elvera Lennox, 12/21/2017, 3:54 PM

## 2017-12-21 NOTE — Progress Notes (Signed)
Occupational Therapy Session Note  Patient Details  Name: Heather [name] MRN: [id number] Date of Birth: 06-15-1956  Today's Date: 12/21/2017 OT Individual Time: 1030-1130 OT Individual Time Calculation (min): 60 min    Short Term Goals: Week 1:  OT Short Term Goal 1 (Week 1): LTG=STG  Skilled Therapeutic Interventions/Progress Updates:    OT intervention with focus on BADL retraining including bathing at shower level and dressing with sit<>stand from EOB.  Pt amb with HHA to bathroom and completed bathing tasks with steady A/min A.  Pt initiates use of LUE/hand to assist with bathing tasks but consistently was unable to grasp wash cloth adequately.  Pt completed dressing tasks with assistance for donning L sock.  Pt c/o increased nausea and dizziness and returned to bed at end of session.  Pt remained in bed with bed alarm activated with daughter and interpreter present.    Therapy Documentation Precautions:  Precautions Precautions: Fall Precaution Comments: L inattention Restrictions Weight Bearing Restrictions: No  Pain: Pain Assessment Pain Score: 6 increasing to 8 during session (with nausea and dizziness); RN aware  See Function Navigator for Current Functional Status.   Therapy/Group: Individual Therapy  Rich Brave 12/21/2017, 12:21 PM

## 2017-12-21 NOTE — Progress Notes (Signed)
After lunch, pt complains of upper epigastric pain. Mylanta administration gave relief. Education provided by Heather Blonder, RN to sit up after eating. Protonix (?)

## 2017-12-22 ENCOUNTER — Inpatient Hospital Stay (HOSPITAL_COMMUNITY): Payer: Self-pay

## 2017-12-22 ENCOUNTER — Ambulatory Visit (HOSPITAL_COMMUNITY): Payer: Self-pay | Admitting: Speech Pathology

## 2017-12-22 ENCOUNTER — Inpatient Hospital Stay (HOSPITAL_COMMUNITY): Payer: Self-pay | Admitting: Physical Therapy

## 2017-12-22 LAB — GLUCOSE, CAPILLARY
Glucose-Capillary: 151 mg/dL — ABNORMAL HIGH (ref 65–99)
Glucose-Capillary: 159 mg/dL — ABNORMAL HIGH (ref 65–99)
Glucose-Capillary: 100 mg/dL — ABNORMAL HIGH (ref 65–99)
Glucose-Capillary: 189 mg/dL — ABNORMAL HIGH (ref 65–99)

## 2017-12-22 MED ORDER — CARBAMIDE PEROXIDE 6.5 % OT SOLN
5.0000 [drp] | Freq: Two times a day (BID) | OTIC | Status: AC
  Administered 2017-12-22 – 2017-12-25 (×8): 5 [drp] via OTIC
  Filled 2017-12-22: qty 15

## 2017-12-22 NOTE — Plan of Care (Signed)
  Problem: RH PAIN MANAGEMENT Goal: RH STG PAIN MANAGED AT OR BELOW PT'S PAIN GOAL Description At or below level 4  Outcome: Progressing  Continue to have pain, administered prn pain regimen
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
## 2017-12-22 NOTE — Progress Notes (Signed)
[facility]     PROGRESS NOTE    Subjective/Complaints: Pt feeling fairly well. Has mild headache. Notes that her right ear feels full, clogged. Having slight nausea this morning.   ROS: limited due to language/communication   Objective: Vital Signs: Blood pressure 104/77, pulse 61, temperature 98.3 F (36.8 C), temperature source Oral, resp. rate 19, height 5\' 8"  (1.727 m), weight 119.3 kg (263 lb 0.1 oz), SpO2 95 %. Dg Swallowing Func-speech Pathology  Result Date: 12/21/2017 Objective Swallowing Evaluation: Type of Study: Bedside Swallow Evaluation  Patient Details Name: Heather [name] MRN: [id number] Patricia Date of Birth: April 28, 1956 Today's Date: 12/21/2017 Time: SLP Start Time (ACUTE ONLY): 1000 -SLP Stop Time (ACUTE ONLY): 1015 SLP Time Calculation (min) (ACUTE ONLY): 15 min Past Medical History: Past Medical History: Diagnosis Date . Hypertension  Past Surgical History: Past Surgical History: Procedure Laterality Date . BREAST EXCISIONAL BIOPSY   . BREAST SURGERY    right breast . CHOLECYSTECTOMY   . IR CT HEAD LTD  12/18/2017 . IR INTRA CRAN STENT  12/18/2017 . IR PERCUTANEOUS ART THROMBECTOMY/INFUSION INTRACRANIAL INC DIAG ANGIO  12/18/2017 . RADIOLOGY WITH ANESTHESIA N/A 12/18/2017  Procedure: RADIOLOGY WITH ANESTHESIA;  Surgeon: 12/20/2017, MD;  Location: [facility] OR;  Service: Radiology;  Laterality: N/A; HPI: [name] with PMH of HTN,Obesity who presents 62 emergency room as a stroke alert with left hemiparesis. Initial CT without acute infarct, tiny remote right basal ganglia infarct. s/p rt common carotid arteriogram 3/18 with revascularization/stenting of right MCA stenosis. 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  No data recorded Assessment / Plan / Recommendation CHL IP CLINICAL IMPRESSIONS 12/21/2017 Clinical Impression Pt with moderate oral phase and mild pharyngeal phase dysphagia. Pt's oral phase is c/b significant left anterior spillage with  liquids and food. Pt also demonstrates accumulating left buccal residue with dysphagia 2 and 3 textures. Pt's pharyngeal phase is c/b delayed swallow initiation as bolus transitions from vallecula to pyriform sinuses which results in consistent flash penetration that clears during swallow. With multiple consecutive sips, pt with minimal deeper penetration that was sensed with cough which cleared penetrates. Recommend upgrade to dysphagia 2 with thin liquids via cup or straw, medicine whole with liquids and full supervision to cue left buccal clearing and to encourage slow rate/small sips.  SLP Visit Diagnosis Dysphagia, oropharyngeal phase (R13.12) Attention and concentration deficit following -- Frontal lobe and executive function deficit following -- Impact on safety and function Mild aspiration risk;Moderate aspiration risk   CHL IP TREATMENT RECOMMENDATION 12/21/2017 Treatment Recommendations Therapy as outlined in treatment plan below   Prognosis 12/18/2017 Prognosis for Safe Diet Advancement Good Barriers to Reach Goals -- Barriers/Prognosis Comment -- CHL IP DIET RECOMMENDATION 12/21/2017 SLP Diet Recommendations Dysphagia 2 (Fine chop) solids;Thin liquid Liquid Administration via Cup;Straw Medication Administration Whole meds with liquid Compensations Minimize environmental distractions;Slow rate;Small sips/bites;Lingual sweep for clearance of pocketing;Monitor for anterior loss Postural Changes Seated upright at 90 degrees   CHL IP OTHER RECOMMENDATIONS 12/21/2017 Recommended Consults -- Oral Care Recommendations Oral care BID Other Recommendations --   CHL IP FOLLOW UP RECOMMENDATIONS 12/19/2017 Follow up Recommendations Inpatient Rehab   CHL IP FREQUENCY AND DURATION 12/18/2017 Speech Therapy Frequency (ACUTE ONLY) min 2x/week Treatment Duration 2 weeks      CHL IP ORAL PHASE 12/21/2017 Oral Phase WFL Oral - Pudding Teaspoon -- Oral - Pudding Cup -- Oral - Honey Teaspoon -- Oral - Honey Cup -- Oral - Nectar  Teaspoon -- Oral -  Nectar Cup -- Oral - Nectar Straw -- Oral - Thin Teaspoon -- Oral - Thin Cup -- Oral - Thin Straw -- Oral - Puree -- Oral - Mech Soft -- Oral - Regular -- Oral - Multi-Consistency -- Oral - Pill -- Oral Phase - Comment --  CHL IP PHARYNGEAL PHASE 12/21/2017 Pharyngeal Phase Impaired Pharyngeal- Pudding Teaspoon -- Pharyngeal -- Pharyngeal- Pudding Cup -- Pharyngeal -- Pharyngeal- Honey Teaspoon -- Pharyngeal -- Pharyngeal- Honey Cup -- Pharyngeal -- Pharyngeal- Nectar Teaspoon -- Pharyngeal -- Pharyngeal- Nectar Cup -- Pharyngeal -- Pharyngeal- Nectar Straw -- Pharyngeal -- Pharyngeal- Thin Teaspoon Delayed swallow initiation-vallecula;Delayed swallow initiation-pyriform sinuses;Penetration/Aspiration before swallow Pharyngeal Material enters airway, remains ABOVE vocal cords then ejected out Pharyngeal- Thin Cup Delayed swallow initiation-vallecula;Delayed swallow initiation-pyriform sinuses;Penetration/Aspiration before swallow Pharyngeal Material enters airway, remains ABOVE vocal cords then ejected out Pharyngeal- Thin Straw Delayed swallow initiation-vallecula;Delayed swallow initiation-pyriform sinuses;Penetration/Aspiration before swallow Pharyngeal Material enters airway, remains ABOVE vocal cords then ejected out Pharyngeal- Puree -- Pharyngeal -- Pharyngeal- Mechanical Soft Delayed swallow initiation-vallecula;Delayed swallow initiation-pyriform sinuses;Penetration/Aspiration before swallow Pharyngeal Material enters airway, remains ABOVE vocal cords then ejected out Pharyngeal- Regular Delayed swallow initiation-vallecula;Delayed swallow initiation-pyriform sinuses Pharyngeal -- Pharyngeal- Multi-consistency Delayed swallow initiation-vallecula;Delayed swallow initiation-pyriform sinuses;Penetration/Aspiration before swallow Pharyngeal Material enters airway, remains ABOVE vocal cords then ejected out Pharyngeal- Pill Delayed swallow initiation-vallecula;Delayed swallow  initiation-pyriform sinuses;Penetration/Aspiration before swallow Pharyngeal Material enters airway, remains ABOVE vocal cords then ejected out Pharyngeal Comment --  CHL IP CERVICAL ESOPHAGEAL PHASE 12/21/2017 Cervical Esophageal Phase WFL Pudding Teaspoon -- Pudding Cup -- Honey Teaspoon -- Honey Cup -- Nectar Teaspoon -- Nectar Cup -- Nectar Straw -- Thin Teaspoon -- Thin Cup -- Thin Straw -- Puree -- Mechanical Soft -- Regular -- Multi-consistency -- Pill -- Cervical Esophageal Comment -- No flowsheet data found. Heather [name] 12/21/2017, 3:39 PM              Recent Labs    12/20/17 0738  WBC 10.5  HGB 13.4  HCT 41.3  PLT 250   Recent Labs    12/20/17 0738  NA 137  K 4.0  CL 103  GLUCOSE 209*  BUN 7  CREATININE 0.44  CALCIUM 9.0   CBG (last 3)  Recent Labs    12/21/17 1701 12/21/17 2042 12/22/17 0700  GLUCAP 116* 141* 151*    Wt Readings from Last 3 Encounters:  12/19/17 119.3 kg (263 lb 0.1 oz)  12/17/17 120.3 kg (265 lb 3.4 oz)  07/27/17 117.1 kg (258 lb 3.2 oz)    Physical Exam:  Constitutional: No distress . Vital signs reviewed. HEENT: EOMI, oral membranes moist. Visible wax in right external ear canal Cardiovascular: RRR without murmur. No JVD    Respiratory: CTA Bilaterally without wheezes or rales. Normal effort    GI: BS +, non-tender, non-distended   Musculoskeletal: She exhibits no edema or deformity.  Skin. Warm and dry  Neurological. Alert. Right gaze preference. . Left central 7 and tongue deviation. +left inattention---persistent LUE grossly 3 to 3+/5 prox to distal.  LLE: 3+ to 4/5 prox to distal with some difficulties activitating on command.  Pain sense dimished left arm to LT. RUE and RLE grossly 4+ to 5/5 with normal sensory function. No resting tone.  Psych: pleasant and cooperative    Assessment/Plan: 1. Functional deficits secondary to right MCA infarct which require 3+ hours per day of interdisciplinary therapy in a comprehensive inpatient  rehab setting. Physiatrist is providing close team supervision and 24 hour management of active  medical problems listed below. Physiatrist and rehab team continue to assess barriers to discharge/monitor patient progress toward functional and medical goals.  Function:  Bathing Bathing position   Position: Shower  Bathing parts Body parts bathed by patient: Left arm, Chest, Abdomen, Front perineal area, Right upper leg, Left upper leg Body parts bathed by helper: Buttocks, Right lower leg, Left lower leg, Back, Right arm  Bathing assist Assist Level: Touching or steadying assistance(Pt > 75%)      Upper Body Dressing/Undressing Upper body dressing   What is the patient wearing?: Pull over shirt/dress     Pull over shirt/dress - Perfomed by patient: Thread/unthread right sleeve, Thread/unthread left sleeve, Put head through opening, Pull shirt over trunk          Upper body assist Assist Level: Supervision or verbal cues      Lower Body Dressing/Undressing Lower body dressing   What is the patient wearing?: Pants, Non-skid slipper socks, Ted Hose     Pants- Performed by patient: Thread/unthread right pants leg, Thread/unthread left pants leg, Pull pants up/down   Non-skid slipper socks- Performed by patient: Don/doff right sock Non-skid slipper socks- Performed by helper: Don/doff left sock               TED Hose - Performed by helper: Don/doff right TED hose, Don/doff left TED hose  Lower body assist Assist for lower body dressing: Touching or steadying assistance (Pt > 75%)      Toileting Toileting   Toileting steps completed by patient: Adjust clothing prior to toileting, Performs perineal hygiene, Adjust clothing after toileting Toileting steps completed by helper: Adjust clothing after toileting Toileting Assistive Devices: Grab bar or rail  Toileting assist Assist level: Supervision or verbal cues   Transfers Chair/bed transfer   Chair/bed transfer method:  Ambulatory Chair/bed transfer assist level: Supervision or verbal cues       Locomotion Ambulation     Max distance: 300 Assist level: Touching or steadying assistance (Pt > 75%)   Wheelchair          Cognition Comprehension Comprehension assist level: Understands complex 90% of the time/cues 10% of the time  Expression Expression assist level: Expresses basic needs/ideas: With extra time/assistive device  Social Interaction Social Interaction assist level: Interacts appropriately with others with medication or extra time (anti-anxiety, antidepressant).  Problem Solving Problem solving assist level: Solves basic problems with no assist  Memory Memory assist level: Recognizes or recalls 90% of the time/requires cueing < 10% of the time   Medical Problem List and Plan:  1. 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 DVT Prophylaxis/Anticoagulation: Brilinta  3. Pain Management: seems to be improving   -Tylenol 3/tylenol for headaches as needed    -if persistent can consider topamax for prophylaxis 4. Mood: Provide emotional support  5. Neuropsych: This patient is capable of making decisions on her own behalf.  6. Skin/Wound Care: Routine skin checks  7. Fluids/Electrolytes/Nutrition: encourage PO 8.Dysphagia. Diet adv to D2/thins 9. Tobacco abuse. Provide counseling  10. Hypertension. Permissive hypertension. Monitor with increased mobility    -bp controlled 3/22 11. DM2: hgb A1c 6.7. Blood glucose consistently elevated on labs   -check CBG's ac and hs   -No ssi   -changed to cm diet  13. Ear wax---debrox x 4 days    LOS (Days) 3 A FACE TO FACE EVALUATION WAS PERFORMED  Ranelle Oyster, MD 12/22/2017 9:43 AM

## 2017-12-22 NOTE — Progress Notes (Addendum)
Physical Therapy Session Note  Patient Details  Name: Heather [name] MRN: [id number] Date of Birth: 01-07-1956  Today's Date: 12/22/2017 PT Individual Time: 1300-1415 PT Individual Time Calculation (min): 75 min   Short Term Goals: Week 1:  PT Short Term Goal 1 (Week 1): =LTGS  Skilled Therapeutic Interventions/Progress Updates:    Pt supine in bed, reports 7/10 headache and nausea but is agreeable to participate in therapy session as she just received pain medication from nursing. Pt performs bed mobility and transfers with Supervision. Ambulation 2 x 300 ft with CGA and verbal cues to attend to obstacles in L visual field. Dynamic standing balance activities: tandem ambulation, side-steps with hand-held assist, ball toss with family members with encouragement to turn head to the L, volleyball with family members with focus on use of LUE, min assist for balance. Visual scanning activity with focus on finding sticky notes numbered 1-10 in order in day room with min verbal cues to scan environment. Nustep level 3 x 10 min with B UE/LE for endurance with ACE wrap for LUE assist. Pt left seated in bed with needs in reach, bed alarm set, family present.  Interpreter present during therapy session.  Therapy Documentation Precautions:  Precautions Precautions: Fall Precaution Comments: L inattention Restrictions Weight Bearing Restrictions: No  See Function Navigator for Current Functional Status.   Therapy/Group: Individual Therapy   Peter Congo, PT, DPT  12/22/2017, 3:57 PM

## 2017-12-22 NOTE — Progress Notes (Signed)
Occupational Therapy Session Note  Patient Details  Name: Heather [name] MRN: [id number] Date of Birth: 01/17/1956  Today's Date: 12/22/2017 OT Individual Time: 1100-1200 OT Individual Time Calculation (min): 60 min    Short Term Goals: Week 1:  OT Short Term Goal 1 (Week 1): LTG=STG  Skilled Therapeutic Interventions/Progress Updates:    Pt resting in bed upon arrival with daughter and interpreter present. Pt c/o 8/10 HA but desired to take a shower to "make her feel better." Pt amb with HHA to bathroom for shower with sit<>stand from [facility] in shower.  Pt required assistance with bathing feet secondary to limited space in shower.  Pt returned to bed to complete dressing tasks.  Pt c/o occasional dizziness with no nystagmus noted.  BP 147/85. Pt also c/o seeing "floaters" in R eye.  RN notified.  Pt initiates use of LUE but grasp is not functional.  Pt returned to bed and remained in bed with all needs within reach and family present.   Therapy Documentation Precautions:  Precautions Precautions: Fall Precaution Comments: L inattention Restrictions Weight Bearing Restrictions: No   Pain: Pt c/o 8/10 HA on right (throbbing); pt also c/o seeing floaters and n/v with occasional dizziness; RN aware See Function Navigator for Current Functional Status.   Therapy/Group: Individual Therapy  Rich Brave 12/22/2017, 3:12 PM

## 2017-12-22 NOTE — IPOC Note (Signed)
Overall Plan of Care [facility]) Patient Details Name: Ellory Khurana MRN: [id number] DOB: December 18, 1955  Admitting Diagnosis: <principal problem not specified>right MCA infarct  Hospital Problems: Active Problems:   Right middle cerebral artery stroke (HCC)   Left hemiparesis (HCC)   Dysphagia, oropharyngeal   Diabetes mellitus type 2 in obese [facility])     Functional Problem List: Nursing Endurance, Medication Management, Perception, Safety, Bowel  PT Balance, Endurance, Perception, Safety, Motor  OT Balance, Cognition, Safety, Perception, Edema, Endurance, Motor, Pain, Vision  SLP Cognition, Perception, Safety  TR         Basic ADL's: OT Eating, Bathing, Dressing, Toileting, Grooming     Advanced  ADL's: OT       Transfers: PT Bed Mobility, Bed to Chair, Car, Occupational psychologist, Research scientist (life sciences): PT Stairs, Psychologist, prison and probation services, Ambulation     Additional Impairments: OT Fuctional Use of Upper Extremity  SLP Swallowing, Social Cognition   Social Interaction, Problem Solving, Memory, Attention, Awareness  TR      Anticipated Outcomes Item Anticipated Outcome  Self Feeding supervision  Swallowing  Supervision   Basic self-care  supervision  Toileting  supervision    Bathroom Transfers supervision  Bowel/Bladder  manage bowel with mod I assist  Transfers  supervision  Locomotion  supervision  Communication     Cognition  Supervision  Pain     Safety/Judgment  maintain safety with cues/resources   Therapy Plan: PT Intensity: Minimum of 1-2 x/day ,45 to 90 minutes PT Frequency: 5 out of 7 days PT Duration Estimated Length of Stay: 7-10 days OT Intensity: Minimum of 1-2 x/day, 45 to 90 minutes OT Frequency: 5 out of 7 days OT Duration/Estimated Length of Stay: ~7-10 days SLP Intensity: Minumum of 1-2 x/day, 30 to 90 minutes SLP Frequency: 3 to 5 out of 7 days SLP Duration/Estimated Length of Stay: 7 to 10 days    Team Interventions: Nursing  Interventions Patient/Family Education, Dysphagia/Aspiration Precaution Training, Medication Management, Bowel Management, Disease Management/Prevention, Discharge Planning  PT interventions Ambulation/gait training, Community reintegration, DME/adaptive equipment instruction, Neuromuscular re-education, Psychosocial support, Stair training, UE/LE Strength taining/ROM, Wheelchair propulsion/positioning, UE/LE Coordination activities, Therapeutic Activities, Warden/ranger, Discharge planning, Cognitive remediation/compensation, Disease management/prevention, Functional mobility training, Patient/family education, Splinting/orthotics, Therapeutic Exercise, Visual/perceptual remediation/compensation  OT Interventions Balance/vestibular training, Discharge planning, Pain management, Self Care/advanced ADL retraining, Therapeutic Activities, UE/LE Coordination activities, Cognitive remediation/compensation, Disease mangement/prevention, Functional mobility training, Patient/family education, Skin care/wound managment, Therapeutic Exercise, Visual/perceptual remediation/compensation, Firefighter, Fish farm manager, Neuromuscular re-education, Psychosocial support, Splinting/orthotics, Wheelchair propulsion/positioning, UE/LE Strength taining/ROM, Functional electrical stimulation  SLP Interventions Cognitive remediation/compensation, Cueing hierarchy, Dysphagia/aspiration precaution training, Functional tasks, Patient/family education, Therapeutic Activities  TR Interventions    SW/CM Interventions Discharge Planning, Patient/Family Education, Psychosocial Support   Barriers to Discharge MD  Medical stability  Nursing      PT      OT      SLP      SW       Team Discharge Planning: Destination: PT-Home ,OT- Home , SLP-Home Projected Follow-up: PT-Home health PT, 24 hour supervision/assistance, OT-  Outpatient OT, SLP-24 hour supervision/assistance Projected  Equipment Needs: PT-To be determined, OT- To be determined, SLP-To be determined Equipment Details: PT- , OT-  Patient/family involved in discharge planning: PT- Patient,  OT-Patient, SLP-Patient, Family member/caregiver  MD ELOS: 7-10 days Medical Rehab Prognosis:  Excellent Assessment: The patient has been admitted for CIR therapies with the diagnosis of right MCA infarct. The team will  be addressing functional mobility, strength, stamina, balance, safety, adaptive techniques and equipment, self-care, bowel and bladder mgt, patient and caregiver education, visual-spatial awareness, NMR, family education, pain control, cognition, communication. Goals have been set at supervision for mobility, self-care and cognition/communication.    Ranelle Oyster, MD, FAAPMR      See Team Conference Notes for weekly updates to the plan of care

## 2017-12-22 NOTE — Progress Notes (Signed)
Speech Language Pathology Daily Session Note  Patient Details  Name: Heather [name] MRN: [id number] Date of Birth: Oct 18, 1955  Today's Date: 12/22/2017 SLP Individual Time: 0830-0930 SLP Individual Time Calculation (min): 60 min  Short Term Goals: Week 1: SLP Short Term Goal 1 (Week 1): Pt will demonstrate selective attention in moderately distracting environment for ~ 45 minutes with supervision cues.  SLP Short Term Goal 2 (Week 1): Pt will demonstrate intellectual awareness by listing 3 physical and 2 cognitive/dysphagia deficits related to acute CVa with supervision cues.  SLP Short Term Goal 3 (Week 1): Pt will scan to left of environment to locate items in 90% of opportunities with supervision cues.  SLP Short Term Goal 4 (Week 1): Pt will consume least restrictive diet with minimal overt s/s of aspiration with supervision cues for use of compensatory swallow strategies.  SLP Short Term Goal 5 (Week 1): Pt will complete semi-complex problem solving tasks with supervision cues.   Skilled Therapeutic Interventions: Skilled treatment session focused on dysphagia and cognition goals. SLP facilitated session by providing mirror for use of monitoring anterior left spillage. Pt immediately recalled need to place bolus on her right. Pt with no anterior spillage and then mirror was removed. Pt able to continue monitoring and didn't allow any spillage. Continue current diet. SLP further facilitated session by providing Min A to supervision cues to complete semi-complex medication management tasks. Pt was returned to room and left upright in bed. Continue per current plan of care.      Function:  Eating Eating   Modified Consistency Diet: Yes Eating Assist Level: More than reasonable amount of time           Cognition Comprehension Comprehension assist level: Understands complex 90% of the time/cues 10% of the time  Expression      Social Interaction Social Interaction assist level:  Interacts appropriately with others with medication or extra time (anti-anxiety, antidepressant).  Problem Solving Problem solving assist level: Solves complex 90% of the time/cues < 10% of the time  Memory Memory assist level: Recognizes or recalls 90% of the time/requires cueing < 10% of the time;More than reasonable amount of time    Pain Pain Assessment Pain Score: 8   Therapy/Group: Individual Therapy  Heather [name] 12/22/2017, 12:21 PM

## 2017-12-23 ENCOUNTER — Inpatient Hospital Stay (HOSPITAL_COMMUNITY): Payer: Self-pay

## 2017-12-23 ENCOUNTER — Inpatient Hospital Stay (HOSPITAL_COMMUNITY): Payer: Self-pay | Admitting: Physical Therapy

## 2017-12-23 ENCOUNTER — Inpatient Hospital Stay (HOSPITAL_COMMUNITY): Payer: Self-pay | Admitting: Occupational Therapy

## 2017-12-23 LAB — GLUCOSE, CAPILLARY
GLUCOSE-CAPILLARY: 129 mg/dL — AB (ref 65–99)
GLUCOSE-CAPILLARY: 176 mg/dL — AB (ref 65–99)
Glucose-Capillary: 146 mg/dL — ABNORMAL HIGH (ref 65–99)
Glucose-Capillary: 154 mg/dL — ABNORMAL HIGH (ref 65–99)

## 2017-12-23 NOTE — Progress Notes (Signed)
Speech Language Pathology Daily Session Note  Patient Details  Name: Safia Panzer MRN: [id number] Date of Birth: April 19, 1956  Today's Date: 12/23/2017 SLP Individual Time: 1130-1200 SLP Individual Time Calculation (min): 30 min  Short Term Goals: Week 1: SLP Short Term Goal 1 (Week 1): Pt will demonstrate selective attention in moderately distracting environment for ~ 45 minutes with supervision cues.  SLP Short Term Goal 2 (Week 1): Pt will demonstrate intellectual awareness by listing 3 physical and 2 cognitive/dysphagia deficits related to acute CVa with supervision cues.  SLP Short Term Goal 3 (Week 1): Pt will scan to left of environment to locate items in 90% of opportunities with supervision cues.  SLP Short Term Goal 4 (Week 1): Pt will consume least restrictive diet with minimal overt s/s of aspiration with supervision cues for use of compensatory swallow strategies.  SLP Short Term Goal 5 (Week 1): Pt will complete semi-complex problem solving tasks with supervision cues.   Skilled Therapeutic Interventions:Skilled ST services focused on swallow and cognitive skills. Interpreter was not present for session, therefore language barrier lead to decreasing understanding of cues and directions. SLP facilitated PO consumption trials of dys 3, pt required max A verbal cues to place cracker on right side of mouth, demonstrated moderate left side anterior spillage and was unable to manage given verbal/visual cues. Pt demonstrated no overt s/s aspiration. SLP facilitated problem solving, counting money ,pt required min A verbal/visual cues to scan left and mod A verbal cues for problem solving, however likely due to language barrier compared to cognitive ability.  Pt was left in room with call bell within reach. Reccomend to continue skilled ST services.       Function:  Eating Eating   Modified Consistency Diet: Yes(dys 3 trial ) Eating Assist Level: More than reasonable amount of  time;Supervision or verbal cues;Helper checks for pocketed food           Cognition Comprehension Comprehension assist level: Follows basic conversation/direction with no assist(interpert not present )  Expression   Expression assist level: Expresses basic needs/ideas: With extra time/assistive device(interpert not present )  Social Interaction Social Interaction assist level: Interacts appropriately with others with medication or extra time (anti-anxiety, antidepressant).  Problem Solving Problem solving assist level: Solves basic 50 - 74% of the time/requires cueing 25 - 49% of the time(interpert not present )  Memory Memory assist level: Recognizes or recalls 75 - 89% of the time/requires cueing 10 - 24% of the time(interpert not present )    Pain Pain Assessment Pain Score: 0-No pain Pain Type: Acute pain Pain Location: Head Pain Descriptors / Indicators: Aching;Headache Pain Intervention(s): Medication (See eMAR)  Therapy/Group: Individual Therapy  Natalye Kott  [facility] 12/23/2017, 2:45 PM

## 2017-12-23 NOTE — Progress Notes (Signed)
Heather [name] is a 62 y.o. female Dec 29, 1955 [id number]  Subjective: No new complaints. No new problems. Slept well. Feeling OK.  There is some low-grade headache present (not new)  Objective: Vital signs in last 24 hours: Temp:  [97.3 F (36.3 C)] 97.3 F (36.3 C) (03/22 1500) Pulse Rate:  [73] 73 (03/22 1500) Resp:  [18] 18 (03/22 1500) BP: (129)/(92) 129/92 (03/22 1500) SpO2:  [97 %] 97 % (03/22 1500) Weight change:  Last BM Date: 12/18/17  Intake/Output from previous day: 03/22 0701 - 03/23 0700 In: 600 [P.O.:600] Out: -  Last cbgs: CBG (last 3)  Recent Labs    12/22/17 2113 12/23/17 0731 12/23/17 1139  GLUCAP 189* 146* 154*     Physical Exam General: No apparent distress   HEENT: not dry Lungs: Normal effort. Lungs clear to auscultation, no crackles or wheezes. Cardiovascular: Regular rate and rhythm, no edema Abdomen: S/NT/ND; BS(+) Musculoskeletal:  unchanged Neurological: No new neurological deficits Wounds: N/A    Skin: clear  Aging changes Mental state: Alert, oriented, cooperative    Lab Results: BMET    Component Value Date/Time   NA 137 12/20/2017 0738   K 4.0 12/20/2017 0738   CL 103 12/20/2017 0738   CO2 26 12/20/2017 0738   GLUCOSE 209 (H) 12/20/2017 0738   BUN 7 12/20/2017 0738   CREATININE 0.44 12/20/2017 0738   CREATININE 0.53 09/05/2016 1017   CALCIUM 9.0 12/20/2017 0738   GFRNONAA >60 12/20/2017 0738   GFRAA >60 12/20/2017 0738   CBC    Component Value Date/Time   WBC 10.5 12/20/2017 0738   RBC 4.45 12/20/2017 0738   HGB 13.4 12/20/2017 0738   HGB 14.9 07/06/2017 1550   HCT 41.3 12/20/2017 0738   HCT 43.7 07/06/2017 1550   PLT 250 12/20/2017 0738   PLT 295 07/06/2017 1550   MCV 92.8 12/20/2017 0738   MCV 89 07/06/2017 1550   MCH 30.1 12/20/2017 0738   MCHC 32.4 12/20/2017 0738   RDW 12.9 12/20/2017 0738   RDW 13.3 07/06/2017 1550   LYMPHSABS 1.6 12/20/2017 0738   MONOABS 0.7 12/20/2017 0738   EOSABS 0.0 12/20/2017  0738   BASOSABS 0.0 12/20/2017 0738    Studies/Results: No results found.  Medications: I have reviewed the patient's current medications.  Assessment/Plan:   1.  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 CIR 2.  DVT prophylaxis with Brilinta 3.  Pain management with Tylenol.  May need Topamax for headaches 4.  Dysphagia.  Diet advanced to D2/thins 5.  Tobacco smoking.  She was provided with counseling 6.  Hypertension.  We will continue to monitor 7.  Type 2 diabetes 8.  Earwax.  Receive Debrox     Length of stay, days: 4  Sonda Primes , MD 12/23/2017, 2:30 PM

## 2017-12-23 NOTE — Plan of Care (Signed)
  Problem: RH BOWEL ELIMINATION Goal: RH STG MANAGE BOWEL WITH ASSISTANCE Description STG Manage Bowel with mod I Assistance. Per report LBM 3/18 or 3/20 laxatives given Outcome: Not Progressing

## 2017-12-23 NOTE — Progress Notes (Signed)
Physical Therapy Session Note  Patient Details  Name: Heather [name] MRN: [id number] Date of Birth: [birth year]  Today's Date: 12/23/2017 PT Individual Time: 1300-1430 PT Individual Time Calculation (min): 90 min   Short Term Goals: Week 1:  PT Short Term Goal 1 (Week 1): =LTGS  Skilled Therapeutic Interventions/Progress Updates:   Pt received supine in bed and agreeable to PT. Supine>sit transfer with supervision assist and min cues for safety.   Gait in various environment with min-supervision assist to perform pathfinding tasks. 3 x 500+ft through hospital to locate [employer], gift shop and west wing. Moderate cues for use of bilingual signs and retention of target destination. 144f through hospital gift shop to locate 3 object in order with min cues for order to objects.   Dynavision balance and visual scanning neuromotor re-education: Level surface, Program A, 4 rings. 18.19,17 respectively. Improved attention to theL with increased repetition.  Unlevel surface, program A 4 ings, 19,20,19. Respectively. Min assist on unlevel surface to prevent LOB to the R x 2 throughout dynavision reaching task.   UE Neuromotor Re-ed with Supination/proation with beach ball turns.  R/L trunk rotation while hold beach ball with shoulders flexed to 90eg diagonals while hold beach ball x 12 Bil.  Ball toss in sitting within and outside BOS x 30  Arm shuddle 3 x 30-45 seconds.  Moderate cues for improved Neuromotor control and attention to the LUE throughout to minimize compensatory movement force use of fine motor musculature.   Pt returned to room and performed ambulatory transfer to bed with supervision. Sit>supine completed with supervision assist, and pt left supine in bed with call bell in reach and all needs met.         Therapy Documentation Precautions:  Precautions Precautions: Fall Precaution Comments: L inattention Restrictions Weight Bearing Restrictions: No   Pain: Pain  Assessment Pain Score: 7  Pain Type: Acute pain Pain Location: Head Pain Descriptors / Indicators: Aching;Headache Pain Intervention(s): Medication (See eMAR)   See Function Navigator for Current Functional Status.   Therapy/Group: Individual Therapy  ALorie Phenix3/23/2019, 2:38 PM

## 2017-12-23 NOTE — Progress Notes (Signed)
Occupational Therapy Session Note  Patient Details  Name: Heather [name] MRN: [id number] Date of Birth: September 09, 1956  Today's Date: 12/23/2017 OT Individual Time: 1016-1130 OT Individual Time Calculation (min): 74 min   Short Term Goals: Week 1:  OT Short Term Goal 1 (Week 1): LTG=STG  Skilled Therapeutic Interventions/Progress Updates:    Pt greeted supine in bed. Interpretor present. Tx focus on balance, Lt NMR, Lt attention, and motor planning during bathing and dressing tasks. Pt ambulated throughout session with HHA on Lt side. In shower, pt bathed at sit<stand level once IVs were covered. She required overall Mod A and HOH for functional use of Lt due to grasp deficits. Max cues required for clothing orientation while seated EOB due to apraxia.  She also needed help elevating LB garments on Lt side. Pt able to scan for grooming items placed on Lt side with extra time and min vcs, HOH for maintaining grasp with Lt while reaching for/using stated items. At end of session pt returned to supine, boosted herself up in bed with instruction, and was left with all needs within reach.   Pt c/o nausea and headache at start of session. Symptoms therapeutically addressed by providing peppermint oil via inhalation + ice pack to forehead. Pt reported both interventions provided relief from stated symptoms  Therapy Documentation Precautions:  Precautions Precautions: Fall Precaution Comments: L inattention Restrictions Weight Bearing Restrictions: No Pain: No c/o pain during session  Pain Assessment Pain Score: 7  Pain Type: Acute pain Pain Location: Head Pain Descriptors / Indicators: Aching;Headache Pain Intervention(s): Medication (See eMAR) ADL: ADL ADL Comments: see functional navigator     See Function Navigator for Current Functional Status.   Therapy/Group: Individual Therapy  Heather [name] A Della Scrivener 12/23/2017, 1:22 PM

## 2017-12-24 ENCOUNTER — Inpatient Hospital Stay (HOSPITAL_COMMUNITY): Payer: Self-pay | Admitting: Occupational Therapy

## 2017-12-24 LAB — GLUCOSE, CAPILLARY
GLUCOSE-CAPILLARY: 151 mg/dL — AB (ref 65–99)
Glucose-Capillary: 163 mg/dL — ABNORMAL HIGH (ref 65–99)
Glucose-Capillary: 196 mg/dL — ABNORMAL HIGH (ref 65–99)
Glucose-Capillary: 118 mg/dL — ABNORMAL HIGH (ref 65–99)

## 2017-12-24 NOTE — Progress Notes (Signed)
Heather [name] is a 62 y.o. female 1956-08-30 [id number]  Subjective: No new complaints. No new problems. Slept well. Feeling OK.  Heather [name] is at bedside translating.  The headache is better  Objective: Vital signs in last 24 hours: Temp:  [98.5 F (36.9 C)] 98.5 F (36.9 C) (03/24 0500) Pulse Rate:  [70] 70 (03/24 0500) Resp:  [18] 18 (03/24 0500) BP: (125)/(77) 125/77 (03/24 0500) SpO2:  [99 %] 99 % (03/24 0500) Weight change:  Last BM Date: 12/18/17  Intake/Output from previous day: 03/23 0701 - 03/24 0700 In: 120 [P.O.:120] Out: 7 [Urine:5; Stool:2] Last cbgs: CBG (last 3)  Recent Labs    12/23/17 2114 12/24/17 0638 12/24/17 1138  GLUCAP 176* 163* 151*     Physical Exam General: No apparent distress   HEENT: not dry Lungs: Normal effort. Lungs clear to auscultation, no crackles or wheezes. Cardiovascular: Regular rate and rhythm, no edema Abdomen: S/NT/ND; BS(+) Musculoskeletal:  unchanged Neurological: No new neurological deficits Wounds: N/A    Skin: clear  Aging changes Mental state: Alert, oriented, cooperative    Lab Results: BMET    Component Value Date/Time   NA 137 12/20/2017 0738   K 4.0 12/20/2017 0738   CL 103 12/20/2017 0738   CO2 26 12/20/2017 0738   GLUCOSE 209 (H) 12/20/2017 0738   BUN 7 12/20/2017 0738   CREATININE 0.44 12/20/2017 0738   CREATININE 0.53 09/05/2016 1017   CALCIUM 9.0 12/20/2017 0738   GFRNONAA >60 12/20/2017 0738   GFRAA >60 12/20/2017 0738   CBC    Component Value Date/Time   WBC 10.5 12/20/2017 0738   RBC 4.45 12/20/2017 0738   HGB 13.4 12/20/2017 0738   HGB 14.9 07/06/2017 1550   HCT 41.3 12/20/2017 0738   HCT 43.7 07/06/2017 1550   PLT 250 12/20/2017 0738   PLT 295 07/06/2017 1550   MCV 92.8 12/20/2017 0738   MCV 89 07/06/2017 1550   MCH 30.1 12/20/2017 0738   MCHC 32.4 12/20/2017 0738   RDW 12.9 12/20/2017 0738   RDW 13.3 07/06/2017 1550   LYMPHSABS 1.6 12/20/2017 0738   MONOABS 0.7 12/20/2017  0738   EOSABS 0.0 12/20/2017 0738   BASOSABS 0.0 12/20/2017 0738    Studies/Results: No results found.  Medications: I have reviewed the patient's current medications.  Assessment/Plan:    1.  Left hemiparesis with visual deficit secondary to right MCA stroke.  CIR  2.  DVT prophylaxis with Brilinta 3.  Pain management-Tylenol.  Topamax is being considered for headaches 4.  Dysphagia.  Diet was advanced to D2/thins 5.  Tobacco smoking.  Counseling was provided previously 6.  Hypertension.  Monitoring blood pressure 7.  Type 2 diabetes 8.  Earwax         Length of stay, days: 5  Sonda Primes , MD 12/24/2017, 11:41 AM  Heather [name] is at bedside translating

## 2017-12-24 NOTE — Progress Notes (Signed)
Occupational Therapy Session Note  Patient Details  Name: Heather [name] MRN: [id number] Date of Birth: Feb 09, 1956  Today's Date: 12/24/2017 OT Individual Time: 0900-1000 OT Individual Time Calculation (min): 60 min   Short Term Goals: Week 1:  OT Short Term Goal 1 (Week 1): LTG=STG  Skilled Therapeutic Interventions/Progress Updates:    Pt greeted supine in bed. Interpretor present. Tx focus on dynamic balance, Lt NMR, Lt attention, and activity tolerance during bathing and dressing tasks at shower level. Pt ambulating throughout session with HHA on Lt side. Able to maintain functional grip with Lt today throughout tasks and therefore increasing independence. Pt motivated to try fastening her hair in ponytail and was able to twist hair elastic with Lt hand! She was very encouraged to see this progress. Max vcs for problem solving/awareness required for dressing due to apraxia. Grandson present throughout session to observe and asking appropriate questions. At end of session pt left EOB eating breakfast with grandson present.   Pt c/o nausea near end of session. Addressed therapeutically with use of orange/ginger essential oil blend via inhalation. Pt reporting this helped to decrease symptoms.   Therapy Documentation Precautions:  Precautions Precautions: Fall Precaution Comments: L inattention Restrictions Weight Bearing Restrictions: No Pain: No c/o pain during session    ADL: ADL ADL Comments: see functional navigator    See Function Navigator for Current Functional Status.   Therapy/Group: Individual Therapy  Bracken Moffa A Vibha Ferdig 12/24/2017, 10:41 AM

## 2017-12-25 ENCOUNTER — Inpatient Hospital Stay (HOSPITAL_COMMUNITY): Payer: Self-pay

## 2017-12-25 ENCOUNTER — Inpatient Hospital Stay (HOSPITAL_COMMUNITY): Payer: Self-pay | Admitting: Physical Therapy

## 2017-12-25 LAB — GLUCOSE, CAPILLARY
GLUCOSE-CAPILLARY: 145 mg/dL — AB (ref 65–99)
Glucose-Capillary: 131 mg/dL — ABNORMAL HIGH (ref 65–99)
Glucose-Capillary: 153 mg/dL — ABNORMAL HIGH (ref 65–99)
Glucose-Capillary: 144 mg/dL — ABNORMAL HIGH (ref 65–99)

## 2017-12-25 NOTE — Progress Notes (Signed)
Speech Language Pathology Daily Session Note  Patient Details  Name: Heather [name] MRN: [id number] Date of Birth: April 27, 1956  Today's Date: 12/25/2017 SLP Individual Time: 0830-0930 SLP Individual Time Calculation (min): 60 min  Short Term Goals: Week 1: SLP Short Term Goal 1 (Week 1): Pt will demonstrate selective attention in moderately distracting environment for ~ 45 minutes with supervision cues.  SLP Short Term Goal 2 (Week 1): Pt will demonstrate intellectual awareness by listing 3 physical and 2 cognitive/dysphagia deficits related to acute CVa with supervision cues.  SLP Short Term Goal 3 (Week 1): Pt will scan to left of environment to locate items in 90% of opportunities with supervision cues.  SLP Short Term Goal 4 (Week 1): Pt will consume least restrictive diet with minimal overt s/s of aspiration with supervision cues for use of compensatory swallow strategies.  SLP Short Term Goal 5 (Week 1): Pt will complete semi-complex problem solving tasks with supervision cues.   Skilled Therapeutic Interventions:Skilled ST services focused on swallow and cognitive skills. Interpreter was present. SLP facilitated PO consumption trial of dys 3, pt demonstrated mod I oral control utilizing mirror to manage anterior loss and without mirror supervision A question cues. SLP facilitated medication management focusing on semi-complex problem solving, pt required mod A verbal cues for problem solving and monitor/correcting errors. SLP facilitated semi-complex problem solving in novel card game, Blink, played with strategy, pt required mod A fading to min A verbal cues. Pt required min-supervision A verbal cues to csn to left during all functional tasks. Pt was left in room with call bell within reach. Reccomend to continue skilled ST services.        Function:  Eating Eating   Modified Consistency Diet: Yes Eating Assist Level: More than reasonable amount of time;Supervision or verbal cues            Cognition Comprehension Comprehension assist level: Follows basic conversation/direction with extra time/assistive device  Expression   Expression assist level: Expresses basic needs/ideas: With extra time/assistive device  Social Interaction Social Interaction assist level: Interacts appropriately with others with medication or extra time (anti-anxiety, antidepressant).  Problem Solving Problem solving assist level: Solves basic 50 - 74% of the time/requires cueing 25 - 49% of the time;Solves basic 75 - 89% of the time/requires cueing 10 - 24% of the time  Memory Memory assist level: Recognizes or recalls 75 - 89% of the time/requires cueing 10 - 24% of the time    Pain Pain Assessment Pain Score: 0-No pain  Therapy/Group: Individual Therapy  Oluwadarasimi Redmon  [facility] 12/25/2017, 4:48 PM

## 2017-12-25 NOTE — Progress Notes (Signed)
[place] PHYSICAL MEDICINE & REHABILITATION     PROGRESS NOTE    Subjective/Complaints: Making improvements. Pain under control. Seemed to sleep well last night  ROS: limited due to language/communication   Objective: Vital Signs: Blood pressure 114/69, pulse 79, temperature 98.1 F (36.7 C), temperature source Oral, resp. rate 18, height 5\' 8"  (1.727 m), weight 119.3 kg (263 lb 0.1 oz), SpO2 97 %. No results found. No results for input(s): WBC, HGB, HCT, PLT in the last 72 hours. No results for input(s): NA, K, CL, GLUCOSE, BUN, CREATININE, CALCIUM in the last 72 hours.  Invalid input(s): CO CBG (last 3)  Recent Labs    12/24/17 1647 12/24/17 2132 12/25/17 0613  GLUCAP 196* 118* 145*    Wt Readings from Last 3 Encounters:  12/19/17 119.3 kg (263 lb 0.1 oz)  12/17/17 120.3 kg (265 lb 3.4 oz)  07/27/17 117.1 kg (258 lb 3.2 oz)    Physical Exam:  Constitutional: No distress . Vital signs reviewed. HEENT: EOMI, oral membranes moist Cardiovascular: RRR without murmur. No JVD    Respiratory: CTA Bilaterally without wheezes or rales. Normal effort    GI: BS +, non-tender, non-distended    Musculoskeletal: She exhibits no edema or deformity.  Skin. Warm and dry  Neurological. Alert. Right gaze preference. . Left central 7   With ongoing left inattention---persistent LUE grossly 3 to 3+/5 prox to distal.  LLE: 3+ to 4/5 prox to distal with some difficulties activitating on command.  Pain sense dimished left arm to LT. RUE and RLE grossly 4+ to 5/5 with normal sensory function. No resting tone.  Psych: pleasant and cooperative    Assessment/Plan: 1. Functional deficits secondary to right MCA infarct which require 3+ hours per day of interdisciplinary therapy in a comprehensive inpatient rehab setting. Physiatrist is providing close team supervision and 24 hour management of active medical problems listed below. Physiatrist and rehab team continue to assess barriers to  discharge/monitor patient progress toward functional and medical goals.  Function:  Bathing Bathing position   Position: Shower  Bathing parts Body parts bathed by patient: Left arm, Chest, Abdomen, Front perineal area, Right upper leg, Left upper leg, Buttocks Body parts bathed by helper: Right lower leg, Left lower leg, Back  Bathing assist Assist Level: Touching or steadying assistance(Pt > 75%)      Upper Body Dressing/Undressing Upper body dressing   What is the patient wearing?: (night gown)     Pull over shirt/dress - Perfomed by patient: Put head through opening, Pull shirt over trunk, Thread/unthread left sleeve, Thread/unthread right sleeve Pull over shirt/dress - Perfomed by helper: Thread/unthread right sleeve, Thread/unthread left sleeve        Upper body assist Assist Level: Supervision or verbal cues      Lower Body Dressing/Undressing Lower body dressing   What is the patient wearing?: Underwear, Pants, Non-skid slipper socks Underwear - Performed by patient: Thread/unthread right underwear leg, Thread/unthread left underwear leg, Pull underwear up/down Underwear - Performed by helper: Pull underwear up/down Pants- Performed by patient: Thread/unthread right pants leg, Thread/unthread left pants leg, Pull pants up/down Pants- Performed by helper: Pull pants up/down Non-skid slipper socks- Performed by patient: Don/doff right sock Non-skid slipper socks- Performed by helper: Don/doff left sock               TED Hose - Performed by helper: Don/doff right TED hose, Don/doff left TED hose  Lower body assist Assist for lower body dressing: Touching or steadying assistance (  Pt > 75%)      Toileting Toileting   Toileting steps completed by patient: Adjust clothing prior to toileting, Performs perineal hygiene, Adjust clothing after toileting Toileting steps completed by helper: Adjust clothing after toileting Toileting Assistive Devices: Grab bar or rail   Toileting assist Assist level: Supervision or verbal cues   Transfers Chair/bed transfer   Chair/bed transfer method: Stand pivot Chair/bed transfer assist level: Supervision or verbal cues       Locomotion Ambulation     Max distance: 583ft Assist level: Touching or steadying assistance (Pt > 75%)   Wheelchair          Cognition Comprehension Comprehension assist level: Follows basic conversation/direction with extra time/assistive device(spanish speaking)  Expression Expression assist level: Expresses basic needs/ideas: With extra time/assistive device(spanish speaking)  Social Interaction Social Interaction assist level: Interacts appropriately with others with medication or extra time (anti-anxiety, antidepressant).  Problem Solving Problem solving assist level: Solves basic 50 - 74% of the time/requires cueing 25 - 49% of the time(spanish speaking)  Memory Memory assist level: Recognizes or recalls 75 - 89% of the time/requires cueing 10 - 24% of the time   Medical Problem List and Plan:  1. 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 DVT Prophylaxis/Anticoagulation: Brilinta  3. Pain Management: headaches improving    -Tylenol 3/tylenol for headaches as needed     4. Mood: Provide emotional support  5. Neuropsych: This patient is capable of making decisions on her own behalf.  6. Skin/Wound Care: Routine skin checks  7. Fluids/Electrolytes/Nutrition: encourage PO 8.Dysphagia. Diet adv to D2/thins 9. Tobacco abuse. Provide counseling  10. Hypertension. Permissive hypertension. Monitor with increased mobility    -bp controlled 3/22 11. DM2: hgb A1c 6.7.     -checking CBG's ac and hs   -No ssi   -changed to cm diet    -consider oral agent 13. Ear wax with some imrprovement--debrox x 4 days    LOS (Days) 6 A FACE TO FACE EVALUATION WAS PERFORMED  Ranelle Oyster, MD 12/25/2017 9:14 AM

## 2017-12-25 NOTE — Progress Notes (Signed)
Physical Therapy Session Note  Patient Details  Name: Heather [name] MRN: [id number] Date of Birth: [birth year]  Today's Date: 12/25/2017 PT Individual Time: 1300-1400 PT Individual Time Calculation (min): 60 min   Short Term Goals: Week 1:  PT Short Term Goal 1 (Week 1): =LTGS  Skilled Therapeutic Interventions/Progress Updates:    Pt supine in bed, agreeable to participate in therapy session. Pt reports no pain this PM. Bed mobility Supervision, sit to stand with Supervision. Ambulation 2 x 300 ft with Supervision, verbal cues to attend to left visual field to avoid obstacles. Ascend/descend 2 x 12 stairs with one handrail and CGA to Min Assist for balance, step-to gait pattern. Standing balance: static stance on airex while bouncing ball on trampoline, focus on use of LUE to throw ball, side-steps with ball toss while engaging in cognitive tasks, seated ball kick/ball toss with focus on reaching outside BOS and reaction time. Numbered sticky note search through day room with focus on attending to left visual field to find targets, minimal verbal cueing needed. Pt left seated EOB set up for lunch, needs in reach, bed alarm in place.  Interpreter present throughout therapy session.  Therapy Documentation Precautions:  Precautions Precautions: Fall Precaution Comments: L inattention Restrictions Weight Bearing Restrictions: No  See Function Navigator for Current Functional Status.   Therapy/Group: Individual Therapy   Peter Congo, PT, DPT  12/25/2017, 2:02 PM

## 2017-12-25 NOTE — Plan of Care (Signed)
  Problem: RH BOWEL ELIMINATION Goal: RH STG MANAGE BOWEL W/MEDICATION W/ASSISTANCE Description STG Manage Bowel with Medication with mod I Assistance.  Outcome: Progressing Flowsheets (Taken 12/25/2017 1212) STG: Pt will manage bowels with medication with assistance: 6-Modified independent   Problem: Consults Goal: RH STROKE PATIENT EDUCATION Description See Patient Education module for education specifics  Outcome: Progressing   Problem: RH BOWEL ELIMINATION Goal: RH STG MANAGE BOWEL WITH ASSISTANCE Description STG Manage Bowel with mod I Assistance.  Outcome: Progressing Flowsheets (Taken 12/25/2017 1212) STG: Pt will manage bowels with assistance: 6-Modified independent Goal: RH STG MANAGE BOWEL W/MEDICATION W/ASSISTANCE Description STG Manage Bowel with Medication with mod I Assistance.  Outcome: Progressing Flowsheets (Taken 12/25/2017 1212) STG: Pt will manage bowels with medication with assistance: 6-Modified independent   Problem: RH SAFETY Goal: RH STG ADHERE TO SAFETY PRECAUTIONS W/ASSISTANCE/DEVICE Description STG Adhere to Safety Precautions With cues/reminders-Assistance/Device.  Outcome: Progressing Flowsheets (Taken 12/25/2017 1212) STG:Pt will adhere to safety precautions with assistance/device: 4-Minimal assistance   Problem: RH PAIN MANAGEMENT Goal: RH STG PAIN MANAGED AT OR BELOW PT'S PAIN GOAL Description At or below level 4  Outcome: Progressing   Problem: RH KNOWLEDGE DEFICIT Goal: RH STG INCREASE KNOWLEDGE OF HYPERTENSION Description Pt and family will be able to explain HTN management and diet restrictions for low salt, low fat, low cholesterol diet independently using cues/reminders/resources provided  Outcome: Progressing Goal: RH STG INCREASE KNOWLEDGE OF DYSPHAGIA/FLUID INTAKE Description Pt and family will be able to explain dysphagia diet and demonstrate knowledge of precautions to take to avoid aspiration using cues/ reminders  Outcome:  Progressing

## 2017-12-25 NOTE — Progress Notes (Signed)
Occupational Therapy Session Note  Patient Details  Name: Heather [name] MRN: [id number] Date of Birth: 03-30-1956  Today's Date: 12/25/2017 OT Individual Time: 1100-1200 OT Individual Time Calculation (min): 60 min    Short Term Goals: Week 1:  OT Short Term Goal 1 (Week 1): LTG=STG  Skilled Therapeutic Interventions/Progress Updates:    Pt engaged in BADL retraining with focus on bathing at shower level and dressing with sit<>stand from EOB.  Pt required steady A for standing activities.  Pt noted with increased functional use of LUE/hand and able to pull up underpants and pants without assistance.  Pt requires more than a reasonable amount of time to complete tasks.  Pt amb without AD to ADL apartment and replaced comforter on bed.  No LOB noted and pt completed task without assistance.  Pt returned to room and remained in bed with all needs within reach and bed alarm activated.   Therapy Documentation Precautions:  Precautions Precautions: Fall Precaution Comments: L inattention Restrictions Weight Bearing Restrictions: No   Pain: Pain Assessment Pain Scale: 0-10 Pain Score: 0-No pain Faces Pain Scale: No hurt  See Function Navigator for Current Functional Status.   Therapy/Group: Individual Therapy  Rich Brave 12/25/2017, 12:15 PM

## 2017-12-25 NOTE — Progress Notes (Signed)
Occupational Therapy Note  Patient Details  Name: Heather [name] MRN: [id number] Date of Birth: 16-Mar-1956  Today's Date: 12/25/2017 OT Individual Time: 1400-1430 OT Individual Time Calculation (min): 30 min   Pt denies pain Individual Therapy  Pt sitting EOB upon arrival with interpreter present.  OT intervention with focus on functional amb without AD, safety awareness, and increased use of LUE/hand for functional tasks.  Pt amb without AD to dayroom and stood at hi-lo table to fold towels.  Pt carried towels to gym and placed in dirty linen bags.  Pt used LUE to hold towels.  Pt sat at mat and removed and replaced medicine bottle tops using BUE/hand.  Pt returned to room and remained in bed with bed alarm activated and all needs within reach.    Lavone Neri [facility] 12/25/2017, 2:40 PM

## 2017-12-26 ENCOUNTER — Ambulatory Visit (HOSPITAL_COMMUNITY): Payer: Self-pay

## 2017-12-26 ENCOUNTER — Inpatient Hospital Stay (HOSPITAL_COMMUNITY): Payer: Self-pay

## 2017-12-26 ENCOUNTER — Inpatient Hospital Stay (HOSPITAL_COMMUNITY): Payer: Self-pay | Admitting: Physical Therapy

## 2017-12-26 LAB — GLUCOSE, CAPILLARY
GLUCOSE-CAPILLARY: 166 mg/dL — AB (ref 65–99)
Glucose-Capillary: 146 mg/dL — ABNORMAL HIGH (ref 65–99)
Glucose-Capillary: 97 mg/dL (ref 65–99)
Glucose-Capillary: 117 mg/dL — ABNORMAL HIGH (ref 65–99)

## 2017-12-26 MED ORDER — GLIMEPIRIDE 2 MG PO TABS
1.0000 mg | ORAL_TABLET | Freq: Every day | ORAL | Status: DC
  Administered 2017-12-26 – 2017-12-28 (×3): 1 mg via ORAL
  Filled 2017-12-26 (×3): qty 1

## 2017-12-26 NOTE — Progress Notes (Signed)
Physical Therapy Session Note  Patient Details  Name: Heather [name] MRN: [id number] Date of Birth: Nov 13, 1955  Today's Date: 12/26/2017 PT Individual Time: 1300-1410 PT Individual Time Calculation (min): 70 min   Short Term Goals: Week 1:  PT Short Term Goal 1 (Week 1): =LTGS  Skilled Therapeutic Interventions/Progress Updates:   Pt in supine and agreeable to therapy, denies pain. Has not eaten her lunch but she is agreeable to participating in therapy. Ambulated around unit in 150-200' bouts w/ supervision. Session focused on overall functional balance and endurance. Performed baking task in ADL kitchen involving following multi-step directions, short term memory of ingredients, performing BUE tasks in stance, and safety awareness of oven and other kitchen appliances. Min-mod cues overall for direction following, supervision for dynamic balance tasks at counter. Pt stayed in stance for 30 minutes in total including ambulating to/from ADL kitchen and standing to make muffins and wash dishes. Worked on static stance while on airex pad w/ close supervision while putting together puzzle while muffins were baking. Puzzle emphasizing scanning environment and attending to L side of visual field. Min-mod cues to complete puzzle. Performed NuStep 10 min @ L1 for LE strengthening in reciprocal movement pattern. Returned to room and provided supervision while pt briefly sat EOB to eat lunch, ended session in supine, call bell within reach and all needs met.   Therapy Documentation Precautions:  Precautions Precautions: Fall Precaution Comments: L inattention Restrictions Weight Bearing Restrictions: No Pain: Pain Assessment Pain Score: 0-No pain  See Function Navigator for Current Functional Status.   Therapy/Group: Individual Therapy  Heather [name] 12/26/2017, 2:10 PM

## 2017-12-26 NOTE — Progress Notes (Signed)
Occupational Therapy Note  Patient Details  Name: Lissie Hinesley MRN: [id number] Date of Birth: 1955/11/26  Today's Date: 12/26/2017 OT Individual Time: 1100-1200 OT Individual Time Calculation (min): 60 min   Pt denies pain this morning Individual Therapy  Pt resting in bed upon arrival with interpreter present.  Pt declined bathing/dressing this morning.  OT intervention with focus on functional amb without AD, LU gross motor and FMC tasks to increase independence with BADLs.  Activities/tasks included sequentially gathering numbered discs in hallway and grasping in L hand, stacking tokens with L hand, in hand manipulation tasks with L hand, removing and replacing small beads in theraputty, and tossing ball while walking back to room.  Pt completed all tasks at supervision/steady A with LOB X 1.  Pt returned to bed and remained in bed with NT present.     Lavone Neri [facility] 12/26/2017, 12:04 PM

## 2017-12-26 NOTE — Progress Notes (Signed)
[facility]IC[facility]ILIT[facility]     PROGRESS NOTE    Subjective/Complaints: Patient reports no new problems.  Headaches are improving.  Notes increased strength in left upper extremity  ROS: Patient denies fever, rash, sore throat, blurred vision, nausea, vomiting, diarrhea, cough, shortness of breath or chest pain, joint or back pain, headache, or mood change.   Objective: Vital Signs: Blood pressure 137/88, pulse 81, temperature 97.8 F (36.6 C), temperature source Oral, resp. rate 18, height 5\' 8"  (1.727 m), weight 119.3 kg (263 lb 0.1 oz), SpO2 99 %. No results found. No results for input(s): WBC, HGB, HCT, PLT in the last 72 hours. No results for input(s): NA, K, CL, GLUCOSE, BUN, CREATININE, CALCIUM in the last 72 hours.  Invalid input(s): CO CBG (last 3)  Recent Labs    12/25/17 1635 12/25/17 2108 12/26/17 0649  GLUCAP 153* 144* 146*    Wt Readings from Last 3 Encounters:  12/19/17 119.3 kg (263 lb 0.1 oz)  12/17/17 120.3 kg (265 lb 3.4 oz)  07/27/17 117.1 kg (258 lb 3.2 oz)    Physical Exam:  Constitutional: No distress . Vital signs reviewed. HEENT: EOMI, oral membranes moist Cardiovascular: RRR without murmur. No JVD    Respiratory: CTA Bilaterally without wheezes or rales. Normal effort    GI: BS +, non-tender, non-distended   Musculoskeletal: She exhibits no edema or deformity.  Skin. Warm and dry  Neurological. Alert. Right gaze preference. . Left central 7   With ongoing left inattention---persistent LUE 3+/5 prox to distal.  LLE: 4/5 prox to distal with some difficulties activitating on command.  Pain sense dimished left arm to LT. RUE and RLE grossly 4+ to 5/5 with normal sensory function. No resting tone.  Psych: p Pleasant and cooperative    Assessment/Plan: 1. Functional deficits secondary to right MCA infarct which require 3+ hours per day of interdisciplinary therapy in a comprehensive inpatient rehab setting. Physiatrist is  providing close team supervision and 24 hour management of active medical problems listed below. Physiatrist and rehab team continue to assess barriers to discharge/monitor patient progress toward functional and medical goals.  Function:  Bathing Bathing position   Position: Shower  Bathing parts Body parts bathed by patient: Left arm, Chest, Abdomen, Front perineal area, Right upper leg, Left upper leg, Buttocks, Right arm Body parts bathed by helper: Right lower leg, Left lower leg, Back  Bathing assist Assist Level: Touching or steadying assistance(Pt > 75%)      Upper Body Dressing/Undressing Upper body dressing   What is the patient wearing?: (night gown)     Pull over shirt/dress - Perfomed by patient: Put head through opening, Pull shirt over trunk, Thread/unthread left sleeve, Thread/unthread right sleeve Pull over shirt/dress - Perfomed by helper: Thread/unthread right sleeve, Thread/unthread left sleeve        Upper body assist Assist Level: Supervision or verbal cues      Lower Body Dressing/Undressing Lower body dressing   What is the patient wearing?: Underwear, Pants, Non-skid slipper socks Underwear - Performed by patient: Thread/unthread right underwear leg, Thread/unthread left underwear leg, Pull underwear up/down Underwear - Performed by helper: Pull underwear up/down Pants- Performed by patient: Thread/unthread right pants leg, Thread/unthread left pants leg, Pull pants up/down Pants- Performed by helper: Pull pants up/down Non-skid slipper socks- Performed by patient: Don/doff right sock, Don/doff left sock Non-skid slipper socks- Performed by helper: Don/doff left sock  TED Hose - Performed by helper: Don/doff right TED hose, Don/doff left TED hose  Lower body assist Assist for lower body dressing: Touching or steadying assistance (Pt > 75%)      Toileting Toileting   Toileting steps completed by patient: Adjust clothing prior to  toileting, Performs perineal hygiene, Adjust clothing after toileting Toileting steps completed by helper: Adjust clothing after toileting Toileting Assistive Devices: Grab bar or rail  Toileting assist Assist level: Supervision or verbal cues   Transfers Chair/bed transfer   Chair/bed transfer method: Stand pivot Chair/bed transfer assist level: Supervision or verbal cues       Locomotion Ambulation     Max distance: 300 ft Assist level: Supervision or verbal cues   Wheelchair          Cognition Comprehension Comprehension assist level: Follows basic conversation/direction with extra time/assistive device  Expression Expression assist level: Expresses basic needs/ideas: With extra time/assistive device  Social Interaction Social Interaction assist level: Interacts appropriately with others with medication or extra time (anti-anxiety, antidepressant).  Problem Solving Problem solving assist level: Solves basic 50 - 74% of the time/requires cueing 25 - 49% of the time, Solves basic 75 - 89% of the time/requires cueing 10 - 24% of the time  Memory Memory assist level: Recognizes or recalls 75 - 89% of the time/requires cueing 10 - 24% of the time   Medical Problem List and Plan:  1. 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  Team conference today 2. DVT Prophylaxis/Anticoagulation: Brilinta  3. Pain Management: headaches improving    -Tylenol 3/tylenol for headaches as needed     4. Mood: Provide emotional support  5. Neuropsych: This patient is capable of making decisions on her own behalf.  6. Skin/Wound Care: Routine skin checks  7. Fluids/Electrolytes/Nutrition: encourage PO 8.Dysphagia. Diet adv to D2/thins 9. Tobacco abuse. Provide counseling  10. Hypertension. Permissive hypertension. Monitor with increased mobility    -bp controlled 3/22 11. DM2: hgb A1c 6.7.     -checking CBG's ac and hs   -No ssi    -changed to cm diet    -Begin low-dose Amaryl 1 mg daily 13. Ear wax with some imrprovement--Debrox complete    LOS (Days) 7 A FACE TO FACE EVALUATION WAS PERFORMED  Ranelle Oyster, MD 12/26/2017 8:50 AM

## 2017-12-26 NOTE — Progress Notes (Signed)
Speech Language Pathology Daily Session Note  Patient Details  Name: Heather [name] MRN: [id number] Date of Birth: [birth year]  Today's Date: 12/26/2017 SLP Individual Time: 0830-0930 SLP Individual Time Calculation (min): 60 min  Short Term Goals: Week 1: SLP Short Term Goal 1 (Week 1): Pt will demonstrate selective attention in moderately distracting environment for ~ 45 minutes with supervision cues.  SLP Short Term Goal 2 (Week 1): Pt will demonstrate intellectual awareness by listing 3 physical and 2 cognitive/dysphagia deficits related to acute CVa with supervision cues.  SLP Short Term Goal 3 (Week 1): Pt will scan to left of environment to locate items in 90% of opportunities with supervision cues.  SLP Short Term Goal 4 (Week 1): Pt will consume least restrictive diet with minimal overt s/s of aspiration with supervision cues for use of compensatory swallow strategies.  SLP Short Term Goal 5 (Week 1): Pt will complete semi-complex problem solving tasks with supervision cues.   Skilled Therapeutic Interventions:Skilled ST services focused on swallow and cognitive skills. SLP facilitated PO consumption of dys 3 with breakfast tray, pt demonstrated minimum anterior loss on left and required supervision A verbal cues for swallow strategies. SLP upgraded diet to dys 3 due to improvement in oral sensation evidence by reduced anterior spillage/left pocekting and no overt s/s aspiration. SLP facilitated recall of medication name/funtion/times per day requiring supervision A verbal cues to utilize visual aid. SLP facilitated semi-complex problem solving skills in medication management task , pt required supervision A verbal cues for problem solving and min-supervision A question cues to monitor/correct errors. Interpreter explained recent diagnosis of diabetes requiring once a day dose of anti-diabetic medication and checking sugar level, pt stated understanding, but will need continued education. SLP  facilitated semi-complex problem solving and recall of rules with card game, pt requiring supervision A verbal cues. Pt was left in room with call bell within reach. Reccomend to continue skilled ST services.        Function:  Eating Eating   Modified Consistency Diet: Yes Eating Assist Level: More than reasonable amount of time;Supervision or verbal cues           Cognition Comprehension Comprehension assist level: Follows basic conversation/direction with extra time/assistive device  Expression   Expression assist level: Expresses basic needs/ideas: With extra time/assistive device  Social Interaction Social Interaction assist level: Interacts appropriately with others with medication or extra time (anti-anxiety, antidepressant).  Problem Solving Problem solving assist level: Solves complex 90% of the time/cues < 10% of the time;Solves basic 90% of the time/requires cueing < 10% of the time  Memory Memory assist level: Recognizes or recalls 75 - 89% of the time/requires cueing 10 - 24% of the time    Pain Pain Assessment Pain Scale: 0-10 Pain Score: 0-No pain Pain Type: Acute pain Pain Location: Head Pain Orientation: Right Pain Descriptors / Indicators: Aching Patients Stated Pain Goal: 2 Pain Intervention(s): Medication (See eMAR)  Therapy/Group: Individual Therapy  Christyana Corwin  [facility] 12/26/2017, 12:37 PM

## 2017-12-27 ENCOUNTER — Inpatient Hospital Stay (HOSPITAL_COMMUNITY): Payer: Self-pay

## 2017-12-27 LAB — GLUCOSE, CAPILLARY
GLUCOSE-CAPILLARY: 101 mg/dL — AB (ref 65–99)
Glucose-Capillary: 146 mg/dL — ABNORMAL HIGH (ref 65–99)
Glucose-Capillary: 102 mg/dL — ABNORMAL HIGH (ref 65–99)
Glucose-Capillary: 145 mg/dL — ABNORMAL HIGH (ref 65–99)

## 2017-12-27 MED ORDER — TICAGRELOR 90 MG PO TABS: 90.0000 mg | ORAL_TABLET | Freq: Two times a day (BID) | ORAL | 1 refills | Status: DC

## 2017-12-27 MED ORDER — ASPIRIN 81 MG PO CHEW: 81.0000 mg | CHEWABLE_TABLET | Freq: Every day | ORAL | Status: DC

## 2017-12-27 MED ORDER — ACETAMINOPHEN-CODEINE #3 300-30 MG PO TABS: 1.0000 | ORAL_TABLET | Freq: Four times a day (QID) | ORAL | 0 refills | Status: DC | PRN

## 2017-12-27 MED ORDER — GLIMEPIRIDE 1 MG PO TABS: 1.0000 mg | ORAL_TABLET | Freq: Every day | ORAL | 1 refills | Status: DC

## 2017-12-27 NOTE — Plan of Care (Addendum)
[id number]

## 2017-12-27 NOTE — Progress Notes (Signed)
Speech Language Pathology Discharge Summary  Patient Details  Name: Heather [name] MRN: [id number] Date of Birth: Dec 08, 1955  Today's Date: 12/27/2017 SLP Individual Time: 0830-0930 SLP Individual Time Calculation (min): 60 min   Skilled Therapeutic Interventions:  Skilled treatment session focused on dysphagia, cognition and education specifically targeted at safety awareness within home environment. Pt's daughter was supposed to be at session but never came. CSW is aware. SLP facilitated session by providing skilled observation of pt consuming dysphagia 3 breakfast tray with thin liquids. Pt with 1 instance of left anterior spillage and required superviison cues to correct. Pt with no other instances of increased oral residue. SLP further facilitated session by providing Min A cues to list activities that would be safe to perform within home. Pt states that she will be able to cut salad, tomatoes etc. Education provided that pt is not safe to perform these activities yet. Pt voiced understanding but states that she will try it to make sure. Extensive education provided.     Patient has met 6 of 6 long term goals.  Patient to discharge at overall Supervision level.    Clinical Impression/Discharge Summary:   Pt has made progress in ST sessions and as a result, she has met 6 of 6 LTGs. She currently requires supervision d/t decreased safety awareness, deficits in selective attention and overall left inattention. Therefore, recommend that someone always be present with pt. Family has stated they have made arrangements.    Care Partner:  Caregiver Able to Provide Assistance: Yes  Type of Caregiver Assistance: Cognitive  Recommendation:  24 hour supervision/assistance      Equipment:     Reasons for discharge: Treatment goals met;Discharged from hospital   Patient/Family Agrees with Progress Made and Goals Achieved: Yes   Function:  Eating Eating   Modified Consistency Diet:  Yes Eating Assist Level: Supervision or verbal cues           Cognition Comprehension Comprehension assist level: Follows basic conversation/direction with no assist;Understands complex 90% of the time/cues 10% of the time  Expression   Expression assist level: Expresses basic needs/ideas: With no assist;Expresses complex 90% of the time/cues < 10% of the time  Social Interaction Social Interaction assist level: Interacts appropriately with others - No medications needed.  Problem Solving Problem solving assist level: Solves complex 90% of the time/cues < 10% of the time  Memory Memory assist level: Recognizes or recalls 90% of the time/requires cueing < 10% of the time;More than reasonable amount of time   Marrah Vanevery 12/27/2017, 11:15 AM

## 2017-12-27 NOTE — Progress Notes (Signed)
Physical Therapy Discharge Summary  Patient Details  Name: Heather [name] MRN: [id number] Date of Birth: 1956-02-28  Today's Date: 12/27/2017 PT Individual Time: 1300-1415 PT Individual Time Calculation (min): 75 min   Patient has met 6 of 6 long term goals due to improved activity tolerance, improved balance, increased strength, ability to compensate for deficits, functional use of  left upper extremity and left lower extremity, improved attention and improved awareness.  Patient to discharge at an ambulatory level Supervision.   Patient's care partner is independent to provide the necessary cognitive assistance at discharge.  Reasons goals not met: n/a  Recommendation:  Patient will benefit from ongoing skilled PT services in home health setting to continue to advance safe functional mobility, address ongoing impairments in high level balance, LLE motor control, and minimize fall risk. Pt has a hx of falls.  Equipment: No equipment provided  Reasons for discharge: treatment goals met and discharge from hospital  Patient/family agrees with progress made and goals achieved: Yes  PT Discharge Interpreter Alleen Borne present for tx.   Pt stated she needed to use toilet.  Pt ambulated with supervision without AD to/from toilet for continent BM.    Dtr [name] present for family ed. She participated in and demonstrated understanding of pt's L inattention when ambulating on level surfaces, stairs, ramp, and observed Berg Balance test, room naming on L activity while ambulating and floor clock activity. Dtr stated that pt had some problems on stairs due to knee pain PTA, but pt did not voice any pain with steps this session.  Dtr stated that she has 2 toddlers at home and a small dog; PT advised her that pt may trip or bump into obstacles on her L due to L inattention.  Dtr stated understanding.  Pt handed off to Happi, SLP for next session.    Precautions/Restrictions Precautions Precautions:  Fall Precaution Comments: L inattention Restrictions Weight Bearing Restrictions: No   Pain Pain Assessment Faces Pain Scale: Hurts a little bit Pain Location: Head Pain Orientation: Posterior Pain Intervention(s): Medication (See eMAR) Vision/Perception  Vision - Assessment Eye Alignment: Within Functional Limits Tracking/Visual Pursuits: Decreased smoothness of horizontal tracking Perception Inattention/Neglect: Does not attend to left visual field Praxis Praxis: Intact  Cognition Overall Cognitive Status: Impaired/Different from baseline Arousal/Alertness: Awake/alert Orientation Level: Oriented X4 Attention: Selective Selective Attention: Impaired Selective Attention Impairment: Verbal complex;Functional complex Memory: Appears intact Memory Impairment: Decreased recall of new information Awareness: Impaired Awareness Impairment: Anticipatory impairment Problem Solving: Impaired Problem Solving Impairment: Verbal complex;Functional complex Self Correcting: Impaired Self Correcting Impairment: Verbal complex;Functional complex Safety/Judgment: Impaired   When asked to name room #s on her L during ambulation, pt 100% accurate, however when ambulating in crowded hall or through doorways with another person on her L, pt did not change her trajectory at all, and brushed against obstacles or people on her L, and helper had to step behind her, despite cues.  Sensation Sensation Light Touch: Appears Intact(LEs) Light Touch Impaired Details: Impaired LUE Hot/Cold: Appears Intact Proprioception: Appears Intact Coordination Gross Motor Movements are Fluid and Coordinated: Yes(LLE) Fine Motor Movements are Fluid and Coordinated: Yes Coordination and Movement Description: decr left UE coordination Motor  Motor Motor: Hemiplegia Motor - Discharge Observations: mild  Mobility Bed Mobility Bed Mobility: Sit to Supine;Supine to Sit Supine to Sit: 6: Modified independent  (Device/Increase time) Transfers Sit to Stand: 5: Supervision Sit to Stand Details: Verbal cues for precautions/safety Stand to Sit: 5: Supervision Stand to Sit Details (indicate cue type  and reason): Verbal cues for precautions/safety Locomotion  Ambulation Ambulation: Yes Ambulation/Gait Assistance: 5: Supervision Ambulation Distance (Feet): 200 Feet Assistive device: None Gait Gait: Yes Gait Pattern: Impaired Gait Pattern: Lateral hip instability;Left genu recurvatum Gait velocity: reduced Stairs / Additional Locomotion Stairs: Yes Stairs Assistance: 5: Supervision Stairs Assistance Details: Verbal cues for gait pattern;Visual cues/gestures for sequencing Stair Management Technique: One rail Right;Forwards Number of Stairs: 12 Height of Stairs: 6(and 3) Ramp: 5: Supervision;4: Min assist(supervision ascending, min guard descending) Wheelchair Mobility Wheelchair Mobility: No  Trunk/Postural Assessment  Cervical Assessment Cervical Assessment: Within Functional Limits Thoracic Assessment Thoracic Assessment: Within Functional Limits Lumbar Assessment Lumbar Assessment: Within Functional Limits Postural Control Postural Control: Within Functional Limits  Balance Balance Balance Assessed: Yes Standardized Balance Assessment Standardized Balance Assessment: Berg Balance Test Berg Balance Test Sit to Stand: Able to stand without using hands and stabilize independently Standing Unsupported: Able to stand safely 2 minutes Sitting with Back Unsupported but Feet Supported on Floor or Stool: Able to sit safely and securely 2 minutes Stand to Sit: Sits safely with minimal use of hands Transfers: Able to transfer safely, minor use of hands Standing Unsupported with Eyes Closed: Able to stand 10 seconds with supervision Standing Ubsupported with Feet Together: Able to place feet together independently and stand for 1 minute with supervision From Standing, Reach Forward with  Outstretched Arm: Can reach forward >5 cm safely (2") From Standing Position, Pick up Object from Floor: Able to pick up shoe, needs supervision From Standing Position, Turn to Look Behind Over each Shoulder: Looks behind from both sides and weight shifts well Turn 360 Degrees: Able to turn 360 degrees safely but slowly Standing Unsupported, Alternately Place Feet on Step/Stool: Able to complete >2 steps/needs minimal assist Standing Unsupported, One Foot in Front: Able to take small step independently and hold 30 seconds Standing on One Leg: Tries to lift leg/unable to hold 3 seconds but remains standing independently Total Score: 41 Static Sitting Balance Static Sitting - Balance Support: Feet supported Static Sitting - Level of Assistance: 7: Independent Dynamic Sitting Balance Dynamic Sitting - Balance Support: Feet supported;During functional activity Dynamic Sitting - Level of Assistance: 6: Modified independent (Device/Increase time) Static Standing Balance Static Standing - Balance Support: During functional activity;No upper extremity supported Static Standing - Level of Assistance: 5: Stand by assistance Dynamic Standing Balance Dynamic Standing - Balance Support: No upper extremity supported Dynamic Standing - Level of Assistance: 5: Stand by assistance Dynamic Standing - Balance Activities: Lateral lean/weight shifting;Forward lean/weight shifting   Given external perturbations in all directions, pt demonstrated bil ankle strategy (L delayed), bil hip strategy, and L stepping strategy.  Extremity Assessment  RUE Assessment RUE Assessment: Within Functional Limits LUE Assessment LUE Assessment: Exceptions to [facility] LUE Strength LUE Overall Strength: Deficits LUE Tone LUE Tone: Within Functional Limits RLE Assessment RLE Assessment: Within Functional Limits RLE Strength RLE Overall Strength Comments: grossly in sitting, hip flex/ext 4/5, ankle and knee WFL LLE  Assessment LLE Assessment: Within Functional Limits LLE Strength LLE Overall Strength Comments: grossly in sitting, grossly 4/5 hip flex/ext; ankle and knee WFL with L single limb stance or with trunk rotation to L, pt hyperextends L knee   See Function Navigator for Current Functional Status.  Nathian Stencil 12/27/2017, 2:36 PM

## 2017-12-27 NOTE — Progress Notes (Signed)
[place] PHYSICAL MEDICINE & REHABILITATION     PROGRESS NOTE    Subjective/Complaints: No new issues. Sleeping well. Making progress in therapies.   ROS: Patient denies fever, rash, sore throat, blurred vision, nausea, vomiting, diarrhea, cough, shortness of breath or chest pain, joint or back pain, headache, or mood change.    Objective: Vital Signs: Blood pressure 140/90, pulse 68, temperature 98.9 F (37.2 C), temperature source Oral, resp. rate 16, height 5\' 8"  (1.727 m), weight 119 kg (262 lb 5.6 oz), SpO2 99 %. No results found. No results for input(s): WBC, HGB, HCT, PLT in the last 72 hours. No results for input(s): NA, K, CL, GLUCOSE, BUN, CREATININE, CALCIUM in the last 72 hours.  Invalid input(s): CO CBG (last 3)  Recent Labs    12/26/17 1638 12/26/17 2103 12/27/17 0656  GLUCAP 97 117* 146*    Wt Readings from Last 3 Encounters:  12/27/17 119 kg (262 lb 5.6 oz)  12/17/17 120.3 kg (265 lb 3.4 oz)  07/27/17 117.1 kg (258 lb 3.2 oz)    Physical Exam:  Constitutional: No distress . Vital signs reviewed. HEENT: EOMI, oral membranes moist Cardiovascular: RRR without murmur. No JVD    Respiratory: CTA Bilaterally without wheezes or rales. Normal effort    GI: BS +, non-tender, non-distended   Musculoskeletal: She exhibits no edema or deformity.  Skin. Warm and dry  Neurological. Alert. Right gaze preference. . Left central 7   With ongoing left inattention---persistent LUE 3+ to 4-/5 prox to distal.  LLE: 4/5 prox to distal and more automatic.  Pain sense dimished left arm to LT but able to sense pain/LT. RUE and RLE grossly 4+ to 5/5 with normal sensory function. No resting tone.  Psych: pleasant    Assessment/Plan: 1. Functional deficits secondary to right MCA infarct which require 3+ hours per day of interdisciplinary therapy in a comprehensive inpatient rehab setting. Physiatrist is providing close team supervision and 24 hour management of active medical  problems listed below. Physiatrist and rehab team continue to assess barriers to discharge/monitor patient progress toward functional and medical goals.  Function:  Bathing Bathing position   Position: Shower  Bathing parts Body parts bathed by patient: Left arm, Chest, Abdomen, Front perineal area, Right upper leg, Left upper leg, Buttocks, Right arm Body parts bathed by helper: Right lower leg, Left lower leg, Back  Bathing assist Assist Level: Touching or steadying assistance(Pt > 75%)      Upper Body Dressing/Undressing Upper body dressing   What is the patient wearing?: (night gown)     Pull over shirt/dress - Perfomed by patient: Put head through opening, Pull shirt over trunk, Thread/unthread left sleeve, Thread/unthread right sleeve Pull over shirt/dress - Perfomed by helper: Thread/unthread right sleeve, Thread/unthread left sleeve        Upper body assist Assist Level: Supervision or verbal cues      Lower Body Dressing/Undressing Lower body dressing   What is the patient wearing?: Underwear, Pants, Non-skid slipper socks Underwear - Performed by patient: Thread/unthread right underwear leg, Thread/unthread left underwear leg, Pull underwear up/down Underwear - Performed by helper: Pull underwear up/down Pants- Performed by patient: Thread/unthread right pants leg, Thread/unthread left pants leg, Pull pants up/down Pants- Performed by helper: Pull pants up/down Non-skid slipper socks- Performed by patient: Don/doff right sock, Don/doff left sock Non-skid slipper socks- Performed by helper: Don/doff left sock               TED Hose - Performed  by helper: Don/doff right TED hose, Don/doff left TED hose  Lower body assist Assist for lower body dressing: Touching or steadying assistance (Pt > 75%)      Toileting Toileting   Toileting steps completed by patient: Adjust clothing prior to toileting, Performs perineal hygiene, Adjust clothing after  toileting Toileting steps completed by helper: Adjust clothing after toileting Toileting Assistive Devices: Grab bar or rail  Toileting assist Assist level: Supervision or verbal cues   Transfers Chair/bed transfer   Chair/bed transfer method: Ambulatory Chair/bed transfer assist level: Supervision or verbal cues       Locomotion Ambulation     Max distance: 200' Assist level: Supervision or verbal cues   Wheelchair          Cognition Comprehension Comprehension assist level: Follows basic conversation/direction with extra time/assistive device  Expression Expression assist level: Expresses basic needs/ideas: With extra time/assistive device  Social Interaction Social Interaction assist level: Interacts appropriately with others - No medications needed.  Problem Solving Problem solving assist level: Solves complex 90% of the time/cues < 10% of the time  Memory Memory assist level: Recognizes or recalls 75 - 89% of the time/requires cueing 10 - 24% of the time   Medical Problem List and Plan:  1. 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   -progressing toward DC 3/28 -Patient to see Rehab MD/provider in the office for transitional care encounter in 1-2 weeks.  2. DVT Prophylaxis/Anticoagulation: Brilinta  3. Pain Management: headaches improving    -Tylenol 3/tylenol for headaches as needed     4. Mood: Provide emotional support  5. Neuropsych: This patient is capable of making decisions on her own behalf.  6. Skin/Wound Care: Routine skin checks  7. Fluids/Electrolytes/Nutrition: encourage PO 8.Dysphagia. Diet adv to D2/thins 9. Tobacco abuse. Provide counseling  10. Hypertension. Permissive hypertension. Monitor with increased mobility    -bp controlled with fair control 3/27 11. DM2: hgb A1c 6.7.     -checking CBG's ac and hs   -No ssi   -changed to cm diet    -Began low-dose Amaryl 1 mg daily with  improvement   -will need to check sugars at home 13. Ear wax  --Debrox complete    LOS (Days) 8 A FACE TO FACE EVALUATION WAS PERFORMED  Ranelle Oyster, MD 12/27/2017 8:41 AM

## 2017-12-27 NOTE — Progress Notes (Signed)
Speech Language Pathology Daily Session Note  Patient Details  Name: Heather [name] MRN: [id number] Date of Birth: 04-22-1956  Today's Date: 12/27/2017 SLP Individual Time: 3546-5681 SLP Individual Time Calculation (min): 23 min  Short Term Goals: Week 1: SLP Short Term Goal 1 (Week 1): Pt will demonstrate selective attention in moderately distracting environment for ~ 45 minutes with supervision cues.  SLP Short Term Goal 2 (Week 1): Pt will demonstrate intellectual awareness by listing 3 physical and 2 cognitive/dysphagia deficits related to acute CVa with supervision cues.  SLP Short Term Goal 3 (Week 1): Pt will scan to left of environment to locate items in 90% of opportunities with supervision cues.  SLP Short Term Goal 4 (Week 1): Pt will consume least restrictive diet with minimal overt s/s of aspiration with supervision cues for use of compensatory swallow strategies.  SLP Short Term Goal 5 (Week 1): Pt will complete semi-complex problem solving tasks with supervision cues.   Skilled Therapeutic Interventions: Pt's daughter was not present for caregiver education during previous ST session. Pt's daughter now at facility and SLP met with pt and daughter to go over safety awareness. SLP provided education on left inattention and fact that this makes pt a higher fall risk. SLP gave examples of how left inattention can display itself within home environment during ambulation, attempts to cook or take care of smaller children. Education provided to pt and her daughter that pt shouldn't attempt to cut food items d/t left hand weakness. Daughter concerned with pt's alcohol consumption. Basically, I recommended that alcohol not be brought into house if pt has difficulty controlling consumption. Daughter states that pt drinks to point of drunkenness and frequently falls. Education provided. This Probation officer also provided information about stroke symptoms - daughter states that pt enjoys the attention she  gets from reporting symptoms of sickness. Despite this, I recommend that daughter call EMS should pt exhibit any s/s of stroke and allow EMS to differentiate symptoms. Pt and daughter left with interpreter.      Function:  Eating Eating   Modified Consistency Diet: Yes Eating Assist Level: Supervision or verbal cues           Cognition Comprehension Comprehension assist level: Follows basic conversation/direction with no assist;Understands complex 90% of the time/cues 10% of the time  Expression   Expression assist level: Expresses basic needs/ideas: With no assist;Expresses complex 90% of the time/cues < 10% of the time  Social Interaction Social Interaction assist level: Interacts appropriately with others - No medications needed.  Problem Solving Problem solving assist level: Solves complex 90% of the time/cues < 10% of the time  Memory Memory assist level: Recognizes or recalls 90% of the time/requires cueing < 10% of the time    Pain Pain Assessment Faces Pain Scale: Hurts a little bit Pain Location: Head Pain Orientation: Posterior Pain Intervention(s): Medication (See eMAR)  Therapy/Group: Individual Therapy  Heather [name] 12/27/2017, 3:06 PM

## 2017-12-27 NOTE — Progress Notes (Signed)
Occupational Therapy Session Note  Patient Details  Name: Heather [name] MRN: [id number] Date of Birth: 1955-10-14  Today's Date: 12/27/2017 OT Individual Time: 1100-1200 OT Individual Time Calculation (min): 60 min    Short Term Goals: Week 1:  OT Short Term Goal 1 (Week 1): LTG=STG  Skilled Therapeutic Interventions/Progress Updates:    OT intervention with focus on BADL retraining, functional transfers, safety awareness and family education. Pt's daughter present along with interpreter.  Pt's daughter provided supervision with bathing/dressing tasks-bathing at shower level and dressing with sit<>stand from EOB.  Pt amb to tub room and practiced tub transfers without use of rails.  Pt's daughter verbalized understanding of recommendation for 24 hour supervision.  Pt's daughter provided appropriate level of supervision.  Pt returned to bed with all needs within reach, bed alarm activated, and daughter present.   Therapy Documentation Precautions:  Precautions Precautions: Fall Precaution Comments: L inattention Restrictions Weight Bearing Restrictions: No   Pain: Pt denies pain See Function Navigator for Current Functional Status.   Therapy/Group: Individual Therapy  Rich Brave 12/27/2017, 12:10 PM

## 2017-12-27 NOTE — Discharge Summary (Signed)
NAME:  Heather [name], Heather [name]                     ACCOUNT NO.:  MEDICAL RECORD NO.:  [id number]  LOCATION:                                 FACILITY:  PHYSICIAN:  Ranelle Oyster, M.D.DATE OF BIRTH:  [birth year]  DATE OF ADMISSION:  12/19/2017 DATE OF DISCHARGE:  12/28/2017                              DISCHARGE SUMMARY   DISCHARGE DIAGNOSES: 1. Right middle cerebral artery infarction. 2. Deep vein thrombosis prophylaxis with Brilinta. 3. Pain management. 4. Dysphagia. 5. Tobacco abuse. 6. Hypertension. 7. Type 2 diabetes mellitus.  HISTORY OF PRESENT ILLNESS:  This is a 62 year old right-handed Spanish- speaking female with history of hypertension, tobacco abuse, on no scheduled medication, who lives with her granddaughter, independent prior to admission.  Presented on December 17, 2016, with left-sided weakness, headache and dysarthria.  Blood pressure 157/112.  CT of the head showed right MCA territory infarction, no associated hemorrhage. Mild regional mass effect.  No midline shift.  The patient did not receive tPA.  CT angiogram of head and neck showed abrupt proximal right M2 occlusion, underwent mechanical thrombectomy per Interventional Radiology.  Maintained on aspirin as well as Brilinta for CVA prophylaxis.  Echocardiogram completed, that showed ejection fraction of 70%, grade 1 diastolic dysfunction.  Close monitoring of blood pressure. Dysphagia, #1 nectar thick liquid diet.  Physical and occupational therapy ongoing.  The patient was admitted for a comprehensive rehab program.  PAST MEDICAL HISTORY:  See discharge diagnoses.  SOCIAL HISTORY:  Lives with granddaughter, independent prior to admission.  FUNCTIONAL STATUS:  Upon admission to Rehab Services, minimal guard, 100 feet without assistive device; minimal guard, sit to stand; min-to-mod assist, activities of daily living.  PHYSICAL EXAMINATION:  VITAL SIGNS:  Blood pressure 134/80, pulse 70, temperature 98, and  respirations 18. GENERAL:  Alert female, she had a right gaze preference. HEENT:  Pupils, round and reactive to light. NECK:  Supple.  Nontender.  No JVD. CARDIAC:  Rate controlled. ABDOMEN:  Soft, nontender.  Good bowel sounds. LUNGS:  Clear to auscultation without wheeze.  REHABILITATION HOSPITAL COURSE:  The patient was admitted to Inpatient Rehab Services with therapies initiated on a 3-hour daily basis consisting of physical therapy, occupational therapy, speech therapy and rehabilitation nursing.  The following issues were addressed during the patient's rehabilitation stay.  Pertaining to Mrs. [name], right MCA infarction remained stable, maintained on aspirin as well as Brilinta. She would follow up with Neurology Services.  The patient had also undergone mechanical thrombectomy for proximal right M2 occlusion per Interventional Radiology.  Blood pressure was controlled and monitored with no current antihypertensive medications.  Findings of hemoglobin A1c of 6.7, changed to a carb-modified diet, started on a low-dose Amaryl 1 mg daily with full diabetic teaching.  Her diet had been advanced to a mechanical soft.  She did have a history of tobacco abuse, receiving full counseling in regard to cessation of nicotine products. Intermittent bouts of headaches, improved with the use of Tylenol No. 3. The patient received weekly collaborative interdisciplinary team conferences to discuss estimated length of stay, family teaching, any barriers to her discharge.  She was ambulating 200 feet supervision, sessions focused on  overall functional balance and endurance, performed baking tasks, ADL kitchen involving following multistep directions, short-term memory of ingredients, performed bilateral upper extremity tasks independently, she could gather her belongings for activities of daily living and homemaking and communicator, full needs with the assistance of an interpreter.  Full family  teaching was completed and plan discharge to home.  DISCHARGE MEDICATIONS: 1. Aspirin 81 mg p.o. daily. 2. Amaryl 1 mg p.o. daily. 3. Brilinta 90 mg p.o. b.i.d. 4. Tylenol No. 3 one tablet every 6 hours as needed pain.  DIET:  Mechanical soft, diabetic diet.  She would follow up with Dr. Faith Rogue at the Outpatient Rehab Service office as advised; Dr. Arrie Senate, medical management; Dr. Delia Heady, 6 weeks, call for appointment; Interventional Radiology as needed.  SPECIAL INSTRUCTIONS:  No driving.  No smoking.  Continue diabetic diet.     Mariam Dollar, P.A.   ______________________________ Ranelle Oyster, M.D.    DA/MEDQ  D:  12/27/2017  T:  12/27/2017  Job:  [id number]  cc:   Pramod P. Pearlean Brownie, MD Oren Beckmann. Jenelle Mages, FNP-BC Ranelle Oyster, M.D.

## 2017-12-27 NOTE — Discharge Summary (Signed)
Discharge summary job [id number]

## 2017-12-27 NOTE — Discharge Instructions (Signed)
Inpatient Rehab Discharge Instructions  Heather [name] Discharge date and time: No discharge date for patient encounter.   Activities/Precautions/ Functional Status: Activity: activity as tolerated Diet: Mechanical soft diabetic diet Wound Care: none needed Functional status:  ___ No restrictions     ___ Walk up steps independently ___ 24/7 supervision/assistance   ___ Walk up steps with assistance ___ Intermittent supervision/assistance  ___ Bathe/dress independently ___ Walk with walker     _x STROKE/TIA DISCHARGE INSTRUCTIONS SMOKING Cigarette smoking nearly doubles your risk of having a stroke & is the single most alterable risk factor  If you smoke or have smoked in the last 12 months, you are advised to quit smoking for your health.  Most of the excess cardiovascular risk related to smoking disappears within a year of stopping.  Ask you doctor about anti-smoking medications  [place] Quit Line: 1-800-QUIT NOW  Free Smoking Cessation Classes [phone]  CHOLESTEROL Know your levels; limit fat & cholesterol in your diet  Lipid Panel     Component Value Date/Time   CHOL 115 12/18/2017 0405   TRIG 55 12/18/2017 0405   HDL 37 (L) 12/18/2017 0405   CHOLHDL 3.1 12/18/2017 0405   VLDL 11 12/18/2017 0405   LDLCALC 67 12/18/2017 0405      Many patients benefit from treatment even if their cholesterol is at goal.  Goal: Total Cholesterol (CHOL) less than 160  Goal:  Triglycerides (TRIG) less than 150  Goal:  HDL greater than 40  Goal:  LDL (LDLCALC) less than 100   BLOOD PRESSURE American Stroke Association blood pressure target is less that 120/80 mm/Hg  Your discharge blood pressure is:  BP: 137/88  Monitor your blood pressure  Limit your salt and alcohol intake  Many individuals will require more than one medication for high blood pressure  DIABETES (A1c is a blood sugar average for last 3 months) Goal HGBA1c is under 7% (HBGA1c is blood sugar average for last 3 months)   Diabetes:     Lab Results  Component Value Date   HGBA1C 6.7 (H) 12/18/2017     Your HGBA1c can be lowered with medications, healthy diet, and exercise.  Check your blood sugar as directed by your physician  Call your physician if you experience unexplained or low blood sugars.  PHYSICAL ACTIVITY/REHABILITATION Goal is 30 minutes at least 4 days per week  Activity: Increase activity slowly, Therapies: Physical Therapy: Home Health Return to work:   Activity decreases your risk of heart attack and stroke and makes your heart stronger.  It helps control your weight and blood pressure; helps you relax and can improve your mood.  Participate in a regular exercise program.  Talk with your doctor about the best form of exercise for you (dancing, walking, swimming, cycling).  DIET/WEIGHT Goal is to maintain a healthy weight  Your discharge diet is: Fall precautions DIET DYS 2 Room service appropriate? Yes; Fluid consistency: Thin  liquids Your height is:  Height: 5\' 8"  (172.7 cm) Your current weight is: Weight: 119.3 kg (263 lb 0.1 oz) Your Body Mass Index (BMI) is:  BMI (Calculated): 40  Following the type of diet specifically designed for you will help prevent another stroke.  Your goal weight range is:    Your goal Body Mass Index (BMI) is 19-24.  Healthy food habits can help reduce 3 risk factors for stroke:  High cholesterol, hypertension, and excess weight.  RESOURCES Stroke/Support Group:  Call [phone]   STROKE EDUCATION PROVIDED/REVIEWED AND GIVEN  TO PATIENT Stroke warning signs and symptoms How to activate emergency medical system (call 911). Medications prescribed at discharge. Need for follow-up after discharge. Personal risk factors for stroke. Pneumonia vaccine given:  Flu vaccine given:  My questions have been answered, the writing is legible, and I understand these instructions.  I will adhere to these goals & educational materials that have been provided to  me after my discharge from the hospital.   __ Bathe/dress with assistance ___ Walk Independently    ___ Shower independently ___ Walk with assistance    ___ Shower with assistance ___ No alcohol     ___ Return to work/school ________  COMMUNITY REFERRALS UPON DISCHARGE:   Home Health:   PT     OT     ST     Agency:  [facility] Phone:  [phone] Medical Equipment/Items Ordered:  Shower seat with back  GENERAL COMMUNITY RESOURCES FOR PATIENT/FAMILY: Support Groups:  [facility] Stroke Support Group                              Meets the second Thursday of each month from 3-4 PM (except June, July, and August)                              In the dayroom of [facility] at [facility]                              For more information, call [phone] or email [email or web address]  Special Instructions:  No driving or smoking  My questions have been answered and I understand these instructions. I will adhere to these goals and the provided educational materials after my discharge from the hospital.  Patient/Caregiver Signature _______________________________ Date __________  Clinician Signature _______________________________________ Date __________  Please bring this form and your medication list with you to all your follow-up doctor's appointments.

## 2017-12-27 NOTE — Progress Notes (Signed)
Occupational Therapy Discharge Summary  Patient Details  Name: Heather [name] MRN: [id number] Date of Birth: 1956/06/02  Patient has met 34 of 14 long term goals due to improved activity tolerance, improved balance, postural control, ability to compensate for deficits, functional use of  LEFT upper and LEFT lower extremity, improved attention, improved awareness and improved coordination.  Pt made excellent progress with BADLs, safety awareness, and LUE functional use during this admission.  Pt uses LUE at diminished level in all functional tasks.  Pt performs all BADLs and functional transfers at supervision level.  Pt's daughter present for therapy and provides appropriate level of supervision. Patient to discharge at overall Supervision level.  Patient's care partner is independent to provide the necessary physical and cognitive assistance at discharge.      Recommendation:  Patient will benefit from ongoing skilled OT services in home health setting to continue to advance functional skills in the area of BADL and Reduce care partner burden.  Equipment: tub seat  Reasons for discharge: treatment goals met and discharge from hospital  Patient/family agrees with progress made and goals achieved: Yes  OT Discharge Vision Baseline Vision/History: No visual deficits Patient Visual Report: No change from baseline Vision Assessment?: No apparent visual deficits Perception  Inattention/Neglect: Appears intact Praxis Praxis: Intact Cognition Overall Cognitive Status: Impaired/Different from baseline Arousal/Alertness: Awake/alert Orientation Level: Oriented X4 Attention: Selective Selective Attention: Impaired Selective Attention Impairment: Verbal complex;Functional complex Memory: Appears intact Memory Impairment: Decreased recall of new information Awareness: Impaired Awareness Impairment: Anticipatory impairment Problem Solving: Impaired Problem Solving Impairment: Verbal  complex;Functional complex Self Correcting: Impaired Self Correcting Impairment: Verbal complex;Functional complex Safety/Judgment: Impaired Sensation Sensation Light Touch: Appears Intact Light Touch Impaired Details: Impaired LUE Hot/Cold: Appears Intact Proprioception: Appears Intact Coordination Gross Motor Movements are Fluid and Coordinated: Yes Fine Motor Movements are Fluid and Coordinated: No Coordination and Movement Description: decr left UE coordination Motor  Motor Motor: Hemiplegia     Trunk/Postural Assessment  Cervical Assessment Cervical Assessment: Within Functional Limits Thoracic Assessment Thoracic Assessment: Within Functional Limits Lumbar Assessment Lumbar Assessment: Within Functional Limits Postural Control Postural Control: Within Functional Limits  Balance Static Sitting Balance Static Sitting - Balance Support: Feet supported Static Sitting - Level of Assistance: 5: Stand by assistance Dynamic Sitting Balance Dynamic Sitting - Balance Support: Feet supported;During functional activity Dynamic Sitting - Level of Assistance: 5: Stand by assistance Extremity/Trunk Assessment RUE Assessment RUE Assessment: Within Functional Limits LUE Assessment LUE Assessment: Exceptions to [facility] LUE Strength LUE Overall Strength: Deficits LUE Tone LUE Tone: Within Functional Limits   See Function Navigator for Current Functional Status.  Leotis Shames [facility] 12/27/2017, 12:18 PM

## 2017-12-28 LAB — GLUCOSE, CAPILLARY: Glucose-Capillary: 154 mg/dL — ABNORMAL HIGH (ref 65–99)

## 2017-12-28 NOTE — Progress Notes (Signed)
PHYSICAL MEDICINE & REHABILITATION     PROGRESS NOTE    Subjective/Complaints: In good spirits. No problems overnight  ROS: Patient denies fever, rash, sore throat, blurred vision, nausea, vomiting, diarrhea, cough, shortness of breath or chest pain, joint or back pain, headache, or mood change.   Objective: Vital Signs: Blood pressure 122/88, pulse 86, temperature 97.6 F (36.4 C), temperature source Oral, resp. rate 16, height 5\' 8"  (1.727 m), weight 119 kg (262 lb 5.6 oz), SpO2 100 %. No results found. No results for input(s): WBC, HGB, HCT, PLT in the last 72 hours. No results for input(s): NA, K, CL, GLUCOSE, BUN, CREATININE, CALCIUM in the last 72 hours.  Invalid input(s): CO CBG (last 3)  Recent Labs    12/27/17 1637 12/27/17 2104 12/28/17 0628  GLUCAP 101* 145* 154*    Wt Readings from Last 3 Encounters:  12/27/17 119 kg (262 lb 5.6 oz)  12/17/17 120.3 kg (265 lb 3.4 oz)  07/27/17 117.1 kg (258 lb 3.2 oz)    Physical Exam:  Constitutional: No distress . Vital signs reviewed. HEENT: EOMI, oral membranes moist Cardiovascular: RRR without murmur. No JVD    Respiratory: CTA Bilaterally without wheezes or rales. Normal effort    GI: BS +, non-tender, non-distended      Musculoskeletal: She exhibits no edema or deformity.  Skin. Warm and dry  Neurological. Alert. Right gaze preference. . Left central 7   With ongoing left inattention---persistent LUE 3+ to 4-/5 prox to distal.  LLE: 4/5 prox to distal and more automatic.  Motor/sensory stable Psych: pleasant    Assessment/Plan: 1. Functional deficits secondary to right MCA infarct which require 3+ hours per day of interdisciplinary therapy in a comprehensive inpatient rehab setting. Physiatrist is providing close team supervision and 24 hour management of active medical problems listed below. Physiatrist and rehab team continue to assess barriers to discharge/monitor patient progress toward functional  and medical goals.  Function:  Bathing Bathing position   Position: Shower  Bathing parts Body parts bathed by patient: Left arm, Chest, Abdomen, Front perineal area, Right upper leg, Left upper leg, Buttocks, Right arm, Right lower leg, Left lower leg, Back Body parts bathed by helper: Right lower leg, Left lower leg, Back  Bathing assist Assist Level: Supervision or verbal cues      Upper Body Dressing/Undressing Upper body dressing   What is the patient wearing?: Pull over shirt/dress     Pull over shirt/dress - Perfomed by patient: Put head through opening, Pull shirt over trunk, Thread/unthread left sleeve, Thread/unthread right sleeve Pull over shirt/dress - Perfomed by helper: Thread/unthread right sleeve, Thread/unthread left sleeve        Upper body assist Assist Level: Supervision or verbal cues      Lower Body Dressing/Undressing Lower body dressing   What is the patient wearing?: Underwear, Pants, Non-skid slipper socks Underwear - Performed by patient: Thread/unthread right underwear leg, Thread/unthread left underwear leg, Pull underwear up/down Underwear - Performed by helper: Pull underwear up/down Pants- Performed by patient: Thread/unthread right pants leg, Thread/unthread left pants leg, Pull pants up/down Pants- Performed by helper: Pull pants up/down Non-skid slipper socks- Performed by patient: Don/doff right sock, Don/doff left sock Non-skid slipper socks- Performed by helper: Don/doff left sock               TED Hose - Performed by helper: Don/doff right TED hose, Don/doff left TED hose  Lower body assist Assist for lower body dressing: Supervision or  verbal cues      Toileting Toileting   Toileting steps completed by patient: Adjust clothing prior to toileting, Performs perineal hygiene, Adjust clothing after toileting Toileting steps completed by helper: Adjust clothing after toileting Toileting Assistive Devices: Grab bar or rail  Toileting  assist Assist level: Supervision or verbal cues   Transfers Chair/bed transfer   Chair/bed transfer method: Ambulatory Chair/bed transfer assist level: Supervision or verbal cues       Locomotion Ambulation     Max distance: 250 Assist level: Supervision or verbal cues   Wheelchair          Cognition Comprehension Comprehension assist level: Follows basic conversation/direction with no assist, Understands complex 90% of the time/cues 10% of the time  Expression Expression assist level: Expresses basic needs/ideas: With no assist, Expresses complex 90% of the time/cues < 10% of the time  Social Interaction Social Interaction assist level: Interacts appropriately with others - No medications needed.  Problem Solving Problem solving assist level: Solves complex 90% of the time/cues < 10% of the time  Memory Memory assist level: Recognizes or recalls 90% of the time/requires cueing < 10% of the time   Medical Problem List and Plan:  1. 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   -progressing toward DC home today -Patient to see Rehab MD/provider in the office for transitional care encounter in 1-2 weeks.  2. DVT Prophylaxis/Anticoagulation: Brilinta  3. Pain Management: headaches improving    -Tylenol 3/tylenol for headaches as needed     4. Mood: Provide emotional support  5. Neuropsych: This patient is capable of making decisions on her own behalf.  6. Skin/Wound Care: Routine skin checks  7. Fluids/Electrolytes/Nutrition: encourage PO 8.Dysphagia. Diet adv to D2/thins 9. Tobacco abuse. Provide counseling  10. Hypertension. Permissive hypertension. Monitor with increased mobility    -bp controlled with fair control 3/27 11. DM2: hgb A1c 6.7.     -  cm diet    -continue low-dose Amaryl 1 mg daily     -will need to check sugars at home, can check staggered/BID 13. Ear wax  --Debrox complete    LOS (Days) 9 A  FACE TO FACE EVALUATION WAS PERFORMED  Ranelle Oyster, MD 12/28/2017 8:22 AM

## 2017-12-28 NOTE — Progress Notes (Signed)
Social Work Discharge Note  The overall goal for the admission was met for:   Discharge location: Yes - to dtr's home with family providing 24/7 supervision  Length of Stay: Yes - 9 days  Discharge activity level: Yes - supervision  Home/community participation: Yes  Services provided included: MD, RD, PT, OT, SLP, RN, Pharmacy and SW  Financial Services: Other: self pay  Follow-up services arranged: Home Health: PT/OT/ST from [place], DME: shower seat - family to obtain and Patient/Family has no preference for HH/DME agencies  Comments (or additional information):  Dtr came to family education and to take pt home.  She said that pt will have 24/7 supervision from various family members.  F/U at [facility]ness for post-hospital visit and medications.  Patient/Family verbalized understanding of follow-up arrangements: Yes  Individual responsible for coordination of the follow-up plan: pt's dtr and granddtr  Confirmed correct DME delivered: Trey Sailors 12/28/2017    Yamen Castrogiovanni, Silvestre Mesi

## 2017-12-28 NOTE — Progress Notes (Signed)
Social Work Patient ID: Tanette Chauca, female   DOB: 01-Mar-1956, 62 y.o.   MRN: [id number]   CSW met with pt and pt's dtr 12-27-17 to update them on team conference discussion and talk about 12-28-17 d/c date.  Pt was pleased and feels ready to go home.  Going to dtr's home with family coming to her for 24/7 supervision.  Pt will have [place] for PT/OT/ST from [place] and shower seat recommended by therapists - family to obtain.  Pt goes to [place] for PCP and prescriptions.  Dtr to get these on 12-27-17 in anticipation of pt's d/c on 12-28-17.  Pt declined having interpreter on day of d/c.  Dtr to pick her up and she speaks [place] and pt is comfortable with this.   CSW remains available to assist as needed.

## 2017-12-28 NOTE — Patient Care Conference (Signed)
Inpatient RehabilitationTeam Conference and Plan of Care Update Date: 12/27/2017   Time: 2:15 PM    Patient Name: Heather [name]      Medical Record Number: [id number]  Date of Birth: [birth year] Sex: Female         Room/Bed: 4W08C/4W08C-01 Payor Info: Payor: MEDICAID POTENTIAL / Plan: MEDICAID POTENTIAL / Product Type: *No Product type* /    Admitting Diagnosis: R CVA  Admit Date/Time:  12/19/2017  4:43 PM Admission Comments: No comment available   Primary Diagnosis:  <principal problem not specified> Principal Problem: <principal problem not specified>  Patient Active Problem List   Diagnosis Date Noted  . Diabetes mellitus type 2 in obese (HCC) 12/20/2017  . Right middle cerebral artery stroke (HCC) 12/19/2017  . Left hemiparesis (HCC)   . Dysphagia, oropharyngeal   . Middle cerebral artery embolism, 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  . Acute right arterial ischemic stroke, middle cerebral artery (MCA) (HCC) 12/17/2017    Expected Discharge Date: Expected Discharge Date: 12/28/17  Team Members Present: Physician leading conference: Dr. Faith Rogue Social Worker Present: Amada Jupiter, LCSW Nurse Present: Kennon Portela, RN PT Present: Alyson Reedy, PT OT Present: Ardis Rowan, COTA;Lisa Blakeman Katrinka Blazing, OT SLP Present: Colin Benton, SLP PPS Coordinator present : Tora Duck, RN, CRRN     Current Status/Progress Goal Weekly Team Focus  Medical   Admitted with right MCA infarct and left hemiparesis upper involved more than lower.  Undiagnosed diabetes as well.  Increase balance and activity tolerance  Post stroke education and management of diabetes   Bowel/Bladder   cont of B & B  remain cont of B&B alos cont to call appropriately to use the bathroom   monitor pt for need to use the rstrooom   Swallow/Nutrition/ Hydration   dys 3 and thin,upgraded today,supervision A swallow strategies  Supervison A  tolerance of soild upgrade, continue soild upgrade and swallow startegies (left  pocketing/anterior loss)   ADL's   bathing-steady A; LB dressing-min A; functional transfers-steady A; LUE at diminished functional level  supervision overall  standing balance, functional transfers, safety awareness, education   Mobility   Supervision for transfers and gait with no AD, CGA for stairs with one handrail  Supervision for all mobility  balance, attention to the L   Communication             Safety/Cognition/ Behavioral Observations  Min - Supervision A  Supervision A  semi-complex problem solving, recall, selective attention, error awareness in fucntional tasks   Pain   occasional C/O head pain.  cont to have pts pain controlled with currennt rescribed pain meds  monitor ptpain q shift and Prn    Skin   No current skin issues  maintain Pt integrity of Skin   monitor pt skin and  Protect  any areas with  Approved pain regimine    Rehab Goals Patient on target to meet rehab goals: Yes Rehab Goals Revised: none *See Care Plan and progress notes for long and short-term goals.     Barriers to Discharge  Current Status/Progress Possible Resolutions Date Resolved   Physician    Medical stability        Manage medical issues as outlined above and in the chart      Nursing                  PT        pt at Supervision level for mobility without AD, CGA on stairs with one handrail  OT                  SLP                SW                Discharge Planning/Teaching Needs:  Pt to go to her dtr's home due to no stairs to bedroom/bathroom and will have family come to her for 24/7 supervision.  Family has participated in therapies and education scheduled for 12-27-17.  Team Discussion:  Pt is doing well medically.  Family trained on diabetes management.  Pt is continent.  Diet has been upgraded to D3 and pt is supervision to mod I with meals.  Pt is min A - supervision for cognition.  Poor working memory, but improved attention.  Pt's hand movement is much  improved, as well.  Pt's goals are supervision and she is close to that.  Revisions to Treatment Plan:  none    Continued Need for Acute Rehabilitation Level of Care: The patient requires daily medical management by a physician with specialized training in physical medicine and rehabilitation for the following conditions: Daily direction of a multidisciplinary physical rehabilitation program to ensure safe treatment while eliciting the highest outcome that is of practical value to the patient.: Yes Daily medical management of patient stability for increased activity during participation in an intensive rehabilitation regime.: Yes Daily analysis of laboratory values and/or radiology reports with any subsequent need for medication adjustment of medical intervention for : Neurological problems;Diabetes problems  Ksenia Kunz, [place] 12/28/2017, 1:04 PM

## 2017-12-28 NOTE — Progress Notes (Signed)
Patient discharged to home, accompanied by her daughter,

## 2017-12-29 ENCOUNTER — Telehealth: Payer: Self-pay | Admitting: Registered Nurse

## 2017-12-29 NOTE — Telephone Encounter (Signed)
Transitional Care call Transitional Care Call Completed, Appointment Confirmed, Address Confirmed Transitional Care Questions answered by niece Ms. Dyanne Carrel  Patient name: Heather [name] DOB: 08/03/1958 1. Are you/is patient experiencing any problems since coming home? Still having headaches, she's taking the tylenol #3, if pain persists, instructed to go to the emergency room for evaluation. She verbalizes understanding. a. Are there any questions regarding any aspect of care? No  2. Are there any questions regarding medications administration/dosing? No a. Are meds being taken as prescribed? Yes b. "Patient should review meds with caller to confirm"  Medication List Reviewed 3. Have there been any falls? No 4. Has Home Health been to the house and/or have they contacted you? [facility] Called,  she states they stated the will be calling to set up appointment.  a. If not, have you tried to contact them? NA b. Can we help you contact them? NA 5. Are bowels and bladder emptying properly? Yes a. Are there any unexpected incontinence issues? No b. If applicable, is patient following bowel/bladder programs? Yes 6. Any fevers, problems with breathing, unexpected pain? No 7. Are there any skin problems or new areas of breakdown? No 8. Has the patient/family member arranged specialty MD follow up (ie cardiology/neurology/renal/surgical/etc.)?  They are in the process of making calls.  a. Can we help arrange? NA 9. Does the patient need any other services or support that we can help arrange? No 10. Are caregivers following through as expected in assisting the patient? Yes 11. Has the patient quit smoking, drinking alcohol, or using drugs as recommended? According to Ms. Polanco, Ms. [name] is not smoking, drinking alcohol or using illicit drugs.   Appointment date/time 01/03/2018 arrival time 2:20 for 2:40 appointment with Dr Riley Kill. At [address] [facility] [address]

## 2018-01-03 ENCOUNTER — Encounter: Payer: Self-pay | Admitting: Physical Medicine & Rehabilitation

## 2018-01-04 ENCOUNTER — Telehealth: Payer: Self-pay

## 2018-01-04 NOTE — Telephone Encounter (Signed)
Beaulah Corin OT [facility] called requesting verbal orders for 2xwk X 1wk then 1xwk X 3wks.  Called her back and left message on her confidential voice mail approving verbal orders.

## 2018-01-05 ENCOUNTER — Telehealth: Payer: Self-pay

## 2018-01-05 NOTE — Telephone Encounter (Signed)
[facility] at [facility] called asking for verbal orders for pt for 1 wk 1 and once again every other week. Ph# [phone]

## 2018-01-05 NOTE — Telephone Encounter (Signed)
Verbal orders per office protocol

## 2018-01-12 ENCOUNTER — Other Ambulatory Visit: Payer: Self-pay

## 2018-01-12 ENCOUNTER — Ambulatory Visit: Payer: Self-pay | Attending: Internal Medicine | Admitting: Physician Assistant

## 2018-01-12 VITALS — BP 115/84 | HR 98 | Temp 98.4°F | Resp 18 | Ht 67.0 in | Wt 249.8 lb

## 2018-01-12 DIAGNOSIS — I63411 Cerebral infarction due to embolism of right middle cerebral artery: Secondary | ICD-10-CM

## 2018-01-12 DIAGNOSIS — E1169 Type 2 diabetes mellitus with other specified complication: Secondary | ICD-10-CM

## 2018-01-12 DIAGNOSIS — I63511 Cerebral infarction due to unspecified occlusion or stenosis of right middle cerebral artery: Secondary | ICD-10-CM | POA: Insufficient documentation

## 2018-01-12 DIAGNOSIS — E119 Type 2 diabetes mellitus without complications: Secondary | ICD-10-CM | POA: Insufficient documentation

## 2018-01-12 DIAGNOSIS — Z7982 Long term (current) use of aspirin: Secondary | ICD-10-CM | POA: Insufficient documentation

## 2018-01-12 DIAGNOSIS — Z7984 Long term (current) use of oral hypoglycemic drugs: Secondary | ICD-10-CM | POA: Insufficient documentation

## 2018-01-12 DIAGNOSIS — E669 Obesity, unspecified: Secondary | ICD-10-CM

## 2018-01-12 DIAGNOSIS — Z87891 Personal history of nicotine dependence: Secondary | ICD-10-CM | POA: Insufficient documentation

## 2018-01-12 DIAGNOSIS — I10 Essential (primary) hypertension: Secondary | ICD-10-CM | POA: Insufficient documentation

## 2018-01-12 LAB — GLUCOSE, POCT (MANUAL RESULT ENTRY): POC GLUCOSE: 122 mg/dL — AB (ref 70–99)

## 2018-01-12 MED ORDER — TICAGRELOR 90 MG PO TABS: 90.0000 mg | ORAL_TABLET | Freq: Two times a day (BID) | ORAL | 3 refills | Status: DC

## 2018-01-12 MED ORDER — GLIMEPIRIDE 1 MG PO TABS: 1.0000 mg | ORAL_TABLET | Freq: Every day | ORAL | 3 refills | Status: DC

## 2018-01-12 MED ORDER — TRUEPLUS LANCETS 28G MISC: 28.0000 g | Freq: Four times a day (QID) | 3 refills | Status: DC

## 2018-01-12 MED ORDER — TRUE METRIX METER DEVI: 1.0000 | Freq: Four times a day (QID) | 0 refills | Status: DC

## 2018-01-12 MED ORDER — GLUCOSE BLOOD VI STRP: ORAL_STRIP | 12 refills | Status: DC

## 2018-01-12 NOTE — Progress Notes (Signed)
Chief Complaint: Hospital follow up  Subjective: This is a 62 year old right-handed Spanish- speaking female with history of hypertension, tobacco abuse and on no scheduled meds. She presented on December 17, 2017, with left-sided weakness, headache and dysarthria.  Blood pressure 157/112.  CT of the head showed right MCA territory infarction, no associated hemorrhage. Mild regional mass effect.  No midline shift.  The patient did not receive tPA (out of window).  CT angiogram of head and neck showed abrupt proximal right M2 occlusion, underwent mechanical thrombectomy per Interventional Radiology.  Maintained on aspirin as well as Brilinta for CVA prophylaxis.  Echocardiogram completed, that showed ejection fraction of 45%, grade 1 diastolic dysfunction.   Physical, speech and occupational therapy ongoing.  The patient was admitted for a comprehensive rehab from 12/19/17-12/28/17.   Since discharge he has been compliant with her medications. She is staying with family. She does maintain her home resident as well. She is not driving. She's getting stronger on the left side every day. Her speech has improved. Her appetite is good. She is sleeping okay. She has stopped smoking.   ROS:  GEN: denies fever or chills, denies change in weight Skin: denies lesions or rashes HEENT: denies headache, earache, epistaxis, sore throat, or neck pain LUNGS: denies SHOB, dyspnea, PND, orthopnea CV: denies CP or palpitations ABD: denies abd pain, N or V EXT: denies muscle spasms or swelling; no pain in lower ext, no weakness NEURO: no numbness or tingling, denies sz, stroke or TIA   Objective:  Vitals:   01/12/18 1409  BP: 115/84  Pulse: 98  Resp: 18  Temp: 98.4 F (36.9 C)  TempSrc: Oral  SpO2: 96%  Weight: 249 lb 12.8 oz (113.3 kg)  Height: '5\' 7"'  (1.702 m)    Physical Exam:benign  General: in no acute distress. HEENT: no pallor, no icterus, moist oral mucosa, no JVD, no  lymphadenopathy Heart: Normal  s1 &s2  Regular rate and rhythm, without murmurs, rubs, gallops. Lungs: Clear to auscultation bilaterally. Abdomen: Soft, nontender, nondistended, positive bowel sounds. Extremities: No clubbing cyanosis or edema with positive pedal pulses. Neuro: Alert, awake, oriented x3, nonfocal.   Medications: Prior to Admission medications   Medication Sig Start Date End Date Taking? Authorizing Provider  acetaminophen-codeine (TYLENOL #3) 300-30 MG tablet Take 1-2 tablets by mouth every 6 (six) hours as needed for moderate pain. 12/27/17   Angiulli, Lavon Paganini, PA-C  aspirin 81 MG chewable tablet Chew 1 tablet (81 mg total) by mouth daily. 12/27/17   Angiulli, Lavon Paganini, PA-C  Blood Glucose Monitoring Suppl (TRUE METRIX METER) DEVI 1 kit by Does not apply route 4 (four) times daily. 01/12/18   Brayton Caves, PA-C  glimepiride (AMARYL) 1 MG tablet Take 1 tablet (1 mg total) by mouth daily with breakfast. 01/25/18   Brayton Caves, PA-C  glucose blood (TRUE METRIX BLOOD GLUCOSE TEST) test strip Use as instructed 01/12/18   Brayton Caves, PA-C  ticagrelor (BRILINTA) 90 MG TABS tablet Take 1 tablet (90 mg total) by mouth 2 (two) times daily. 01/25/18   Brayton Caves, PA-C  TRUEPLUS LANCETS 28G MISC 28 g by Does not apply route QID. 01/12/18   Brayton Caves, PA-C    Assessment: 1. Right MCA CVA s/p mechanical thrombectomy 2. New onset DM 2 3. HTN  Plan: ASA/Brilinta RF modification Lifestyle changes BS meter, check 3-4 times per day Aim for 30 minutes of exercise most days. Rethink what you drink. Water is great! Aim  for 2-3 Carb Choices per meal (30-45 grams) +/- 1 either way  Aim for 0-15 Carbs per snack if hungry  Include protein in moderation with your meals and snacks  Consider reading food labels for Total Carbohydrate and Fat Grams of foods  Consider checking BG at alternate times per day  Continue taking medication as directed Be mindful about how  much sugar you are adding to beverages and other foods. Fruit Punch - find one with no sugar  Measure and decrease portions of carbohydrate foods  Make your plate and don't go back for seconds Watch BP Keep neuro and IR appointments  Follow up:4 weeks  The patient was given clear instructions to go to ER or return to medical center if symptoms don't improve, worsen or new problems develop. The patient verbalized understanding. The patient was told to call to get lab results if they haven't heard anything in the next week.   This note has been created with Surveyor, quantity. Any transcriptional errors are unintentional.   Zettie Pho, PA-C 01/12/2018, 2:45 PM

## 2018-01-15 ENCOUNTER — Other Ambulatory Visit (HOSPITAL_COMMUNITY): Payer: Self-pay | Admitting: Interventional Radiology

## 2018-01-15 DIAGNOSIS — I639 Cerebral infarction, unspecified: Secondary | ICD-10-CM

## 2018-01-31 ENCOUNTER — Ambulatory Visit (HOSPITAL_COMMUNITY)
Admission: RE | Admit: 2018-01-31 | Discharge: 2018-01-31 | Disposition: A | Discharge status: Home / Self Care | Payer: Self-pay | Source: Ambulatory Visit | Attending: Interventional Radiology | Admitting: Interventional Radiology

## 2018-01-31 DIAGNOSIS — I639 Cerebral infarction, unspecified: Secondary | ICD-10-CM

## 2018-01-31 HISTORY — PX: IR RADIOLOGIST EVAL & MGMT: IMG5224

## 2018-02-02 ENCOUNTER — Encounter (HOSPITAL_COMMUNITY): Payer: Self-pay | Admitting: Interventional Radiology

## 2018-02-05 DIAGNOSIS — Z7984 Long term (current) use of oral hypoglycemic drugs: Secondary | ICD-10-CM

## 2018-02-05 DIAGNOSIS — I69312 Visuospatial deficit and spatial neglect following cerebral infarction: Secondary | ICD-10-CM

## 2018-02-05 DIAGNOSIS — Z7902 Long term (current) use of antithrombotics/antiplatelets: Secondary | ICD-10-CM

## 2018-02-05 DIAGNOSIS — F17210 Nicotine dependence, cigarettes, uncomplicated: Secondary | ICD-10-CM

## 2018-02-05 DIAGNOSIS — I119 Hypertensive heart disease without heart failure: Secondary | ICD-10-CM

## 2018-02-05 DIAGNOSIS — R1310 Dysphagia, unspecified: Secondary | ICD-10-CM

## 2018-02-05 DIAGNOSIS — E669 Obesity, unspecified: Secondary | ICD-10-CM

## 2018-02-05 DIAGNOSIS — Z7982 Long term (current) use of aspirin: Secondary | ICD-10-CM

## 2018-02-05 DIAGNOSIS — I69354 Hemiplegia and hemiparesis following cerebral infarction affecting left non-dominant side: Secondary | ICD-10-CM

## 2018-02-05 DIAGNOSIS — E119 Type 2 diabetes mellitus without complications: Secondary | ICD-10-CM

## 2018-02-05 DIAGNOSIS — I69322 Dysarthria following cerebral infarction: Secondary | ICD-10-CM

## 2018-02-05 DIAGNOSIS — R52 Pain, unspecified: Secondary | ICD-10-CM

## 2018-02-05 DIAGNOSIS — R419 Unspecified symptoms and signs involving cognitive functions and awareness: Secondary | ICD-10-CM

## 2018-02-20 ENCOUNTER — Encounter: Payer: Self-pay | Admitting: Internal Medicine

## 2018-02-20 ENCOUNTER — Ambulatory Visit: Payer: Self-pay | Attending: Internal Medicine | Admitting: Internal Medicine

## 2018-02-20 VITALS — BP 131/86 | HR 86 | Temp 98.1°F | Resp 16 | Wt 248.2 lb

## 2018-02-20 DIAGNOSIS — Z23 Encounter for immunization: Secondary | ICD-10-CM

## 2018-02-20 DIAGNOSIS — Z7982 Long term (current) use of aspirin: Secondary | ICD-10-CM | POA: Insufficient documentation

## 2018-02-20 DIAGNOSIS — I10 Essential (primary) hypertension: Secondary | ICD-10-CM

## 2018-02-20 DIAGNOSIS — F17210 Nicotine dependence, cigarettes, uncomplicated: Secondary | ICD-10-CM | POA: Insufficient documentation

## 2018-02-20 DIAGNOSIS — E119 Type 2 diabetes mellitus without complications: Secondary | ICD-10-CM | POA: Insufficient documentation

## 2018-02-20 DIAGNOSIS — Z7984 Long term (current) use of oral hypoglycemic drugs: Secondary | ICD-10-CM | POA: Insufficient documentation

## 2018-02-20 DIAGNOSIS — E1169 Type 2 diabetes mellitus with other specified complication: Secondary | ICD-10-CM

## 2018-02-20 DIAGNOSIS — I63411 Cerebral infarction due to embolism of right middle cerebral artery: Secondary | ICD-10-CM

## 2018-02-20 DIAGNOSIS — F329 Major depressive disorder, single episode, unspecified: Secondary | ICD-10-CM

## 2018-02-20 DIAGNOSIS — F32A Depression, unspecified: Secondary | ICD-10-CM

## 2018-02-20 DIAGNOSIS — E669 Obesity, unspecified: Secondary | ICD-10-CM

## 2018-02-20 LAB — GLUCOSE, POCT (MANUAL RESULT ENTRY): POC GLUCOSE: 175 mg/dL — AB (ref 70–99)

## 2018-02-20 LAB — POCT GLYCOSYLATED HEMOGLOBIN (HGB A1C): HbA1c, POC (prediabetic range): 6.3 % (ref 5.7–6.4)

## 2018-02-20 MED ORDER — TETANUS-DIPHTH-ACELL PERTUSSIS 5-2.5-18.5 LF-MCG/0.5 IM SUSP: 0.5000 mL | Freq: Once | INTRAMUSCULAR | 0 refills | Status: AC

## 2018-02-20 MED ORDER — SERTRALINE HCL 50 MG PO TABS: ORAL_TABLET | ORAL | 3 refills | Status: DC

## 2018-02-20 MED ORDER — LISINOPRIL 2.5 MG PO TABS: 2.5000 mg | ORAL_TABLET | Freq: Every day | ORAL | 5 refills | Status: DC

## 2018-02-20 NOTE — Progress Notes (Signed)
Patient ID: Heather [name], female    DOB: 11/08/1955  MRN: [id number]  CC: re-establish   Subjective: Heather [name] is a 62 y.o. female who presents for chronic ds management and to est with me as PCP Her concerns today include:  Hx of DM, HTN, former smoker tob dep, CVA RT MCA territory, s/p thrombectomy followed by stenting of M2 by interventional radiology  CVA:  Feels strength on LT side has improved Independent in ADLs.  No falls. Quit smoking completely post CVA Taking ASA and Brilinta Was suppose to have f/u with Dr. Estanislado Pandy and Dr. Leonie Man in [facility] but no appts given on dischg from rehab in 12/3017 Reports feeling more depress than usual since CVA.  Denies SI.  Good family support.  She feels she would benefit from being on medication.  DM:  Checks BS daily in a.m; less 150 usually. Doing okay with eating habits Walking 15-20 mins every morning Tolerating Amaryl  Patient Active Problem List   Diagnosis Date Noted  . Diabetes mellitus type 2 in obese ([place]) 12/20/2017  . Right middle cerebral artery stroke ([place]) 12/19/2017  . Left hemiparesis ([place])   . Dysphagia, oropharyngeal   . Middle cerebral artery embolism, 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  . Acute right arterial ischemic stroke, middle cerebral artery (MCA) ([place]) 12/17/2017     Current Outpatient Medications on File Prior to Visit  Medication Sig Dispense Refill  . aspirin 81 MG chewable tablet Chew 1 tablet (81 mg total) by mouth daily.    . Blood Glucose Monitoring Suppl (TRUE METRIX METER) DEVI 1 kit by Does not apply route 4 (four) times daily. 1 Device 0  . glimepiride (AMARYL) 1 MG tablet Take 1 tablet (1 mg total) by mouth daily with breakfast. 30 tablet 3  . glucose blood (TRUE METRIX BLOOD GLUCOSE TEST) test strip Use as instructed 100 each 12  . ticagrelor (BRILINTA) 90 MG TABS tablet Take 1 tablet (90 mg total) by mouth 2 (two) times daily. 60 tablet 3  . TRUEPLUS LANCETS 28G MISC 28 g by Does not apply route  QID. 120 each 3   No current facility-administered medications on file prior to visit.     No Known Allergies  Social History   Socioeconomic History  . Marital status: Single    Spouse name: Not on file  . Number of children: Not on file  . Years of education: Not on file  . Highest education level: Not on file  Occupational History  . Not on file  Social Needs  . Financial resource strain: Not on file  . Food insecurity:    Worry: Not on file    Inability: Not on file  . Transportation needs:    Medical: Not on file    Non-medical: Not on file  Tobacco Use  . Smoking status: Current Every Day Smoker    Packs/day: 0.50    Years: 30.00    Pack years: 15.00    Types: Cigarettes  . Smokeless tobacco: Never Used  Substance and Sexual Activity  . Alcohol use: Yes    Comment: weekly  . Drug use: No  . Sexual activity: Not Currently    Birth control/protection: None  Lifestyle  . Physical activity:    Days per week: Not on file    Minutes per session: Not on file  . Stress: Not on file  Relationships  . Social connections:    Talks on phone: Not on file    Gets  together: Not on file    Attends religious service: Not on file    Active member of club or organization: Not on file    Attends meetings of clubs or organizations: Not on file    Relationship status: Not on file  . Intimate partner violence:    Fear of current or ex partner: Not on file    Emotionally abused: Not on file    Physically abused: Not on file    Forced sexual activity: Not on file  Other Topics Concern  . Not on file  Social History Narrative  . Not on file    Family History  Problem Relation Age of Onset  . Diabetes Mother   . Pulmonary disease Mother   . Heart disease Father     Past Surgical History:  Procedure Laterality Date  . BREAST EXCISIONAL BIOPSY    . BREAST SURGERY     right breast  . CHOLECYSTECTOMY    . IR CT HEAD LTD  12/18/2017  . IR INTRA CRAN STENT  12/18/2017    . IR PERCUTANEOUS ART THROMBECTOMY/INFUSION INTRACRANIAL INC DIAG ANGIO  12/18/2017  . IR RADIOLOGIST EVAL & MGMT  01/31/2018  . RADIOLOGY WITH ANESTHESIA N/A 12/18/2017   Procedure: RADIOLOGY WITH ANESTHESIA;  Surgeon: Luanne Bras, MD;  Location: [facility];  Service: Radiology;  Laterality: N/A;    ROS: Review of Systems Neg except as above PHYSICAL EXAM: BP 131/86   Pulse 86   Temp 98.1 F (36.7 C) (Oral)   Resp 16   Wt 248 lb 3.2 oz (112.6 kg)   SpO2 99%   BMI 38.87 kg/m   Wt Readings from Last 3 Encounters:  02/20/18 248 lb 3.2 oz (112.6 kg)  01/12/18 249 lb 12.8 oz (113.3 kg)  12/27/17 262 lb 5.6 oz (119 kg)   BP 126/86 Physical Exam  General appearance - alert, well appearing, and in no distress Mental status - normal mood, behavior, speech, dress, motor activity, and thought processes Neck - supple, no significant adenopathy Chest - clear to auscultation, no wheezes, rales or rhonchi, symmetric air entry Heart - normal rate, regular rhythm, normal S1, S2, no murmurs, rubs, clicks or gallops Extremities - peripheral pulses normal, no pedal edema, no clubbing or cyanosis MSK: ambulates without assistive device Results for orders placed or performed in visit on 02/20/18  POCT glucose (manual entry)  Result Value Ref Range   POC Glucose 175 (A) 70 - 99 mg/dl  POCT glycosylated hemoglobin (Hb A1C)  Result Value Ref Range   Hemoglobin A1C  4.0 - 5.6 %   HbA1c, POC (prediabetic range) 6.3 5.7 - 6.4 %   HbA1c, POC (controlled diabetic range)  0.0 - 7.0 %   ASSESSMENT AND PLAN: 1. Diabetes mellitus type 2 in obese (HCC) Cont Amaryl and healthy eating habits - POCT glucose (manual entry) - POCT glycosylated hemoglobin (Hb A1C) - Microalbumin / creatinine urine ratio  2. Depression, unspecified depression type Discussed putting her on low dose Zoloft to help depress mood.  Pt agreeable.  Pt advise that it can take about 4 wks before she feels better - sertraline  (ZOLOFT) 50 MG tablet; 1/2 tab PO daily x 2 weeks then 1 tab daily  Dispense: 30 tablet; Refill: 3  3. Essential hypertension DBP has remained above goal.  Start low dose ACE-I.  DASh discussed - lisinopril (PRINIVIL,ZESTRIL) 2.5 MG tablet; Take 1 tablet (2.5 mg total) by mouth daily.  Dispense: 30 tablet; Refill: 5  4. Cerebrovascular accident (CVA) due to embolism of right middle cerebral artery ([place]) -pt has appt with Dr. Leonie Man with [facility] next mth. Will try to get her schedule with Dr. Estanislado Pandy    Patient was given the opportunity to ask questions.  Patient verbalized understanding of the plan and was able to repeat key elements of the plan.   Orders Placed This Encounter  Procedures  . Pneumococcal polysaccharide vaccine 23-valent greater than or equal to 2yo subcutaneous/IM  . Tdap vaccine greater than or equal to 7yo IM  . Microalbumin / creatinine urine ratio  . POCT glucose (manual entry)  . POCT glycosylated hemoglobin (Hb A1C)     Requested Prescriptions   Signed Prescriptions Disp Refills  . Tdap (BOOSTRIX) 5-2.5-18.5 LF-MCG/0.5 injection 0.5 mL 0    Sig: Inject 0.5 mLs into the muscle once for 1 dose.  . sertraline (ZOLOFT) 50 MG tablet 30 tablet 3    Sig: 1/2 tab PO daily x 2 weeks then 1 tab daily  . lisinopril (PRINIVIL,ZESTRIL) 2.5 MG tablet 30 tablet 5    Sig: Take 1 tablet (2.5 mg total) by mouth daily.    Return in about 2 months (around 04/22/2018).  Karle Plumber, MD, FACP

## 2018-02-20 NOTE — Patient Instructions (Signed)
Vacuna Td (contra la difteria y el ttanos): Lo que debe saber (Td Vaccine [place] and Diphtheria]: What You Need to Know) 1. Por qu vacunarse? El ttanos y la difteria son enfermedades muy graves. Son Academic librarian frecuentes en los [place] actualmente, pero las personas que se infectan suelen tener complicaciones graves. La vacuna Td se [place] para proteger a los adolescentes y a los adultos de ambas enfermedades. Tanto el ttanos como la difteria son infecciones causadas por bacterias. La difteria se transmite de persona a persona a travs de la tos o el estornudo. La bacteria que causa el ttanos entra al cuerpo a travs de cortes, raspones o heridas. El TTANOS (trismo) provoca entumecimiento y Engineer, materials dolorosa de los msculos, por lo general, en todo el cuerpo.  Puede causar el endurecimiento de los msculos de la cabeza y el cuello, de modo que impide abrir la boca, tragar y en algunos casos, Industrial/product designer. El ttanos es causa de muerte en aproximadamente 1de cada 10personas que contraen la infeccin, incluso despus de que reciben la mejor atencin mdica. La DIFTERIA puede hacer que se forme una membrana gruesa en la parte posterior de la garganta.  Puede causar problemas respiratorios, parlisis, insuficiencia cardaca e incluso la muerte. Antes de las vacunas, en los [place] se informaban 200000 casos de difteria y cientos de casos de ttanos cada ao. Desde que comenz la vacunacin, los informes de casos de ambas enfermedades se han reducido en un 99%. 2. Madilyn Fireman Td La vacuna Td protege a adolescentes y adultos contra el ttanos y la difteria. La vacuna Td habitualmente se aplica como dosis de refuerzo cada 10aos, pero tambin puede administrarse antes si la persona sufre una [place] o herida sucia y grave. A veces, en lugar de la vacuna Td, se recomienda una vacuna llamada Tdap, que protege contra la tosferina, adems de proteger contra el ttanos y la difteria. El mdico o  la persona que le aplique la vacuna puede darle ms informacin al [employer]. La Td puede administrarse de manera segura simultneamente con otras vacunas. 3. Algunas personas no deben recibir la vacuna  Una persona que alguna vez ha tenido una reaccin alrgica potencialmente mortal a una dosis anterior de cualquier vacuna contra el ttanos o la difteria, O que tenga una alergia grave a cualquier parte de esta vacuna, no debe recibir la vacuna Td. Informe a la persona que le aplica la vacuna si usted tiene cualquier alergia grave.  Consulte con su mdico si: ? tuvo hinchazn o dolor intenso despus de recibir cualquier vacuna contra la difteria o el ttanos, ? alguna vez ha sufrido el sndrome de Pension scheme manager, ? no se siente Research scientist (life sciences) en que se ha programado la vacuna.  4. Riesgos de [place] reaccin a la vacuna Con cualquier medicamento, incluyendo las vacunas, existe la posibilidad de que aparezcan efectos secundarios. Suelen ser leves y desaparecen por s solos. Tambin son posibles las reacciones graves, pero en raras ocasiones. La [employer] de las personas a las que se les aplica la vacuna Td no tienen ningn problema. Problemas leves despus de la vacuna Td: (No interfirieron en otras actividades)  Dolor en el lugar donde se aplic la vacuna (alrededor de 8de cada 10personas)  Enrojecimiento o hinchazn en el lugar donde se aplic la vacuna (alrededor de 1de cada 4personas)  Fiebre leve (poco frecuente)  Dolor de Training and development officer (alrededor de 1de cada 4personas)  Cansancio (alrededor de 1de cada 4personas) Problemas moderados despus de la vacuna Td: (Interfirieron en otras actividades,  pero no requirieron atencin mdica)  Fiebre superior a 102F (38,8C) (poco frecuente) Problemas graves despus de la vacuna Td: (Impidieron Education officer, environmental las actividades habituales; requirieron atencin mdica)  Buyer, retail, dolor intenso, sangrado o enrojecimiento en el brazo en que se aplic la  vacuna (poco frecuente). Problemas que podran ocurrir despus de cualquier vacuna:  Las personas a veces se desmayan despus de un procedimiento mdico, incluida la vacunacin. Si permanece sentado o recostado durante 15 minutos puede ayudar a [employer] y las lesiones causadas por las cadas. Informe al mdico si se siente mareado, tiene cambios en la visin o zumbidos en los odos.  Algunas personas sienten un dolor intenso en el hombro y tienen dificultad para mover el brazo donde se coloc la vacuna. Esto sucede con muy poca frecuencia.  Cualquier medicamento puede causar una reaccin alrgica grave. Dichas reacciones son Lynnae Sandhoff poco frecuentes con una vacuna (se calcula que menos de 1en un milln de dosis) y se producen de unos minutos a unas horas despus de Arts development officer. Al igual que con cualquier Automatic Data, existe una probabilidad muy remota de que una vacuna cause una lesin grave o la [place]. Se controla permanentemente la seguridad de las vacunas. Para obtener ms informacin, visite: http://floyd.org/. 5. Qu pasa si hay una reaccin grave? A qu signos debo estar atento?  Observe todo lo que le preocupe, como signos de una reaccin alrgica grave, fiebre muy alta o comportamiento fuera de lo normal. Los signos de una reaccin alrgica grave pueden incluir ronchas, hinchazn de la cara y la garganta, dificultad para respirar, latidos cardacos acelerados, mareos y debilidad. Generalmente, estos comenzaran entre unos pocos minutos y algunas horas despus de la vacunacin. Qu debo hacer?  Si usted piensa que se trata de una reaccin alrgica grave o de otra emergencia que no puede esperar, llame al 911 o dirjase al hospital ms cercano. Sino, llame a su mdico.  Despus, la reaccin debe informarse al [address] de Informacin sobre Efectos Adversos de las [place] (Vaccine Adverse Event Reporting System, VAERS). Su mdico puede presentar este informe, o puede  hacerlo usted mismo a travs del sitio web de VAERS, en [email or web address].LAgents.no, o llamando al [phone]. VAERS no brinda recomendaciones mdicas. 6. [employer] de Compensacin de Daos por [employer] [place] de Compensacin de Daos por Administrator, arts (National Vaccine Injury Compensation Program, VICP) es un programa federal que fue creado para Patent examiner a las personas que puedan haber sufrido daos al recibir ciertas vacunas. Aquellas personas que consideren que han sufrido un dao como consecuencia de una vacuna y [place] saber ms acerca del programa y de cmo presentar Roslynn Amble, pueden llamar al [phone] o visitar su sitio web en [email or web address]. Hay un lmite de tiempo para presentar un reclamo de compensacin. 7. Cmo puedo obtener ms informacin?  Consulte a su mdico. Este puede darle el prospecto de la vacuna o recomendarle otras fuentes de informacin.  Comunquese con el servicio de salud de su localidad o [address].  Comunquese con los Centros para Air traffic controller y la Prevencin de Child psychotherapist for Disease Control and Prevention , CDC). ? Llame al [phone] (1-800-CDC-INFO). ? Visite el sitio Environmental manager en [email or web address]. Declaracin de informacin sobre la vacuna contra la difteria y el ttanos (Td) de los CDC (01/12/16) Esta informacin no tiene Theme park manager el consejo del mdico. Asegrese de hacerle al mdico cualquier pregunta que tenga. Document Released: 01/05/2009 Document Revised: 10/10/2014 Document Reviewed: 01/12/2016 Elsevier Interactive Patient Education  2017 Elsevier  Inc.  Madilyn Fireman antineumoccica de polisacridos: Lo que debe saber (Pneumococcal Polysaccharide Vaccine: What You Need to Know) 1. Por qu vacunarse? La vacunacin puede proteger a los [employer] (y a algunos nios y adultos ms jvenes) de la enfermedad neumoccica. La enfermedad neumoccica es causada por una bacteria que puede  contagiarse de [place] persona a otra por contacto directo. Puede provocar infecciones en los odos y tambin infecciones ms graves en:  los pulmones (neumona),  la sangre (bacteriemia), y  las membranas que cubren el cerebro y la mdula espinal (meningitis). La meningitis puede causar sordera y dao cerebral, y puede ser mortal. Cualquier persona puede contraer la enfermedad neumoccica, pero los nios menores de 2 aos, las personas con [employer], los adultos mayores de 65 aos y los fumadores son los que corren un mayor riesgo. Cada ao, mueren alrededor de [id number] adultos mayores a causa de la enfermedad [facility] [place]. El tratamiento de las infecciones neumoccicas con penicilina y otros medicamentos era ms efectivo en el pasado. Sin embargo, algunas cepas de la bacteria que causa la enfermedad se han vuelto resistentes a estos medicamentos. Esto hace que la prevencin de la enfermedad mediante la vacunacin sea an ms importante. 2. Madilyn Fireman antineumoccica de polisacridos (PPSV23) La vacuna antineumoccica de polisacridos (PPSV23) protege contra 23tipos de bacterias neumoccicas. No previene todas las enfermedades neumoccicas. La vacuna PPSV23 se recomienda para las siguientes personas:  Todos los adultos de 65aos en adelante.  Teresita Madura persona de 2a 64aos que tenga un problema de salud a Air cabin crew.  [employer] de 2 a 64 aos que tengan el sistema inmunitario dbil.  Los adultos de 19 a 64 aos que fumen o tengan asma. La Harley-Davidson de las personas solo necesitan una dosis de la vacuna PPSV. Para ciertos grupos de alto riesgo se recomienda una segunda dosis. Las [employer] de 65 aos deben recibir una dosis de la vacuna, aun si recibieron una o ms dosis antes de [employer]. El mdico puede brindarle ms informacin sobre estas recomendaciones. La mayora de los adultos sanos adquiere proteccin 2 a 3semanas despus de haber recibido  la vacuna. 3. Algunas personas no deben recibir la vacuna  Teresita Madura persona que haya sufrido una reaccin alrgica potencialmente mortal a la PPSV no debe recibir otra dosis.  Las personas que tengan Programmer, multimedia grave a los componentes de la vacuna PPSV no deben recibirla. Informe a su mdico si sufre de algn tipo de alergia grave.  Teresita Madura persona que est enferma en un grado moderado o grave el da en que est programada la vacunacin, probablemente deba esperar a recuperarse para recibirla. Por lo general, una persona con una enfermedad leve puede vacunarse.  Los nios menores de 2 aos no deben recibir [employer].  No hay pruebas de que la vacuna PPSV sea perjudicial para las mujeres embarazadas o el feto. No obstante, como precaucin, las mujeres que necesiten aplicarse la vacuna deben hacerlo antes de quedar embarazadas, si es posible.  4. Riesgos de [place] reaccin a la vacuna Con cualquier medicamento, incluyendo las vacunas, existe la posibilidad de que aparezcan efectos secundarios. Estos son leves y desaparecen por s solos, pero tambin son posibles las reacciones graves. Aproximadamente la mitad de las personas que reciben la PPSV presentan efectos secundarios leves, como enrojecimiento o Art therapist donde se aplic la vacuna, que desaparecen a los [address] Menos de 1 de cada 100personas presenta fiebre, dolores musculares o reacciones locales ms graves. Problemas que  podran ocurrir despus de cualquier vacuna:  Las personas a veces se desmayan despus de un procedimiento mdico, incluso despus de recibir Scientist, forensic. Si permanece sentado o recostado durante 15 minutos puede ayudar a [employer] y las lesiones causadas por las cadas. Informe al mdico si se siente mareado, tiene cambios en la visin o zumbidos en los odos.  Algunas personas sienten un dolor intenso en el hombro y tienen dificultad para mover el brazo donde se coloc la vacuna. Esto sucede con muy poca  frecuencia.  Cualquier medicamento puede causar una reaccin alrgica grave. Dichas reacciones son Lynnae Sandhoff poco frecuentes con una vacuna (se calcula que menos de 1en un milln de dosis) y se producen unos minutos a unas horas despus de la vacunacin. Al igual que con cualquier Automatic Data, existe una probabilidad remota de que una vacuna cause una lesin grave o la [place]. Se controla permanentemente la seguridad de las vacunas. Para obtener ms informacin, visite: http://floyd.org/. 5. Qu pasa si hay una reaccin grave? A qu signos debo estar atento? Observe todo lo que le preocupe, como signos de una reaccin alrgica grave, fiebre muy alta o comportamiento fuera de lo normal. Los signos de una reaccin alrgica grave pueden incluir ronchas, hinchazn de la cara y la garganta, dificultad para respirar, latidos cardacos acelerados, mareos y debilidad. Generalmente, estos comenzaran entre unos pocos minutos y algunas horas despus de la vacunacin. Qu debo hacer? Si usted piensa que se trata de una reaccin alrgica grave o de otra emergencia que no puede esperar, llame al 911 o dirjase al hospital ms cercano. Sino, llame a su mdico. Despus, la reaccin debe informarse al [address] de Informacin sobre Efectos Adversos de las [place] (Vaccine Adverse Event Reporting System, VAERS). Su mdico puede presentar este informe, o puede hacerlo usted mismo a travs del sitio web de VAERS, en www.[email or web address].LAgents.no, o llamando al [phone]. VAERS no brinda recomendaciones mdicas. 6. Cmo puedo obtener ms informacin?  Consulte a su mdico. Este puede darle el prospecto de la vacuna o recomendarle otras fuentes de informacin.  Comunquese con el servicio de salud de su localidad o [address].  Comunquese con los Centros para Air traffic controller y la Prevencin de Child psychotherapist for Disease Control and Prevention , CDC). ? Llame al [phone] (1-800-CDC-INFO), o ? Visite el sitio  Environmental manager en [email or web address]. Declaracin de informacin sobre la vacuna antineumoccica de polisacridos de los CDC (01/24/14) Esta informacin no tiene Theme park manager el consejo del mdico. Asegrese de hacerle al mdico cualquier pregunta que tenga. Document Released: 12/16/2008 Document Revised: 10/10/2014 Document Reviewed: 01/27/2014 Elsevier Interactive Patient Education  2017 [employer].

## 2018-02-21 ENCOUNTER — Other Ambulatory Visit (HOSPITAL_COMMUNITY): Payer: Self-pay | Admitting: Interventional Radiology

## 2018-02-21 DIAGNOSIS — F32A Depression, unspecified: Secondary | ICD-10-CM | POA: Insufficient documentation

## 2018-02-21 DIAGNOSIS — I63411 Cerebral infarction due to embolism of right middle cerebral artery: Secondary | ICD-10-CM | POA: Insufficient documentation

## 2018-02-21 DIAGNOSIS — F329 Major depressive disorder, single episode, unspecified: Secondary | ICD-10-CM | POA: Insufficient documentation

## 2018-02-21 DIAGNOSIS — I639 Cerebral infarction, unspecified: Secondary | ICD-10-CM

## 2018-02-21 DIAGNOSIS — I10 Essential (primary) hypertension: Secondary | ICD-10-CM | POA: Insufficient documentation

## 2018-02-21 LAB — MICROALBUMIN / CREATININE URINE RATIO
CREATININE, UR: 152.8 mg/dL
MICROALBUM., U, RANDOM: 6.9 ug/mL
Microalb/Creat Ratio: 4.5 mg/g creat (ref 0.0–30.0)

## 2018-02-28 ENCOUNTER — Other Ambulatory Visit: Payer: Self-pay

## 2018-02-28 MED ORDER — PNEUMOCOCCAL VAC POLYVALENT 25 MCG/0.5ML IJ INJ: 0.5000 mL | INJECTION | Freq: Once | INTRAMUSCULAR | 0 refills | Status: AC

## 2018-03-05 ENCOUNTER — Other Ambulatory Visit: Payer: Self-pay | Admitting: Radiology

## 2018-03-06 ENCOUNTER — Ambulatory Visit (HOSPITAL_COMMUNITY)
Admission: RE | Admit: 2018-03-06 | Discharge: 2018-03-06 | Disposition: A | Discharge status: Home / Self Care | Payer: Self-pay | Source: Ambulatory Visit | Attending: Interventional Radiology | Admitting: Interventional Radiology

## 2018-03-06 ENCOUNTER — Encounter (HOSPITAL_COMMUNITY): Payer: Self-pay

## 2018-03-06 ENCOUNTER — Other Ambulatory Visit (HOSPITAL_COMMUNITY): Payer: Self-pay | Admitting: Interventional Radiology

## 2018-03-06 DIAGNOSIS — I639 Cerebral infarction, unspecified: Secondary | ICD-10-CM

## 2018-03-06 DIAGNOSIS — Z7984 Long term (current) use of oral hypoglycemic drugs: Secondary | ICD-10-CM | POA: Insufficient documentation

## 2018-03-06 DIAGNOSIS — Z87891 Personal history of nicotine dependence: Secondary | ICD-10-CM | POA: Insufficient documentation

## 2018-03-06 DIAGNOSIS — Z7982 Long term (current) use of aspirin: Secondary | ICD-10-CM | POA: Insufficient documentation

## 2018-03-06 DIAGNOSIS — T82856A Stenosis of peripheral vascular stent, initial encounter: Secondary | ICD-10-CM | POA: Insufficient documentation

## 2018-03-06 DIAGNOSIS — I6609 Occlusion and stenosis of unspecified middle cerebral artery: Secondary | ICD-10-CM | POA: Insufficient documentation

## 2018-03-06 DIAGNOSIS — Z48812 Encounter for surgical aftercare following surgery on the circulatory system: Secondary | ICD-10-CM | POA: Insufficient documentation

## 2018-03-06 DIAGNOSIS — Z7902 Long term (current) use of antithrombotics/antiplatelets: Secondary | ICD-10-CM | POA: Insufficient documentation

## 2018-03-06 DIAGNOSIS — E119 Type 2 diabetes mellitus without complications: Secondary | ICD-10-CM | POA: Insufficient documentation

## 2018-03-06 DIAGNOSIS — I10 Essential (primary) hypertension: Secondary | ICD-10-CM | POA: Insufficient documentation

## 2018-03-06 DIAGNOSIS — Y831 Surgical operation with implant of artificial internal device as the cause of abnormal reaction of the patient, or of later complication, without mention of misadventure at the time of the procedure: Secondary | ICD-10-CM | POA: Insufficient documentation

## 2018-03-06 HISTORY — PX: IR ANGIO VERTEBRAL SEL VERTEBRAL BILAT MOD SED: IMG5369

## 2018-03-06 HISTORY — PX: IR ANGIO INTRA EXTRACRAN SEL COM CAROTID INNOMINATE BILAT MOD SED: IMG5360

## 2018-03-06 LAB — BASIC METABOLIC PANEL
ANION GAP: 7 (ref 5–15)
BUN: 11 mg/dL (ref 6–20)
CALCIUM: 9.3 mg/dL (ref 8.9–10.3)
CO2: 25 mmol/L (ref 22–32)
Chloride: 108 mmol/L (ref 101–111)
Creatinine, Ser: 0.52 mg/dL (ref 0.44–1.00)
GFR calc Af Amer: >60 mL/min (ref >60)
Glucose, Bld: 144 mg/dL — ABNORMAL HIGH (ref 65–99)
Potassium: 4.1 mmol/L (ref 3.5–5.1)
SODIUM: 140 mmol/L (ref 135–145)

## 2018-03-06 LAB — PROTIME-INR
INR: 0.92
PROTHROMBIN TIME: 12.3 s (ref 11.4–15.2)

## 2018-03-06 LAB — CBC
HCT: 42.8 % (ref 36.0–46.0)
HEMOGLOBIN: 13.9 g/dL (ref 12.0–15.0)
MCH: 29.6 pg (ref 26.0–34.0)
MCHC: 32.5 g/dL (ref 30.0–36.0)
MCV: 91.1 fL (ref 78.0–100.0)
Platelets: 328 10*3/uL (ref 150–400)
RBC: 4.7 MIL/uL (ref 3.87–5.11)
RDW: 12.3 % (ref 11.5–15.5)
WBC: 9.1 10*3/uL (ref 4.0–10.5)

## 2018-03-06 MED ORDER — MIDAZOLAM HCL 2 MG/2ML IJ SOLN
INTRAMUSCULAR | Status: AC
  Filled 2018-03-06: qty 2

## 2018-03-06 MED ORDER — SODIUM CHLORIDE 0.9 % IV SOLN
Freq: Once | INTRAVENOUS | Status: AC
  Administered 2018-03-06: 07:00:00 via INTRAVENOUS

## 2018-03-06 MED ORDER — SODIUM CHLORIDE 0.9 % IV SOLN: INTRAVENOUS | Status: AC

## 2018-03-06 MED ORDER — MIDAZOLAM HCL 2 MG/2ML IJ SOLN
INTRAMUSCULAR | Status: AC | PRN
  Administered 2018-03-06: 1 mg via INTRAVENOUS

## 2018-03-06 MED ORDER — LIDOCAINE HCL (PF) 1 % IJ SOLN
INTRAMUSCULAR | Status: AC | PRN
  Administered 2018-03-06: 15 mL

## 2018-03-06 MED ORDER — HEPARIN SODIUM (PORCINE) 1000 UNIT/ML IJ SOLN
INTRAMUSCULAR | Status: AC | PRN
  Administered 2018-03-06: 1000 [IU] via INTRAVENOUS

## 2018-03-06 MED ORDER — FENTANYL CITRATE (PF) 100 MCG/2ML IJ SOLN
INTRAMUSCULAR | Status: AC | PRN
  Administered 2018-03-06 (×2): 25 ug via INTRAVENOUS

## 2018-03-06 MED ORDER — LIDOCAINE HCL 1 % IJ SOLN
INTRAMUSCULAR | Status: AC
  Filled 2018-03-06: qty 20

## 2018-03-06 MED ORDER — IOPAMIDOL (ISOVUE-300) INJECTION 61%
INTRAVENOUS | Status: AC
  Filled 2018-03-06: qty 50

## 2018-03-06 MED ORDER — FENTANYL CITRATE (PF) 100 MCG/2ML IJ SOLN
INTRAMUSCULAR | Status: AC
  Filled 2018-03-06: qty 2

## 2018-03-06 MED ORDER — HEPARIN SODIUM (PORCINE) 1000 UNIT/ML IJ SOLN
INTRAMUSCULAR | Status: AC
  Filled 2018-03-06: qty 1

## 2018-03-06 MED ORDER — IOHEXOL 300 MG/ML  SOLN
150.0000 mL | Freq: Once | INTRAMUSCULAR | Status: AC | PRN
  Administered 2018-03-06: 75 mL via INTRA_ARTERIAL

## 2018-03-06 NOTE — Discharge Instructions (Addendum)
Cerebral Angiogram °Cerebral angiogram, also called cerebral angiography, is an imaging test. It uses X-ray or digital images and colored dye (iodine solution) that is called contrast. An angiogram is done to look at blood vessels in the brain and neck. It helps to detect any abnormalities in the vessels that might affect blood flow. °During the procedure, the physician will inject medicine into an area in your groin or arm to numb it. The physician will make a tiny incision in the groin or arm. A thin tube (catheter) will be slipped into an artery and moved to the brain or neck. The contrast will then be injected into the catheter and images will be taken of the area. °Tell a health care provider about: °· Any allergies you have, particularly shellfish. °· All medicines you are taking, including vitamins, herbs, eye drops, creams, and over-the-counter medicines. °· Any problems you or family members have had with anesthetic medicines. °· Any reactions you have had to contrast dye or iodine. °· Any blood disorders you have. °· Any surgeries you have had. °· Medical conditions you have, including pregnancy or the possibility of pregnancy. °· If you are currently breastfeeding. °What are the risks? °Generally, this is a safe procedure. However, problems can occur and include: °· Allergic reaction to contrast material or anesthesia. °· Damage to surrounding nerves, tissues, or structures. °· Bleeding or bruising. °· Blood clot. °· Infection. °· Inability to remember what happened (amnesia) (usually temporary). °· Weakness, numbness, speech, or vision problems (usually temporary). °· Stroke. °· Kidney injury. ° °What happens before the procedure? °· You may have: °? A physical exam. °? Blood and urine tests. °? Imaging tests, including X-rays or MRIs. °· Ask your health care provider about: °? Changing or stopping your regular medicines. This is especially important if you are taking diabetes medicines or blood  thinners. °? Taking medicines such as aspirin or ibuprofen. These medicines can thin your blood. Do not take these medicines before your procedure if your health care provider asks you not to. °· Do not eat or drink anything after midnight on the night before the procedure or as directed by your health care provider. °What happens during the procedure? °· You will lie on your back on an imaging bed with a C-shaped machine around you. °· Your head will be secured to the bed with a strap or device to help you keep still. °· You will be given medicine to help you relax (sedative) through an IV in your arm. °· Your heart rate and other vital signals will be watched carefully. °· Cleaning and numbing medicine (local anesthetic) will be applied to your skin where the insertion will take place. This is usually your groin, leg, or arm. °· The radiologist will make a small incision. °· The catheter will be inserted into an artery that leads to the head. You may feel slight pressure. °· The catheter will be moved through the body all the way up to your neck and brain. Images will help your health care provider bring the catheter to the correct location. °· The dye will be injected into the catheter and will travel to your head or neck area. You may feel a warming or burning sensation or notice a strange taste as the dye goes through your system. °· Images will be taken of how the dye flows through the area. It is normal to hear beeping noises and machines during the procedure. °· While the images are being taken, you   may be given instructions on breathing, swallowing, moving, or talking. °· When the images are finished, the catheter will be slowly removed. °· Pressure will be applied to the skin to stop any bleeding. A tight bandage (dressing) or seal will be applied to the skin. °· Your IV line will be removed. °What happens after the procedure? °· You will stay in a recovery area until the medicine has worn off. Your blood  pressure and pulse will be observed. °· You will be asked to lie flat and to keep your arm or leg straight in the recovery room. °· The insertion site will be watched for bleeding and you will be checked often. °· You may feel tired. °· You may feel tenderness or notice bruising at the insertion site. °This information is not intended to replace advice given to you by your health care provider. Make sure you discuss any questions you have with your health care provider. °Document Released: 02/03/2014 Document Revised: 02/25/2016 Document Reviewed: 10/02/2013 °Elsevier Interactive Patient Education © 2017 Elsevier Inc. °Moderate Conscious Sedation, Adult, Care After °These instructions provide you with information about caring for yourself after your procedure. Your health care provider may also give you more specific instructions. Your treatment has been planned according to current medical practices, but problems sometimes occur. Call your health care provider if you have any problems or questions after your procedure. °What can I expect after the procedure? °After your procedure, it is common: °· To feel sleepy for several hours. °· To feel clumsy and have poor balance for several hours. °· To have poor judgment for several hours. °· To vomit if you eat too soon. ° °Follow these instructions at home: °For at least 24 hours after the procedure: ° °· Do not: °? Participate in activities where you could fall or become injured. °? Drive. °? Use heavy machinery. °? Drink alcohol. °? Take sleeping pills or medicines that cause drowsiness. °? Make important decisions or sign legal documents. °? Take care of children on your own. °· Rest. °Eating and drinking °· Follow the diet recommended by your health care provider. °· If you vomit: °? Drink water, juice, or soup when you can drink without vomiting. °? Make sure you have little or no nausea before eating solid foods. °General instructions °· Have a responsible adult  stay with you until you are awake and alert. °· Take over-the-counter and prescription medicines only as told by your health care provider. °· If you smoke, do not smoke without supervision. °· Keep all follow-up visits as told by your health care provider. This is important. °Contact a health care provider if: °· You keep feeling nauseous or you keep vomiting. °· You feel light-headed. °· You develop a rash. °· You have a fever. °Get help right away if: °· You have trouble breathing. °This information is not intended to replace advice given to you by your health care provider. Make sure you discuss any questions you have with your health care provider. °Document Released: 07/10/2013 Document Revised: 02/22/2016 Document Reviewed: 01/09/2016 °Elsevier Interactive Patient Education © 2018 Elsevier Inc. ° °

## 2018-03-06 NOTE — Progress Notes (Signed)
Slight amount of blood on gauze of the right groin, pam turpin pa assessed, pt ok to go home now, pressure dressing applied.

## 2018-03-06 NOTE — Procedures (Signed)
S/P 4 vessel cweerebral arteriograms. RT CFA approach. Findings. 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

## 2018-03-06 NOTE — Sedation Documentation (Signed)
Dr Mannie Stabile told RN to take CO2/ O2 supply monitor off for the rest of procedure because it was blocking his field of view. Will continue to monitor.

## 2018-03-06 NOTE — H&P (Signed)
Chief Complaint: Patient was seen in consultation today for cerebral arteriogram at the request of Dr Arrie Eastern   Supervising Physician: Luanne Bras  Patient Status: [facility] - Out-pt  History of Present Illness: Heather [name] is a 62 y.o. female   Previous R Middle cerebral artery revascularization/stent 12/18/17 Consultation/follow up with Dr Estanislado Pandy: 01/31/2018 Has no complaints Denies vision/speech changes Denies headaches Denies dizziness; unstable gait  Scheduled now for follow up cerebral arteriogram  Past Medical History:  Diagnosis Date  . Hypertension     Past Surgical History:  Procedure Laterality Date  . BREAST EXCISIONAL BIOPSY    . BREAST SURGERY     right breast  . CHOLECYSTECTOMY    . IR CT HEAD LTD  12/18/2017  . IR INTRA CRAN STENT  12/18/2017  . IR PERCUTANEOUS ART THROMBECTOMY/INFUSION INTRACRANIAL INC DIAG ANGIO  12/18/2017  . IR RADIOLOGIST EVAL & MGMT  01/31/2018  . RADIOLOGY WITH ANESTHESIA N/A 12/18/2017   Procedure: RADIOLOGY WITH ANESTHESIA;  Surgeon: Luanne Bras, MD;  Location: [place];  Service: Radiology;  Laterality: N/A;    Allergies: Patient has no known allergies.  Medications: Prior to Admission medications   Medication Sig Start Date End Date Taking? Authorizing Provider  aspirin 81 MG chewable tablet Chew 1 tablet (81 mg total) by mouth daily. 12/27/17  Yes Angiulli, Lavon Paganini, PA-C  glimepiride (AMARYL) 1 MG tablet Take 1 tablet (1 mg total) by mouth daily with breakfast. 01/25/18  Yes Ena Dawley, Tiffany S, PA-C  ticagrelor (BRILINTA) 90 MG TABS tablet Take 1 tablet (90 mg total) by mouth 2 (two) times daily. 01/25/18  Yes Ena Dawley, Tiffany S, PA-C  Blood Glucose Monitoring Suppl (TRUE METRIX METER) DEVI 1 kit by Does not apply route 4 (four) times daily. 01/12/18   Brayton Caves, PA-C  glucose blood (TRUE METRIX BLOOD GLUCOSE TEST) test strip Use as instructed 01/12/18   Brayton Caves, PA-C  lisinopril (PRINIVIL,ZESTRIL) 2.5 MG tablet  Take 1 tablet (2.5 mg total) by mouth daily. 02/20/18   Ladell Pier, MD  sertraline (ZOLOFT) 50 MG tablet 1/2 tab PO daily x 2 weeks then 1 tab daily Patient taking differently: Take by mouth See admin instructions. 1/2 tab PO daily x 2 weeks then 1 tab daily 02/20/18   Ladell Pier, MD  TRUEPLUS LANCETS 28G MISC 28 g by Does not apply route QID. 01/12/18   Brayton Caves, PA-C     Family History  Problem Relation Age of Onset  . Diabetes Mother   . Pulmonary disease Mother   . Heart disease Father     Social History   Socioeconomic History  . Marital status: Single    Spouse name: Not on file  . Number of children: Not on file  . Years of education: Not on file  . Highest education level: Not on file  Occupational History  . Not on file  Social Needs  . Financial resource strain: Not on file  . Food insecurity:    Worry: Not on file    Inability: Not on file  . Transportation needs:    Medical: Not on file    Non-medical: Not on file  Tobacco Use  . Smoking status: Former Smoker    Packs/day: 0.50    Years: 30.00    Pack years: 15.00    Types: Cigarettes    Last attempt to quit: 12/04/2017    Years since quitting: 0.2  . Smokeless tobacco: Never Used  Substance and Sexual Activity  . Alcohol use: Yes    Comment: weekly  . Drug use: No  . Sexual activity: Not Currently    Birth control/protection: None  Lifestyle  . Physical activity:    Days per week: Not on file    Minutes per session: Not on file  . Stress: Not on file  Relationships  . Social connections:    Talks on phone: Not on file    Gets together: Not on file    Attends religious service: Not on file    Active member of club or organization: Not on file    Attends meetings of clubs or organizations: Not on file    Relationship status: Not on file  Other Topics Concern  . Not on file  Social History Narrative  . Not on file    Review of Systems: A 12 point ROS discussed and pertinent  positives are indicated in the HPI above.  All other systems are negative.  Review of Systems  Constitutional: Negative for activity change, fatigue and fever.  HENT: Negative for tinnitus and trouble swallowing.   Eyes: Negative for visual disturbance.  Respiratory: Negative for cough and shortness of breath.   Cardiovascular: Negative for chest pain.  Gastrointestinal: Negative for abdominal pain, nausea and vomiting.  Musculoskeletal: Negative for back pain and gait problem.  Neurological: Negative for dizziness, tremors, seizures, syncope, facial asymmetry, speech difficulty, weakness, light-headedness, numbness and headaches.  Psychiatric/Behavioral: Negative for behavioral problems and confusion.    Vital Signs: BP 125/78   Pulse 69   Temp 97.8 F (36.6 C) (Oral)   Resp 18   Ht '5\' 7"'  (1.702 m)   Wt 249 lb (112.9 kg)   SpO2 99%   BMI 39.00 kg/m   Physical Exam  Constitutional: She is oriented to person, place, and time. She appears well-nourished.  Eyes: EOM are normal.  Neck: Normal range of motion. Neck supple.  Cardiovascular: Normal rate, regular rhythm and normal heart sounds.  No murmur heard. Pulmonary/Chest: Effort normal and breath sounds normal. She has no wheezes.  Abdominal: Soft. Bowel sounds are normal. There is no tenderness.  Musculoskeletal: Normal range of motion. She exhibits no edema or deformity.  Neurological: She is alert and oriented to person, place, and time.  Skin: Skin is warm and dry. No rash noted.  Psychiatric: She has a normal mood and affect. Her behavior is normal. Judgment and thought content normal.  Nursing note and vitals reviewed.   Imaging: No results found.  Labs:  CBC: Recent Labs    12/18/17 0405 12/19/17 0513 12/20/17 0738 03/06/18 0630  WBC 9.8 10.9* 10.5 9.1  HGB 12.3 12.3 13.4 13.9  HCT 37.6 37.8 41.3 42.8  PLT 226 237 250 328    COAGS: Recent Labs    12/17/17 2255 03/06/18 0630  INR 0.89 0.92  APTT  28  --     BMP: Recent Labs    12/18/17 0405 12/19/17 0513 12/20/17 0738 03/06/18 0630  NA 139 138 137 140  K 4.4 3.5 4.0 4.1  CL 110 104 103 108  CO2 21* '25 26 25  ' GLUCOSE 214* 184* 209* 144*  BUN 10 <5* 7 11  CALCIUM 7.7* 8.6* 9.0 9.3  CREATININE 0.54 0.39* 0.44 0.52  GFRNONAA >60 >60 >60 >60  GFRAA >60 >60 >60 >60    LIVER FUNCTION TESTS: Recent Labs    10/18/17 1614 12/17/17 2255 12/20/17 0738  BILITOT 0.5 0.3 0.8  AST 26  21 25  ALT '26 23 22  ' ALKPHOS 97 76 75  PROT 7.6 6.5 6.7  ALBUMIN 4.2 3.5 3.4*    TUMOR MARKERS: No results for input(s): AFPTM, CEA, CA199, CHROMGRNA in the last 8760 hours.  Assessment and Plan:  R MCA revascularization/stent 12/18/17 Doing well Denies any neurologic deficit Scheduled now for follow up cerebral arteriogram Risks and benefits of cerebral angiogram with intervention were discussed with the patient including, but not limited to bleeding, infection, vascular injury, contrast induced renal failure, stroke or even death.  This interventional procedure involves the use of X-rays and because of the nature of the planned procedure, it is possible that we will have prolonged use of X-ray fluoroscopy.  Potential radiation risks to you include (but are not limited to) the following: - A slightly elevated risk for cancer  several years later in life. This risk is typically less than 0.5% percent. This risk is low in comparison to the normal incidence of human cancer, which is 33% for women and 50% for men according to the [place]. - Radiation induced injury can include skin redness, resembling a rash, tissue breakdown / ulcers and hair loss (which can be temporary or permanent).   The likelihood of either of these occurring depends on the difficulty of the procedure and whether you are sensitive to radiation due to previous procedures, disease, or genetic conditions.   IF your procedure requires a prolonged use of  radiation, you will be notified and given written instructions for further action.  It is your responsibility to monitor the irradiated area for the 2 weeks following the procedure and to notify your physician if you are concerned that you have suffered a radiation induced injury.    All of the patient's questions were answered, patient is agreeable to proceed.  Consent signed and in chart.  Thank you for this interesting consult.  I greatly enjoyed meeting Areta Terwilliger and look forward to participating in their care.  A copy of this report was sent to the requesting provider on this date.  Electronically Signed: Lavonia Drafts, PA-C 03/06/2018, 7:45 AM   I spent a total of    25 Minutes in face to face in clinical consultation, greater than 50% of which was counseling/coordinating care for cerebral arteriogram

## 2018-03-06 NOTE — Progress Notes (Signed)
Patient ID: Heather [name], female   DOB: [birth year], 62 y.o.   MRN: [id number]   Called by RN to check groin site before DC. RN says pt had had some oozing at site over an hr ago. (pt on Brilinta) New dressing was applied.  Pt has maybe dime size blood stain on dressing placed 1- 1.5 hrs ago. Site is soft NT; no hematoma Rt foot 2 + pulses  Order for new pressure dressing now May DC home after dressing applied

## 2018-03-07 ENCOUNTER — Encounter (HOSPITAL_COMMUNITY): Payer: Self-pay | Admitting: Interventional Radiology

## 2018-03-08 ENCOUNTER — Ambulatory Visit: Payer: Self-pay | Admitting: Neurology

## 2018-03-08 ENCOUNTER — Telehealth: Payer: Self-pay

## 2018-03-08 NOTE — Telephone Encounter (Signed)
Patient no show for appt today. 

## 2018-03-09 ENCOUNTER — Encounter: Payer: Self-pay | Admitting: Neurology

## 2018-03-09 ENCOUNTER — Ambulatory Visit: Payer: Self-pay | Attending: Internal Medicine

## 2018-03-27 ENCOUNTER — Other Ambulatory Visit: Payer: Self-pay

## 2018-03-30 ENCOUNTER — Other Ambulatory Visit: Payer: Self-pay

## 2018-04-02 ENCOUNTER — Other Ambulatory Visit: Payer: Self-pay

## 2018-04-19 ENCOUNTER — Ambulatory Visit: Payer: Self-pay | Admitting: Internal Medicine

## 2018-04-24 ENCOUNTER — Ambulatory Visit: Payer: Self-pay | Admitting: Internal Medicine

## 2018-05-08 ENCOUNTER — Encounter: Payer: Self-pay | Admitting: Internal Medicine

## 2018-05-08 ENCOUNTER — Ambulatory Visit: Payer: Self-pay | Attending: Internal Medicine | Admitting: Internal Medicine

## 2018-05-08 VITALS — BP 120/80 | HR 77 | Temp 98.3°F | Resp 16 | Wt 239.2 lb

## 2018-05-08 DIAGNOSIS — Z87891 Personal history of nicotine dependence: Secondary | ICD-10-CM | POA: Insufficient documentation

## 2018-05-08 DIAGNOSIS — F329 Major depressive disorder, single episode, unspecified: Secondary | ICD-10-CM

## 2018-05-08 DIAGNOSIS — Z6839 Body mass index (BMI) 39.0-39.9, adult: Secondary | ICD-10-CM | POA: Insufficient documentation

## 2018-05-08 DIAGNOSIS — Z7982 Long term (current) use of aspirin: Secondary | ICD-10-CM | POA: Insufficient documentation

## 2018-05-08 DIAGNOSIS — E119 Type 2 diabetes mellitus without complications: Secondary | ICD-10-CM | POA: Insufficient documentation

## 2018-05-08 DIAGNOSIS — E669 Obesity, unspecified: Secondary | ICD-10-CM

## 2018-05-08 DIAGNOSIS — F32A Depression, unspecified: Secondary | ICD-10-CM

## 2018-05-08 DIAGNOSIS — Z7984 Long term (current) use of oral hypoglycemic drugs: Secondary | ICD-10-CM | POA: Insufficient documentation

## 2018-05-08 DIAGNOSIS — I63411 Cerebral infarction due to embolism of right middle cerebral artery: Secondary | ICD-10-CM

## 2018-05-08 DIAGNOSIS — E1169 Type 2 diabetes mellitus with other specified complication: Secondary | ICD-10-CM

## 2018-05-08 DIAGNOSIS — I10 Essential (primary) hypertension: Secondary | ICD-10-CM

## 2018-05-08 DIAGNOSIS — Z8673 Personal history of transient ischemic attack (TIA), and cerebral infarction without residual deficits: Secondary | ICD-10-CM | POA: Insufficient documentation

## 2018-05-08 DIAGNOSIS — Z79899 Other long term (current) drug therapy: Secondary | ICD-10-CM | POA: Insufficient documentation

## 2018-05-08 LAB — POCT GLYCOSYLATED HEMOGLOBIN (HGB A1C): HbA1c POC (<> result, manual entry): 5.5 % (ref 4.0–5.6)

## 2018-05-08 LAB — GLUCOSE, POCT (MANUAL RESULT ENTRY): POC Glucose: 80 mg/dl (ref 70–99)

## 2018-05-08 NOTE — Progress Notes (Signed)
Patient ID: Heather [name], female    DOB: 1956-06-04  MRN: [id number]  CC: No chief complaint on file.   Subjective: Heather [name] is a 62 y.o. female who presents for chronic ds management Her concerns today include:  Hx of DM, HTN, former smoker tob dep, CVA RT MCA territory, s/p thrombectomy followed by stenting of M2 by interventional radiology  Depression:  Started on Zoloft on last visit.  She filled the rxn but did not start taking.  She was not sure if was okay to take with her other meds.  She still feels that she would benefit from being on medication for depression.  CVA:  Had cranial angiogram done 03/06/2018 which revealed 50% in stent stenosis of RT MCA.  No showed appt with neurologist but has appt for the 14th this mth.  Reports compliance with ASA and Brilinta.  DM:  Doing okay with eating habits and compliant with Amaryl.  Walking daily for about 40 mins Loss 10 lbs since last visit  HTN:  Not taking Lisinopril which was prescribed on last visit.  Patient Active Problem List   Diagnosis Date Noted  . Depression 02/21/2018  . Essential hypertension 02/21/2018  . Cerebrovascular accident (CVA) due to embolism of right middle cerebral artery ([place]) 02/21/2018  . Diabetes mellitus type 2 in obese ([place]) 12/20/2017  . Right middle cerebral artery stroke ([place]) 12/19/2017  . Left hemiparesis ([place])   . Dysphagia, oropharyngeal   . 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 aspirin 81 MG chewable tablet Chew 1 tablet (81 mg total) by mouth daily.    . Blood Glucose Monitoring Suppl (TRUE METRIX METER) DEVI 1 kit by Does not apply route 4 (four) times daily. 1 Device 0  . glimepiride (AMARYL) 1 MG tablet Take 1 tablet (1 mg total) by mouth daily with breakfast. 30 tablet 3  . glucose blood (TRUE METRIX BLOOD GLUCOSE TEST) test strip Use as instructed 100 each 12  . sertraline  (ZOLOFT) 50 MG tablet 1/2 tab PO daily x 2 weeks then 1 tab daily (Patient taking differently: Take by mouth See admin instructions. 1/2 tab PO daily x 2 weeks then 1 tab daily) 30 tablet 3  . ticagrelor (BRILINTA) 90 MG TABS tablet Take 1 tablet (90 mg total) by mouth 2 (two) times daily. 60 tablet 3  . TRUEPLUS LANCETS 28G MISC 28 g by Does not apply route QID. 120 each 3   No current facility-administered medications on file prior to visit.     No Known Allergies  Social History   Socioeconomic History  . Marital status: Single    Spouse name: Not on file  . Number of children: Not on file  . Years of education: Not on file  . Highest education level: Not on file  Occupational History  . Not on file  Social Needs  . Financial resource strain: Not on file  . Food insecurity:    Worry: Not on file    Inability: Not on file  . Transportation needs:    Medical: Not on file    Non-medical: Not on file  Tobacco Use  . Smoking status: Former Smoker    Packs/day: 0.50    Years: 30.00    Pack years: 15.00    Types: Cigarettes    Last attempt to quit: 12/04/2017    Years since quitting: 0.4  .  Smokeless tobacco: Never Used  Substance and Sexual Activity  . Alcohol use: Yes    Comment: weekly  . Drug use: No  . Sexual activity: Not Currently    Birth control/protection: None  Lifestyle  . Physical activity:    Days per week: Not on file    Minutes per session: Not on file  . Stress: Not on file  Relationships  . Social connections:    Talks on phone: Not on file    Gets together: Not on file    Attends religious service: Not on file    Active member of club or organization: Not on file    Attends meetings of clubs or organizations: Not on file    Relationship status: Not on file  . Intimate partner violence:    Fear of current or ex partner: Not on file    Emotionally abused: Not on file    Physically abused: Not on file    Forced sexual activity: Not on file  Other  Topics Concern  . Not on file  Social History Narrative  . Not on file    Family History  Problem Relation Age of Onset  . Diabetes Mother   . Pulmonary disease Mother   . Heart disease Father     Past Surgical History:  Procedure Laterality Date  . BREAST EXCISIONAL BIOPSY    . BREAST SURGERY     right breast  . CHOLECYSTECTOMY    . IR ANGIO INTRA EXTRACRAN SEL COM CAROTID INNOMINATE BILAT MOD SED  03/06/2018  . IR ANGIO VERTEBRAL SEL VERTEBRAL BILAT MOD SED  03/06/2018  . IR CT HEAD LTD  12/18/2017  . IR INTRA CRAN STENT  12/18/2017  . IR PERCUTANEOUS ART THROMBECTOMY/INFUSION INTRACRANIAL INC DIAG ANGIO  12/18/2017  . IR RADIOLOGIST EVAL & MGMT  01/31/2018  . RADIOLOGY WITH ANESTHESIA N/A 12/18/2017   Procedure: RADIOLOGY WITH ANESTHESIA;  Surgeon: Luanne Bras, MD;  Location: [place];  Service: Radiology;  Laterality: N/A;    ROS: Review of Systems Negative except as stated above. PHYSICAL EXAM: BP 120/80   Pulse 77   Temp 98.3 F (36.8 C) (Oral)   Resp 16   Wt 239 lb 3.2 oz (108.5 kg)   SpO2 97%   BMI 37.46 kg/m   Wt Readings from Last 3 Encounters:  05/08/18 239 lb 3.2 oz (108.5 kg)  03/06/18 249 lb (112.9 kg)  02/20/18 248 lb 3.2 oz (112.6 kg)   Repeat blood pressure LT arm 120/80  Physical Exam  General appearance - alert, well appearing, and in no distress Mental status - normal mood, behavior, speech, dress, motor activity, and thought processes Neck - supple, no significant adenopathy Chest - clear to auscultation, no wheezes, rales or rhonchi, symmetric air entry Heart - normal rate, regular rhythm, normal S1, S2, no murmurs, rubs, clicks or gallops Extremities - peripheral pulses normal, no pedal edema, no clubbing or cyanosis   Results for orders placed or performed in visit on 05/08/18  POCT glucose (manual entry)  Result Value Ref Range   POC Glucose 80 70 - 99 mg/dl  POCT glycosylated hemoglobin (Hb A1C)  Result Value Ref Range   Hemoglobin  A1C  4.0 - 5.6 %   HbA1c POC (<> result, manual entry) 5.5 4.0 - 5.6 %   HbA1c, POC (prediabetic range)  5.7 - 6.4 %   HbA1c, POC (controlled diabetic range)  0.0 - 7.0 %    ASSESSMENT AND PLAN: 1. Diabetes  mellitus type 2 in obese (HCC) At goal.  Continue Amaryl.  Continue regular exercise and healthy eating habits. - POCT glucose (manual entry) - POCT glycosylated hemoglobin (Hb A1C)  2. Essential hypertension Controlled off low-dose lisinopril.  She will hold off on filling the prescription  3. Cerebrovascular accident (CVA) due to embolism of right middle cerebral artery [facility]) Patient had 50% in-stent stenosis of the right MCA.  Continue aspirin and Brilinta.  Keep appointment with neurology  4. Depression, unspecified depression type Advised patient to go ahead and start the Zoloft  5. Obesity (BMI 35.0-39.9 without comorbidity) Commended her on weight loss.  Encouraged her to continue regular exercise and healthy eating habits   Patient was given the opportunity to ask questions.  Patient verbalized understanding of the plan and was able to repeat key elements of the plan.  [employer] interpreter used during this encounter.  Orders Placed This Encounter  Procedures  . POCT glucose (manual entry)  . POCT glycosylated hemoglobin (Hb A1C)     Requested Prescriptions    No prescriptions requested or ordered in this encounter    Return in about 3 months (around 08/08/2018).  Karle Plumber, MD, FACP

## 2018-05-16 ENCOUNTER — Ambulatory Visit (INDEPENDENT_AMBULATORY_CARE_PROVIDER_SITE_OTHER): Payer: Self-pay | Admitting: Neurology

## 2018-05-16 ENCOUNTER — Encounter: Payer: Self-pay | Admitting: Neurology

## 2018-05-16 VITALS — Ht 67.0 in | Wt 235.8 lb

## 2018-05-16 DIAGNOSIS — I63311 Cerebral infarction due to thrombosis of right middle cerebral artery: Secondary | ICD-10-CM

## 2018-05-16 NOTE — Patient Instructions (Signed)
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 I also advised the patient to eat a healthy diet with plenty of whole grains, cereals, fruits and vegetables, exercise regularly and maintain ideal body weight. I recommend she follow up with Dr. Corliss Skains to discuss discontinuing Brilinta after 6 months post stenting. Check transcranial Doppler study to follow her right MCA restenosis. Followup in the future with my nurse practitioner Shanda Bumps in 6 months or call earlier if necessary   Prevencin del ictus Stroke Prevention Algunas enfermedades o conductas se asocian a un aumento en el riesgo de sufrir un accidente cerebrovascular. Puede ayudar a prevenir un accidente cerebrovascular optando por una alimentacin y un estilo de vida ms saludables y haciendo otros cambios, incluyendo Chief Operating Officer cualquier afeccin mdica que tenga.  Qu cambios nutricionales se pueden realizar?  Consuma alimentos saludables. Puede hacer lo siguiente: ? Elija alimentos ricos en fibra, como frutas y verduras frescas, y cereales integrales. ? Coma 5 o ms porciones de frutas y verduras al da. Trate de que la mitad del plato de cada comida sea de frutas y verduras. ? Elija alimentos con protenas magras, como cortes de carne magros, carne de ave sin piel, pescado, tofu, frijoles y nueces. ? Coma productos lcteos descremados. ? Evite los alimentos con alto contenido de sal (sodio). Esto puede ayudar a Personal assistant presin  arterial. ? Evite los alimentos que contienen grasas saturadas, grasas trans y colesterol. Esto puede ayudar a Architectural technologist. ? Evite los alimentos procesados ??y precocinados.  Siga las pautas especficas de su mdico para perder peso, Chief Operating Officer la presin arterial alta (hipertensin), reducir el colesterol alto y controlar la diabetes. Estos pueden incluir lo siguiente: ? Reducir su ingesta calrica diaria. ? Limitar su consumo diario de sodio a 1500 miligramos (mg). ? Usar solo aceites saludables para cocinar, como el aceite de [place], [place] o [place]. ? Contar su consumo diario de carbohidratos. Qu cambios en el estilo de vida se pueden realizar?  Mantenga un peso saludable. Hable con el mdico sobre su peso ideal.  Omro por lo menos de actividad fsica moderada 5 das por [place]. Actividad fsica moderada incluye caminar rpido, andar en bicicleta y nadar.  No consuma ningn producto que contenga nicotina o tabaco, como cigarrillos y Administrator, Civil Service. Si necesita ayuda para dejar de fumar, consulte al [employer]. Tambin puede ser til evitar la exposicin al humo de [place].  Limite el consumo de alcohol a no ms de por da si es mujer y no est Kellogg, y por da si es hombre. Una medida equivale a 12oz ( ) de cerveza, 5oz ( ) de vino o 1oz (65ml) de bebidas alcohlicas de alta graduacin.  Detenga el consumo de drogas ilegales.  Evite tomar pldoras anticonceptivas. Hable con su mdico [employer] de tomar pldoras anticonceptivas si: ? Es mayor de 35aos. ? Fuma. ? Tiene migraas. ? Alguna vez ha tenido un cogulo de [place]. Qu otros cambios se pueden realizar?  Controle los niveles de [place]. ? Llevar una dieta saludable es importante para prevenir el colesterol  alto. Si el colesterol no puede controlarse nicamente con la dieta, es posible que tambin deba tomar medicamentos. ? [employer] para Public house manager como se lo haya indicado su mdico.  Controle su diabetes. ? Llevar una dieta saludable y hacer ejercicio regularmente son partes importantes del control de su nivel de Banker. Si su nivel de azcar en la sangre no puede controlarse mediante dieta y ejercicio, es posible que deba tomar medicamentos. ? [employer] para controlar la diabetes como se lo haya indicado su mdico.  Controle su hipertensin. ? Para reducir su riesgo de accidente cerebrovascular, trate de mantener su presin arterial por debajo de 130/80. ? Llevar una dieta saludable y hacer ejercicio regularmente son partes importantes del control de su presin arterial. Si su presin arterial no puede controlarse mediante dieta y ejercicio, es posible que deba tomar medicamentos. ? [employer] para controlar la hipertensin como se lo haya indicado su mdico. ? Pregntele a su mdico si debera medirse su presin arterial en el hogar. ? Controle su presin arterial todos los aos, incluso si su presin arterial es normal. La presin arterial aumenta con la edad y algunas afecciones mdicas.  Hgase una evaluacin de los trastornos del sueo (apnea del sueo). Hable con su mdico sobre cmo hacer una evaluacin del sueo si ronca mucho o tiene excesiva somnolencia.  Tome los medicamentos de venta libre y los recetados solamente como se lo haya indicado el mdico. Es posible que se le recomiende tomar aspirina o un diluyente de la sangre (antiplaquetario o Environmental education officer) para reducir Nurse, adult de formacin de cogulos de sangre que pueden provocar una accidente cerebrovascular.  Asegrese de tener bajo control cualquier otra afeccin mdica que tenga, como la fibrilacin auricular o la aterosclerosis. Cules son las seales de alerta de un accidente cerebrovascular? Los signos de advertencia de un accidente cerebrovascular se pueden recordar fcilmente como:  "BEFAST".  B es por "balance" (equilibrio). Algunos signos son los siguientes: ? Mareos. ? Prdida del equilibrio o de la coordinacin. ? Dificultad repentina para caminar.  E es por "eyes" (ojos). Algunos signos son los siguientes: ? Un cambio repentino en la visin. ? Dificultad para ver.  F es por "face" (rostro). Algunos signos son los siguientes: ? Debilidad o adormecimiento del rostro. ? Su rostro o prpado se caen hacia un lado.  A es por "arms" (brazo). Algunos signos son los siguientes: ? Debilidad o adormecimiento sbito del brazo, generalmente en un lado del cuerpo.  S es por "speech" (habla). Algunos signos son los siguientes: ? Dificultad para hablar (afasia). ? Dificultad para comprender.  T es por "time" Animal nutritionist). ? Estos sntomas pueden representar un problema grave que constituye Radio broadcast assistant. No espere hasta que los sntomas desaparezcan. Solicite atencin mdica de inmediato. Llame a su servicio de Marine scientist (911 en los [place]). No conduzca por sus propios medios [employer] hospital.  Otros signos de un accidente cerebrovascular pueden incluir: ? Dolor de cabeza sbito e intenso que no tiene causa aparente. ? Nuseas o vmitos. ? Convulsiones.  Dnde encontrar ms informacin: Para ms informacin, visite el siguiente enlace:  American Stroke Association (Asociacin Americana de Accidente Cerebrovascular): www.strokeassociation.org  National Stroke Association Engineering geologist de Accidente Cerebrovascular): www.stroke.org  Resumen  Puede prevenir un accidente cerebrovascular optando por una alimentacin saludable, hacer ejercicio, no fumar, limitar el consumo de alcohol, y manteniendo bajo control cualquier afeccin mdica que tenga.  No consuma ningn producto que contenga  nicotina o tabaco, como cigarrillos y cigarrillos electrnicos. Si necesita ayuda para dejar de fumar, consulte al American Express. Tambin puede ser til evitar la exposicin  al humo de [place].  Recuerde las seales de alerta de un accidente cerebrovascular "BEFAST". Obtenga ayuda de inmediato si usted o un ser querido presenta alguna de estas seales de Control and instrumentation engineer. Esta informacin no tiene Theme park manager el consejo del mdico. Asegrese de hacerle al mdico cualquier pregunta que tenga. Document Released: 09/19/2005 Document Revised: 01/31/2017 Document Reviewed: 01/31/2017 Elsevier Interactive Patient Education  2018 [employer].

## 2018-05-16 NOTE — Progress Notes (Signed)
[facility] [address] [place]. [place] [address] [phone]       OFFICE FOLLOW-UP NOTE  Heather [name]. Heather [name] [name] Date of Birth:  [birth year] Medical Record Number:  [id number]   HPI: Heather [name] [name] is a 62 year old pleasant Hispanic lady who seen today for first office follow-up visit following hospital admission for stroke in March 2019. She is accompanied by her daughter as well as Spanish language interpreter was present throughout this visit. History is obtained from them as well as review of electronic medical records. I have personally reviewed imaging films.Heather [name] [name] an [name] with PMH of HTN,Obesity who presents Heather [name] [name] emergency room as a stroke alert with left hemiparesis. Patient is Spanish-speaking and initially EMS was told that her symptoms began at 9 PM after she dropped a glass in her left hand. CT head showed no hemorrhage and tPA was mixed. However after nurse spoke to her in [place], patient stated that she had a headache that began around 6:30pm and had left side weakness as she was outside window period upon completion of CT scan.  CTA head and neck was performed which showed a superior division M2 cutoff. After discussing with family given her borderline NIHSS of 7, however worsening symptoms ( from EMS assessment to arrival at hospital) we decided to take the patient for mechanical thrombectomy. Date last known well:3.17.19Time last known well:around 6.30 pm tPA Given:no, outside window at time of CT scan NIHSS:7 ( at time of arrival) Baseline MRS0  . After written informed consent from the patient and family She was taken to the interventional radiology suite for thrombectomy and right middle cerebral artery  Rescue stent placement performed by Dr. Estanislado Pandy for progressive occlusion of the inferior division of the right middle cerebral artery due to underlying stenosis. She also received 10 mg of superselective into arterial  Integrilin. She was given loading dose of 180 mg of Brilinta and aspirin. The patient was initially and in the neurological intensive care unit and closely monitored she did well. MRI scan of the brain showed a moderate-sized right MCA infarct.Marland Kitchen LDL cholesterol 67 mg percent and women A1c was 6.7. Transthoracic echo showed normal ejection fraction. Patient had initial right gaze preference and left hemiplegia but made gradual improvement. She was seen by physical occupational and speech therapy. She was transferred to inpatient rehabilitation and has done well. She is currently living at home. She is able to ambulate without assistance. She has only mild diminished fine motor skills in the left hand but otherwise appears to have made a full recovery. She feels she is 85% better. She does complain of decreased stamina and gets tired easily. She is eating healthy and is active and in fact has lost weight. Her fasting sugars have all ranged below 150 and blood pressure is well controlled and it is 106/80 today. She did undergo follow-up cerebral catheter angiogram by Dr. Estanislado Pandy on 03/06/18 which I have personally reviewed showed 50% right middle cerebral artery IntraStent restenosis but good flow distally.  ROS:   14 system review of systems is positive for  weight loss, fatigue, palpitations, eye pain, shortness of breath, easy bruising, joint pain and all other systems negative  PMH:  Past Medical History:  Diagnosis Date  . Hypertension   . Stroke [facility])     Social History:  Social History   Socioeconomic History  . Marital status: Single    Spouse name: Not on file  . Number of children: Not on file  .  Years of education: Not on file  . Highest education level: Not on file  Occupational History  . Not on file  Social Needs  . Financial resource strain: Not on file  . Food insecurity:    Worry: Not on file    Inability: Not on file  . Transportation needs:    Medical: Not on file     Non-medical: Not on file  Tobacco Use  . Smoking status: Former Smoker    Packs/day: 0.50    Years: 30.00    Pack years: 15.00    Types: Cigarettes    Last attempt to quit: 12/04/2017    Years since quitting: 0.4  . Smokeless tobacco: Never Used  Substance and Sexual Activity  . Alcohol use: Not Currently    Comment: weekly  . Drug use: No  . Sexual activity: Not Currently    Birth control/protection: None  Lifestyle  . Physical activity:    Days per week: Not on file    Minutes per session: Not on file  . Stress: Not on file  Relationships  . Social connections:    Talks on phone: Not on file    Gets together: Not on file    Attends religious service: Not on file    Active member of club or organization: Not on file    Attends meetings of clubs or organizations: Not on file    Relationship status: Not on file  . Intimate partner violence:    Fear of current or ex partner: Not on file    Emotionally abused: Not on file    Physically abused: Not on file    Forced sexual activity: Not on file  Other Topics Concern  . Not on file  Social History Narrative  . Not on file    Medications:   Current Outpatient Medications on File Prior to Visit  Medication Sig Dispense Refill  . aspirin 81 MG chewable tablet Chew 1 tablet (81 mg total) by mouth daily.    . Blood Glucose Monitoring Suppl (TRUE METRIX METER) DEVI 1 kit by Does not apply route 4 (four) times daily. 1 Device 0  . glimepiride (AMARYL) 1 MG tablet Take 1 tablet (1 mg total) by mouth daily with breakfast. 30 tablet 3  . glucose blood (TRUE METRIX BLOOD GLUCOSE TEST) test strip Use as instructed 100 each 12  . sertraline (ZOLOFT) 50 MG tablet 1/2 tab PO daily x 2 weeks then 1 tab daily (Patient taking differently: Take by mouth See admin instructions. 1/2 tab PO daily x 2 weeks then 1 tab daily) 30 tablet 3  . ticagrelor (BRILINTA) 90 MG TABS tablet Take 1 tablet (90 mg total) by mouth 2 (two) times daily. 60 tablet 3    . TRUEPLUS LANCETS 28G MISC 28 g by Does not apply route QID. 120 each 3   No current facility-administered medications on file prior to visit.     Allergies:  No Known Allergies  Physical Exam General: well developed, well nourished pleasant middle-age  Hispanic lady, seated, in no evident distress Head: head normocephalic and atraumatic.  Neck: supple with no carotid or supraclavicular bruits Cardiovascular: regular rate and rhythm, no murmurs Musculoskeletal: no deformity Skin:  no rash/petichiae Vascular:  Normal pulses all extremities There were no vitals filed for this visit. Neurologic Exam Mental Status: Awake and fully alert. Oriented to place and time. Recent and remote memory intact. Attention span, concentration and fund of knowledge appropriate. Mood and affect appropriate.  Cranial Nerves: Fundoscopic exam reveals sharp disc margins. Pupils equal, briskly reactive to light. Extraocular movements full without nystagmus. Visual fields full to confrontation. Hearing intact. Facial sensation intact. Face, tongue, palate moves normally and symmetrically.  Motor: Normal bulk and tone. Normal strength in all tested extremity muscles. Mild weakness of left grip and intrinsic hand muscles. Diminished fine finger movements on the left. Orbits right over left upper extremity. Sensory.: intact to touch ,pinprick .position and vibratory sensation.  Coordination: Rapid alternating movements normal in all extremities. Finger-to-nose and heel-to-shin performed accurately bilaterally. Gait and Station: Arises from chair without difficulty. Stance is normal. Gait demonstrates normal stride length and balance . Able to heel, toe and tandem walk without difficulty.  Reflexes: 1+ and symmetric. Toes downgoing.   NIHSS  0 Modified Rankin  2   ASSESSMENT: 62 year old Hispanic lady with right MCA infarct in March 2019 secondary to right MCA stenosis treated with acute angioplasty and stenting  with excellent clinical recovery. 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 I also advised the patient to eat a healthy diet with plenty of whole grains, cereals, fruits and vegetables, exercise regularly and maintain ideal body weight. I recommend she follow up with Dr. Estanislado Pandy to discuss discontinuing Brilinta after 6 months post stenting. Check transcranial Doppler study to follow her right MCA restenosis. Followup in the future with my nurse practitioner Janett Billow in 6 months or call earlier if necessary Greater than 50% of time during this 25 minute visit was spent on counseling,explanation of diagnosis of intracranials tenosis, stenting, stroke, planning of further management, discussion with patient and family and coordination of care Antony Contras, MD  [facility] [address] [place], [place] [address]  Phone [phone] Fax [phone] Note: This document was prepared with digital dictation and possible smart phrase technology. Any transcriptional errors that result from this process are unintentional

## 2018-05-22 ENCOUNTER — Ambulatory Visit (HOSPITAL_COMMUNITY): Admission: RE | Admit: 2018-05-22 | Payer: Self-pay | Source: Ambulatory Visit

## 2018-05-28 ENCOUNTER — Ambulatory Visit: Payer: Self-pay | Admitting: Internal Medicine

## 2018-06-06 ENCOUNTER — Ambulatory Visit (HOSPITAL_COMMUNITY)
Admission: RE | Admit: 2018-06-06 | Discharge: 2018-06-06 | Disposition: A | Discharge status: Home / Self Care | Payer: Self-pay | Source: Ambulatory Visit | Attending: Neurology | Admitting: Neurology

## 2018-06-06 DIAGNOSIS — I63311 Cerebral infarction due to thrombosis of right middle cerebral artery: Secondary | ICD-10-CM | POA: Insufficient documentation

## 2018-06-06 NOTE — Progress Notes (Signed)
TCD completed. Farrel Demark, RDMS, RVT

## 2018-06-13 ENCOUNTER — Telehealth: Payer: Self-pay

## 2018-06-13 NOTE — Telephone Encounter (Signed)
Notes recorded by Hildred Alamin, RN on 06/13/2018 at 9:40 AM EDT Rn call the interpreter line to give pt results. The line was busy, will call back later. ------

## 2018-06-13 NOTE — Telephone Encounter (Signed)
-----   Message from Micki Riley, MD sent at 06/08/2018  4:44 PM EDT ----- [name] inform the patient that transcranial Doppler study is limited due to thick skull bone  but the identified blood vessels did not show significant abnormalities

## 2018-06-14 NOTE — Telephone Encounter (Signed)
Notes recorded by Hildred Alamin, RN on 06/14/2018 at 3:36 PM EDT RN call the interpreter line to give pt results of TCD doppler. The line was busy. Rn does not have DPR on file to speak with her niece. ------

## 2018-06-18 NOTE — Telephone Encounter (Signed)
RN called the interpretor line to contact the patient to review the TCD results. I was able to go over the study with the patient through interpretor line and informed her that there was some difficulty with being able to completely view everything but were able to identify there were blood vessels that were normal. Patient verbalized understanding and had no questions.

## 2018-06-18 NOTE — Telephone Encounter (Signed)
Pt and and Elaine(Not on DPR) called to advise results. Consuella Lose helps translate for pt

## 2018-06-25 ENCOUNTER — Other Ambulatory Visit: Payer: Self-pay | Admitting: Physician Assistant

## 2018-06-26 ENCOUNTER — Other Ambulatory Visit: Payer: Self-pay | Admitting: Pharmacist

## 2018-06-26 MED ORDER — TICAGRELOR 90 MG PO TABS: 90.0000 mg | ORAL_TABLET | Freq: Two times a day (BID) | ORAL | 3 refills | Status: DC

## 2018-08-09 ENCOUNTER — Ambulatory Visit: Payer: Self-pay | Attending: Internal Medicine | Admitting: Internal Medicine

## 2018-08-09 ENCOUNTER — Encounter: Payer: Self-pay | Admitting: Internal Medicine

## 2018-08-09 VITALS — BP 118/78 | HR 62 | Temp 98.0°F | Resp 16 | Wt 224.8 lb

## 2018-08-09 DIAGNOSIS — I10 Essential (primary) hypertension: Secondary | ICD-10-CM | POA: Insufficient documentation

## 2018-08-09 DIAGNOSIS — Z1211 Encounter for screening for malignant neoplasm of colon: Secondary | ICD-10-CM | POA: Insufficient documentation

## 2018-08-09 DIAGNOSIS — Z79899 Other long term (current) drug therapy: Secondary | ICD-10-CM | POA: Insufficient documentation

## 2018-08-09 DIAGNOSIS — E119 Type 2 diabetes mellitus without complications: Secondary | ICD-10-CM | POA: Insufficient documentation

## 2018-08-09 DIAGNOSIS — Z87891 Personal history of nicotine dependence: Secondary | ICD-10-CM | POA: Insufficient documentation

## 2018-08-09 DIAGNOSIS — E669 Obesity, unspecified: Secondary | ICD-10-CM | POA: Insufficient documentation

## 2018-08-09 DIAGNOSIS — Z6835 Body mass index (BMI) 35.0-35.9, adult: Secondary | ICD-10-CM | POA: Insufficient documentation

## 2018-08-09 DIAGNOSIS — I69354 Hemiplegia and hemiparesis following cerebral infarction affecting left non-dominant side: Secondary | ICD-10-CM | POA: Insufficient documentation

## 2018-08-09 DIAGNOSIS — Z7984 Long term (current) use of oral hypoglycemic drugs: Secondary | ICD-10-CM | POA: Insufficient documentation

## 2018-08-09 DIAGNOSIS — Z8673 Personal history of transient ischemic attack (TIA), and cerebral infarction without residual deficits: Secondary | ICD-10-CM

## 2018-08-09 DIAGNOSIS — E1169 Type 2 diabetes mellitus with other specified complication: Secondary | ICD-10-CM

## 2018-08-09 DIAGNOSIS — K5900 Constipation, unspecified: Secondary | ICD-10-CM | POA: Insufficient documentation

## 2018-08-09 DIAGNOSIS — Z7982 Long term (current) use of aspirin: Secondary | ICD-10-CM | POA: Insufficient documentation

## 2018-08-09 LAB — GLUCOSE, POCT (MANUAL RESULT ENTRY): POC GLUCOSE: 89 mg/dL (ref 70–99)

## 2018-08-09 MED ORDER — POLYETHYLENE GLYCOL 3350 17 GM/SCOOP PO POWD: 17.0000 g | Freq: Every day | ORAL | 1 refills | Status: DC | PRN

## 2018-08-09 NOTE — Progress Notes (Signed)
Patient ID: Heather [name], female    DOB: 1955/12/23  MRN: [id number]  CC: Diabetes   Subjective: Heather [name] is a 62 y.o. female who presents for chronic ds management Her concerns today include:  Hx of DM, HTN, former smoker tob dep, CVART MCA territory, s/p thrombectomy followed by stenting of M2 by interventional radiology  Hx of CVA:  Saw Dr. Leonie Man since last visit with me.  TCD was okay.  Endorses RT sided HA occasionally for which she takes Tylenol No blurred vision.  Saw Interventional Vascular in June and plan was for f/u again in 6 mths  DM: compliant with Amaryl Checks BS a few times a wk.  Gives range of BS 100-141.  No lows. Doing well with eating habits.  Wgh down 15 lbs since last visit 3 mths ago.  Walks daily in the afternoon Patient Active Problem List   Diagnosis Date Noted  . Depression 02/21/2018  . Essential hypertension 02/21/2018  . Cerebrovascular accident (CVA) due to embolism of right middle cerebral artery ([place]) 02/21/2018  . Diabetes mellitus type 2 in obese ([place]) 12/20/2017  . Right middle cerebral artery stroke ([place]) 12/19/2017  . Left hemiparesis ([place])   . Dysphagia, oropharyngeal   . 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 aspirin 81 MG chewable tablet Chew 1 tablet (81 mg total) by mouth daily.    . Blood Glucose Monitoring Suppl (TRUE METRIX METER) DEVI 1 kit by Does not apply route 4 (four) times daily. 1 Device 0  . glimepiride (AMARYL) 1 MG tablet TAKE 1 TABLET BY MOUTH DAILY WITH BREAKFAST. 30 tablet 3  . glucose blood (TRUE METRIX BLOOD GLUCOSE TEST) test strip Use as instructed 100 each 12  . sertraline (ZOLOFT) 50 MG tablet 1/2 tab PO daily x 2 weeks then 1 tab daily (Patient taking differently: Take by mouth See admin instructions. 1/2 tab PO daily x 2 weeks then 1 tab daily) 30 tablet 3  . ticagrelor (BRILINTA) 90 MG TABS tablet  Take 1 tablet (90 mg total) by mouth 2 (two) times daily. 60 tablet 3  . TRUEPLUS LANCETS 28G MISC 28 g by Does not apply route QID. 120 each 3   No current facility-administered medications on file prior to visit.     No Known Allergies  Social History   Socioeconomic History  . Marital status: Single    Spouse name: Not on file  . Number of children: Not on file  . Years of education: Not on file  . Highest education level: Not on file  Occupational History  . Not on file  Social Needs  . Financial resource strain: Not on file  . Food insecurity:    Worry: Not on file    Inability: Not on file  . Transportation needs:    Medical: Not on file    Non-medical: Not on file  Tobacco Use  . Smoking status: Former Smoker    Packs/day: 0.50    Years: 30.00    Pack years: 15.00    Types: Cigarettes    Last attempt to quit: 12/04/2017    Years since quitting: 0.6  . Smokeless tobacco: Never Used  Substance and Sexual Activity  . Alcohol use: Not Currently    Comment: weekly  . Drug use: No  . Sexual activity: Not Currently    Birth control/protection: None  Lifestyle  .  Physical activity:    Days per week: Not on file    Minutes per session: Not on file  . Stress: Not on file  Relationships  . Social connections:    Talks on phone: Not on file    Gets together: Not on file    Attends religious service: Not on file    Active member of club or organization: Not on file    Attends meetings of clubs or organizations: Not on file    Relationship status: Not on file  . Intimate partner violence:    Fear of current or ex partner: Not on file    Emotionally abused: Not on file    Physically abused: Not on file    Forced sexual activity: Not on file  Other Topics Concern  . Not on file  Social History Narrative  . Not on file    Family History  Problem Relation Age of Onset  . Diabetes Mother   . Pulmonary disease Mother   . Heart disease Father   . Stroke Sister       Past Surgical History:  Procedure Laterality Date  . BREAST EXCISIONAL BIOPSY    . BREAST SURGERY     right breast  . CHOLECYSTECTOMY    . IR ANGIO INTRA EXTRACRAN SEL COM CAROTID INNOMINATE BILAT MOD SED  03/06/2018  . IR ANGIO VERTEBRAL SEL VERTEBRAL BILAT MOD SED  03/06/2018  . IR CT HEAD LTD  12/18/2017  . IR INTRA CRAN STENT  12/18/2017  . IR PERCUTANEOUS ART THROMBECTOMY/INFUSION INTRACRANIAL INC DIAG ANGIO  12/18/2017  . IR RADIOLOGIST EVAL & MGMT  01/31/2018  . RADIOLOGY WITH ANESTHESIA N/A 12/18/2017   Procedure: RADIOLOGY WITH ANESTHESIA;  Surgeon: Luanne Bras, MD;  Location: [facility];  Service: Radiology;  Laterality: N/A;    ROS: Review of Systems GI:  C/o constipation. Stools are hard.  No blood in stools.  PHYSICAL EXAM: BP 118/78   Pulse 62   Temp 98 F (36.7 C) (Oral)   Resp 16   Wt 224 lb 12.8 oz (102 kg)   SpO2 98%   BMI 35.21 kg/m   Wt Readings from Last 3 Encounters:  08/09/18 224 lb 12.8 oz (102 kg)  05/16/18 235 lb 12.8 oz (107 kg)  05/08/18 239 lb 3.2 oz (108.5 kg)    Physical Exam  General appearance - alert, well appearing, and in no distress Mental status - normal mood, behavior, speech, dress, motor activity, and thought processes Chest - clear to auscultation, no wheezes, rales or rhonchi, symmetric air entry Heart - normal rate, regular rhythm, normal S1, S2, no murmurs, rubs, clicks or gallops Extremities - peripheral pulses normal, no pedal edema, no clubbing or cyanosis Diabetic Foot Exam - Simple   Simple Foot Form Visual Inspection Sensation Testing Intact to touch and monofilament testing bilaterally:  Yes Pulse Check Posterior Tibialis and Dorsalis pulse intact bilaterally:  Yes Comments     Depression screen [facility] 2/9 08/09/2018 05/08/2018 02/20/2018  Decreased Interest 0 0 0  Down, Depressed, Hopeless 0 0 0  PHQ - 2 Score 0 0 0  Altered sleeping - - -  Tired, decreased energy - - -  Change in appetite - - -  Feeling bad or  failure about yourself  - - -  Trouble concentrating - - -  Moving slowly or fidgety/restless - - -  Suicidal thoughts - - -  PHQ-9 Score - - -    Results for orders placed  or performed in visit on 08/09/18  POCT glucose (manual entry)  Result Value Ref Range   POC Glucose 89 70 - 99 mg/dl    ASSESSMENT AND PLAN: 1. Diabetes mellitus type 2 in obese ([place]) At goal Commended her on wgh loss.  Encouraged to continue healthy eating habits and regular exercise - POCT glucose (manual entry)  2. Colon cancer screening - Fecal occult blood, imunochemical([facility])  3. History of cardioembolic cerebrovascular accident (CVA) Will send message to Dr. Estanislado Pandy to inquire how long she has to stay on the Brilenta.   4. Constipation, unspecified constipation type - polyethylene glycol powder (GLYCOLAX/MIRALAX) powder; Take 17 g by mouth daily as needed.  Dispense: 3350 g; Refill: 1  5. Obesity (BMI 35.0-39.9 without comorbidity) See #1 above    Patient was given the opportunity to ask questions.  Patient verbalized understanding of the plan and was able to repeat key elements of the plan.  [employer] interpreter used during this encounter. #[id number]  Orders Placed This Encounter  Procedures  . Fecal occult blood, imunochemical([facility])  . POCT glucose (manual entry)     Requested Prescriptions   Signed Prescriptions Disp Refills  . polyethylene glycol powder (GLYCOLAX/MIRALAX) powder 3350 g 1    Sig: Take 17 g by mouth daily as needed.    Return in about 3 months (around 11/09/2018).  Karle Plumber, MD, FACP

## 2018-08-16 ENCOUNTER — Ambulatory Visit (INDEPENDENT_AMBULATORY_CARE_PROVIDER_SITE_OTHER): Payer: Self-pay | Admitting: Adult Health

## 2018-08-16 ENCOUNTER — Encounter: Payer: Self-pay | Admitting: Adult Health

## 2018-08-16 VITALS — BP 99/71 | HR 80 | Ht 67.0 in | Wt 221.4 lb

## 2018-08-16 DIAGNOSIS — E1169 Type 2 diabetes mellitus with other specified complication: Secondary | ICD-10-CM

## 2018-08-16 DIAGNOSIS — I10 Essential (primary) hypertension: Secondary | ICD-10-CM

## 2018-08-16 DIAGNOSIS — I63311 Cerebral infarction due to thrombosis of right middle cerebral artery: Secondary | ICD-10-CM

## 2018-08-16 DIAGNOSIS — E669 Obesity, unspecified: Secondary | ICD-10-CM

## 2018-08-16 NOTE — Patient Instructions (Addendum)
Continue aspirin 81 mg daily and Brilinta for secondary stroke prevention  Continue to follow up with PCP regarding cholesterol and blood pressure management   Neck discomfort - use tylenol as needed for pain along with heat and ice. If this worsens or you experience neurological symptoms, please proceed to ED for evaluation  Good job with the weight loss and keep up the good work!!  Follow up with Dr. Corliss Skains in December for repeat imaging   Continue to monitor blood pressure at home  Maintain strict control of hypertension with blood pressure goal below 130/90, diabetes with hemoglobin A1c goal below 6.5% and cholesterol with LDL cholesterol (bad cholesterol) goal below 70 mg/dL. I also advised the patient to eat a healthy diet with plenty of whole grains, cereals, fruits and vegetables, exercise regularly and maintain ideal body weight.  Followup in the future with me in 6 months or call earlier if needed        Thank you for coming to see [place] at [facility]. I hope we have been able to provide you high quality care today.  You may receive a patient satisfaction survey over the next few weeks. We would appreciate your feedback and comments so that we may continue to improve ourselves and the health of our patients.

## 2018-08-16 NOTE — Progress Notes (Signed)
[facility] [address] [place]. [place] [address] [phone]       OFFICE FOLLOW-UP NOTE  Heather. Heather [name] Date of Birth:  28-Jan-1956 Medical Record Number:  [id number]   HPI: Heather [name] is a 62 year old pleasant Hispanic lady who seen today for first office follow-up visit following hospital admission for stroke in March 2019. She is accompanied by her daughter as well as Spanish language interpreter was present throughout this visit. History is obtained from them as well as review of electronic medical records. I have personally reviewed imaging films.Heather Penais an [name] with PMH of HTN,Obesity who presents [name] emergency room as a stroke alert with left hemiparesis. Patient is Spanish-speaking and initially EMS was told that her symptoms began at 9 PM after she dropped a glass in her left hand. CT head showed no hemorrhage and tPA was mixed. However after nurse spoke to her in [place], patient stated that she had a headache that began around 6:30pm and had left side weakness as she was outside window period upon completion of CT scan.  CTA head and neck was performed which showed a superior division M2 cutoff. After discussing with family given her borderline NIHSS of 7, however worsening symptoms ( from EMS assessment to arrival at hospital) we decided to take the patient for mechanical thrombectomy. Date last known well:3.17.19Time last known well:around 6.30 pm tPA Given:no, outside window at time of CT scan NIHSS:7 ( at time of arrival) Baseline MRS0  . After written informed consent from the patient and family She was taken to the interventional radiology suite for thrombectomy and right middle cerebral artery  Rescue stent placement performed by Dr. Estanislado Pandy for progressive occlusion of the inferior division of the right middle cerebral artery due to underlying stenosis. She also received 10 mg of superselective into arterial  Integrilin. She was given loading dose of 180 mg of Brilinta and aspirin. The patient was initially and in the neurological intensive care unit and closely monitored she did well. MRI scan of the brain showed a moderate-sized right MCA infarct.Marland Kitchen LDL cholesterol 67 mg percent and women A1c was 6.7. Transthoracic echo showed normal ejection fraction. Patient had initial right gaze preference and left hemiplegia but made gradual improvement. She was seen by physical occupational and speech therapy. She was transferred to inpatient rehabilitation and has done well.   05/16/2018 visit: She is currently living at home. She is able to ambulate without assistance. She has only mild diminished fine motor skills in the left hand but otherwise appears to have made a full recovery. She feels she is 85% better. She does complain of decreased stamina and gets tired easily. She is eating healthy and is active and in fact has lost weight. Her fasting sugars have all ranged below 150 and blood pressure is well controlled and it is 106/80 today. She did undergo follow-up cerebral catheter angiogram by Dr. Estanislado Pandy on 03/06/18 which I have personally reviewed showed 50% right middle cerebral artery IntraStent restenosis but good flow distally.  Interval history 08/16/2018: patient is being seen today for follow up and is accompanied by interpreter and daughter. She states she has been doing well. She denies any residual left hand weakness but does endorse pain in left wrist intermittently along with finger joints.  Comes quick and then goes away.  She also endorses right sided intermittent neck pain that has been present since Friday.  She has tried 1 dose of Tylenol which did help but  has not tried again.  She denies any other, neurological deficit and states this neck pain is not debilitating.  Denies any type of trauma or injury.  She did experience this after hospital discharge but subsided and has recently returned.  Denies any  type of headache, migraine, visual loss or dizziness.  Continues on aspirin and brilinta without side effects of bleeding but does endorse easily bruising. Glucose levels have been stable as she does monitor at home. Blood pressure today 99/71. She will be following up with Dr. Estanislado Pandy next month for repeat imaging and length of DAPT will be determined at that time.  She also endorses approximately 50 pound weight loss since her stroke from diet and exercise.  No further concerns at this time.  Denies new or worsening stroke/TIA symptoms.   ROS:   14 system review of systems is positive for no concerns and all other systems negative  PMH:  Past Medical History:  Diagnosis Date  . Hypertension   . Stroke [facility])     Social History:  Social History   Socioeconomic History  . Marital status: Single    Spouse name: Not on file  . Number of children: Not on file  . Years of education: Not on file  . Highest education level: Not on file  Occupational History  . Not on file  Social Needs  . Financial resource strain: Not on file  . Food insecurity:    Worry: Not on file    Inability: Not on file  . Transportation needs:    Medical: Not on file    Non-medical: Not on file  Tobacco Use  . Smoking status: Former Smoker    Packs/day: 0.50    Years: 30.00    Pack years: 15.00    Types: Cigarettes    Last attempt to quit: 12/04/2017    Years since quitting: 0.6  . Smokeless tobacco: Never Used  Substance and Sexual Activity  . Alcohol use: Not Currently    Comment: weekly  . Drug use: No  . Sexual activity: Not Currently    Birth control/protection: None  Lifestyle  . Physical activity:    Days per week: Not on file    Minutes per session: Not on file  . Stress: Not on file  Relationships  . Social connections:    Talks on phone: Not on file    Gets together: Not on file    Attends religious service: Not on file    Active member of club or organization: Not on file     Attends meetings of clubs or organizations: Not on file    Relationship status: Not on file  . Intimate partner violence:    Fear of current or ex partner: Not on file    Emotionally abused: Not on file    Physically abused: Not on file    Forced sexual activity: Not on file  Other Topics Concern  . Not on file  Social History Narrative  . Not on file    Medications:   Current Outpatient Medications on File Prior to Visit  Medication Sig Dispense Refill  . aspirin 81 MG chewable tablet Chew 1 tablet (81 mg total) by mouth daily.    . Blood Glucose Monitoring Suppl (TRUE METRIX METER) DEVI 1 kit by Does not apply route 4 (four) times daily. 1 Device 0  . glimepiride (AMARYL) 1 MG tablet TAKE 1 TABLET BY MOUTH DAILY WITH BREAKFAST. 30 tablet 3  . glucose  blood (TRUE METRIX BLOOD GLUCOSE TEST) test strip Use as instructed 100 each 12  . polyethylene glycol powder (GLYCOLAX/MIRALAX) powder Take 17 g by mouth daily as needed. 3350 g 1  . sertraline (ZOLOFT) 50 MG tablet 1/2 tab PO daily x 2 weeks then 1 tab daily (Patient taking differently: Take by mouth See admin instructions. 1/2 tab PO daily x 2 weeks then 1 tab daily) 30 tablet 3  . ticagrelor (BRILINTA) 90 MG TABS tablet Take 1 tablet (90 mg total) by mouth 2 (two) times daily. 60 tablet 3  . TRUEPLUS LANCETS 28G MISC 28 g by Does not apply route QID. 120 each 3   No current facility-administered medications on file prior to visit.     Allergies:  No Known Allergies  Physical Exam General: well developed, well nourished pleasant middle-age  Hispanic Spanish-speaking lady, seated, in no evident distress Head: head normocephalic and atraumatic.  Neck: supple with no carotid or supraclavicular bruits Cardiovascular: regular rate and rhythm, no murmurs Musculoskeletal: no deformity Skin:  no rash/petichiae Vascular:  Normal pulses all extremities Vitals:   08/16/18 0958  BP: 99/71  Pulse: 80   Neurologic Exam Mental Status:  Awake and fully alert. Oriented to place and time. Recent and remote memory intact. Attention span, concentration and fund of knowledge appropriate. Mood and affect appropriate.  Cranial Nerves: Fundoscopic exam reveals sharp disc margins. Pupils equal, briskly reactive to light. Extraocular movements full without nystagmus. Visual fields full to confrontation. Hearing intact. Facial sensation intact. Face, tongue, palate moves normally and symmetrically.  Motor: Normal bulk and tone. Normal strength in all tested extremity muscles.  Mild diminished fine finger movements on the left. Orbits right over left upper extremity. Sensory.: intact to touch ,pinprick .position and vibratory sensation.  Coordination: Rapid alternating movements normal in all extremities. Finger-to-nose and heel-to-shin performed accurately bilaterally. Gait and Station: Arises from chair without difficulty. Stance is normal. Gait demonstrates normal stride length and balance . Able to heel, toe and tandem walk without difficulty.  Reflexes: 1+ and symmetric. Toes downgoing.      ASSESSMENT: 62 year old Hispanic lady with right MCA infarct in March 2019 secondary to right MCA stenosis treated with acute angioplasty and stenting with excellent clinical recovery. Vascular risk factors of intracranial stenosis, diabetes, hypertension and hyperlipidemia     PLAN: 1. Right MCA infarct: Continue aspirin 81 mg daily and Brilinta for secondary stroke prevention. Maintain strict control of hypertension with blood pressure goal below 130/90, diabetes with hemoglobin A1c goal below 6.5% and cholesterol with LDL cholesterol (bad cholesterol) goal below 70 mg/dL.  I also advised the patient to eat a healthy diet with plenty of whole grains, cereals, fruits and vegetables, exercise regularly with at least 30 minutes of continuous activity daily and maintain ideal body weight.  Congratulated patient on weight loss and encouraged  continuation 2. HTN: Advised to continue current treatment regimen.  Today's BP 99/71.  Advised to continue to monitor at home along with continued follow-up with PCP for management 3. HLD: Advised to continue current treatment regimen along with continued follow-up with PCP for future prescribing and monitoring of lipid panel 4. DMII: Advised to continue to monitor glucose levels at home along with continued follow-up with PCP for management and monitoring 5. Intracranial stenosis s/p stent: Continue DAPT with aspirin and Brilinta.  She will follow-up with Dr. Estanislado Pandy next month for repeat imaging along with discussion regarding DAPT length as patient was questioning this during appointment 6. Neck discomfort:  Advised patient take Tylenol as needed for pain along with the use of heat and ice.  If this continues or worsens to notify PCP.  If she starts to experience any neurological deficit including severe headache, visual changes, dizziness or any other strokelike symptoms, to proceed to ED for further evaluation   Follow up in 6 months or call earlier if needed with questions, concerns or need a sooner follow-up appointment    Greater than 50% of time during this 25 minute visit was spent on counseling,explanation of diagnosis of intracranials tenosis, stenting, stroke, planning of further management, discussion with patient and family and coordination of care  Venancio Poisson, AGNP-BC  [facility] [address] [place], [place] [address]  Phone [phone] Fax [phone] Note: This document was prepared with digital dictation and possible smart phrase technology. Any transcriptional errors that result from this process are unintentional.

## 2018-08-18 NOTE — Progress Notes (Signed)
I agree with the above plan 

## 2018-10-12 ENCOUNTER — Encounter: Payer: Self-pay | Admitting: Internal Medicine

## 2018-10-12 ENCOUNTER — Ambulatory Visit: Payer: Self-pay | Attending: Internal Medicine | Admitting: Internal Medicine

## 2018-10-12 ENCOUNTER — Ambulatory Visit: Payer: Self-pay

## 2018-10-12 VITALS — BP 94/68 | HR 92 | Temp 98.5°F | Resp 16 | Ht 66.0 in | Wt 217.2 lb

## 2018-10-12 DIAGNOSIS — I10 Essential (primary) hypertension: Secondary | ICD-10-CM | POA: Insufficient documentation

## 2018-10-12 DIAGNOSIS — E1169 Type 2 diabetes mellitus with other specified complication: Secondary | ICD-10-CM

## 2018-10-12 DIAGNOSIS — E162 Hypoglycemia, unspecified: Secondary | ICD-10-CM

## 2018-10-12 DIAGNOSIS — Z7984 Long term (current) use of oral hypoglycemic drugs: Secondary | ICD-10-CM | POA: Insufficient documentation

## 2018-10-12 DIAGNOSIS — F329 Major depressive disorder, single episode, unspecified: Secondary | ICD-10-CM | POA: Insufficient documentation

## 2018-10-12 DIAGNOSIS — E669 Obesity, unspecified: Secondary | ICD-10-CM | POA: Insufficient documentation

## 2018-10-12 DIAGNOSIS — Z79899 Other long term (current) drug therapy: Secondary | ICD-10-CM | POA: Insufficient documentation

## 2018-10-12 DIAGNOSIS — E11649 Type 2 diabetes mellitus with hypoglycemia without coma: Secondary | ICD-10-CM | POA: Insufficient documentation

## 2018-10-12 DIAGNOSIS — Z87891 Personal history of nicotine dependence: Secondary | ICD-10-CM | POA: Insufficient documentation

## 2018-10-12 DIAGNOSIS — Z8673 Personal history of transient ischemic attack (TIA), and cerebral infarction without residual deficits: Secondary | ICD-10-CM

## 2018-10-12 DIAGNOSIS — I63411 Cerebral infarction due to embolism of right middle cerebral artery: Secondary | ICD-10-CM

## 2018-10-12 DIAGNOSIS — Z6835 Body mass index (BMI) 35.0-35.9, adult: Secondary | ICD-10-CM | POA: Insufficient documentation

## 2018-10-12 DIAGNOSIS — I69354 Hemiplegia and hemiparesis following cerebral infarction affecting left non-dominant side: Secondary | ICD-10-CM | POA: Insufficient documentation

## 2018-10-12 DIAGNOSIS — Z23 Encounter for immunization: Secondary | ICD-10-CM | POA: Insufficient documentation

## 2018-10-12 DIAGNOSIS — Z7982 Long term (current) use of aspirin: Secondary | ICD-10-CM | POA: Insufficient documentation

## 2018-10-12 DIAGNOSIS — I669 Occlusion and stenosis of unspecified cerebral artery: Secondary | ICD-10-CM | POA: Insufficient documentation

## 2018-10-12 LAB — GLUCOSE, POCT (MANUAL RESULT ENTRY): POC Glucose: 106 mg/dL — AB (ref 70–99)

## 2018-10-12 NOTE — Patient Instructions (Signed)
Influenza Virus Vaccine injection (Fluarix)  What is this medicine?  INFLUENZA VIRUS VACCINE (in floo EN zuh VAHY ruhs vak SEEN) helps to reduce the risk of getting influenza also known as the flu.  This medicine may be used for other purposes; ask your health care provider or pharmacist if you have questions.  COMMON BRAND NAME(S): Fluarix, Fluzone  What should I tell my health care provider before I take this medicine?  They need to know if you have any of these conditions:  -bleeding disorder like hemophilia  -fever or infection  -Guillain-Barre syndrome or other neurological problems  -immune system problems  -infection with the human immunodeficiency virus (HIV) or AIDS  -low blood platelet counts  -multiple sclerosis  -an unusual or allergic reaction to influenza virus vaccine, eggs, chicken proteins, latex, gentamicin, other medicines, foods, dyes or preservatives  -pregnant or trying to get pregnant  -breast-feeding  How should I use this medicine?  This vaccine is for injection into a muscle. It is given by a health care professional.  A copy of Vaccine Information Statements will be given before each vaccination. Read this sheet carefully each time. The sheet may change frequently.  Talk to your pediatrician regarding the use of this medicine in children. Special care may be needed.  Overdosage: If you think you have taken too much of this medicine contact a poison control center or emergency room at once.  NOTE: This medicine is only for you. Do not share this medicine with others.  What if I miss a dose?  This does not apply.  What may interact with this medicine?  -chemotherapy or radiation therapy  -medicines that lower your immune system like etanercept, anakinra, infliximab, and adalimumab  -medicines that treat or prevent blood clots like warfarin  -phenytoin  -steroid medicines like prednisone or cortisone  -theophylline  -vaccines  This list may not describe all possible interactions. Give your  health care provider a list of all the medicines, herbs, non-prescription drugs, or dietary supplements you use. Also tell them if you smoke, drink alcohol, or use illegal drugs. Some items may interact with your medicine.  What should I watch for while using this medicine?  Report any side effects that do not go away within 3 days to your doctor or health care professional. Call your health care provider if any unusual symptoms occur within 6 weeks of receiving this vaccine.  You may still catch the flu, but the illness is not usually as bad. You cannot get the flu from the vaccine. The vaccine will not protect against colds or other illnesses that may cause fever. The vaccine is needed every year.  What side effects may I notice from receiving this medicine?  Side effects that you should report to your doctor or health care professional as soon as possible:  -allergic reactions like skin rash, itching or hives, swelling of the face, lips, or tongue  Side effects that usually do not require medical attention (report to your doctor or health care professional if they continue or are bothersome):  -fever  -headache  -muscle aches and pains  -pain, tenderness, redness, or swelling at site where injected  -weak or tired  This list may not describe all possible side effects. Call your doctor for medical advice about side effects. You may report side effects to FDA at 1-800-FDA-1088.  Where should I keep my medicine?  This vaccine is only given in a clinic, pharmacy, doctor's office, or other health care   setting and will not be stored at home.  NOTE: This sheet is a summary. It may not cover all possible information. If you have questions about this medicine, talk to your doctor, pharmacist, or health care provider.   2019 Elsevier/Gold Standard (2008-04-16 09:30:40)

## 2018-10-12 NOTE — Progress Notes (Signed)
Patient ID: Heather [name], female    DOB: [birth year]  MRN: [id number]  CC: Diabetes   Subjective: Heather [name] is a 63 y.o. female who presents for UC visit Her concerns today include:  Hx of DM, HTN, former smoker tob dep, CVART MCA territory, s/p thrombectomy followed by stenting of M2 by interventional radiology  Saw neurologist Dr. Leonie Man 08/2018. Advised to continue DAPT with Brilinta and aspirin.  Reports easy bruising in her upper extremities if she bumps into anything.  Was due for repeat MRA and follow-up with Dr. Estanislado Pandy in 09/2018 but her [facility] discount/orange card is expired.  She came today to meet with the financial counselor and was able to have it renewed.  She would like for me to set up a follow-up appointment with Dr. Estanislado Pandy   Checks BS daily for past 2 wks. range 100-120.   Had one reading in the 45s which scared her.  She is not had any further lows.  She is on Amaryl.  Patient Active Problem List   Diagnosis Date Noted  . Depression 02/21/2018  . Essential hypertension 02/21/2018  . Cerebrovascular accident (CVA) due to embolism of right middle cerebral artery ([place]) 02/21/2018  . Diabetes mellitus type 2 in obese ([place]) 12/20/2017  . Right middle cerebral artery stroke ([place]) 12/19/2017  . Left hemiparesis ([place])   . Dysphagia, oropharyngeal   . 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 aspirin 81 MG chewable tablet Chew 1 tablet (81 mg total) by mouth daily.    . Blood Glucose Monitoring Suppl (TRUE METRIX METER) DEVI 1 kit by Does not apply route 4 (four) times daily. 1 Device 0  . glimepiride (AMARYL) 1 MG tablet TAKE 1 TABLET BY MOUTH DAILY WITH BREAKFAST. 30 tablet 3  . glucose blood (TRUE METRIX BLOOD GLUCOSE TEST) test strip Use as instructed 100 each 12  . polyethylene glycol powder (GLYCOLAX/MIRALAX) powder Take 17 g by mouth daily as needed. 3350 g  1  . sertraline (ZOLOFT) 50 MG tablet 1/2 tab PO daily x 2 weeks then 1 tab daily (Patient taking differently: Take by mouth See admin instructions. 1/2 tab PO daily x 2 weeks then 1 tab daily) 30 tablet 3  . ticagrelor (BRILINTA) 90 MG TABS tablet Take 1 tablet (90 mg total) by mouth 2 (two) times daily. 60 tablet 3  . TRUEPLUS LANCETS 28G MISC 28 g by Does not apply route QID. 120 each 3   No current facility-administered medications on file prior to visit.     No Known Allergies  Social History   Socioeconomic History  . Marital status: Single    Spouse name: Not on file  . Number of children: Not on file  . Years of education: Not on file  . Highest education level: Not on file  Occupational History  . Not on file  Social Needs  . Financial resource strain: Not on file  . Food insecurity:    Worry: Not on file    Inability: Not on file  . Transportation needs:    Medical: Not on file    Non-medical: Not on file  Tobacco Use  . Smoking status: Former Smoker    Packs/day: 0.50    Years: 30.00    Pack years: 15.00    Types: Cigarettes    Last attempt to quit: 12/04/2017    Years since quitting:  0.8  . Smokeless tobacco: Never Used  Substance and Sexual Activity  . Alcohol use: Not Currently    Comment: weekly  . Drug use: No  . Sexual activity: Not Currently    Birth control/protection: None  Lifestyle  . Physical activity:    Days per week: Not on file    Minutes per session: Not on file  . Stress: Not on file  Relationships  . Social connections:    Talks on phone: Not on file    Gets together: Not on file    Attends religious service: Not on file    Active member of club or organization: Not on file    Attends meetings of clubs or organizations: Not on file    Relationship status: Not on file  . Intimate partner violence:    Fear of current or ex partner: Not on file    Emotionally abused: Not on file    Physically abused: Not on file    Forced sexual  activity: Not on file  Other Topics Concern  . Not on file  Social History Narrative  . Not on file    Family History  Problem Relation Age of Onset  . Diabetes Mother   . Pulmonary disease Mother   . Heart disease Father   . Stroke Sister     Past Surgical History:  Procedure Laterality Date  . BREAST EXCISIONAL BIOPSY    . BREAST SURGERY     right breast  . CHOLECYSTECTOMY    . IR ANGIO INTRA EXTRACRAN SEL COM CAROTID INNOMINATE BILAT MOD SED  03/06/2018  . IR ANGIO VERTEBRAL SEL VERTEBRAL BILAT MOD SED  03/06/2018  . IR CT HEAD LTD  12/18/2017  . IR INTRA CRAN STENT  12/18/2017  . IR PERCUTANEOUS ART THROMBECTOMY/INFUSION INTRACRANIAL INC DIAG ANGIO  12/18/2017  . IR RADIOLOGIST EVAL & MGMT  01/31/2018  . RADIOLOGY WITH ANESTHESIA N/A 12/18/2017   Procedure: RADIOLOGY WITH ANESTHESIA;  Surgeon: Luanne Bras, MD;  Location: [facility];  Service: Radiology;  Laterality: N/A;    ROS: Review of Systems Negative except as above. PHYSICAL EXAM: BP 94/68   Pulse 92   Temp 98.5 F (36.9 C) (Oral)   Resp 16   Ht '5\' 6"'  (1.676 m)   Wt 217 lb 3.2 oz (98.5 kg)   SpO2 98%   BMI 35.06 kg/m   BP 102/70 Physical Exam  General appearance - alert, well appearing, and in no distress Mental status - normal mood, behavior, speech, dress, motor activity, and thought processes Neck - supple, no significant adenopathy Chest - clear to auscultation, no wheezes, rales or rhonchi, symmetric air entry Heart - normal rate, regular rhythm, normal S1, S2, no murmurs, rubs, clicks or gallops Extremities - peripheral pulses normal, no pedal edema, no clubbing or cyanosis  Results for orders placed or performed in visit on 10/12/18  POCT glucose (manual entry)  Result Value Ref Range   POC Glucose 106 (A) 70 - 99 mg/dl   Lab Results  Component Value Date   HGBA1C 5.5 05/08/2018     ASSESSMENT AND PLAN: 1. Diabetes mellitus type 2 in obese (HCC) Continue healthy eating habits and low-dose  Amaryl.  Advised to stop Amaryl if blood sugars repeatedly less than 80 - POCT glucose (manual entry) - CBC - Basic Metabolic Panel  2. Hypoglycemia See #1 above  3. Cerebrovascular accident (CVA) due to embolism of right middle cerebral artery ([place]) 4. Stenosis of intracranial vessel We  will refer to Dr. Rosann Auerbach  5. Need for immunization against influenza - Flu Vaccine QUAD 36+ mos IM     Patient was given the opportunity to ask questions.  Patient verbalized understanding of the plan and was able to repeat key elements of the plan.   Orders Placed This Encounter  Procedures  . Flu Vaccine QUAD 36+ mos IM  . POCT glucose (manual entry)     Requested Prescriptions    No prescriptions requested or ordered in this encounter    No follow-ups on file.  Karle Plumber, MD, FACP

## 2018-10-13 LAB — BASIC METABOLIC PANEL
BUN / CREAT RATIO: 27 (ref 12–28)
BUN: 16 mg/dL (ref 8–27)
CHLORIDE: 105 mmol/L (ref 96–106)
CO2: 18 mmol/L — ABNORMAL LOW (ref 20–29)
Calcium: 9.9 mg/dL (ref 8.7–10.3)
Creatinine, Ser: 0.59 mg/dL (ref 0.57–1.00)
GFR calc Af Amer: 113 mL/min/{1.73_m2} (ref >59)
GFR calc non Af Amer: 98 mL/min/{1.73_m2} (ref >59)
GLUCOSE: 107 mg/dL — AB (ref 65–99)
Potassium: 4.6 mmol/L (ref 3.5–5.2)
SODIUM: 141 mmol/L (ref 134–144)

## 2018-10-13 LAB — CBC
HEMOGLOBIN: 14.1 g/dL (ref 11.1–15.9)
Hematocrit: 43 % (ref 34.0–46.6)
MCH: 29.9 pg (ref 26.6–33.0)
MCHC: 32.8 g/dL (ref 31.5–35.7)
MCV: 91 fL (ref 79–97)
PLATELETS: 305 10*3/uL (ref 150–450)
RBC: 4.72 x10E6/uL (ref 3.77–5.28)
RDW: 13.2 % (ref 11.7–15.4)
WBC: 10.4 10*3/uL (ref 3.4–10.8)

## 2018-10-20 ENCOUNTER — Other Ambulatory Visit: Payer: Self-pay | Admitting: Internal Medicine

## 2018-10-20 NOTE — Progress Notes (Signed)
Opened in error

## 2018-10-22 ENCOUNTER — Other Ambulatory Visit: Payer: Self-pay | Admitting: Internal Medicine

## 2018-10-22 DIAGNOSIS — F329 Major depressive disorder, single episode, unspecified: Secondary | ICD-10-CM

## 2018-10-22 DIAGNOSIS — F32A Depression, unspecified: Secondary | ICD-10-CM

## 2018-10-25 ENCOUNTER — Other Ambulatory Visit (HOSPITAL_COMMUNITY): Payer: Self-pay | Admitting: Interventional Radiology

## 2018-10-25 DIAGNOSIS — I639 Cerebral infarction, unspecified: Secondary | ICD-10-CM

## 2018-10-26 ENCOUNTER — Telehealth: Payer: Self-pay

## 2018-10-26 NOTE — Telephone Encounter (Signed)
[employer] Desma Maxim  Id# [phone]  contacted pt to go over lab results pt didn't answer left a detailed vm informing pt of results and if she has any questions or concerns to give me a call

## 2018-11-01 ENCOUNTER — Ambulatory Visit (HOSPITAL_COMMUNITY)
Admission: RE | Admit: 2018-11-01 | Discharge: 2018-11-01 | Disposition: A | Discharge status: Home / Self Care | Payer: Self-pay | Source: Ambulatory Visit | Attending: Interventional Radiology | Admitting: Interventional Radiology

## 2018-11-01 ENCOUNTER — Ambulatory Visit (HOSPITAL_COMMUNITY): Payer: Self-pay

## 2018-11-01 DIAGNOSIS — I639 Cerebral infarction, unspecified: Secondary | ICD-10-CM | POA: Insufficient documentation

## 2018-11-05 ENCOUNTER — Other Ambulatory Visit (HOSPITAL_COMMUNITY): Payer: Self-pay | Admitting: Interventional Radiology

## 2018-11-05 DIAGNOSIS — I639 Cerebral infarction, unspecified: Secondary | ICD-10-CM

## 2018-11-18 ENCOUNTER — Emergency Department (HOSPITAL_COMMUNITY): Payer: Self-pay

## 2018-11-18 ENCOUNTER — Encounter (HOSPITAL_COMMUNITY): Payer: Self-pay | Admitting: Emergency Medicine

## 2018-11-18 ENCOUNTER — Observation Stay (HOSPITAL_COMMUNITY): Payer: Self-pay

## 2018-11-18 ENCOUNTER — Observation Stay (HOSPITAL_COMMUNITY)
Admission: EM | Admit: 2018-11-18 | Discharge: 2018-11-20 | Disposition: A | Discharge status: Home / Self Care | Payer: Self-pay | Attending: Internal Medicine | Admitting: Internal Medicine

## 2018-11-18 DIAGNOSIS — I639 Cerebral infarction, unspecified: Secondary | ICD-10-CM

## 2018-11-18 DIAGNOSIS — E1169 Type 2 diabetes mellitus with other specified complication: Secondary | ICD-10-CM | POA: Diagnosis present

## 2018-11-18 DIAGNOSIS — G459 Transient cerebral ischemic attack, unspecified: Principal | ICD-10-CM

## 2018-11-18 DIAGNOSIS — G8194 Hemiplegia, unspecified affecting left nondominant side: Secondary | ICD-10-CM | POA: Diagnosis present

## 2018-11-18 DIAGNOSIS — Z8673 Personal history of transient ischemic attack (TIA), and cerebral infarction without residual deficits: Secondary | ICD-10-CM | POA: Insufficient documentation

## 2018-11-18 DIAGNOSIS — Z79899 Other long term (current) drug therapy: Secondary | ICD-10-CM | POA: Insufficient documentation

## 2018-11-18 DIAGNOSIS — Z87891 Personal history of nicotine dependence: Secondary | ICD-10-CM | POA: Insufficient documentation

## 2018-11-18 DIAGNOSIS — E119 Type 2 diabetes mellitus without complications: Secondary | ICD-10-CM | POA: Diagnosis present

## 2018-11-18 DIAGNOSIS — G8190 Hemiplegia, unspecified affecting unspecified side: Secondary | ICD-10-CM | POA: Insufficient documentation

## 2018-11-18 DIAGNOSIS — I10 Essential (primary) hypertension: Secondary | ICD-10-CM | POA: Diagnosis present

## 2018-11-18 DIAGNOSIS — Z7982 Long term (current) use of aspirin: Secondary | ICD-10-CM | POA: Insufficient documentation

## 2018-11-18 DIAGNOSIS — R471 Dysarthria and anarthria: Secondary | ICD-10-CM

## 2018-11-18 DIAGNOSIS — E669 Obesity, unspecified: Secondary | ICD-10-CM

## 2018-11-18 LAB — COMPREHENSIVE METABOLIC PANEL
ALT: 24 U/L (ref 0–44)
AST: 21 U/L (ref 15–41)
Albumin: 4.1 g/dL (ref 3.5–5.0)
Alkaline Phosphatase: 68 U/L (ref 38–126)
Anion gap: 4 — ABNORMAL LOW (ref 5–15)
BUN: 12 mg/dL (ref 8–23)
CHLORIDE: 110 mmol/L (ref 98–111)
CO2: 27 mmol/L (ref 22–32)
CREATININE: 0.56 mg/dL (ref 0.44–1.00)
Calcium: 9.6 mg/dL (ref 8.9–10.3)
GFR calc Af Amer: >60 mL/min (ref >60)
GFR calc non Af Amer: >60 mL/min (ref >60)
Glucose, Bld: 104 mg/dL — ABNORMAL HIGH (ref 70–99)
Potassium: 4.2 mmol/L (ref 3.5–5.1)
Sodium: 141 mmol/L (ref 135–145)
Total Bilirubin: 0.7 mg/dL (ref 0.3–1.2)
Total Protein: 7.4 g/dL (ref 6.5–8.1)

## 2018-11-18 LAB — CBC
HCT: 48 % — ABNORMAL HIGH (ref 36.0–46.0)
Hemoglobin: 15 g/dL (ref 12.0–15.0)
MCH: 29 pg (ref 26.0–34.0)
MCHC: 31.3 g/dL (ref 30.0–36.0)
MCV: 92.7 fL (ref 80.0–100.0)
Platelets: 262 10*3/uL (ref 150–400)
RBC: 5.18 MIL/uL — ABNORMAL HIGH (ref 3.87–5.11)
RDW: 13.4 % (ref 11.5–15.5)
WBC: 8.8 10*3/uL (ref 4.0–10.5)
nRBC: 0 % (ref 0.0–0.2)

## 2018-11-18 LAB — RAPID URINE DRUG SCREEN, HOSP PERFORMED
Amphetamines: NOT DETECTED
Barbiturates: NOT DETECTED
Benzodiazepines: NOT DETECTED
Cocaine: NOT DETECTED
Opiates: NOT DETECTED
Tetrahydrocannabinol: NOT DETECTED

## 2018-11-18 LAB — DIFFERENTIAL
Abs Immature Granulocytes: 0.02 10*3/uL (ref 0.00–0.07)
Basophils Absolute: 0 10*3/uL (ref 0.0–0.1)
Basophils Relative: 1 %
Eosinophils Absolute: 0.1 10*3/uL (ref 0.0–0.5)
Eosinophils Relative: 1 %
Immature Granulocytes: 0 %
Lymphocytes Relative: 24 %
Lymphs Abs: 2.1 10*3/uL (ref 0.7–4.0)
Monocytes Absolute: 0.4 10*3/uL (ref 0.1–1.0)
Monocytes Relative: 5 %
Neutro Abs: 6.1 10*3/uL (ref 1.7–7.7)
Neutrophils Relative %: 69 %

## 2018-11-18 LAB — GLUCOSE, CAPILLARY
Glucose-Capillary: 75 mg/dL (ref 70–99)
Glucose-Capillary: 129 mg/dL — ABNORMAL HIGH (ref 70–99)

## 2018-11-18 LAB — URINALYSIS, ROUTINE W REFLEX MICROSCOPIC
Bilirubin Urine: NEGATIVE
Glucose, UA: NEGATIVE mg/dL
Hgb urine dipstick: NEGATIVE
KETONES UR: NEGATIVE mg/dL
Leukocytes,Ua: NEGATIVE
Nitrite: NEGATIVE
Protein, ur: NEGATIVE mg/dL
Specific Gravity, Urine: 1.006 (ref 1.005–1.030)
pH: 8 (ref 5.0–8.0)

## 2018-11-18 LAB — ETHANOL: Alcohol, Ethyl (B): <10 mg/dL (ref <10)

## 2018-11-18 LAB — APTT: aPTT: 28 seconds (ref 24–36)

## 2018-11-18 LAB — PROTIME-INR
INR: 0.92
Prothrombin Time: 12.3 seconds (ref 11.4–15.2)

## 2018-11-18 MED ORDER — SENNOSIDES-DOCUSATE SODIUM 8.6-50 MG PO TABS: 1.0000 | ORAL_TABLET | Freq: Every evening | ORAL | Status: DC | PRN

## 2018-11-18 MED ORDER — IOPAMIDOL (ISOVUE-370) INJECTION 76%
INTRAVENOUS | Status: AC
  Administered 2018-11-18: 50 mL
  Filled 2018-11-18: qty 100

## 2018-11-18 MED ORDER — ACETAMINOPHEN 160 MG/5ML PO SOLN: 650.0000 mg | ORAL | Status: DC | PRN

## 2018-11-18 MED ORDER — INSULIN ASPART 100 UNIT/ML ~~LOC~~ SOLN: 0.0000 [IU] | Freq: Three times a day (TID) | SUBCUTANEOUS | Status: DC

## 2018-11-18 MED ORDER — ACETAMINOPHEN 325 MG PO TABS
650.0000 mg | ORAL_TABLET | ORAL | Status: DC | PRN
  Administered 2018-11-20: 650 mg via ORAL
  Filled 2018-11-18 (×2): qty 2

## 2018-11-18 MED ORDER — ACETAMINOPHEN 650 MG RE SUPP: 650.0000 mg | RECTAL | Status: DC | PRN

## 2018-11-18 MED ORDER — SERTRALINE HCL 50 MG PO TABS
50.0000 mg | ORAL_TABLET | Freq: Every day | ORAL | Status: DC
  Administered 2018-11-19: 50 mg via ORAL
  Filled 2018-11-18 (×2): qty 1

## 2018-11-18 MED ORDER — ASPIRIN 81 MG PO CHEW
81.0000 mg | CHEWABLE_TABLET | Freq: Every day | ORAL | Status: DC
  Administered 2018-11-19 – 2018-11-20 (×2): 81 mg via ORAL
  Filled 2018-11-18 (×2): qty 1

## 2018-11-18 MED ORDER — ENOXAPARIN SODIUM 40 MG/0.4ML ~~LOC~~ SOLN
40.0000 mg | SUBCUTANEOUS | Status: DC
  Administered 2018-11-18 – 2018-11-19 (×2): 40 mg via SUBCUTANEOUS
  Filled 2018-11-18 (×2): qty 0.4

## 2018-11-18 MED ORDER — TICAGRELOR 90 MG PO TABS
90.0000 mg | ORAL_TABLET | Freq: Two times a day (BID) | ORAL | Status: DC
  Administered 2018-11-18 – 2018-11-19 (×3): 90 mg via ORAL
  Filled 2018-11-18 (×4): qty 1

## 2018-11-18 MED ORDER — INSULIN DETEMIR 100 UNIT/ML ~~LOC~~ SOLN
5.0000 [IU] | Freq: Two times a day (BID) | SUBCUTANEOUS | Status: DC
  Administered 2018-11-18 – 2018-11-20 (×4): 5 [IU] via SUBCUTANEOUS
  Filled 2018-11-18 (×5): qty 0.05

## 2018-11-18 MED ORDER — DIAZEPAM 5 MG/ML IJ SOLN
2.5000 mg | Freq: Once | INTRAMUSCULAR | Status: AC
  Administered 2018-11-18: 2.5 mg via INTRAVENOUS
  Filled 2018-11-18: qty 2

## 2018-11-18 MED ORDER — LORAZEPAM 1 MG PO TABS
1.0000 mg | ORAL_TABLET | Freq: Four times a day (QID) | ORAL | Status: DC | PRN
  Administered 2018-11-18: 1 mg via ORAL
  Filled 2018-11-18: qty 1

## 2018-11-18 MED ORDER — INSULIN ASPART 100 UNIT/ML ~~LOC~~ SOLN
3.0000 [IU] | Freq: Three times a day (TID) | SUBCUTANEOUS | Status: DC
  Administered 2018-11-19: 3 [IU] via SUBCUTANEOUS

## 2018-11-18 MED ORDER — SODIUM CHLORIDE 0.9 % IV SOLN: INTRAVENOUS | Status: DC

## 2018-11-18 MED ORDER — INSULIN ASPART 100 UNIT/ML ~~LOC~~ SOLN: 0.0000 [IU] | Freq: Every day | SUBCUTANEOUS | Status: DC

## 2018-11-18 MED ORDER — STROKE: EARLY STAGES OF RECOVERY BOOK
Freq: Once | Status: DC
  Filled 2018-11-18: qty 1

## 2018-11-18 MED ORDER — POLYETHYLENE GLYCOL 3350 17 GM/SCOOP PO POWD
17.0000 g | Freq: Every day | ORAL | Status: DC | PRN
  Filled 2018-11-18: qty 255

## 2018-11-18 MED ORDER — ATORVASTATIN CALCIUM 80 MG PO TABS
80.0000 mg | ORAL_TABLET | Freq: Every day | ORAL | Status: DC
  Administered 2018-11-18: 80 mg via ORAL
  Filled 2018-11-18: qty 1

## 2018-11-18 NOTE — ED Notes (Signed)
Called to give report to floor. Told bed needs to be approved before report can be taken

## 2018-11-18 NOTE — Consult Note (Addendum)
NEURO HOSPITALIST  CONSULT   Requesting Physician: Dr. Billy Fischer    Chief Complaint: facial droop  History obtained from:  Patient/ family   HPI:                                                                                                                                         Heather [name] is an 63 y.o. female  With PMH stroke (s/p thrombectomy 3/19), HTN presented to [facility] ED with c/o facial droop.   Patient spanish speaking-only.  Per patient she woke up this morning and began to feel like her face was drawing up. she states that there was some right facial twitching as well as right head twitching. This lasted about 10 minutes. She states that she remembers the event and could communicate the entire time. Family said that she lost about 3 hours of time. No LOC, denies any falls, HA, SOB, CP, weakness. Denies smoking or ETOH use. Stopped smoking 1 year ago. Patient on ASA 81 mg and Brilinta d/ inctracranial stenosis stenting.   ED course:  BP: 120/104  CTH and CTA ordered.   12/18/17: presented with left hemiparesis and gaze preference; right M2 occlusion. No residual deficits.    Date last known well: 11/17/2018 Time last known well: 2300 tPA Given: No: outside of window  Modified Rankin: Rankin Score=0 NIHSS:0   Past Medical History:  Diagnosis Date  . Hypertension   . Stroke [facility])     Past Surgical History:  Procedure Laterality Date  . BREAST EXCISIONAL BIOPSY    . BREAST SURGERY     right breast  . CHOLECYSTECTOMY    . IR ANGIO INTRA EXTRACRAN SEL COM CAROTID INNOMINATE BILAT MOD SED  03/06/2018  . IR ANGIO VERTEBRAL SEL VERTEBRAL BILAT MOD SED  03/06/2018  . IR CT HEAD LTD  12/18/2017  . IR INTRA CRAN STENT  12/18/2017  . IR PERCUTANEOUS ART THROMBECTOMY/INFUSION INTRACRANIAL INC DIAG ANGIO  12/18/2017  . IR RADIOLOGIST EVAL & MGMT  01/31/2018  . RADIOLOGY WITH ANESTHESIA N/A 12/18/2017   Procedure: RADIOLOGY WITH  ANESTHESIA;  Surgeon: Luanne Bras, MD;  Location: [facility];  Service: Radiology;  Laterality: N/A;    Family History  Problem Relation Age of Onset  . Diabetes Mother   . Pulmonary disease Mother   . Heart disease Father   . Stroke Sister        Social History:  reports that she quit smoking about a year ago. Her smoking use included cigarettes. She has a 15.00 pack-year smoking history. She has never used smokeless tobacco. She reports previous alcohol use. She reports that  she does not use drugs.  Allergies: No Known Allergies  Medications:                                                                                                                           No current facility-administered medications for this encounter.    Current Outpatient Medications  Medication Sig Dispense Refill  . acetaminophen (TYLENOL) 500 MG tablet Take 1,000 mg by mouth every 8 (eight) hours as needed for moderate pain or headache.    Marland Kitchen aspirin 81 MG chewable tablet Chew 1 tablet (81 mg total) by mouth daily.    . Blood Glucose Monitoring Suppl (TRUE METRIX METER) DEVI 1 kit by Does not apply route 4 (four) times daily. 1 Device 0  . glimepiride (AMARYL) 1 MG tablet TAKE 1 TABLET BY MOUTH DAILY WITH BREAKFAST. 30 tablet 2  . glucose blood (TRUE METRIX BLOOD GLUCOSE TEST) test strip Use as instructed 100 each 12  . polyethylene glycol powder (GLYCOLAX/MIRALAX) powder Take 17 g by mouth daily as needed. (Patient taking differently: Take 17 g by mouth daily as needed for moderate constipation. ) 3350 g 1  . sertraline (ZOLOFT) 50 MG tablet Take 1 tablet (50 mg total) by mouth daily. (Patient not taking: Reported on 11/13/2018) 30 tablet 2  . ticagrelor (BRILINTA) 90 MG TABS tablet Take 1 tablet (90 mg total) by mouth 2 (two) times daily. 60 tablet 3  . TRUEPLUS LANCETS 28G MISC 28 g by Does not apply route QID. 120 each 3     ROS:                                                                                                                                        ROS was performed and is negative except as noted in HPI    General Examination:  Blood pressure (!) 120/104, pulse 70, temperature 98.3 F (36.8 C), temperature source Oral, resp. rate 12, height '5\' 5"'  (1.651 m), weight 98.5 kg, SpO2 96 %.  HEENT-  Normocephalic, no lesions, without obvious abnormality.  Normal external eye and conjunctiva. Cardiovascular- S1-S2 audible, pulses palpable throughout  Lungs-no rhonchi or wheezing noted, no excessive working breathing.  Saturations within normal limits on RA Abdomen- All 4 quadrants palpated and non tender Extremities- Warm, dry and intact Musculoskeletal-no joint tenderness, deformity or swelling Skin-warm and dry, no hyperpigmentation, vitiligo, or suspicious lesions  Neurological Examination Mental Status: Alert, oriented, thought content appropriate.  Speech fluent without evidence of aphasia.  No dysarthria noted.  Able to follow commands without difficulty. Cranial Nerves: II:  Visual fields grossly normal,  III,IV, VI: ptosis not present, extra-ocular motions intact bilaterally, pupils equal, round, reactive to light and accommodation V,VII: smile symmetric, facial light touch sensation normal bilaterally VIII: hearing normal bilaterally IX,X: uvula rises midline XI: bilateral shoulder shrug XII: midline tongue extension Motor: Right : Upper extremity   5/5  Left:     Upper extremity   5/5  Lower extremity   5/5   Lower extremity   5/5 Tone and bulk:normal tone throughout; no atrophy noted Sensory:  light touch intact throughout, bilaterally Deep Tendon Reflexes: 2+ and symmetric biceps and patella Plantars: Right: downgoing   Left: downgoing Cerebellar: normal finger-to-nose,  and normal heel-to-shin test Gait: deferred   Lab Results: reviewed  UA: no UTI UDS:  negative  Imaging: No results found.     Laurey Morale, MSN, NP-C [facility] [phone]  11/18/2018, 1:35 PM   Attending physician note to follow with Assessment and plan .   Assessment: 63 y.o. female With PMH stroke (s/p thrombectomy and R M1 intracranial stent on  3/19), HTN presented to [facility] ED with c/o facial droop. TPA not given d/t outside of window. EEG to r/o seizure activity. CTA shows 50 % intrastent stenosis.   Stroke Risk Factors - diabetes mellitus   Recommendations --HgbA1c, fasting lipid panel -MRI head ( with ativan per patient request) -CTA head and neck --PT consult, OT consult, Speech consult --Echocardiogram --statin if LDL> 70 --continue ASA and brilinta --Telemetry monitoring --Frequent neuro checks --NPO until passes stroke swallow screen -- EEG   --please page stroke NP  Or  PA  Or MD from 8am -4 pm  as this patient from this time will be  followed by the stroke.   You can look them up on [email or web address]  Password [email or web address]   NEUROHOSPITALIST ADDENDUM Performed a face to face diagnostic evaluation.   I have reviewed the contents of history and physical exam as documented by PA/ARNP/Resident and agree with above documentation.  I have discussed and formulated the above plan as documented. Edits to the note have been made as needed.  63 year old female with a h/o of  right M1 occlusion status post thrombectomy and intracranial stenting in 12/2017 presents with left facial droop that is completely resolved.  Patient is back to her baseline and has no weakness in either extremity.  Patient is compliant on aspirin and Brilinta.  We recommended MRI brain to look for acute stroke as well as CT angiogram to look for patency of intracranial stent.  CT angiogram of the head showed 50% intra stent stenosis.  Recommend discussion with Dr. Ceasar Lund  tomorrow regarding IntraStent stenosis.  Stroke team to follow    Karena Addison Lilias Lorensen MD [facility] [phone]   If 7pm to  7am, please call on call as listed on AMION.

## 2018-11-18 NOTE — ED Provider Notes (Addendum)
[facility] EMERGENCY DEPARTMENT Provider Note   CSN: [id number] Arrival date & time: 11/18/18  1212     History   Chief Complaint Chief Complaint  Patient presents with  . Transient Ischemic Attack    HPI Heather [name] is a 63 y.o. 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  History is obtained directly from patient in [place] without interpreter, family at bedside.  Patient states she was talking to her grandson sitting on a chair when she felt her speech was slurred.  She felt bilateral sides of her face "drawing up" and twitching.  Family who was with her told her that her left face was drooping.  The symptoms began suddenly around 845 or 9 AM and lasted approximately 10 to 15 minutes.  Daughter at bedside states that patient has been acting "odd" the last couple of days.  For example, patient told her daughter that she had fed her grandchildren but daughter noticed that the food was not cooked yet.  Patient states she has been urinating more frequently but contributes this to increased oral fluid intake.  She denies associated dysuria, hematuria, urgency, abdominal pain or flank pain.  She is followed by [facility] neurology and was recently seen in the clinic.  Daughter states that neurology told her that there was an area that was appearing to be more occluded and she has a schedule of CT angiography study this week.  Currently patient has no headache, vision changes, difficulty with speech, facial drooping, difficulty with balance, one-sided loss of sensation or strength.  No recent head injury.  No preceding illness.  HPI  Past Medical History:  Diagnosis Date  . Hypertension   . Stroke [facility])     Patient Active Problem List   Diagnosis Date Noted  . Depression 02/21/2018  . Essential hypertension 02/21/2018  .  Cerebrovascular accident (CVA) due to embolism of right middle cerebral artery ([place]) 02/21/2018  . Diabetes mellitus type 2 in obese ([place]) 12/20/2017  . 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 Left hemiparesis ([place])   . Dysphagia, oropharyngeal   . Middle cerebral artery embolism, 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    Past Surgical History:  Procedure Laterality Date  . BREAST EXCISIONAL BIOPSY    . BREAST SURGERY     right breast  . CHOLECYSTECTOMY    . IR ANGIO INTRA EXTRACRAN SEL COM CAROTID INNOMINATE BILAT MOD SED  03/06/2018  . IR ANGIO VERTEBRAL SEL VERTEBRAL BILAT MOD SED  03/06/2018  . IR CT HEAD LTD  12/18/2017  . IR INTRA CRAN STENT  12/18/2017  . IR PERCUTANEOUS ART THROMBECTOMY/INFUSION INTRACRANIAL INC DIAG ANGIO  12/18/2017  . IR RADIOLOGIST EVAL & MGMT  01/31/2018  . RADIOLOGY WITH ANESTHESIA N/A 12/18/2017   Procedure: RADIOLOGY WITH ANESTHESIA;  Surgeon: Luanne Bras, MD;  Location: [facility];  Service: Radiology;  Laterality: N/A;     OB History    Gravida  1   Para      Term      Preterm      AB      Living  1     SAB      TAB      Ectopic      Multiple      Live Births  1            Home  Medications    Prior to Admission medications   Medication Sig Start Date End Date Taking? Authorizing Provider  acetaminophen (TYLENOL) 500 MG tablet Take 1,000 mg by mouth every 8 (eight) hours as needed for moderate pain or headache.    [provider]  aspirin 81 MG chewable tablet Chew 1 tablet (81 mg total) by mouth daily. 12/27/17   Angiulli, Lavon Paganini, PA-C  Blood Glucose Monitoring Suppl (TRUE METRIX METER) DEVI 1 kit by Does not apply route 4 (four) times daily. 01/12/18   Brayton Caves, PA-C  glimepiride (AMARYL) 1 MG tablet TAKE 1 TABLET BY MOUTH DAILY WITH BREAKFAST. 10/23/18   Ladell Pier, MD  glucose blood (TRUE METRIX BLOOD GLUCOSE TEST) test strip Use as instructed 01/12/18   Brayton Caves, PA-C  polyethylene glycol powder  (GLYCOLAX/MIRALAX) powder Take 17 g by mouth daily as needed. Patient taking differently: Take 17 g by mouth daily as needed for moderate constipation.  08/09/18   Ladell Pier, MD  sertraline (ZOLOFT) 50 MG tablet Take 1 tablet (50 mg total) by mouth daily. Patient not taking: Reported on 11/13/2018 10/23/18   Ladell Pier, MD  ticagrelor (BRILINTA) 90 MG TABS tablet Take 1 tablet (90 mg total) by mouth 2 (two) times daily. 06/26/18   Ladell Pier, MD  TRUEPLUS LANCETS 28G MISC 28 g by Does not apply route QID. 01/12/18   Brayton Caves, PA-C    Family History Family History  Problem Relation Age of Onset  . Diabetes Mother   . Pulmonary disease Mother   . Heart disease Father   . Stroke Sister     Social History Social History   Tobacco Use  . Smoking status: Former Smoker    Packs/day: 0.50    Years: 30.00    Pack years: 15.00    Types: Cigarettes    Last attempt to quit: 12/04/2017    Years since quitting: 0.9  . Smokeless tobacco: Never Used  Substance Use Topics  . Alcohol use: Not Currently    Comment: weekly  . Drug use: No     Allergies   Patient has no known allergies.   Review of Systems Review of Systems  Genitourinary: Positive for frequency.  Neurological: Positive for speech difficulty and weakness (left face).  Hematological: Bruises/bleeds easily.  All other systems reviewed and are negative.    Physical Exam Updated Vital Signs BP 114/81   Pulse 73   Temp 98.3 F (36.8 C) (Oral)   Resp 14   Ht 5' 5" (1.651 m)   Wt 98.5 kg   SpO2 98%   BMI 36.14 kg/m   Physical Exam Vitals signs and nursing note reviewed.  Constitutional:      General: She is not in acute distress.    Appearance: She is well-developed.     Comments: NAD.  HENT:     Head: Normocephalic and atraumatic.     Right Ear: External ear normal.     Left Ear: External ear normal.     Nose: Nose normal.  Eyes:     General: No scleral icterus.     Conjunctiva/sclera: Conjunctivae normal.  Neck:     Musculoskeletal: Normal range of motion and neck supple.  Cardiovascular:     Rate and Rhythm: Normal rate and regular rhythm.     Heart sounds: Normal heart sounds. No murmur.  Pulmonary:     Effort: Pulmonary effort is normal.     Breath  sounds: Normal breath sounds. No wheezing.  Musculoskeletal: Normal range of motion.        General: No deformity.  Skin:    General: Skin is warm and dry.     Capillary Refill: Capillary refill takes less than 2 seconds.  Neurological:     Mental Status: She is alert and oriented to person, place, and time.     Comments: Alert and oriented to self, place, time and event.  Speech is fluent without dysarthria or dysphasia. Strength 5/5 with hand grip and ankle F/E.   Sensation to light touch intact in face, hands and feet. Normal gait/sits on side of the bed without truncal sway No pronator drift. No leg drop. Normal finger-to-nose and feet tapping.  CN I not tested CN II grossly intact visual fields bilaterally. Unable to visualize posterior eye. CN III, IV, VI PEERL and EOMs intact bilaterally CN V light touch intact in all 3 divisions of trigeminal nerve CN VII facial movements symmetric CN VIII not tested CN IX, X no uvula deviation, symmetric rise of soft palate  CN XI 5/5 SCM and trapezius strength bilaterally  CN XII Midline tongue protrusion, symmetric L/R movements  Psychiatric:        Behavior: Behavior normal.        Thought Content: Thought content normal.        Judgment: Judgment normal.      ED Treatments / Results  Labs (all labs ordered are listed, but only abnormal results are displayed) Labs Reviewed  CBC - Abnormal; Notable for the following components:      Result Value   RBC 5.18 (*)    HCT 48.0 (*)    All other components within normal limits  COMPREHENSIVE METABOLIC PANEL - Abnormal; Notable for the following components:   Glucose, Bld 104 (*)    Anion gap  4 (*)    All other components within normal limits  URINALYSIS, ROUTINE W REFLEX MICROSCOPIC - Abnormal; Notable for the following components:   Color, Urine STRAW (*)    All other components within normal limits  ETHANOL  PROTIME-INR  APTT  DIFFERENTIAL  RAPID URINE DRUG SCREEN, HOSP PERFORMED    EKG EKG Interpretation  Date/Time:  Sunday November 18 2018 12:15:58 EST Ventricular Rate:  77 PR Interval:    QRS Duration: 114 QT Interval:  391 QTC Calculation: 443 R Axis:   -45 Text Interpretation:  Sinus rhythm Paired ventricular premature complexes Incomplete RBBB and LAFB No significant change since last tracing Confirmed by Gareth Morgan [phone]) on 11/18/2018 12:59:01 PM Also confirmed by Gareth Morgan [phone]), editor Radene Gunning [phone])  on 11/18/2018 3:16:02 PM   Radiology Ct Angio Head W Or Wo Contrast  Result Date: 11/18/2018 CLINICAL DATA:  Right facial droop. History of right MCA infarct and stenting. EXAM: CT ANGIOGRAPHY HEAD AND NECK TECHNIQUE: Multidetector CT imaging of the head and neck was performed using the standard protocol during bolus administration of intravenous contrast. Multiplanar CT image reconstructions and MIPs were obtained to evaluate the vascular anatomy. Carotid stenosis measurements (when applicable) are obtained utilizing NASCET criteria, using the distal internal carotid diameter as the denominator. CONTRAST:  41m ISOVUE-370 IOPAMIDOL (ISOVUE-370) INJECTION 76% COMPARISON:  Head MRI/MRA 11/01/2018.  Head and neck CTA 12/17/2017. FINDINGS: CT HEAD FINDINGS Brain: There is no evidence of acute infarct, intracranial hemorrhage, mass, midline shift, or extra-axial fluid collection. A large, chronic right MCA infarct is again noted. Hypodensities elsewhere in the cerebral white matter bilaterally are  nonspecific but compatible with mild chronic small vessel ischemic disease. The ventricles are normal in size aside from minimal ex vacuo dilatation of the  right lateral ventricle. Vascular: As below. Skull: No fracture or focal osseous lesion. Sinuses: Minimal mucosal thickening in the maxillary sinuses. Clear mastoid air cells. Orbits: Unremarkable. Review of the MIP images confirms the above findings CTA NECK FINDINGS Aortic arch: Standard 3 vessel aortic arch with nonstenotic plaque at the left subclavian artery origin. Right carotid system: Patent without evidence of stenosis or dissection. Tortuous cervical ICA. Left carotid system: Patent without evidence of stenosis or dissection. Vertebral arteries: Patent without evidence of stenosis or dissection. Moderately to strongly dominant left vertebral artery. Skeleton: Mild cervical spondylosis. Other neck: No evidence of acute abnormality or mass. Upper chest: Clear lung apices. Review of the MIP images confirms the above findings CTA HEAD FINDINGS Anterior circulation: The internal carotid arteries are widely patent from skull base to carotid termini. A right MCA stent extending from the M1 to M2 segments is patent with a proximally 50% stenosis in its proximal to midportion. The left MCA and both ACAs are patent with mild-to-moderate branch vessel irregular narrowing but no evidence of proximal branch occlusion or significant A1 or M1 stenosis. The left A1 segment is extremely hypoplastic. No aneurysm is identified. Posterior circulation: The intracranial vertebral arteries are widely patent to the basilar. Patent left PICA, bilateral AICA, and bilateral SCA origins are identified. The basilar artery is widely patent. There are small right and medium-sized left posterior communicating arteries. The PCAs are patent, however there are severe mid to distal left P2 stenoses which are new from the 2019 CTA and progressive from the recent MRA. No aneurysm is identified. Venous sinuses: Patent. Anatomic variants: Hypoplastic left A1. Delayed phase: No abnormal enhancement. Review of the MIP images confirms the above  findings IMPRESSION: 1. Chronic right MCA infarct. No definite acute infarct or hemorrhage. 2. 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 3. Patent right MCA stent with 50% intra-stent stenosis. 4. Widely patent cervical carotid and vertebral arteries. Electronically Signed   By: Logan Bores M.D.   On: 11/18/2018 16:06   Ct Angio Neck W Or Wo Contrast  Result Date: 11/18/2018 CLINICAL DATA:  Right facial droop. History of right MCA infarct and stenting. EXAM: CT ANGIOGRAPHY HEAD AND NECK TECHNIQUE: Multidetector CT imaging of the head and neck was performed using the standard protocol during bolus administration of intravenous contrast. Multiplanar CT image reconstructions and MIPs were obtained to evaluate the vascular anatomy. Carotid stenosis measurements (when applicable) are obtained utilizing NASCET criteria, using the distal internal carotid diameter as the denominator. CONTRAST:  70m ISOVUE-370 IOPAMIDOL (ISOVUE-370) INJECTION 76% COMPARISON:  Head MRI/MRA 11/01/2018.  Head and neck CTA 12/17/2017. FINDINGS: CT HEAD FINDINGS Brain: There is no evidence of acute infarct, intracranial hemorrhage, mass, midline shift, or extra-axial fluid collection. A large, chronic right MCA infarct is again noted. Hypodensities elsewhere in the cerebral white matter bilaterally are nonspecific but compatible with mild chronic small vessel ischemic disease. The ventricles are normal in size aside from minimal ex vacuo dilatation of the right lateral ventricle. Vascular: As below. Skull: No fracture or focal osseous lesion. Sinuses: Minimal mucosal thickening in the maxillary sinuses. Clear mastoid air cells. Orbits: Unremarkable. Review of the MIP images confirms the above findings CTA NECK FINDINGS Aortic arch: Standard 3 vessel aortic arch with nonstenotic plaque at the left subclavian artery origin. Right carotid system: Patent without evidence of stenosis  or dissection. Tortuous cervical ICA. Left carotid  system: Patent without evidence of stenosis or dissection. Vertebral arteries: Patent without evidence of stenosis or dissection. Moderately to strongly dominant left vertebral artery. Skeleton: Mild cervical spondylosis. Other neck: No evidence of acute abnormality or mass. Upper chest: Clear lung apices. Review of the MIP images confirms the above findings CTA HEAD FINDINGS Anterior circulation: The internal carotid arteries are widely patent from skull base to carotid termini. A right MCA stent extending from the M1 to M2 segments is patent with a proximally 50% stenosis in its proximal to midportion. The left MCA and both ACAs are patent with mild-to-moderate branch vessel irregular narrowing but no evidence of proximal branch occlusion or significant A1 or M1 stenosis. The left A1 segment is extremely hypoplastic. No aneurysm is identified. Posterior circulation: The intracranial vertebral arteries are widely patent to the basilar. Patent left PICA, bilateral AICA, and bilateral SCA origins are identified. The basilar artery is widely patent. There are small right and medium-sized left posterior communicating arteries. The PCAs are patent, however there are severe mid to distal left P2 stenoses which are new from the 2019 CTA and progressive from the recent MRA. No aneurysm is identified. Venous sinuses: Patent. Anatomic variants: Hypoplastic left A1. Delayed phase: No abnormal enhancement. Review of the MIP images confirms the above findings IMPRESSION: 1. Chronic right MCA infarct. No definite acute infarct or hemorrhage. 2. 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 3. Patent right MCA stent with 50% intra-stent stenosis. 4. Widely patent cervical carotid and vertebral arteries. Electronically Signed   By: Logan Bores M.D.   On: 11/18/2018 16:06    Procedures Ultrasound ED Peripheral IV (Provider) Date/Time: 11/18/2018 2:37 PM Performed by: Kinnie Feil, PA-C Authorized by: Kinnie Feil, PA-C   Procedure details:    Indications: multiple failed IV attempts and poor IV access     Skin Prep: chlorhexidine gluconate     Location:  Left AC   Angiocath:  20 G   Bedside Ultrasound Guided: Yes     Images: not archived     Patient tolerated procedure without complications: Yes     Dressing applied: Yes     (including critical care time)  Medications Ordered in ED Medications  iopamidol (ISOVUE-370) 76 % injection (50 mLs  Contrast Given 11/18/18 1500)     Initial Impression / Assessment and Plan / ED Course  I have reviewed the triage vital signs and the nursing notes.  Pertinent labs & imaging results that were available during my care of the patient were reviewed by me and considered in my medical decision making (see chart for details).  Clinical Course as of Nov 18 1629  Sun Nov 18, 2018  1611 IMPRESSION: 1. Chronic right MCA infarct. No definite acute infarct or hemorrhage. 2. 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 3. Patent right MCA stent with 50% intra-stent stenosis. 4. Widely patent cervical carotid and vertebral arteries.  CT ANGIO HEAD W OR WO CONTRAST [CG]    Clinical Course User Index [CG] Kinnie Feil, PA-C   63 yo with recent R MCA s/p thrombectomy, stent on Brillinta and aspirin presents with transient dysarthria and left facial drooping.  Occurred at 0845-9 am, reportedly witnessed by grandson/family.  Concern for TIA vs worsening occlusion.  Asymptomatic upon arrival. Negative LVO scale.  Of note, daughter states pt has been acting somewhat odd, forgetful.  Pt reports urinary frequency but no other infectious symptoms.  No head trauma.  Will get UA to r/o this process.   1508: I spoke with Dr.Aroor who recommends CT angiography, will consider MRI as needed.  He recommends medicine admission for TIA work-up.  Neurology NP has evaluated patient.  Delay in labs due to difficult IV access.  Labs unremarkable.  UA without  signs of infection.  Patient is awaiting CT angiography.  Will be handed off to oncoming team will follow-up on imaging and place consult to medicine team for TIA work-up.  Neurology will follow.  It looks like they have placed EEG order as well. Patient is updated on the plan, handoff and admission. Final Clinical Impressions(s) / ED Diagnoses   Final diagnoses:  Dysarthria    ED Discharge Orders    None       Kinnie Feil, PA-C 11/18/18 1633    Gareth Morgan, MD 11/20/18 1109

## 2018-11-18 NOTE — ED Notes (Signed)
Report given to Ala RN

## 2018-11-18 NOTE — ED Notes (Addendum)
Pt ambulated to restroom with steady gait.

## 2018-11-18 NOTE — H&P (Signed)
History and Physical  Heather [name] XMD:[id number] DOB: [birth year] DOA: 11/18/2018  PCP: Ladell Pier, MD Patient coming from: Home  I have personally briefly reviewed patient's old medical records in [place]   Chief Complaint: facial weakness  HPI: Heather [name] is a 63 y.o. 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  She relates it all started with twitching of the left face, she denies any weakness in her arms or her legs, no scotomas, denies any fever, nausea vomiting or diarrhea.  In the ED: Blood pressure is stable, basic metabolic panel is unrevealing, as well as CBC, CTA shows chronic right MCA infarct no acute hemorrhage, and the patient right MCA stent is 50% stenosed, with some progressive severe distal stenosis   Review of Systems: All systems reviewed and apart from history of presenting illness, are negative.  Past Medical History:  Diagnosis Date  . Hypertension   . Stroke [facility])    Past Surgical History:  Procedure Laterality Date  . BREAST EXCISIONAL BIOPSY    . BREAST SURGERY     right breast  . CHOLECYSTECTOMY    . IR ANGIO INTRA EXTRACRAN SEL COM CAROTID INNOMINATE BILAT MOD SED  03/06/2018  . IR ANGIO VERTEBRAL SEL VERTEBRAL BILAT MOD SED  03/06/2018  . IR CT HEAD LTD  12/18/2017  . IR INTRA CRAN STENT  12/18/2017  . IR PERCUTANEOUS ART THROMBECTOMY/INFUSION INTRACRANIAL INC DIAG ANGIO  12/18/2017  . IR RADIOLOGIST EVAL & MGMT  01/31/2018  . RADIOLOGY WITH ANESTHESIA N/A 12/18/2017   Procedure: RADIOLOGY WITH ANESTHESIA;  Surgeon: Luanne Bras, MD;   Location: [place];  Service: Radiology;  Laterality: N/A;   Social History:  reports that she quit smoking about a year ago. Her smoking use included cigarettes. She has a 15.00 pack-year smoking history. She has never used smokeless tobacco. She reports previous alcohol use. She reports that she does not use drugs.   No Known Allergies  Family History  Problem Relation Age of Onset  . Diabetes Mother   . Pulmonary disease Mother   . Heart disease Father   . Stroke Sister     Prior to Admission medications   Medication Sig Start Date End Date Taking? Authorizing Provider  acetaminophen (TYLENOL) 500 MG tablet Take 1,000 mg by mouth every 8 (eight) hours as needed for moderate pain or headache.    [provider]  aspirin 81 MG chewable tablet Chew 1 tablet (81 mg total) by mouth daily. 12/27/17   Angiulli, Lavon Paganini, PA-C  Blood Glucose Monitoring Suppl (TRUE METRIX METER) DEVI 1 kit by Does not apply route 4 (four) times daily. 01/12/18   Brayton Caves, PA-C  glimepiride (AMARYL) 1 MG tablet TAKE 1 TABLET BY MOUTH DAILY WITH BREAKFAST. 10/23/18   Ladell Pier, MD  glucose blood (TRUE METRIX BLOOD GLUCOSE TEST) test strip Use as instructed 01/12/18   Brayton Caves, PA-C  polyethylene glycol powder (GLYCOLAX/MIRALAX) powder Take 17 g by mouth daily as needed. Patient taking differently: Take 17 g by mouth daily as needed for moderate  constipation.  08/09/18   Ladell Pier, MD  sertraline (ZOLOFT) 50 MG tablet Take 1 tablet (50 mg total) by mouth daily. Patient not taking: Reported on 11/13/2018 10/23/18   Ladell Pier, MD  ticagrelor (BRILINTA) 90 MG TABS tablet Take 1 tablet (90 mg total) by mouth 2 (two) times daily. 06/26/18   Ladell Pier, MD  TRUEPLUS LANCETS 28G MISC 28 g by Does not apply route QID. 01/12/18   Brayton Caves, [place]   Physical Exam: Vitals:   11/18/18 1530 11/18/18 1545 11/18/18 1600 11/18/18 1615  BP: 121/79 (!) 116/96 121/82 114/81    Pulse: 81 (!) 41 73 73  Resp: _0 Temp:      TempSrc:      SpO2: 94% 95% 100% 98%  Weight:      Height:         General exam: Moderately built and nourished patient, lying comfortably supine on the gurney in no obvious distress.  Head, eyes and ENT: Nontraumatic and normocephalic. Pupils equally reacting to light and accommodation. Oral mucosa moist.  Neck: Supple. No JVD, carotid bruit or thyromegaly.  Lymphatics: No lymphadenopathy.  Respiratory system: Clear to auscultation. No increased work of breathing.  Cardiovascular system: S1 and S2 heard, RRR. No JVD, murmurs, gallops, clicks or pedal edema.  Gastrointestinal system: Abdomen is nondistended, soft and nontender. Normal bowel sounds heard. No organomegaly or masses appreciated.  Central nervous system: She is alert awake and oriented x3, her speech is fluent without any evidence of aphasia no dysarthria. 3-12 are grossly intact sensation is intact throughout muscle strength is 5/5 in all 4 extremities and deep tendon reflexes 2+ symmetrical bilaterally.  Extremities: No lower extremity edema.  Skin: No rashes or acute findings.  Musculoskeletal system: Negative exam.  Psychiatry: Pleasant and cooperative.   Labs on Admission:  Basic Metabolic Panel: Recent Labs  Lab 11/18/18 1422  NA 141  K 4.2  CL 110  CO2 27  GLUCOSE 104*  BUN 12  CREATININE 0.56  CALCIUM 9.6   Liver Function Tests: Recent Labs  Lab 11/18/18 1422  AST 21  ALT 24  ALKPHOS 68  BILITOT 0.7  PROT 7.4  ALBUMIN 4.1   No results for input(s): LIPASE, AMYLASE in the last 168 hours. No results for input(s): AMMONIA in the last 168 hours. CBC: Recent Labs  Lab 11/18/18 1422  WBC 8.8  NEUTROABS 6.1  HGB 15.0  HCT 48.0*  MCV 92.7  PLT 262   Cardiac Enzymes: No results for input(s): CKTOTAL, CKMB, CKMBINDEX, TROPONINI in the last 168 hours.  BNP (last 3 results) No results for input(s): PROBNP in the last 8760  hours. CBG: No results for input(s): GLUCAP in the last 168 hours.  Radiological Exams on Admission: Ct Angio Head W Or Wo Contrast  Result Date: 11/18/2018 CLINICAL DATA:  Right facial droop. History of right MCA infarct and stenting. EXAM: CT ANGIOGRAPHY HEAD AND NECK TECHNIQUE: Multidetector CT imaging of the head and neck was performed using the standard protocol during bolus administration of intravenous contrast. Multiplanar CT image reconstructions and MIPs were obtained to evaluate the vascular anatomy. Carotid stenosis measurements (when applicable) are obtained utilizing NASCET criteria, using the distal internal carotid diameter as the denominator. CONTRAST:  6m ISOVUE-370 IOPAMIDOL (ISOVUE-370) INJECTION 76% COMPARISON:  Head MRI/MRA 11/01/2018.  Head and neck CTA 12/17/2017. FINDINGS: CT HEAD FINDINGS Brain: There is no evidence of acute infarct, intracranial hemorrhage, mass, midline shift, or  extra-axial fluid collection. A large, chronic right MCA infarct is again noted. Hypodensities elsewhere in the cerebral white matter bilaterally are nonspecific but compatible with mild chronic small vessel ischemic disease. The ventricles are normal in size aside from minimal ex vacuo dilatation of the right lateral ventricle. Vascular: As below. Skull: No fracture or focal osseous lesion. Sinuses: Minimal mucosal thickening in the maxillary sinuses. Clear mastoid air cells. Orbits: Unremarkable. Review of the MIP images confirms the above findings CTA NECK FINDINGS Aortic arch: Standard 3 vessel aortic arch with nonstenotic plaque at the left subclavian artery origin. Right carotid system: Patent without evidence of stenosis or dissection. Tortuous cervical ICA. Left carotid system: Patent without evidence of stenosis or dissection. Vertebral arteries: Patent without evidence of stenosis or dissection. Moderately to strongly dominant left vertebral artery. Skeleton: Mild cervical spondylosis. Other  neck: No evidence of acute abnormality or mass. Upper chest: Clear lung apices. Review of the MIP images confirms the above findings CTA HEAD FINDINGS Anterior circulation: The internal carotid arteries are widely patent from skull base to carotid termini. A right MCA stent extending from the M1 to M2 segments is patent with a proximally 50% stenosis in its proximal to midportion. The left MCA and both ACAs are patent with mild-to-moderate branch vessel irregular narrowing but no evidence of proximal branch occlusion or significant A1 or M1 stenosis. The left A1 segment is extremely hypoplastic. No aneurysm is identified. Posterior circulation: The intracranial vertebral arteries are widely patent to the basilar. Patent left PICA, bilateral AICA, and bilateral SCA origins are identified. The basilar artery is widely patent. There are small right and medium-sized left posterior communicating arteries. The PCAs are patent, however there are severe mid to distal left P2 stenoses which are new from the 2019 CTA and progressive from the recent MRA. No aneurysm is identified. Venous sinuses: Patent. Anatomic variants: Hypoplastic left A1. Delayed phase: No abnormal enhancement. Review of the MIP images confirms the above findings IMPRESSION: 1. Chronic right MCA infarct. No definite acute infarct or hemorrhage. 2. 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 3. Patent right MCA stent with 50% intra-stent stenosis. 4. Widely patent cervical carotid and vertebral arteries. Electronically Signed   By: Logan Bores M.D.   On: 11/18/2018 16:06   Ct Angio Neck W Or Wo Contrast  Result Date: 11/18/2018 CLINICAL DATA:  Right facial droop. History of right MCA infarct and stenting. EXAM: CT ANGIOGRAPHY HEAD AND NECK TECHNIQUE: Multidetector CT imaging of the head and neck was performed using the standard protocol during bolus administration of intravenous contrast. Multiplanar CT image reconstructions and MIPs were  obtained to evaluate the vascular anatomy. Carotid stenosis measurements (when applicable) are obtained utilizing NASCET criteria, using the distal internal carotid diameter as the denominator. CONTRAST:  57m ISOVUE-370 IOPAMIDOL (ISOVUE-370) INJECTION 76% COMPARISON:  Head MRI/MRA 11/01/2018.  Head and neck CTA 12/17/2017. FINDINGS: CT HEAD FINDINGS Brain: There is no evidence of acute infarct, intracranial hemorrhage, mass, midline shift, or extra-axial fluid collection. A large, chronic right MCA infarct is again noted. Hypodensities elsewhere in the cerebral white matter bilaterally are nonspecific but compatible with mild chronic small vessel ischemic disease. The ventricles are normal in size aside from minimal ex vacuo dilatation of the right lateral ventricle. Vascular: As below. Skull: No fracture or focal osseous lesion. Sinuses: Minimal mucosal thickening in the maxillary sinuses. Clear mastoid air cells. Orbits: Unremarkable. Review of the MIP images confirms the above findings CTA NECK FINDINGS Aortic arch: Standard  3 vessel aortic arch with nonstenotic plaque at the left subclavian artery origin. Right carotid system: Patent without evidence of stenosis or dissection. Tortuous cervical ICA. Left carotid system: Patent without evidence of stenosis or dissection. Vertebral arteries: Patent without evidence of stenosis or dissection. Moderately to strongly dominant left vertebral artery. Skeleton: Mild cervical spondylosis. Other neck: No evidence of acute abnormality or mass. Upper chest: Clear lung apices. Review of the MIP images confirms the above findings CTA HEAD FINDINGS Anterior circulation: The internal carotid arteries are widely patent from skull base to carotid termini. A right MCA stent extending from the M1 to M2 segments is patent with a proximally 50% stenosis in its proximal to midportion. The left MCA and both ACAs are patent with mild-to-moderate branch vessel irregular narrowing but  no evidence of proximal branch occlusion or significant A1 or M1 stenosis. The left A1 segment is extremely hypoplastic. No aneurysm is identified. Posterior circulation: The intracranial vertebral arteries are widely patent to the basilar. Patent left PICA, bilateral AICA, and bilateral SCA origins are identified. The basilar artery is widely patent. There are small right and medium-sized left posterior communicating arteries. The PCAs are patent, however there are severe mid to distal left P2 stenoses which are new from the 2019 CTA and progressive from the recent MRA. No aneurysm is identified. Venous sinuses: Patent. Anatomic variants: Hypoplastic left A1. Delayed phase: No abnormal enhancement. Review of the MIP images confirms the above findings IMPRESSION: 1. Chronic right MCA infarct. No definite acute infarct or hemorrhage. 2. 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 3. Patent right MCA stent with 50% intra-stent stenosis. 4. Widely patent cervical carotid and vertebral arteries. Electronically Signed   By: Logan Bores M.D.   On: 11/18/2018 16:06    EKG: Independently reviewed.  Normal sinus rhythm right bundle branch block with a left axis deviation nonspecific 2 changes.  Assessment/Plan Possible TIA (transient ischemic attack): HgbA1c, fasting lipid panel, she is currently not on a statin will start high-dose statin therapy. MRI, MRA of the brain without contrast will use Ativan for sedation. CT a of the head and neck showed chronic right MCA infarct no acute hemorrhage, severe distal P2 stenoses, with a right MCA stent with about 50% stenosis PT, OT, Speech consult  Echocardiogram  Prophylactic therapy-Antiplatelet med: Continue aspirin and Brilinta Transthoracic Echo,   BP goal: permissive HTN upto 220/120 mmHg Consult neurology.  History of left hemiparesis ([place]) She has a persistent facial droop on the left side.  Diabetes mellitus type 2 in obese [facility]) We will do oral  hypoglycemic agent, will start her on sliding scale insulin.  Essential hypertension Hold antihypertensive medication will allow permissive hypertension.     DVT Prophylaxis: lovenox Code Status: full  Family Communication: none  Disposition Plan: Admit under observation    Charlynne Cousins MD [facility]   11/18/2018, 5:04 PM

## 2018-11-18 NOTE — ED Triage Notes (Signed)
Pt transported from home by [facility]. EMS reports pt last seen normal was 11/17/18 @ 2300. Pt daughter checked on her today @ 1115 and called EMS d/t left sided facial drop. EMS reports no facial drop only asymmetrical face which has resolved.  Pt spanish speaking  No deficits noted at this time.

## 2018-11-19 ENCOUNTER — Observation Stay (HOSPITAL_COMMUNITY): Payer: Self-pay

## 2018-11-19 ENCOUNTER — Other Ambulatory Visit: Payer: Self-pay | Admitting: Student

## 2018-11-19 ENCOUNTER — Observation Stay (HOSPITAL_BASED_OUTPATIENT_CLINIC_OR_DEPARTMENT_OTHER): Payer: Self-pay

## 2018-11-19 DIAGNOSIS — G459 Transient cerebral ischemic attack, unspecified: Secondary | ICD-10-CM

## 2018-11-19 LAB — GLUCOSE, CAPILLARY
Glucose-Capillary: 105 mg/dL — ABNORMAL HIGH (ref 70–99)
Glucose-Capillary: 83 mg/dL (ref 70–99)
Glucose-Capillary: 82 mg/dL (ref 70–99)
Glucose-Capillary: 99 mg/dL (ref 70–99)

## 2018-11-19 LAB — LIPID PANEL
CHOLESTEROL: 134 mg/dL (ref 0–200)
HDL: 33 mg/dL — AB (ref >40)
LDL Cholesterol: 87 mg/dL (ref 0–99)
TRIGLYCERIDES: 69 mg/dL (ref <150)
Total CHOL/HDL Ratio: 4.1 RATIO
VLDL: 14 mg/dL (ref 0–40)

## 2018-11-19 LAB — ECHOCARDIOGRAM COMPLETE
Height: 65 in
Weight: 3474.45 oz

## 2018-11-19 MED ORDER — SODIUM CHLORIDE 0.9 % IV SOLN
INTRAVENOUS | Status: DC
  Administered 2018-11-19: 23:00:00 via INTRAVENOUS

## 2018-11-19 MED ORDER — ATORVASTATIN CALCIUM 10 MG PO TABS
10.0000 mg | ORAL_TABLET | Freq: Every day | ORAL | Status: DC
  Administered 2018-11-19 – 2018-11-20 (×2): 10 mg via ORAL
  Filled 2018-11-19 (×2): qty 1

## 2018-11-19 NOTE — Progress Notes (Signed)
EEG completed; results pending.    

## 2018-11-19 NOTE — Progress Notes (Signed)
[facility] PROGRESS NOTE    Progress Note  Cristen Bredeson  ZOX:[id number] DOB: 04/08/1956 DOA: 11/18/2018 PCP: Marcine Matar, MD     Brief Narrative:   Heather [name] is an 63 y.o. 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  Assessment/Plan:   TIA (transient ischemic attack): HDL less than 40 LDL 87, she is currently on high-dose statins. 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 CTA of the head showed a right MCA stent 50% stenosed PT eval is pending. Continue aspirin and Brilinta. Allow permissive hypertension, will not treat unless blood pressure greater than 220/110. Awaiting stroke team recommendations. Probably need to get interventional radiology involved.  History of left hemiparesis (HCC) Continue aspirin and Brilinta with a persistent facial droop.  Diabetes mellitus type 2 in obese (HCC) Continue sliding scale insulin. Continue to hold oral hypoglycemic agents.  Essential hypertension Hold antihypertensive medication allow permissive hypertension.    DVT prophylaxis: lovenxo Family Communication:daughter Disposition Plan/Barrier to D/C: Per neuro Code Status:     Code Status Orders  (From admission, onward)         Start     Ordered   11/18/18 1809  Full code  Continuous     11/18/18 1808        Code Status History    Date Active Date Inactive Code Status Order ID Comments User Context   12/19/2017 1700 12/28/2017 1406 Full Code [id number]  Lynnae Prude Inpatient   12/19/2017 1700 12/19/2017 1700 Full Code [id number]  Charlton Amor, PA-C Inpatient   12/18/2017 0514 12/19/2017 1643 Full Code [id number]  Julieanne Cotton, MD Inpatient   12/17/2017 2356 12/18/2017 0514 Full Code [id number]  Aroor, Dara Lords, MD ED        IV Access:    Peripheral IV   Procedures and diagnostic studies:   Ct Angio Head W Or Wo Contrast  Result Date: 11/18/2018 CLINICAL DATA:  Right facial droop. History of right MCA infarct and stenting. EXAM: CT ANGIOGRAPHY HEAD AND NECK TECHNIQUE: Multidetector CT imaging of the head and neck was performed using the standard protocol during bolus administration of intravenous contrast. Multiplanar CT image reconstructions and MIPs were obtained to evaluate the vascular anatomy. Carotid stenosis measurements (when applicable) are obtained utilizing NASCET criteria, using the distal internal carotid diameter as the denominator. CONTRAST:  50mL ISOVUE-370 IOPAMIDOL (ISOVUE-370) INJECTION 76% COMPARISON:  Head MRI/MRA 11/01/2018.  Head and neck CTA 12/17/2017. FINDINGS: CT HEAD FINDINGS Brain: There is no evidence of acute infarct, intracranial hemorrhage, mass, midline shift, or extra-axial fluid collection. A large, chronic right MCA infarct is again noted. Hypodensities elsewhere in the cerebral white matter bilaterally are nonspecific but compatible with mild chronic small vessel ischemic disease. The ventricles are normal in size aside from minimal ex vacuo dilatation of the right lateral ventricle. Vascular: As below. Skull: No fracture or focal osseous lesion. Sinuses: Minimal mucosal thickening in the maxillary sinuses. Clear mastoid air cells. Orbits: Unremarkable. Review of the MIP images confirms the above findings CTA NECK FINDINGS Aortic arch: Standard 3 vessel aortic arch with nonstenotic plaque at the left subclavian artery origin. Right carotid  system: Patent without evidence of stenosis or dissection. Tortuous cervical ICA. Left carotid system: Patent without evidence of stenosis or dissection. Vertebral arteries: Patent  without evidence of stenosis or dissection. Moderately to strongly dominant left vertebral artery. Skeleton: Mild cervical spondylosis. Other neck: No evidence of acute abnormality or mass. Upper chest: Clear lung apices. Review of the MIP images confirms the above findings CTA HEAD FINDINGS Anterior circulation: The internal carotid arteries are widely patent from skull base to carotid termini. A right MCA stent extending from the M1 to M2 segments is patent with a proximally 50% stenosis in its proximal to midportion. The left MCA and both ACAs are patent with mild-to-moderate branch vessel irregular narrowing but no evidence of proximal branch occlusion or significant A1 or M1 stenosis. The left A1 segment is extremely hypoplastic. No aneurysm is identified. Posterior circulation: The intracranial vertebral arteries are widely patent to the basilar. Patent left PICA, bilateral AICA, and bilateral SCA origins are identified. The basilar artery is widely patent. There are small right and medium-sized left posterior communicating arteries. The PCAs are patent, however there are severe mid to distal left P2 stenoses which are new from the 2019 CTA and progressive from the recent MRA. No aneurysm is identified. Venous sinuses: Patent. Anatomic variants: Hypoplastic left A1. Delayed phase: No abnormal enhancement. Review of the MIP images confirms the above findings IMPRESSION: 1. Chronic right MCA infarct. No definite acute infarct or hemorrhage. 2. 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 3. Patent right MCA stent with 50% intra-stent stenosis. 4. Widely patent cervical carotid and vertebral arteries. Electronically Signed   By: Sebastian Ache M.D.   On: 11/18/2018 16:06   Ct Angio Neck W Or Wo Contrast  Result Date: 11/18/2018 CLINICAL DATA:  Right facial droop. History of right MCA infarct and stenting. EXAM: CT ANGIOGRAPHY HEAD AND NECK TECHNIQUE: Multidetector CT imaging of the head and neck was  performed using the standard protocol during bolus administration of intravenous contrast. Multiplanar CT image reconstructions and MIPs were obtained to evaluate the vascular anatomy. Carotid stenosis measurements (when applicable) are obtained utilizing NASCET criteria, using the distal internal carotid diameter as the denominator. CONTRAST:  50mL ISOVUE-370 IOPAMIDOL (ISOVUE-370) INJECTION 76% COMPARISON:  Head MRI/MRA 11/01/2018.  Head and neck CTA 12/17/2017. FINDINGS: CT HEAD FINDINGS Brain: There is no evidence of acute infarct, intracranial hemorrhage, mass, midline shift, or extra-axial fluid collection. A large, chronic right MCA infarct is again noted. Hypodensities elsewhere in the cerebral white matter bilaterally are nonspecific but compatible with mild chronic small vessel ischemic disease. The ventricles are normal in size aside from minimal ex vacuo dilatation of the right lateral ventricle. Vascular: As below. Skull: No fracture or focal osseous lesion. Sinuses: Minimal mucosal thickening in the maxillary sinuses. Clear mastoid air cells. Orbits: Unremarkable. Review of the MIP images confirms the above findings CTA NECK FINDINGS Aortic arch: Standard 3 vessel aortic arch with nonstenotic plaque at the left subclavian artery origin. Right carotid system: Patent without evidence of stenosis or dissection. Tortuous cervical ICA. Left carotid system: Patent without evidence of stenosis or dissection. Vertebral arteries: Patent without evidence of stenosis or dissection. Moderately to strongly dominant left vertebral artery. Skeleton: Mild cervical spondylosis. Other neck: No evidence of acute abnormality or mass. Upper chest: Clear lung apices. Review of the MIP images confirms the above findings CTA HEAD FINDINGS Anterior circulation: The internal carotid arteries are widely patent from skull base to carotid termini. A right MCA stent extending  from the M1 to M2 segments is patent with a proximally  50% stenosis in its proximal to midportion. The left MCA and both ACAs are patent with mild-to-moderate branch vessel irregular narrowing but no evidence of proximal branch occlusion or significant A1 or M1 stenosis. The left A1 segment is extremely hypoplastic. No aneurysm is identified. Posterior circulation: The intracranial vertebral arteries are widely patent to the basilar. Patent left PICA, bilateral AICA, and bilateral SCA origins are identified. The basilar artery is widely patent. There are small right and medium-sized left posterior communicating arteries. The PCAs are patent, however there are severe mid to distal left P2 stenoses which are new from the 2019 CTA and progressive from the recent MRA. No aneurysm is identified. Venous sinuses: Patent. Anatomic variants: Hypoplastic left A1. Delayed phase: No abnormal enhancement. Review of the MIP images confirms the above findings IMPRESSION: 1. Chronic right MCA infarct. No definite acute infarct or hemorrhage. 2. 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 3. Patent right MCA stent with 50% intra-stent stenosis. 4. Widely patent cervical carotid and vertebral arteries. Electronically Signed   By: Sebastian AcheAllen  Grady M.D.   On: 11/18/2018 16:06   Mr Brain Wo Contrast  Result Date: 11/19/2018 CLINICAL DATA:  Initial evaluation for TIA, right head and facial twitching. EXAM: MRI HEAD WITHOUT CONTRAST MRA HEAD WITHOUT CONTRAST TECHNIQUE: Multiplanar, multiecho pulse sequences of the brain and surrounding structures were obtained without intravenous contrast. Angiographic images of the head were obtained using MRA technique without contrast. COMPARISON:  Prior CTA from earlier the same day as well as previous MRI from 11/01/2018. FINDINGS: MRI HEAD FINDINGS Brain: Cerebral volume within normal limits for age. Mild chronic microvascular ischemic changes present within the periventricular white matter and pons. Extensive encephalomalacia with gliosis  present within the right frontal lobe, compatible with chronic right MCA territory infarct. Associated chronic hemosiderin staining with scattered laminar necrosis. Mild wallerian degeneration at the right cerebral peduncle. Appearance is stable from previous. No abnormal foci of restricted diffusion to suggest acute or subacute ischemia. Gray-white matter differentiation otherwise maintained. No evidence for acute intracranial hemorrhage. No mass lesion, midline shift or mass effect. No hydrocephalus. No extra-axial fluid collection. Incidental note made of a partially empty sella. Pituitary gland otherwise unremarkable. Midline structures intact. Vascular: Susceptibility artifact from known right MCA stent. Normal vascular flow void seen distally within the right MCA branches. Remaining of the intracranial vascular flow voids maintained Skull and upper cervical spine: Craniocervical junction normal. Mild degenerative spondylolysis within the upper cervical spine without significant stenosis. No focal marrow replacing lesion. Scalp soft tissues unremarkable. Sinuses/Orbits: Globes and orbital soft tissues within normal limits. Mild scattered mucosal thickening within the ethmoidal air cells and maxillary sinuses. No air-fluid level to suggest acute sinusitis. Trace opacity right mastoid air cells, of doubtful significance. Inner ear structures grossly normal. Other: None. MRA HEAD FINDINGS ANTERIOR CIRCULATION: Examination mildly degraded by motion artifact. Distal cervical segments of the internal carotid arteries are patent with fairly symmetric antegrade flow. Distal cervical right ICA mildly tortuous. Petrous, cavernous, and supraclinoid segments patent without flow-limiting stenosis. Origin of the ophthalmic arteries patent. ICA termini well perfused. A1 segments grossly patent bilaterally. Left A1 hypoplastic. Anterior communicating artery grossly normal on this motion grade exam. Anterior cerebral arteries  patent to their distal aspects without appreciable stenosis. Susceptibility artifact from vascular stent at the right M1 segment extending into proximal M2 branch. Grossly patent flow through the stent, with flow related signal seen distally within  the right MCA branches. Stent stenosis and patency better seen on prior CTA. Left M1 widely patent. Normal left MCA bifurcation. Distal left MCA branches well perfused. POSTERIOR CIRCULATION: Vertebral arteries patent to the vertebrobasilar junction without stenosis. Dominant left vertebral artery. Patent left PICA. Right PICA not seen. Basilar widely patent to its distal aspect without stenosis. Superior cerebral arteries patent bilaterally. Right PCA largely supplied via the basilar, although a small right posterior communicating artery noted. Left PCA supplied via the basilar as well as a robust left posterior communicating artery. Moderate to advanced irregularity with stenoses involving the mid-distal left P2 segment, better seen on prior CTA. Multifocal atheromatous irregularity about the left P1 with mild to moderate stenoses. PCAs otherwise patent to their distal aspects. No intracranial aneurysm. IMPRESSION: MRI HEAD IMPRESSION: 1. No acute intracranial infarct or other abnormality. 2. Chronic hemorrhagic right MCA territory infarct. 3. Underlying mild chronic microvascular ischemic disease. MRA HEAD IMPRESSION: 1. Patent right MCA stent. Intra stent stenosis better evaluated on prior CTA from earlier same day. 2. Moderate multifocal left P1 stenoses, with moderate to advanced mid-distal left P2 stenoses, also seen on prior CTA. 3. Otherwise wide patency of the major arterial intracranial circulation. Electronically Signed   By: Rise Mu M.D.   On: 11/19/2018 00:44   Mr Maxine Glenn Head Wo Contrast  Result Date: 11/19/2018 CLINICAL DATA:  Initial evaluation for TIA, right head and facial twitching. EXAM: MRI HEAD WITHOUT CONTRAST MRA HEAD WITHOUT CONTRAST  TECHNIQUE: Multiplanar, multiecho pulse sequences of the brain and surrounding structures were obtained without intravenous contrast. Angiographic images of the head were obtained using MRA technique without contrast. COMPARISON:  Prior CTA from earlier the same day as well as previous MRI from 11/01/2018. FINDINGS: MRI HEAD FINDINGS Brain: Cerebral volume within normal limits for age. Mild chronic microvascular ischemic changes present within the periventricular white matter and pons. Extensive encephalomalacia with gliosis present within the right frontal lobe, compatible with chronic right MCA territory infarct. Associated chronic hemosiderin staining with scattered laminar necrosis. Mild wallerian degeneration at the right cerebral peduncle. Appearance is stable from previous. No abnormal foci of restricted diffusion to suggest acute or subacute ischemia. Gray-white matter differentiation otherwise maintained. No evidence for acute intracranial hemorrhage. No mass lesion, midline shift or mass effect. No hydrocephalus. No extra-axial fluid collection. Incidental note made of a partially empty sella. Pituitary gland otherwise unremarkable. Midline structures intact. Vascular: Susceptibility artifact from known right MCA stent. Normal vascular flow void seen distally within the right MCA branches. Remaining of the intracranial vascular flow voids maintained Skull and upper cervical spine: Craniocervical junction normal. Mild degenerative spondylolysis within the upper cervical spine without significant stenosis. No focal marrow replacing lesion. Scalp soft tissues unremarkable. Sinuses/Orbits: Globes and orbital soft tissues within normal limits. Mild scattered mucosal thickening within the ethmoidal air cells and maxillary sinuses. No air-fluid level to suggest acute sinusitis. Trace opacity right mastoid air cells, of doubtful significance. Inner ear structures grossly normal. Other: None. MRA HEAD FINDINGS  ANTERIOR CIRCULATION: Examination mildly degraded by motion artifact. Distal cervical segments of the internal carotid arteries are patent with fairly symmetric antegrade flow. Distal cervical right ICA mildly tortuous. Petrous, cavernous, and supraclinoid segments patent without flow-limiting stenosis. Origin of the ophthalmic arteries patent. ICA termini well perfused. A1 segments grossly patent bilaterally. Left A1 hypoplastic. Anterior communicating artery grossly normal on this motion grade exam. Anterior cerebral arteries patent to their distal aspects without appreciable stenosis. Susceptibility artifact from vascular stent at the  right M1 segment extending into proximal M2 branch. Grossly patent flow through the stent, with flow related signal seen distally within the right MCA branches. Stent stenosis and patency better seen on prior CTA. Left M1 widely patent. Normal left MCA bifurcation. Distal left MCA branches well perfused. POSTERIOR CIRCULATION: Vertebral arteries patent to the vertebrobasilar junction without stenosis. Dominant left vertebral artery. Patent left PICA. Right PICA not seen. Basilar widely patent to its distal aspect without stenosis. Superior cerebral arteries patent bilaterally. Right PCA largely supplied via the basilar, although a small right posterior communicating artery noted. Left PCA supplied via the basilar as well as a robust left posterior communicating artery. Moderate to advanced irregularity with stenoses involving the mid-distal left P2 segment, better seen on prior CTA. Multifocal atheromatous irregularity about the left P1 with mild to moderate stenoses. PCAs otherwise patent to their distal aspects. No intracranial aneurysm. IMPRESSION: MRI HEAD IMPRESSION: 1. No acute intracranial infarct or other abnormality. 2. Chronic hemorrhagic right MCA territory infarct. 3. Underlying mild chronic microvascular ischemic disease. MRA HEAD IMPRESSION: 1. Patent right MCA stent.  Intra stent stenosis better evaluated on prior CTA from earlier same day. 2. Moderate multifocal left P1 stenoses, with moderate to advanced mid-distal left P2 stenoses, also seen on prior CTA. 3. Otherwise wide patency of the major arterial intracranial circulation. Electronically Signed   By: Rise Mu M.D.   On: 11/19/2018 00:44     Medical Consultants:    None.  Anti-Infectives:   None  Subjective:    Shagun Wordell relates no events overnight feels better.  Objective:    Vitals:   11/18/18 2210 11/19/18 0010 11/19/18 0337 11/19/18 0733  BP: 99/61 110/76 95/72 (!) 91/56  Pulse: 82 75 64 75  Resp: Temp: 98 F (36.7 C) 97.8 F (36.6 C) 97.8 F (36.6 C) 97.7 F (36.5 C)  TempSrc: Oral Oral Oral Oral  SpO2: 99% 97% 100% 100%  Weight:      Height:       No intake or output data in the 24 hours ending 11/19/18 0819 Filed Weights   11/18/18 1222  Weight: 98.5 kg    Exam: General exam: In no acute distress. Respiratory system: Good air movement and clear to auscultation. Cardiovascular system: S1 & S2 heard, RRR.  Gastrointestinal system: Abdomen is nondistended, soft and nontender.  Central nervous system: Alert and oriented. No focal neurological deficits. Extremities: No pedal edema. Skin: No rashes, lesions or ulcers Psychiatry: Judgement and insight appear normal. Mood & affect appropriate.    Data Reviewed:    Labs: Basic Metabolic Panel: Recent Labs  Lab 11/18/18 1422  NA 141  K 4.2  CL 110  CO2 27  GLUCOSE 104*  BUN 12  CREATININE 0.56  CALCIUM 9.6   GFR Estimated Creatinine Clearance: 83.6 mL/min (by C-G formula based on SCr of 0.56 mg/dL). Liver Function Tests: Recent Labs  Lab 11/18/18 1422  AST 21  ALT 24  ALKPHOS 68  BILITOT 0.7  PROT 7.4  ALBUMIN 4.1   No results for input(s): LIPASE, AMYLASE in the last 168 hours. No results for input(s): AMMONIA in the last 168 hours. Coagulation profile Recent  Labs  Lab 11/18/18 1422  INR 0.92    CBC: Recent Labs  Lab 11/18/18 1422  WBC 8.8  NEUTROABS 6.1  HGB 15.0  HCT 48.0*  MCV 92.7  PLT 262   Cardiac Enzymes: No results for input(s): CKTOTAL, CKMB, CKMBINDEX, TROPONINI in  the last 168 hours. BNP (last 3 results) No results for input(s): PROBNP in the last 8760 hours. CBG: Recent Labs  Lab 11/18/18 1853 11/18/18 2117 11/19/18 0615  GLUCAP 75 129* 105*   D-Dimer: No results for input(s): DDIMER in the last 72 hours. Hgb A1c: No results for input(s): HGBA1C in the last 72 hours. Lipid Profile: Recent Labs    11/19/18 0554  CHOL 134  HDL 33*  LDLCALC 87  TRIG 69  CHOLHDL 4.1   Thyroid function studies: No results for input(s): TSH, T4TOTAL, T3FREE, THYROIDAB in the last 72 hours.  Invalid input(s): FREET3 Anemia work up: No results for input(s): VITAMINB12, FOLATE, FERRITIN, TIBC, IRON, RETICCTPCT in the last 72 hours. Sepsis Labs: Recent Labs  Lab 11/18/18 1422  WBC 8.8   Microbiology No results found for this or any previous visit (from the past 240 hour(s)).   Medications:   .  stroke: mapping our early stages of recovery book   Does not apply Once  . aspirin  81 mg Oral Daily  . atorvastatin  80 mg Oral q1800  . enoxaparin (LOVENOX) injection  40 mg Subcutaneous Q24H  . insulin aspart  0-5 Units Subcutaneous QHS  . insulin aspart  0-9 Units Subcutaneous TID WC  . insulin aspart  3 Units Subcutaneous TID WC  . insulin detemir  5 Units Subcutaneous BID  . sertraline  50 mg Oral Daily  . ticagrelor  90 mg Oral BID   Continuous Infusions: . sodium chloride        LOS: 0 days   Marinda Elk  [facility]  11/19/2018, 8:19 AM

## 2018-11-19 NOTE — Progress Notes (Addendum)
STROKE TEAM PROGRESS NOTE   INTERVAL HISTORY Her daughter is at the bedside.  She and her daughter recounted the HPI. EEG tech also present. History of present illness reviewed with the patient does not seem typical for a stroke or TIA  Vitals:   11/18/18 2210 11/19/18 0010 11/19/18 0337 11/19/18 0733  BP: 99/61 110/76 95/72 (!) 91/56  Pulse: 82 75 64 75  Resp: Temp: 98 F (36.7 C) 97.8 F (36.6 C) 97.8 F (36.6 C) 97.7 F (36.5 C)  TempSrc: Oral Oral Oral Oral  SpO2: 99% 97% 100% 100%  Weight:      Height:        CBC:  Recent Labs  Lab 11/18/18 1422  WBC 8.8  NEUTROABS 6.1  HGB 15.0  HCT 48.0*  MCV 92.7  PLT 262    Basic Metabolic Panel:  Recent Labs  Lab 11/18/18 1422  NA 141  K 4.2  CL 110  CO2 27  GLUCOSE 104*  BUN 12  CREATININE 0.56  CALCIUM 9.6   Lipid Panel:     Component Value Date/Time   CHOL 134 11/19/2018 0554   TRIG 69 11/19/2018 0554   HDL 33 (L) 11/19/2018 0554   CHOLHDL 4.1 11/19/2018 0554   VLDL 14 11/19/2018 0554   LDLCALC 87 11/19/2018 0554   HgbA1c:  Lab Results  Component Value Date   HGBA1C 5.5 05/08/2018   Urine Drug Screen:     Component Value Date/Time   LABOPIA NONE DETECTED 11/18/2018 1248   COCAINSCRNUR NONE DETECTED 11/18/2018 1248   LABBENZ NONE DETECTED 11/18/2018 1248   AMPHETMU NONE DETECTED 11/18/2018 1248   THCU NONE DETECTED 11/18/2018 1248   LABBARB NONE DETECTED 11/18/2018 1248    Alcohol Level     Component Value Date/Time   ETH <10 11/18/2018 1422    IMAGING Ct Angio Head W Or Wo Contrast  Result Date: 11/18/2018 CLINICAL DATA:  Right facial droop. History of right MCA infarct and stenting. EXAM: CT ANGIOGRAPHY HEAD AND NECK TECHNIQUE: Multidetector CT imaging of the head and neck was performed using the standard protocol during bolus administration of intravenous contrast. Multiplanar CT image reconstructions and MIPs were obtained to evaluate the vascular anatomy. Carotid stenosis  measurements (when applicable) are obtained utilizing NASCET criteria, using the distal internal carotid diameter as the denominator. CONTRAST:  50mL ISOVUE-370 IOPAMIDOL (ISOVUE-370) INJECTION 76% COMPARISON:  Head MRI/MRA 11/01/2018.  Head and neck CTA 12/17/2017. FINDINGS: CT HEAD FINDINGS Brain: There is no evidence of acute infarct, intracranial hemorrhage, mass, midline shift, or extra-axial fluid collection. A large, chronic right MCA infarct is again noted. Hypodensities elsewhere in the cerebral white matter bilaterally are nonspecific but compatible with mild chronic small vessel ischemic disease. The ventricles are normal in size aside from minimal ex vacuo dilatation of the right lateral ventricle. Vascular: As below. Skull: No fracture or focal osseous lesion. Sinuses: Minimal mucosal thickening in the maxillary sinuses. Clear mastoid air cells. Orbits: Unremarkable. Review of the MIP images confirms the above findings CTA NECK FINDINGS Aortic arch: Standard 3 vessel aortic arch with nonstenotic plaque at the left subclavian artery origin. Right carotid system: Patent without evidence of stenosis or dissection. Tortuous cervical ICA. Left carotid system: Patent without evidence of stenosis or dissection. Vertebral arteries: Patent without evidence of stenosis or dissection. Moderately to strongly dominant left vertebral artery. Skeleton: Mild cervical spondylosis. Other neck: No evidence of acute abnormality or mass. Upper chest: Clear lung apices. Review  of the MIP images confirms the above findings CTA HEAD FINDINGS Anterior circulation: The internal carotid arteries are widely patent from skull base to carotid termini. A right MCA stent extending from the M1 to M2 segments is patent with a proximally 50% stenosis in its proximal to midportion. The left MCA and both ACAs are patent with mild-to-moderate branch vessel irregular narrowing but no evidence of proximal branch occlusion or significant A1 or  M1 stenosis. The left A1 segment is extremely hypoplastic. No aneurysm is identified. Posterior circulation: The intracranial vertebral arteries are widely patent to the basilar. Patent left PICA, bilateral AICA, and bilateral SCA origins are identified. The basilar artery is widely patent. There are small right and medium-sized left posterior communicating arteries. The PCAs are patent, however there are severe mid to distal left P2 stenoses which are new from the 2019 CTA and progressive from the recent MRA. No aneurysm is identified. Venous sinuses: Patent. Anatomic variants: Hypoplastic left A1. Delayed phase: No abnormal enhancement. Review of the MIP images confirms the above findings IMPRESSION: 1. Chronic right MCA infarct. No definite acute infarct or hemorrhage. 2. 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 3. Patent right MCA stent with 50% intra-stent stenosis. 4. Widely patent cervical carotid and vertebral arteries. Electronically Signed   By: Sebastian Ache M.D.   On: 11/18/2018 16:06   Ct Angio Neck W Or Wo Contrast  Result Date: 11/18/2018 CLINICAL DATA:  Right facial droop. History of right MCA infarct and stenting. EXAM: CT ANGIOGRAPHY HEAD AND NECK TECHNIQUE: Multidetector CT imaging of the head and neck was performed using the standard protocol during bolus administration of intravenous contrast. Multiplanar CT image reconstructions and MIPs were obtained to evaluate the vascular anatomy. Carotid stenosis measurements (when applicable) are obtained utilizing NASCET criteria, using the distal internal carotid diameter as the denominator. CONTRAST:  69mL ISOVUE-370 IOPAMIDOL (ISOVUE-370) INJECTION 76% COMPARISON:  Head MRI/MRA 11/01/2018.  Head and neck CTA 12/17/2017. FINDINGS: CT HEAD FINDINGS Brain: There is no evidence of acute infarct, intracranial hemorrhage, mass, midline shift, or extra-axial fluid collection. A large, chronic right MCA infarct is again noted. Hypodensities  elsewhere in the cerebral white matter bilaterally are nonspecific but compatible with mild chronic small vessel ischemic disease. The ventricles are normal in size aside from minimal ex vacuo dilatation of the right lateral ventricle. Vascular: As below. Skull: No fracture or focal osseous lesion. Sinuses: Minimal mucosal thickening in the maxillary sinuses. Clear mastoid air cells. Orbits: Unremarkable. Review of the MIP images confirms the above findings CTA NECK FINDINGS Aortic arch: Standard 3 vessel aortic arch with nonstenotic plaque at the left subclavian artery origin. Right carotid system: Patent without evidence of stenosis or dissection. Tortuous cervical ICA. Left carotid system: Patent without evidence of stenosis or dissection. Vertebral arteries: Patent without evidence of stenosis or dissection. Moderately to strongly dominant left vertebral artery. Skeleton: Mild cervical spondylosis. Other neck: No evidence of acute abnormality or mass. Upper chest: Clear lung apices. Review of the MIP images confirms the above findings CTA HEAD FINDINGS Anterior circulation: The internal carotid arteries are widely patent from skull base to carotid termini. A right MCA stent extending from the M1 to M2 segments is patent with a proximally 50% stenosis in its proximal to midportion. The left MCA and both ACAs are patent with mild-to-moderate branch vessel irregular narrowing but no evidence of proximal branch occlusion or significant A1 or M1 stenosis. The left A1 segment is extremely hypoplastic. No aneurysm is identified.  Posterior circulation: The intracranial vertebral arteries are widely patent to the basilar. Patent left PICA, bilateral AICA, and bilateral SCA origins are identified. The basilar artery is widely patent. There are small right and medium-sized left posterior communicating arteries. The PCAs are patent, however there are severe mid to distal left P2 stenoses which are new from the 2019 CTA and  progressive from the recent MRA. No aneurysm is identified. Venous sinuses: Patent. Anatomic variants: Hypoplastic left A1. Delayed phase: No abnormal enhancement. Review of the MIP images confirms the above findings IMPRESSION: 1. Chronic right MCA infarct. No definite acute infarct or hemorrhage. 2. 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 3. Patent right MCA stent with 50% intra-stent stenosis. 4. Widely patent cervical carotid and vertebral arteries. Electronically Signed   By: Sebastian Ache M.D.   On: 11/18/2018 16:06   Mr Brain Wo Contrast  Result Date: 11/19/2018 CLINICAL DATA:  Initial evaluation for TIA, right head and facial twitching. EXAM: MRI HEAD WITHOUT CONTRAST MRA HEAD WITHOUT CONTRAST TECHNIQUE: Multiplanar, multiecho pulse sequences of the brain and surrounding structures were obtained without intravenous contrast. Angiographic images of the head were obtained using MRA technique without contrast. COMPARISON:  Prior CTA from earlier the same day as well as previous MRI from 11/01/2018. FINDINGS: MRI HEAD FINDINGS Brain: Cerebral volume within normal limits for age. Mild chronic microvascular ischemic changes present within the periventricular white matter and pons. Extensive encephalomalacia with gliosis present within the right frontal lobe, compatible with chronic right MCA territory infarct. Associated chronic hemosiderin staining with scattered laminar necrosis. Mild wallerian degeneration at the right cerebral peduncle. Appearance is stable from previous. No abnormal foci of restricted diffusion to suggest acute or subacute ischemia. Gray-white matter differentiation otherwise maintained. No evidence for acute intracranial hemorrhage. No mass lesion, midline shift or mass effect. No hydrocephalus. No extra-axial fluid collection. Incidental note made of a partially empty sella. Pituitary gland otherwise unremarkable. Midline structures intact. Vascular: Susceptibility artifact  from known right MCA stent. Normal vascular flow void seen distally within the right MCA branches. Remaining of the intracranial vascular flow voids maintained Skull and upper cervical spine: Craniocervical junction normal. Mild degenerative spondylolysis within the upper cervical spine without significant stenosis. No focal marrow replacing lesion. Scalp soft tissues unremarkable. Sinuses/Orbits: Globes and orbital soft tissues within normal limits. Mild scattered mucosal thickening within the ethmoidal air cells and maxillary sinuses. No air-fluid level to suggest acute sinusitis. Trace opacity right mastoid air cells, of doubtful significance. Inner ear structures grossly normal. Other: None. MRA HEAD FINDINGS ANTERIOR CIRCULATION: Examination mildly degraded by motion artifact. Distal cervical segments of the internal carotid arteries are patent with fairly symmetric antegrade flow. Distal cervical right ICA mildly tortuous. Petrous, cavernous, and supraclinoid segments patent without flow-limiting stenosis. Origin of the ophthalmic arteries patent. ICA termini well perfused. A1 segments grossly patent bilaterally. Left A1 hypoplastic. Anterior communicating artery grossly normal on this motion grade exam. Anterior cerebral arteries patent to their distal aspects without appreciable stenosis. Susceptibility artifact from vascular stent at the right M1 segment extending into proximal M2 branch. Grossly patent flow through the stent, with flow related signal seen distally within the right MCA branches. Stent stenosis and patency better seen on prior CTA. Left M1 widely patent. Normal left MCA bifurcation. Distal left MCA branches well perfused. POSTERIOR CIRCULATION: Vertebral arteries patent to the vertebrobasilar junction without stenosis. Dominant left vertebral artery. Patent left PICA. Right PICA not seen. Basilar widely patent to its distal aspect without  stenosis. Superior cerebral arteries patent  bilaterally. Right PCA largely supplied via the basilar, although a small right posterior communicating artery noted. Left PCA supplied via the basilar as well as a robust left posterior communicating artery. Moderate to advanced irregularity with stenoses involving the mid-distal left P2 segment, better seen on prior CTA. Multifocal atheromatous irregularity about the left P1 with mild to moderate stenoses. PCAs otherwise patent to their distal aspects. No intracranial aneurysm. IMPRESSION: MRI HEAD IMPRESSION: 1. No acute intracranial infarct or other abnormality. 2. Chronic hemorrhagic right MCA territory infarct. 3. Underlying mild chronic microvascular ischemic disease. MRA HEAD IMPRESSION: 1. Patent right MCA stent. Intra stent stenosis better evaluated on prior CTA from earlier same day. 2. Moderate multifocal left P1 stenoses, with moderate to advanced mid-distal left P2 stenoses, also seen on prior CTA. 3. Otherwise wide patency of the major arterial intracranial circulation. Electronically Signed   By: Rise Mu M.D.   On: 11/19/2018 00:44   Mr Maxine Glenn Head Wo Contrast  Result Date: 11/19/2018 CLINICAL DATA:  Initial evaluation for TIA, right head and facial twitching. EXAM: MRI HEAD WITHOUT CONTRAST MRA HEAD WITHOUT CONTRAST TECHNIQUE: Multiplanar, multiecho pulse sequences of the brain and surrounding structures were obtained without intravenous contrast. Angiographic images of the head were obtained using MRA technique without contrast. COMPARISON:  Prior CTA from earlier the same day as well as previous MRI from 11/01/2018. FINDINGS: MRI HEAD FINDINGS Brain: Cerebral volume within normal limits for age. Mild chronic microvascular ischemic changes present within the periventricular white matter and pons. Extensive encephalomalacia with gliosis present within the right frontal lobe, compatible with chronic right MCA territory infarct. Associated chronic hemosiderin staining with scattered  laminar necrosis. Mild wallerian degeneration at the right cerebral peduncle. Appearance is stable from previous. No abnormal foci of restricted diffusion to suggest acute or subacute ischemia. Gray-white matter differentiation otherwise maintained. No evidence for acute intracranial hemorrhage. No mass lesion, midline shift or mass effect. No hydrocephalus. No extra-axial fluid collection. Incidental note made of a partially empty sella. Pituitary gland otherwise unremarkable. Midline structures intact. Vascular: Susceptibility artifact from known right MCA stent. Normal vascular flow void seen distally within the right MCA branches. Remaining of the intracranial vascular flow voids maintained Skull and upper cervical spine: Craniocervical junction normal. Mild degenerative spondylolysis within the upper cervical spine without significant stenosis. No focal marrow replacing lesion. Scalp soft tissues unremarkable. Sinuses/Orbits: Globes and orbital soft tissues within normal limits. Mild scattered mucosal thickening within the ethmoidal air cells and maxillary sinuses. No air-fluid level to suggest acute sinusitis. Trace opacity right mastoid air cells, of doubtful significance. Inner ear structures grossly normal. Other: None. MRA HEAD FINDINGS ANTERIOR CIRCULATION: Examination mildly degraded by motion artifact. Distal cervical segments of the internal carotid arteries are patent with fairly symmetric antegrade flow. Distal cervical right ICA mildly tortuous. Petrous, cavernous, and supraclinoid segments patent without flow-limiting stenosis. Origin of the ophthalmic arteries patent. ICA termini well perfused. A1 segments grossly patent bilaterally. Left A1 hypoplastic. Anterior communicating artery grossly normal on this motion grade exam. Anterior cerebral arteries patent to their distal aspects without appreciable stenosis. Susceptibility artifact from vascular stent at the right M1 segment extending into  proximal M2 branch. Grossly patent flow through the stent, with flow related signal seen distally within the right MCA branches. Stent stenosis and patency better seen on prior CTA. Left M1 widely patent. Normal left MCA bifurcation. Distal left MCA branches well perfused. POSTERIOR CIRCULATION: Vertebral arteries patent to the vertebrobasilar  junction without stenosis. Dominant left vertebral artery. Patent left PICA. Right PICA not seen. Basilar widely patent to its distal aspect without stenosis. Superior cerebral arteries patent bilaterally. Right PCA largely supplied via the basilar, although a small right posterior communicating artery noted. Left PCA supplied via the basilar as well as a robust left posterior communicating artery. Moderate to advanced irregularity with stenoses involving the mid-distal left P2 segment, better seen on prior CTA. Multifocal atheromatous irregularity about the left P1 with mild to moderate stenoses. PCAs otherwise patent to their distal aspects. No intracranial aneurysm. IMPRESSION: MRI HEAD IMPRESSION: 1. No acute intracranial infarct or other abnormality. 2. Chronic hemorrhagic right MCA territory infarct. 3. Underlying mild chronic microvascular ischemic disease. MRA HEAD IMPRESSION: 1. Patent right MCA stent. Intra stent stenosis better evaluated on prior CTA from earlier same day. 2. Moderate multifocal left P1 stenoses, with moderate to advanced mid-distal left P2 stenoses, also seen on prior CTA. 3. Otherwise wide patency of the major arterial intracranial circulation. Electronically Signed   By: Rise MuBenjamin  McClintock M.D.   On: 11/19/2018 00:44    PHYSICAL EXAM Pleasant middle-age Hispanic lady not in distress. . Afebrile. Head is nontraumatic. Neck is supple without bruit.    Cardiac exam no murmur or gallop. Lungs are clear to auscultation. Distal pulses are well felt.   Neurological Exam ; daughter interprets for her for this exam Awake  Alert oriented x 3.  Normal speech and language.eye movements full without nystagmus.fundi were not visualized. Vision acuity and fields appear normal. Hearing is normal. Palatal movements are normal. Face symmetric. Tongue midline. Normal strength, tone, reflexes and coordination. Normal sensation. Gait deferred.   ASSESSMENT/PLAN [name] is a 63 y.o. female with history of stroke w/ stent and HTN presenting with R facial and R head twitching x 10 min then resolved.   Face twitch w/ Anxiety  CTA head & neck chronic R MCA infarct. No new infarct. progressive severe L P2 stenosis. Patent R MCA stent s/ 50% intra-stent stenosis.   MRI  No acute stroke. Old R MCA infarct. Small vessel disease.   MRA  Patent R MCA stent. Mod multifocial L P1 stenosis, mod to adv L P2 stenosis.  2D Echo  pending   EEG normal, no sz  LDL 87  HgbA1c 5.5  Lovenox 40 mg sq daily for VTE prophylaxis  aspirin 81 mg daily and Brilinta 90 bid prior to admission, now on aspirin 81 mg daily and Brilinta 90 bid. Continue at d/c.   Therapy recommendations:  No PT  Disposition:  pending   Ok for d/c from stroke standpoint once 2D resulted  Hypertension  Stable . 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 Testing at bedside accurate but slow. (recall 3/3 after 4 mins, clock draw ok)  [facility]  [facility] day #  0  Annie MainSharon Biby, MSN, APRN, ANVP-BC, AGPCNP-BC  Advanced Practice Stroke Nurse [facility] See Amion for Schedule & Pager information 11/19/2018 10:57 AM  I have personally obtained history,examined this patient, reviewed notes, independently viewed imaging studies, participated in medical decision making and plan of care.ROS completed by me personally and pertinent positives fully documented  I have made any additions or clarifications directly to the above note. Agree with note above.  She presented with transient episode of face drawing and twitching lasting only 10 minutes. There is no definite focal neurological deficits. MRI is negative for acute stroke. Etiology of this episode appears unclear. Recommend check EEG. CT angiogram shows only 50% in-stent restenosis. Patient may be discharged home after EEG. Follow-up with Dr. Corliss Skainseveshwar is sent outpatient for stent follow-up. Greater than 50% time during this 25 minute visit was spent on counseling and coordination of care about her presenting episode and answering questions  Delia HeadyPramod Shaheem Pichon, MD Medical Director [facility] Pager: [phone] 11/19/2018 4:51 PM  To contact Stroke Continuity provider, please refer to [email or web address]. After hours, contact General Neurology

## 2018-11-19 NOTE — Progress Notes (Signed)
  Echocardiogram 2D Echocardiogram has been performed.  Heather [name] 11/19/2018, 9:42 AM

## 2018-11-19 NOTE — Procedures (Signed)
ELECTROENCEPHALOGRAM REPORT   Patient: Heather [name]       Room #: 8Q76P EEG No. ID: [id number] Age: 63 y.o.        Sex: female Referring Physician: David Stall Report Date:  11/19/2018        Interpreting Physician: Thana Farr  History: Abijah Riedel is an 63 y.o. female with a history of stroke presenting with an episode of right face and hand twitching  Medications:  Brilinta, Zoloft, Insulin, Lipitor, ASA  Conditions of Recording:  This is a 21 channel routine scalp EEG performed with bipolar and monopolar montages arranged in accordance to the international 10/20 system of electrode placement. One channel was dedicated to EKG recording.  The patient is in the awake, drowsy and asleep states.  Description:  The patient is being examined during much of the recording therefore muscle and movement artifact are prominent.  When able to be evaluated the waking background activity consists of a low voltage, symmetrical, fairly well organized, 11 Hz alpha activity, seen from the parieto-occipital and posterior temporal regions.  Low voltage fast activity, poorly organized, is seen anteriorly and is at times superimposed on more posterior regions.  A mixture of theta and alpha rhythms are seen from the central and temporal regions. The patient drowses with slowing to irregular, low voltage theta and beta activity.   The patient goes in to a light sleep with symmetrical sleep spindles, vertex central sharp transients and irregular slow activity.  No epileptiform activity is noted.   Hyperventilation and intermittent photic stimulation were not performed.   IMPRESSION: Normal electroencephalogram, awake and asleep. There are no focal lateralizing or epileptiform features.   Thana Farr, MD Neurology [phone] 11/19/2018, 12:46 PM

## 2018-11-19 NOTE — Evaluation (Signed)
Occupational Therapy Evaluation Patient Details Name: Heather [name] MRN: [id number] DOB: Oct 10, 1955 Today's Date: 11/19/2018    History of Present Illness Patient is a 63 y/o female who presents with left facial droop. NIH:0. CTA- 50% intrastent stenosis. 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   Clinical Impression   PTA, pt was living with her daughter and was independent. Pt performing at Mod I level for ADLs and functional mobility with increased time as needed. Pt denies any numbness, vision deficits, and weakness. Educated pt on BEFA[employer] for stroke signs and symptoms. Interpreter used throughout.  Recommend dc home once medically stable per physician. No further acute OT needs. Will sign off.     Follow Up Recommendations  No OT follow up    Equipment Recommendations  None recommended by OT    Recommendations for Other Services       Precautions / Restrictions Precautions Precautions: None Restrictions Weight Bearing Restrictions: No      Mobility Bed Mobility Overal bed mobility: Modified Independent             General bed mobility comments: No assist needed.   Transfers Overall transfer level: Modified independent Equipment used: None             General transfer comment: Stood from EOB x1. No dizziness.    Balance Overall balance assessment: Needs assistance Sitting-balance support: Feet supported;No upper extremity supported Sitting balance-Leahy Scale: Good     Standing balance support: During functional activity Standing balance-Leahy Scale: Fair                             ADL either performed or assessed with clinical judgement   ADL Overall ADL's : Modified independent                                       General ADL Comments: Increased time as needed. Near baseline     Vision Patient Visual Report: No change from baseline        Perception     Praxis      Pertinent Vitals/Pain Pain Assessment: No/denies pain     Hand Dominance Right   Extremity/Trunk Assessment Upper Extremity Assessment Upper Extremity Assessment: Overall WFL for tasks assessed   Lower Extremity Assessment Lower Extremity Assessment: Overall WFL for tasks assessed   Cervical / Trunk Assessment Cervical / Trunk Assessment: Normal   Communication Communication Communication: Prefers language other than English(Spanish interpreter [id number] #[id number])   Cognition Arousal/Alertness: Awake/alert Behavior During Therapy: WFL for tasks assessed/performed Overall Cognitive Status: Within Functional Limits for tasks assessed                                 General Comments: Able to recall 3/3 words within session. A&Ox4.   General Comments  VSS throughout    Exercises     Shoulder Instructions      Home Living Family/patient expects to be discharged to:: Private residence Living Arrangements: Other relatives(granddaughter) Available Help at Discharge: Family;Available PRN/intermittently Type of Home: Apartment(second floor) Home Access: Stairs to enter Entrance Stairs-Number of Steps: 2 short flights Entrance Stairs-Rails: Right Home Layout: One level     Bathroom Shower/Tub: Chief Strategy Officer: Standard  Home Equipment: None          Prior Functioning/Environment Level of Independence: Independent        Comments: ADLS, IADLs, and babysits grandkids. No driving.        OT Problem List: Decreased activity tolerance;Impaired balance (sitting and/or standing);Decreased knowledge of use of DME or AE;Decreased knowledge of precautions      OT Treatment/Interventions:      OT Goals(Current goals can be found in the care plan section) Acute Rehab OT Goals Patient Stated Goal: to go home OT Goal Formulation: All assessment and education complete, DC therapy  OT Frequency:      Barriers to D/C:            Co-evaluation              AM-PAC OT "6 Clicks" Daily Activity     Outcome Measure Help from another person eating meals?: None Help from another person taking care of personal grooming?: None Help from another person toileting, which includes using toliet, bedpan, or urinal?: None Help from another person bathing (including washing, rinsing, drying)?: None Help from another person to put on and taking off regular upper body clothing?: None Help from another person to put on and taking off regular lower body clothing?: None 6 Click Score: 24   End of Session Equipment Utilized During Treatment: Gait belt Nurse Communication: Mobility status  Activity Tolerance: Patient tolerated treatment well Patient left: in chair;with call bell/phone within reach;with chair alarm set;with nursing/sitter in room  OT Visit Diagnosis: Unsteadiness on feet (R26.81);Other symptoms and signs involving cognitive function                Time: 0812-0831 OT Time Calculation (min): 19 min Charges:  OT General Charges $OT Visit: 1 Visit OT Evaluation $OT Eval Low Complexity: 1 Low  Tereso Unangst MSOT, OTR/L Acute Rehab Pager: [phone] Office: [phone]  Heather [name] 11/19/2018, 9:13 AM

## 2018-11-19 NOTE — Care Management Note (Signed)
Case Management Note  Patient Details  Name: Heather [name] MRN: [id number] Date of Birth: Feb 17, 1956  Subjective/Objective:    Pt in with left sided hemiparesis. She is from home with her granddaughter.   DME: none No insurance but goes to [facility] for [facility] and uses them for her medications. No issues with transportation.               Action/Plan: No f/u per PT/OT and no DME needs. Pt will need any new prescriptions sent to [facility] pharmacy prior to 4 pm so her family can pick up the medications.  Pt has transportation home when medically ready.   Expected Discharge Date:                  Expected Discharge Plan:  Home/Self Care  In-House Referral:     Discharge planning Services     Post Acute Care Choice:    Choice offered to:     DME Arranged:    DME Agency:     HH Arranged:    HH Agency:     Status of Service:  In process, will continue to follow  If discussed at Long Length of Stay Meetings, dates discussed:    Additional Comments:  Kermit Balo, RN 11/19/2018, 12:00 PM

## 2018-11-19 NOTE — Evaluation (Signed)
Physical Therapy Evaluation Patient Details Name: Heather [name] MRN: [id number] DOB: [birth year] Today's Date: 11/19/2018   History of Present Illness  Patient is a 63 y/o female who presents with left facial droop. NIH:0. CTA- 50% intrastent stenosis. 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  Clinical Impression  Patient tolerated transfers, gait training and stair training with supervision-Mod I for safety. Pt independent PTA and cares for grandchildren. Reports all deficits have resolved. Pt functioning close to baseline and does not require skilled therapy services. All education completed. Discharge from therapy.  I have discussed the patient's current level of function related to stroke with the patient vis interpreter.  They acknowledge understanding of this and feel they can return home with support of their family.       Follow Up Recommendations No PT follow up;Supervision - Intermittent    Equipment Recommendations  None recommended by PT    Recommendations for Other Services       Precautions / Restrictions Precautions Precautions: None Restrictions Weight Bearing Restrictions: No      Mobility  Bed Mobility Overal bed mobility: Modified Independent             General bed mobility comments: No assist needed.   Transfers Overall transfer level: Modified independent Equipment used: None             General transfer comment: Stood from EOB x1. No dizziness.  Ambulation/Gait Ambulation/Gait assistance: Supervision Gait Distance (Feet): 300 Feet Assistive device: None Gait Pattern/deviations: Step-through pattern;Decreased stride length;Drifts right/left   Gait velocity interpretation: 1.31 - 2.62 ft/sec, indicative of limited community ambulator General Gait Details: Slow, steady gait with mild drifting noted with head turns but this improved with distance.  Stairs Stairs:  Yes Stairs assistance: Supervision Stair Management: One rail Left Number of Stairs: 13 General stair comments: Cues for technique/safety.   Wheelchair Mobility    Modified Rankin (Stroke Patients Only) Modified Rankin (Stroke Patients Only) Pre-Morbid Rankin Score: Slight disability Modified Rankin: Slight disability     Balance Overall balance assessment: Needs assistance Sitting-balance support: Feet supported;No upper extremity supported Sitting balance-Leahy Scale: Good     Standing balance support: During functional activity Standing balance-Leahy Scale: Fair                               Pertinent Vitals/Pain Pain Assessment: No/denies pain    Home Living Family/patient expects to be discharged to:: Private residence Living Arrangements: Other relatives(granddaughter) Available Help at Discharge: Family;Available PRN/intermittently Type of Home: Apartment(second floor) Home Access: Stairs to enter Entrance Stairs-Rails: Right Entrance Stairs-Number of Steps: 2 short flights Home Layout: One level Home Equipment: None      Prior Function Level of Independence: Independent         Comments: ADLS, IADLs, and babysits grandkids. No driving.     Hand Dominance   Dominant Hand: Right    Extremity/Trunk Assessment   Upper Extremity Assessment Upper Extremity Assessment: Defer to OT evaluation    Lower Extremity Assessment Lower Extremity Assessment: Overall WFL for tasks assessed    Cervical / Trunk Assessment Cervical / Trunk Assessment: Normal  Communication   Communication: Prefers language other than English(Spanish interpreter [id number] #[id number])  Cognition Arousal/Alertness: Awake/alert Behavior During Therapy: WFL for tasks assessed/performed Overall Cognitive Status: Within Functional Limits for tasks assessed  General Comments: Able to recall 3/3 words within session. A&Ox4.       General Comments General comments (skin integrity, edema, etc.): VSS throughout.    Exercises     Assessment/Plan    PT Assessment Patent does not need any further PT services  PT Problem List         PT Treatment Interventions      PT Goals (Current goals can be found in the Care Plan section)  Acute Rehab PT Goals Patient Stated Goal: to go home PT Goal Formulation: All assessment and education complete, DC therapy    Frequency     Barriers to discharge        Co-evaluation               AM-PAC PT "6 Clicks" Mobility  Outcome Measure Help needed turning from your back to your side while in a flat bed without using bedrails?: None Help needed moving from lying on your back to sitting on the side of a flat bed without using bedrails?: None Help needed moving to and from a bed to a chair (including a wheelchair)?: None Help needed standing up from a chair using your arms (e.g., wheelchair or bedside chair)?: None Help needed to walk in hospital room?: None Help needed climbing 3-5 steps with a railing? : A Little 6 Click Score: 23    End of Session Equipment Utilized During Treatment: Gait belt Activity Tolerance: Patient tolerated treatment well Patient left: in bed;with call bell/phone within reach;Other (comment)(OT present in room) Nurse Communication: Mobility status PT Visit Diagnosis: Difficulty in walking, not elsewhere classified (R26.2);Muscle weakness (generalized) (M62.81)    Time: 0812-0826 PT Time Calculation (min) (ACUTE ONLY): 14 min   Charges:   PT Evaluation $PT Eval Low Complexity: 1 Low          Mylo Red, PT, DPT Acute Rehabilitation Services Pager [phone] Office [phone]      Blake Divine A Lanier Ensign 11/19/2018, 9:05 AM

## 2018-11-19 NOTE — Evaluation (Signed)
Speech Language Pathology Evaluation Patient Details Name: Heather [name] MRN: [id number] DOB: [birth year] Today's Date: 11/19/2018 Time: 2440-1027 SLP Time Calculation (min) (ACUTE ONLY): 20 min  Problem List:  Patient Active Problem List   Diagnosis Date Noted  . TIA (transient ischemic attack) 11/18/2018  . Depression 02/21/2018  . Essential hypertension 02/21/2018  . Cerebrovascular accident (CVA) due to embolism of right middle cerebral artery (HCC) 02/21/2018  . Diabetes mellitus type 2 in obese (HCC) 12/20/2017  . Right middle cerebral artery stroke (HCC) 12/19/2017  . Left hemiparesis (HCC)   . Dysphagia, oropharyngeal   . Middle cerebral artery embolism, 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   Past Medical History:  Past Medical History:  Diagnosis Date  . Hypertension   . Stroke [facility])    Past Surgical History:  Past Surgical History:  Procedure Laterality Date  . BREAST EXCISIONAL BIOPSY    . BREAST SURGERY     right breast  . CHOLECYSTECTOMY    . IR ANGIO INTRA EXTRACRAN SEL COM CAROTID INNOMINATE BILAT MOD SED  03/06/2018  . IR ANGIO VERTEBRAL SEL VERTEBRAL BILAT MOD SED  03/06/2018  . IR CT HEAD LTD  12/18/2017  . IR INTRA CRAN STENT  12/18/2017  . IR PERCUTANEOUS ART THROMBECTOMY/INFUSION INTRACRANIAL INC DIAG ANGIO  12/18/2017  . IR RADIOLOGIST EVAL & MGMT  01/31/2018  . RADIOLOGY WITH ANESTHESIA N/A 12/18/2017   Procedure: RADIOLOGY WITH ANESTHESIA;  Surgeon: Julieanne Cotton, MD;  Location: [facility] OR;  Service: Radiology;  Laterality: N/A;   HPI:  Heather [name] is a 63 y.o. 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 MRI was negative for acute intracranial infarct or other abnormality with chronic  hemorrhagic right MCA territory infarct and underlying mild chronic microvascular ischemic disease.   Assessment / Plan / Recommendation Clinical Impression  Pt is known to this wrtier from previous admission to CIR. Pt and her daughter recognized SLP. Pt presents with baseline functional cognitive linguistic abilities. Daughter endorses baseline function with further improvement noted after previous discharge. All acute deficits have results and no ST needs identified this admission. ST signed off.     SLP Assessment  SLP Recommendation/Assessment: Patient does not need any further Speech Lanaguage Pathology Services SLP Visit Diagnosis: Cognitive communication deficit (R41.841)    Follow Up Recommendations  None    Frequency and Duration           SLP Evaluation Cognition  Overall Cognitive Status: Within Functional Limits for tasks assessed Arousal/Alertness: Awake/alert Orientation Level: Oriented X4       Comprehension  Auditory Comprehension Overall Auditory Comprehension: Appears within functional limits for tasks assessed Visual Recognition/Discrimination Discrimination: Within Function Limits Reading Comprehension Reading Status: Not tested    Expression Expression Primary Mode of Expression: Verbal Verbal Expression Overall Verbal Expression: Appears within functional limits for tasks assessed Written Expression Dominant Hand: Right Written Expression: Not tested   Oral / Motor  Oral Motor/Sensory Function Overall Oral Motor/Sensory Function: Within functional limits Motor Speech Overall Motor Speech: Appears within functional limits for tasks assessed Respiration: Within functional limits Phonation: Normal Resonance: Within functional limits Articulation: Within functional limitis Intelligibility: Intelligible Motor Planning: Witnin functional limits Motor Speech Errors: Not applicable   GO  Cirilo Canner 11/19/2018, 10:31 AM

## 2018-11-20 ENCOUNTER — Ambulatory Visit (HOSPITAL_COMMUNITY)
Admission: RE | Admit: 2018-11-20 | Discharge: 2018-11-20 | Disposition: A | Discharge status: Home / Self Care | Payer: Self-pay | Source: Ambulatory Visit | Attending: Interventional Radiology | Admitting: Interventional Radiology

## 2018-11-20 DIAGNOSIS — Z8249 Family history of ischemic heart disease and other diseases of the circulatory system: Secondary | ICD-10-CM | POA: Insufficient documentation

## 2018-11-20 DIAGNOSIS — Z7982 Long term (current) use of aspirin: Secondary | ICD-10-CM | POA: Insufficient documentation

## 2018-11-20 DIAGNOSIS — I639 Cerebral infarction, unspecified: Secondary | ICD-10-CM

## 2018-11-20 DIAGNOSIS — R471 Dysarthria and anarthria: Secondary | ICD-10-CM

## 2018-11-20 DIAGNOSIS — Z8673 Personal history of transient ischemic attack (TIA), and cerebral infarction without residual deficits: Secondary | ICD-10-CM | POA: Insufficient documentation

## 2018-11-20 DIAGNOSIS — E785 Hyperlipidemia, unspecified: Secondary | ICD-10-CM | POA: Insufficient documentation

## 2018-11-20 DIAGNOSIS — Z823 Family history of stroke: Secondary | ICD-10-CM | POA: Insufficient documentation

## 2018-11-20 DIAGNOSIS — I10 Essential (primary) hypertension: Secondary | ICD-10-CM | POA: Insufficient documentation

## 2018-11-20 DIAGNOSIS — I6601 Occlusion and stenosis of right middle cerebral artery: Secondary | ICD-10-CM | POA: Insufficient documentation

## 2018-11-20 DIAGNOSIS — Z87891 Personal history of nicotine dependence: Secondary | ICD-10-CM | POA: Insufficient documentation

## 2018-11-20 HISTORY — PX: IR ANGIO VERTEBRAL SEL VERTEBRAL BILAT MOD SED: IMG5369

## 2018-11-20 HISTORY — PX: IR ANGIO INTRA EXTRACRAN SEL COM CAROTID INNOMINATE BILAT MOD SED: IMG5360

## 2018-11-20 LAB — BASIC METABOLIC PANEL
Anion gap: 6 (ref 5–15)
BUN: 11 mg/dL (ref 8–23)
CO2: 23 mmol/L (ref 22–32)
Calcium: 9 mg/dL (ref 8.9–10.3)
Chloride: 111 mmol/L (ref 98–111)
Creatinine, Ser: 0.47 mg/dL (ref 0.44–1.00)
GFR calc Af Amer: >60 mL/min (ref >60)
Glucose, Bld: 95 mg/dL (ref 70–99)
Potassium: 3.7 mmol/L (ref 3.5–5.1)
Sodium: 140 mmol/L (ref 135–145)

## 2018-11-20 LAB — HEMOGLOBIN A1C
Hgb A1c MFr Bld: 5.4 % (ref 4.8–5.6)
Mean Plasma Glucose: 108 mg/dL

## 2018-11-20 LAB — CBC
HCT: 41 % (ref 36.0–46.0)
Hemoglobin: 12.9 g/dL (ref 12.0–15.0)
MCH: 28.8 pg (ref 26.0–34.0)
MCHC: 31.5 g/dL (ref 30.0–36.0)
MCV: 91.5 fL (ref 80.0–100.0)
Platelets: 259 10*3/uL (ref 150–400)
RBC: 4.48 MIL/uL (ref 3.87–5.11)
RDW: 13.4 % (ref 11.5–15.5)
WBC: 7.9 10*3/uL (ref 4.0–10.5)
nRBC: 0 % (ref 0.0–0.2)

## 2018-11-20 LAB — GLUCOSE, CAPILLARY
Glucose-Capillary: 91 mg/dL (ref 70–99)
Glucose-Capillary: 94 mg/dL (ref 70–99)

## 2018-11-20 LAB — PROTIME-INR
INR: 0.98
Prothrombin Time: 12.9 seconds (ref 11.4–15.2)

## 2018-11-20 LAB — TSH: TSH: 1.023 u[IU]/mL (ref 0.350–4.500)

## 2018-11-20 LAB — APTT: aPTT: 33 seconds (ref 24–36)

## 2018-11-20 LAB — VITAMIN B12: VITAMIN B 12: 698 pg/mL (ref 180–914)

## 2018-11-20 MED ORDER — IOPAMIDOL (ISOVUE-300) INJECTION 61%
INTRAVENOUS | Status: AC
  Filled 2018-11-20: qty 50

## 2018-11-20 MED ORDER — HEPARIN SODIUM (PORCINE) 1000 UNIT/ML IJ SOLN
INTRAMUSCULAR | Status: AC | PRN
  Administered 2018-11-20: 1000 [IU] via INTRAVENOUS

## 2018-11-20 MED ORDER — MIDAZOLAM HCL 2 MG/2ML IJ SOLN
INTRAMUSCULAR | Status: AC
  Filled 2018-11-20: qty 2

## 2018-11-20 MED ORDER — HEPARIN SODIUM (PORCINE) 1000 UNIT/ML IJ SOLN
INTRAMUSCULAR | Status: AC
  Filled 2018-11-20: qty 1

## 2018-11-20 MED ORDER — LIDOCAINE HCL 1 % IJ SOLN
INTRAMUSCULAR | Status: AC
  Filled 2018-11-20: qty 20

## 2018-11-20 MED ORDER — FENTANYL CITRATE (PF) 100 MCG/2ML IJ SOLN
INTRAMUSCULAR | Status: AC | PRN
  Administered 2018-11-20: 25 ug via INTRAVENOUS

## 2018-11-20 MED ORDER — IOHEXOL 300 MG/ML  SOLN: 150.0000 mL | Freq: Once | INTRAMUSCULAR | Status: DC | PRN

## 2018-11-20 MED ORDER — ATORVASTATIN CALCIUM 10 MG PO TABS: 10.0000 mg | ORAL_TABLET | Freq: Every day | ORAL | 3 refills | Status: DC

## 2018-11-20 MED ORDER — SODIUM CHLORIDE 0.9 % IV SOLN: INTRAVENOUS | Status: AC

## 2018-11-20 MED ORDER — LIDOCAINE HCL 1 % IJ SOLN
INTRAMUSCULAR | Status: AC | PRN
  Administered 2018-11-20: 10 mL

## 2018-11-20 MED ORDER — MIDAZOLAM HCL 2 MG/2ML IJ SOLN
INTRAMUSCULAR | Status: AC | PRN
  Administered 2018-11-20: 1 mg via INTRAVENOUS

## 2018-11-20 MED ORDER — FENTANYL CITRATE (PF) 100 MCG/2ML IJ SOLN
INTRAMUSCULAR | Status: AC
  Filled 2018-11-20: qty 2

## 2018-11-20 NOTE — Care Management Note (Signed)
Case Management Note  Patient Details  Name: Heather [name] MRN: [id number] Date of Birth: 1956/05/12  Subjective/Objective:                    Action/Plan: Pt discharging home with self care. Pt uses [facility] pharmacy for assistance with her meds.  Pt has transportation home.   Expected Discharge Date:  11/20/18               Expected Discharge Plan:  Home/Self Care  In-House Referral:     Discharge planning Services     Post Acute Care Choice:    Choice offered to:     DME Arranged:    DME Agency:     HH Arranged:    HH Agency:     Status of Service:  Completed, signed off  If discussed at [employer] of Stay Meetings, dates discussed:    Additional Comments:  Kermit Balo, RN 11/20/2018, 12:19 PM

## 2018-11-20 NOTE — H&P (Signed)
Chief Complaint: Patient was seen in consultation today for right MCA stenosis.  Referring Physician(s): CODE STROKE- Aroor, Lanice Schwab  Supervising Physician: Luanne Bras  Patient Status: [facility] - In-pt  History of Present Illness: Heather [name] is a 63 y.o. female with a past medical history of hypertension, hyperlipidemia, and CVA 12/2017. She is known to [facility] and has been followed by Dr. Estanislado Pandy since 12/2017. She first presented to our department as an active code stroke with symptoms of right-sided headache, left arm weakness, and left facial droop. She underwent an emergent image-guided cerebral arteriogram with mechanical thrombectomy and rescue stenting of right MCA occlusion achieving a TICI 3 revascularization 12/18/2017 by Dr. Estanislado Pandy. She was discharged to inpatient rehab 12/19/2017 and discharged home 12/28/2017. Her stent has been monitored by routine imaging scans and image-guided diagnostic cerebral arteriograms. She was admitted 11/18/2018 due to TIAs.  Diagnostic cerebral arteriogram 03/06/2018: 1. Interval development of 50% intra stent stenosis of the right middle cerebral artery proximally. However, good antegrade flow noted in the right middle cerebral artery distribution.  MRI/MRA brain/head 11/01/2018: 1. Chronic hemorrhagic right MCA territory infarct. No acute infarct. Mild chronic microvascular ischemic changes. 2. Right MCA stent causing artifact. There is distal flow in the right MCA branches. 3. Mild atherosclerotic irregularity in the posterior cerebral arteries bilaterally. 4. Patient not able to complete all sequences due to claustrophobia.  Patient presents today for possible image-guided diagnostic cerebral arteriogram to evaluate right MCA stent placement. Patient awake and alert laying in bed with no complaints at this time. Accompanied by interpretor who was used to obtain consent. Denies fever, chills, chest pain, dyspnea, abdominal pain, dizziness,  or headache.  Patient is currently taking Brilinta 90 mg twice daily and Aspirin 81 mg once daily, and receiving Lovenox 40 mg SQ injections.   Past Medical History:  Diagnosis Date  . Hypertension   . Stroke [facility])     Past Surgical History:  Procedure Laterality Date  . BREAST EXCISIONAL BIOPSY    . BREAST SURGERY     right breast  . CHOLECYSTECTOMY    . IR ANGIO INTRA EXTRACRAN SEL COM CAROTID INNOMINATE BILAT MOD SED  03/06/2018  . IR ANGIO VERTEBRAL SEL VERTEBRAL BILAT MOD SED  03/06/2018  . IR CT HEAD LTD  12/18/2017  . IR INTRA CRAN STENT  12/18/2017  . IR PERCUTANEOUS ART THROMBECTOMY/INFUSION INTRACRANIAL INC DIAG ANGIO  12/18/2017  . IR RADIOLOGIST EVAL & MGMT  01/31/2018  . RADIOLOGY WITH ANESTHESIA N/A 12/18/2017   Procedure: RADIOLOGY WITH ANESTHESIA;  Surgeon: Luanne Bras, MD;  Location: [place];  Service: Radiology;  Laterality: N/A;    Allergies: Patient has no known allergies.  Medications: Prior to Admission medications   Medication Sig Start Date End Date Taking? Authorizing Provider  acetaminophen (TYLENOL) 500 MG tablet Take 1,000 mg by mouth every 8 (eight) hours as needed for moderate pain or headache.   Yes [provider]  aspirin 81 MG chewable tablet Chew 1 tablet (81 mg total) by mouth daily. 12/27/17  Yes Angiulli, Lavon Paganini, PA-C  glimepiride (AMARYL) 1 MG tablet TAKE 1 TABLET BY MOUTH DAILY WITH BREAKFAST. Patient taking differently: Take 1 mg by mouth daily with breakfast.  10/23/18  Yes Ladell Pier, MD  polyethylene glycol powder (GLYCOLAX/MIRALAX) powder Take 17 g by mouth daily as needed. Patient taking differently: Take 17 g by mouth daily as needed for moderate constipation.  08/09/18  Yes Ladell Pier, MD  ticagrelor [facility]) 90  MG TABS tablet Take 1 tablet (90 mg total) by mouth 2 (two) times daily. 06/26/18  Yes Ladell Pier, MD  Blood Glucose Monitoring Suppl (TRUE METRIX METER) DEVI 1 kit by Does not apply route 4  (four) times daily. 01/12/18   Brayton Caves, PA-C  glucose blood (TRUE METRIX BLOOD GLUCOSE TEST) test strip Use as instructed 01/12/18   Brayton Caves, PA-C  sertraline (ZOLOFT) 50 MG tablet Take 1 tablet (50 mg total) by mouth daily. Patient not taking: Reported on 11/13/2018 10/23/18   Ladell Pier, MD  TRUEPLUS LANCETS 28G MISC 28 g by Does not apply route QID. 01/12/18   Brayton Caves, PA-C     Family History  Problem Relation Age of Onset  . Diabetes Mother   . Pulmonary disease Mother   . Heart disease Father   . Stroke Sister     Social History   Socioeconomic History  . Marital status: Single    Spouse name: Not on file  . Number of children: Not on file  . Years of education: Not on file  . Highest education level: Not on file  Occupational History  . Not on file  Social Needs  . Financial resource strain: Not on file  . Food insecurity:    Worry: Not on file    Inability: Not on file  . Transportation needs:    Medical: Not on file    Non-medical: Not on file  Tobacco Use  . Smoking status: Former Smoker    Packs/day: 0.50    Years: 30.00    Pack years: 15.00    Types: Cigarettes    Last attempt to quit: 12/04/2017    Years since quitting: 0.9  . Smokeless tobacco: Never Used  Substance and Sexual Activity  . Alcohol use: Not Currently    Comment: weekly  . Drug use: No  . Sexual activity: Not Currently    Birth control/protection: None  Lifestyle  . Physical activity:    Days per week: Not on file    Minutes per session: Not on file  . Stress: Not on file  Relationships  . Social connections:    Talks on phone: Not on file    Gets together: Not on file    Attends religious service: Not on file    Active member of club or organization: Not on file    Attends meetings of clubs or organizations: Not on file    Relationship status: Not on file  Other Topics Concern  . Not on file  Social History Narrative  . Not on file     Review of  Systems: A 12 point ROS discussed and pertinent positives are indicated in the HPI above.  All other systems are negative.  Review of Systems  Constitutional: Negative for chills and fever.  Respiratory: Negative for shortness of breath and wheezing.   Cardiovascular: Negative for chest pain and palpitations.  Gastrointestinal: Negative for abdominal pain.  Neurological: Negative for dizziness and headaches.  Psychiatric/Behavioral: Negative for behavioral problems and confusion.     Physical Exam Vitals signs and nursing note reviewed.  Constitutional:      General: She is not in acute distress.    Appearance: Normal appearance.  Cardiovascular:     Rate and Rhythm: Normal rate and regular rhythm.     Heart sounds: Normal heart sounds. No murmur.  Pulmonary:     Effort: Pulmonary effort is normal. No respiratory distress.  Breath sounds: Normal breath sounds. No wheezing.  Skin:    General: Skin is warm and dry.  Neurological:     Mental Status: She is alert and oriented to person, place, and time.  Psychiatric:        Mood and Affect: Mood normal.        Behavior: Behavior normal.        Thought Content: Thought content normal.        Judgment: Judgment normal.      MD Evaluation Airway: WNL Heart: WNL Abdomen: WNL Chest/ Lungs: WNL ASA  Classification: 3 Mallampati/Airway Score: Two   Imaging: Ct Angio Head W Or Wo Contrast  Result Date: 11/18/2018 CLINICAL DATA:  Right facial droop. History of right MCA infarct and stenting. EXAM: CT ANGIOGRAPHY HEAD AND NECK TECHNIQUE: Multidetector CT imaging of the head and neck was performed using the standard protocol during bolus administration of intravenous contrast. Multiplanar CT image reconstructions and MIPs were obtained to evaluate the vascular anatomy. Carotid stenosis measurements (when applicable) are obtained utilizing NASCET criteria, using the distal internal carotid diameter as the denominator. CONTRAST:   44m ISOVUE-370 IOPAMIDOL (ISOVUE-370) INJECTION 76% COMPARISON:  Head MRI/MRA 11/01/2018.  Head and neck CTA 12/17/2017. FINDINGS: CT HEAD FINDINGS Brain: There is no evidence of acute infarct, intracranial hemorrhage, mass, midline shift, or extra-axial fluid collection. A large, chronic right MCA infarct is again noted. Hypodensities elsewhere in the cerebral white matter bilaterally are nonspecific but compatible with mild chronic small vessel ischemic disease. The ventricles are normal in size aside from minimal ex vacuo dilatation of the right lateral ventricle. Vascular: As below. Skull: No fracture or focal osseous lesion. Sinuses: Minimal mucosal thickening in the maxillary sinuses. Clear mastoid air cells. Orbits: Unremarkable. Review of the MIP images confirms the above findings CTA NECK FINDINGS Aortic arch: Standard 3 vessel aortic arch with nonstenotic plaque at the left subclavian artery origin. Right carotid system: Patent without evidence of stenosis or dissection. Tortuous cervical ICA. Left carotid system: Patent without evidence of stenosis or dissection. Vertebral arteries: Patent without evidence of stenosis or dissection. Moderately to strongly dominant left vertebral artery. Skeleton: Mild cervical spondylosis. Other neck: No evidence of acute abnormality or mass. Upper chest: Clear lung apices. Review of the MIP images confirms the above findings CTA HEAD FINDINGS Anterior circulation: The internal carotid arteries are widely patent from skull base to carotid termini. A right MCA stent extending from the M1 to M2 segments is patent with a proximally 50% stenosis in its proximal to midportion. The left MCA and both ACAs are patent with mild-to-moderate branch vessel irregular narrowing but no evidence of proximal branch occlusion or significant A1 or M1 stenosis. The left A1 segment is extremely hypoplastic. No aneurysm is identified. Posterior circulation: The intracranial vertebral arteries  are widely patent to the basilar. Patent left PICA, bilateral AICA, and bilateral SCA origins are identified. The basilar artery is widely patent. There are small right and medium-sized left posterior communicating arteries. The PCAs are patent, however there are severe mid to distal left P2 stenoses which are new from the 2019 CTA and progressive from the recent MRA. No aneurysm is identified. Venous sinuses: Patent. Anatomic variants: Hypoplastic left A1. Delayed phase: No abnormal enhancement. Review of the MIP images confirms the above findings IMPRESSION: 1. Chronic right MCA infarct. No definite acute infarct or hemorrhage. 2. 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 3. Patent right MCA stent with 50% intra-stent stenosis. 4. Widely patent  cervical carotid and vertebral arteries. Electronically Signed   By: Logan Bores M.D.   On: 11/18/2018 16:06   Ct Angio Neck W Or Wo Contrast  Result Date: 11/18/2018 CLINICAL DATA:  Right facial droop. History of right MCA infarct and stenting. EXAM: CT ANGIOGRAPHY HEAD AND NECK TECHNIQUE: Multidetector CT imaging of the head and neck was performed using the standard protocol during bolus administration of intravenous contrast. Multiplanar CT image reconstructions and MIPs were obtained to evaluate the vascular anatomy. Carotid stenosis measurements (when applicable) are obtained utilizing NASCET criteria, using the distal internal carotid diameter as the denominator. CONTRAST:  16m ISOVUE-370 IOPAMIDOL (ISOVUE-370) INJECTION 76% COMPARISON:  Head MRI/MRA 11/01/2018.  Head and neck CTA 12/17/2017. FINDINGS: CT HEAD FINDINGS Brain: There is no evidence of acute infarct, intracranial hemorrhage, mass, midline shift, or extra-axial fluid collection. A large, chronic right MCA infarct is again noted. Hypodensities elsewhere in the cerebral white matter bilaterally are nonspecific but compatible with mild chronic small vessel ischemic disease. The ventricles are  normal in size aside from minimal ex vacuo dilatation of the right lateral ventricle. Vascular: As below. Skull: No fracture or focal osseous lesion. Sinuses: Minimal mucosal thickening in the maxillary sinuses. Clear mastoid air cells. Orbits: Unremarkable. Review of the MIP images confirms the above findings CTA NECK FINDINGS Aortic arch: Standard 3 vessel aortic arch with nonstenotic plaque at the left subclavian artery origin. Right carotid system: Patent without evidence of stenosis or dissection. Tortuous cervical ICA. Left carotid system: Patent without evidence of stenosis or dissection. Vertebral arteries: Patent without evidence of stenosis or dissection. Moderately to strongly dominant left vertebral artery. Skeleton: Mild cervical spondylosis. Other neck: No evidence of acute abnormality or mass. Upper chest: Clear lung apices. Review of the MIP images confirms the above findings CTA HEAD FINDINGS Anterior circulation: The internal carotid arteries are widely patent from skull base to carotid termini. A right MCA stent extending from the M1 to M2 segments is patent with a proximally 50% stenosis in its proximal to midportion. The left MCA and both ACAs are patent with mild-to-moderate branch vessel irregular narrowing but no evidence of proximal branch occlusion or significant A1 or M1 stenosis. The left A1 segment is extremely hypoplastic. No aneurysm is identified. Posterior circulation: The intracranial vertebral arteries are widely patent to the basilar. Patent left PICA, bilateral AICA, and bilateral SCA origins are identified. The basilar artery is widely patent. There are small right and medium-sized left posterior communicating arteries. The PCAs are patent, however there are severe mid to distal left P2 stenoses which are new from the 2019 CTA and progressive from the recent MRA. No aneurysm is identified. Venous sinuses: Patent. Anatomic variants: Hypoplastic left A1. Delayed phase: No abnormal  enhancement. Review of the MIP images confirms the above findings IMPRESSION: 1. Chronic right MCA infarct. No definite acute infarct or hemorrhage. 2. 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 3. Patent right MCA stent with 50% intra-stent stenosis. 4. Widely patent cervical carotid and vertebral arteries. Electronically Signed   By: ALogan BoresM.D.   On: 11/18/2018 16:06   Mr MJodene NamHead Wo Contrast  Result Date: 11/02/2018 CLINICAL DATA:  CVA. EXAM: MRI HEAD WITHOUT CONTRAST MRA HEAD WITHOUT CONTRAST TECHNIQUE: Multiplanar, multiecho pulse sequences of the brain and surrounding structures were obtained without intravenous contrast. Angiographic images of the head were obtained using MRA technique without contrast. COMPARISON:  MRI head 12/19/2017 FINDINGS: MRI HEAD FINDINGS Brain: Chronic hemorrhagic infarct right MCA territory.  This involves the insula and operculum. Relative sparing of the temporal lobe and basal ganglia. Negative for acute infarct. Negative for hydrocephalus. Minimal chronic ischemic changes in the white matter. Exam was limited due to claustrophobia. Vascular: Right MCA stent extending into the sylvian fissure causing artifact. Normal flow voids at the base of the brain. Skull and upper cervical spine: Negative Sinuses/Orbits: Negative Other: None MRA HEAD FINDINGS Both vertebral arteries widely patent. Basilar widely patent. Left PICA normal. Right PICA not visualized however may be supplied by a large right AICA. Superior cerebellar and posterior cerebral arteries patent. Left posterior communicating artery patent. Mild stenosis and irregularity in the mid and distal posterior cerebral artery bilaterally. Internal carotid artery widely patent bilaterally. Both anterior cerebral arteries patent. Dominant right A1 segment. Hypoplastic left A1 segment. Left middle cerebral artery and branches widely patent Loss of flow related signal in the right M1 segment due to artifact from  stent. Cannot rule out stenosis in this area due to artifact. There is distal flow in the right MCA branches. IMPRESSION: Chronic hemorrhagic right MCA territory infarct. No acute infarct. Mild chronic microvascular ischemic changes. Right MCA stent causing artifact. There is distal flow in the right MCA branches. Mild atherosclerotic irregularity in the posterior cerebral arteries bilaterally. Patient not able to complete all sequences due to claustrophobia. Electronically Signed   By: Franchot Gallo M.D.   On: 11/02/2018 08:23   Mr Brain Wo Contrast  Result Date: 11/19/2018 CLINICAL DATA:  Initial evaluation for TIA, right head and facial twitching. EXAM: MRI HEAD WITHOUT CONTRAST MRA HEAD WITHOUT CONTRAST TECHNIQUE: Multiplanar, multiecho pulse sequences of the brain and surrounding structures were obtained without intravenous contrast. Angiographic images of the head were obtained using MRA technique without contrast. COMPARISON:  Prior CTA from earlier the same day as well as previous MRI from 11/01/2018. FINDINGS: MRI HEAD FINDINGS Brain: Cerebral volume within normal limits for age. Mild chronic microvascular ischemic changes present within the periventricular white matter and pons. Extensive encephalomalacia with gliosis present within the right frontal lobe, compatible with chronic right MCA territory infarct. Associated chronic hemosiderin staining with scattered laminar necrosis. Mild wallerian degeneration at the right cerebral peduncle. Appearance is stable from previous. No abnormal foci of restricted diffusion to suggest acute or subacute ischemia. Gray-white matter differentiation otherwise maintained. No evidence for acute intracranial hemorrhage. No mass lesion, midline shift or mass effect. No hydrocephalus. No extra-axial fluid collection. Incidental note made of a partially empty sella. Pituitary gland otherwise unremarkable. Midline structures intact. Vascular: Susceptibility artifact from  known right MCA stent. Normal vascular flow void seen distally within the right MCA branches. Remaining of the intracranial vascular flow voids maintained Skull and upper cervical spine: Craniocervical junction normal. Mild degenerative spondylolysis within the upper cervical spine without significant stenosis. No focal marrow replacing lesion. Scalp soft tissues unremarkable. Sinuses/Orbits: Globes and orbital soft tissues within normal limits. Mild scattered mucosal thickening within the ethmoidal air cells and maxillary sinuses. No air-fluid level to suggest acute sinusitis. Trace opacity right mastoid air cells, of doubtful significance. Inner ear structures grossly normal. Other: None. MRA HEAD FINDINGS ANTERIOR CIRCULATION: Examination mildly degraded by motion artifact. Distal cervical segments of the internal carotid arteries are patent with fairly symmetric antegrade flow. Distal cervical right ICA mildly tortuous. Petrous, cavernous, and supraclinoid segments patent without flow-limiting stenosis. Origin of the ophthalmic arteries patent. ICA termini well perfused. A1 segments grossly patent bilaterally. Left A1 hypoplastic. Anterior communicating artery grossly normal on this motion grade  exam. Anterior cerebral arteries patent to their distal aspects without appreciable stenosis. Susceptibility artifact from vascular stent at the right M1 segment extending into proximal M2 branch. Grossly patent flow through the stent, with flow related signal seen distally within the right MCA branches. Stent stenosis and patency better seen on prior CTA. Left M1 widely patent. Normal left MCA bifurcation. Distal left MCA branches well perfused. POSTERIOR CIRCULATION: Vertebral arteries patent to the vertebrobasilar junction without stenosis. Dominant left vertebral artery. Patent left PICA. Right PICA not seen. Basilar widely patent to its distal aspect without stenosis. Superior cerebral arteries patent bilaterally.  Right PCA largely supplied via the basilar, although a small right posterior communicating artery noted. Left PCA supplied via the basilar as well as a robust left posterior communicating artery. Moderate to advanced irregularity with stenoses involving the mid-distal left P2 segment, better seen on prior CTA. Multifocal atheromatous irregularity about the left P1 with mild to moderate stenoses. PCAs otherwise patent to their distal aspects. No intracranial aneurysm. IMPRESSION: MRI HEAD IMPRESSION: 1. No acute intracranial infarct or other abnormality. 2. Chronic hemorrhagic right MCA territory infarct. 3. Underlying mild chronic microvascular ischemic disease. MRA HEAD IMPRESSION: 1. Patent right MCA stent. Intra stent stenosis better evaluated on prior CTA from earlier same day. 2. Moderate multifocal left P1 stenoses, with moderate to advanced mid-distal left P2 stenoses, also seen on prior CTA. 3. Otherwise wide patency of the major arterial intracranial circulation. Electronically Signed   By: Jeannine Boga M.D.   On: 11/19/2018 00:44   Mr Brain Wo Contrast  Result Date: 11/02/2018 CLINICAL DATA:  CVA. EXAM: MRI HEAD WITHOUT CONTRAST MRA HEAD WITHOUT CONTRAST TECHNIQUE: Multiplanar, multiecho pulse sequences of the brain and surrounding structures were obtained without intravenous contrast. Angiographic images of the head were obtained using MRA technique without contrast. COMPARISON:  MRI head 12/19/2017 FINDINGS: MRI HEAD FINDINGS Brain: Chronic hemorrhagic infarct right MCA territory. This involves the insula and operculum. Relative sparing of the temporal lobe and basal ganglia. Negative for acute infarct. Negative for hydrocephalus. Minimal chronic ischemic changes in the white matter. Exam was limited due to claustrophobia. Vascular: Right MCA stent extending into the sylvian fissure causing artifact. Normal flow voids at the base of the brain. Skull and upper cervical spine: Negative  Sinuses/Orbits: Negative Other: None MRA HEAD FINDINGS Both vertebral arteries widely patent. Basilar widely patent. Left PICA normal. Right PICA not visualized however may be supplied by a large right AICA. Superior cerebellar and posterior cerebral arteries patent. Left posterior communicating artery patent. Mild stenosis and irregularity in the mid and distal posterior cerebral artery bilaterally. Internal carotid artery widely patent bilaterally. Both anterior cerebral arteries patent. Dominant right A1 segment. Hypoplastic left A1 segment. Left middle cerebral artery and branches widely patent Loss of flow related signal in the right M1 segment due to artifact from stent. Cannot rule out stenosis in this area due to artifact. There is distal flow in the right MCA branches. IMPRESSION: Chronic hemorrhagic right MCA territory infarct. No acute infarct. Mild chronic microvascular ischemic changes. Right MCA stent causing artifact. There is distal flow in the right MCA branches. Mild atherosclerotic irregularity in the posterior cerebral arteries bilaterally. Patient not able to complete all sequences due to claustrophobia. Electronically Signed   By: Franchot Gallo M.D.   On: 11/02/2018 08:23   Mr Jodene Nam Head Wo Contrast  Result Date: 11/19/2018 CLINICAL DATA:  Initial evaluation for TIA, right head and facial twitching. EXAM: MRI HEAD WITHOUT CONTRAST MRA  HEAD WITHOUT CONTRAST TECHNIQUE: Multiplanar, multiecho pulse sequences of the brain and surrounding structures were obtained without intravenous contrast. Angiographic images of the head were obtained using MRA technique without contrast. COMPARISON:  Prior CTA from earlier the same day as well as previous MRI from 11/01/2018. FINDINGS: MRI HEAD FINDINGS Brain: Cerebral volume within normal limits for age. Mild chronic microvascular ischemic changes present within the periventricular white matter and pons. Extensive encephalomalacia with gliosis present within  the right frontal lobe, compatible with chronic right MCA territory infarct. Associated chronic hemosiderin staining with scattered laminar necrosis. Mild wallerian degeneration at the right cerebral peduncle. Appearance is stable from previous. No abnormal foci of restricted diffusion to suggest acute or subacute ischemia. Gray-white matter differentiation otherwise maintained. No evidence for acute intracranial hemorrhage. No mass lesion, midline shift or mass effect. No hydrocephalus. No extra-axial fluid collection. Incidental note made of a partially empty sella. Pituitary gland otherwise unremarkable. Midline structures intact. Vascular: Susceptibility artifact from known right MCA stent. Normal vascular flow void seen distally within the right MCA branches. Remaining of the intracranial vascular flow voids maintained Skull and upper cervical spine: Craniocervical junction normal. Mild degenerative spondylolysis within the upper cervical spine without significant stenosis. No focal marrow replacing lesion. Scalp soft tissues unremarkable. Sinuses/Orbits: Globes and orbital soft tissues within normal limits. Mild scattered mucosal thickening within the ethmoidal air cells and maxillary sinuses. No air-fluid level to suggest acute sinusitis. Trace opacity right mastoid air cells, of doubtful significance. Inner ear structures grossly normal. Other: None. MRA HEAD FINDINGS ANTERIOR CIRCULATION: Examination mildly degraded by motion artifact. Distal cervical segments of the internal carotid arteries are patent with fairly symmetric antegrade flow. Distal cervical right ICA mildly tortuous. Petrous, cavernous, and supraclinoid segments patent without flow-limiting stenosis. Origin of the ophthalmic arteries patent. ICA termini well perfused. A1 segments grossly patent bilaterally. Left A1 hypoplastic. Anterior communicating artery grossly normal on this motion grade exam. Anterior cerebral arteries patent to their  distal aspects without appreciable stenosis. Susceptibility artifact from vascular stent at the right M1 segment extending into proximal M2 branch. Grossly patent flow through the stent, with flow related signal seen distally within the right MCA branches. Stent stenosis and patency better seen on prior CTA. Left M1 widely patent. Normal left MCA bifurcation. Distal left MCA branches well perfused. POSTERIOR CIRCULATION: Vertebral arteries patent to the vertebrobasilar junction without stenosis. Dominant left vertebral artery. Patent left PICA. Right PICA not seen. Basilar widely patent to its distal aspect without stenosis. Superior cerebral arteries patent bilaterally. Right PCA largely supplied via the basilar, although a small right posterior communicating artery noted. Left PCA supplied via the basilar as well as a robust left posterior communicating artery. Moderate to advanced irregularity with stenoses involving the mid-distal left P2 segment, better seen on prior CTA. Multifocal atheromatous irregularity about the left P1 with mild to moderate stenoses. PCAs otherwise patent to their distal aspects. No intracranial aneurysm. IMPRESSION: MRI HEAD IMPRESSION: 1. No acute intracranial infarct or other abnormality. 2. Chronic hemorrhagic right MCA territory infarct. 3. Underlying mild chronic microvascular ischemic disease. MRA HEAD IMPRESSION: 1. Patent right MCA stent. Intra stent stenosis better evaluated on prior CTA from earlier same day. 2. Moderate multifocal left P1 stenoses, with moderate to advanced mid-distal left P2 stenoses, also seen on prior CTA. 3. Otherwise wide patency of the major arterial intracranial circulation. Electronically Signed   By: Jeannine Boga M.D.   On: 11/19/2018 00:44    Labs:  CBC: Recent Labs  03/06/18 0630 10/12/18 1710 11/18/18 1422 11/20/18 0518  WBC 9.1 10.4 8.8 7.9  HGB 13.9 14.1 15.0 12.9  HCT 42.8 43.0 48.0* 41.0  PLT 328 305 262 259     COAGS: Recent Labs    12/17/17 2255 03/06/18 0630 11/18/18 1422 11/20/18 0518  INR 0.89 0.92 0.92 0.98  APTT 28  --  28 33    BMP: Recent Labs    03/06/18 0630 10/12/18 1710 11/18/18 1422 11/20/18 0518  NA 140 141 141 140  K 4.1 4.6 4.2 3.7  CL 108 105 110 111  CO2 25 18* 27 23  GLUCOSE 144* 107* 104* 95  BUN '11 16 12 11  ' CALCIUM 9.3 9.9 9.6 9.0  CREATININE 0.52 0.59 0.56 0.47  GFRNONAA >60 98 >60 >60  GFRAA >60 113 >60 >60    LIVER FUNCTION TESTS: Recent Labs    12/17/17 2255 12/20/17 0738 11/18/18 1422  BILITOT 0.3 0.8 0.7  AST '21 25 21  ' ALT '23 22 24  ' ALKPHOS 76 75 68  PROT 6.5 6.7 7.4  ALBUMIN 3.5 3.4* 4.1    Assessment and Plan:  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 Estanislado Pandy. Plan for image-guided diagnostic cerebral arteriogram this AM with Dr. Estanislado Pandy. Patient NPO. Afebrile and WBCs WNL. OK to proceed per Dr. Estanislado Pandy with Lovenox, Brilinta, and Aspirin use. INR 0.98 seconds today.  Risks and benefits of cerebral angiogram were discussed with the patient including, but not limited to bleeding, infection, vascular injury or contrast induced renal failure. This interventional procedure involves the use of X-rays and because of the nature of the planned procedure, it is possible that we will have prolonged use of X-ray fluoroscopy. Potential radiation risks to you include (but are not limited to) the following: - A slightly elevated risk for cancer  several years later in life. This risk is typically less than 0.5% percent. This risk is low in comparison to the normal incidence of human cancer, which is 33% for women and 50% for men according to the [place]. - Radiation induced injury can include skin redness, resembling a rash, tissue breakdown / ulcers and hair loss (which can be temporary or permanent).  The likelihood of either of these occurring depends on the difficulty of the procedure and whether you are  sensitive to radiation due to previous procedures, disease, or genetic conditions.  IF your procedure requires a prolonged use of radiation, you will be notified and given written instructions for further action.  It is your responsibility to monitor the irradiated area for the 2 weeks following the procedure and to notify your physician if you are concerned that you have suffered a radiation induced injury.   All of the patient's questions were answered, patient is agreeable to proceed. Consent signed and in chart.   Thank you for this interesting consult.  I greatly enjoyed meeting Prerana Strayer and look forward to participating in their care.  A copy of this report was sent to the requesting provider on this date.  Electronically Signed: Earley Abide, PA-C 11/20/2018, 8:49 AM   I spent a total of 40 Minutes in face to face in clinical consultation, greater than 50% of which was counseling/coordinating care for right MCA stenosis.

## 2018-11-20 NOTE — Sedation Documentation (Signed)
Pressure being held to right groin insertion site by IR tech

## 2018-11-20 NOTE — Sedation Documentation (Signed)
Pressure released, dressing applied to right groin site by IR Tech

## 2018-11-20 NOTE — Sedation Documentation (Signed)
Patient taken to room on 3W-01. Beside report given to Rodney Booze, Charity fundraiser.Dressing c/d/i. Level 0.

## 2018-11-20 NOTE — Progress Notes (Signed)
STROKE TEAM PROGRESS NOTE   INTERVAL HISTORY Patient is lying comfortably in bed. No family or interpreter is at the bedside. She has no complaints. 2-D echo is normal.  Vitals:   11/19/18 2306 11/20/18 0339 11/20/18 0720 11/20/18 1123  BP: 115/85 118/81 108/65 112/68  Pulse: 79 77 68 60  Resp: Temp: 98.2 F (36.8 C) 98 F (36.7 C) 97.7 F (36.5 C) 98 F (36.7 C)  TempSrc: Oral Oral Oral Oral  SpO2: 100% 100% 100% 100%  Weight:      Height:        CBC:  Recent Labs  Lab 11/18/18 1422 11/20/18 0518  WBC 8.8 7.9  NEUTROABS 6.1  --   HGB 15.0 12.9  HCT 48.0* 41.0  MCV 92.7 91.5  PLT 262 259    Basic Metabolic Panel:  Recent Labs  Lab 11/18/18 1422 11/20/18 0518  NA 141 140  K 4.2 3.7  CL 110 111  CO2 27 23  GLUCOSE 104* 95  BUN 12 11  CREATININE 0.56 0.47  CALCIUM 9.6 9.0   Lipid Panel:     Component Value Date/Time   CHOL 134 11/19/2018 0554   TRIG 69 11/19/2018 0554   HDL 33 (L) 11/19/2018 0554   CHOLHDL 4.1 11/19/2018 0554   VLDL 14 11/19/2018 0554   LDLCALC 87 11/19/2018 0554   HgbA1c:  Lab Results  Component Value Date   HGBA1C 5.4 11/19/2018   Urine Drug Screen:     Component Value Date/Time   LABOPIA NONE DETECTED 11/18/2018 1248   COCAINSCRNUR NONE DETECTED 11/18/2018 1248   LABBENZ NONE DETECTED 11/18/2018 1248   AMPHETMU NONE DETECTED 11/18/2018 1248   THCU NONE DETECTED 11/18/2018 1248   LABBARB NONE DETECTED 11/18/2018 1248    Alcohol Level     Component Value Date/Time   ETH <10 11/18/2018 1422    IMAGING Ct Angio Head W Or Wo Contrast  Result Date: 11/18/2018 CLINICAL DATA:  Right facial droop. History of right MCA infarct and stenting. EXAM: CT ANGIOGRAPHY HEAD AND NECK TECHNIQUE: Multidetector CT imaging of the head and neck was performed using the standard protocol during bolus administration of intravenous contrast. Multiplanar CT image reconstructions and MIPs were obtained to evaluate the vascular anatomy.  Carotid stenosis measurements (when applicable) are obtained utilizing NASCET criteria, using the distal internal carotid diameter as the denominator. CONTRAST:  50mL ISOVUE-370 IOPAMIDOL (ISOVUE-370) INJECTION 76% COMPARISON:  Head MRI/MRA 11/01/2018.  Head and neck CTA 12/17/2017. FINDINGS: CT HEAD FINDINGS Brain: There is no evidence of acute infarct, intracranial hemorrhage, mass, midline shift, or extra-axial fluid collection. A large, chronic right MCA infarct is again noted. Hypodensities elsewhere in the cerebral white matter bilaterally are nonspecific but compatible with mild chronic small vessel ischemic disease. The ventricles are normal in size aside from minimal ex vacuo dilatation of the right lateral ventricle. Vascular: As below. Skull: No fracture or focal osseous lesion. Sinuses: Minimal mucosal thickening in the maxillary sinuses. Clear mastoid air cells. Orbits: Unremarkable. Review of the MIP images confirms the above findings CTA NECK FINDINGS Aortic arch: Standard 3 vessel aortic arch with nonstenotic plaque at the left subclavian artery origin. Right carotid system: Patent without evidence of stenosis or dissection. Tortuous cervical ICA. Left carotid system: Patent without evidence of stenosis or dissection. Vertebral arteries: Patent without evidence of stenosis or dissection. Moderately to strongly dominant left vertebral artery. Skeleton: Mild cervical spondylosis. Other neck: No evidence of acute abnormality or mass.  Upper chest: Clear lung apices. Review of the MIP images confirms the above findings CTA HEAD FINDINGS Anterior circulation: The internal carotid arteries are widely patent from skull base to carotid termini. A right MCA stent extending from the M1 to M2 segments is patent with a proximally 50% stenosis in its proximal to midportion. The left MCA and both ACAs are patent with mild-to-moderate branch vessel irregular narrowing but no evidence of proximal branch occlusion or  significant A1 or M1 stenosis. The left A1 segment is extremely hypoplastic. No aneurysm is identified. Posterior circulation: The intracranial vertebral arteries are widely patent to the basilar. Patent left PICA, bilateral AICA, and bilateral SCA origins are identified. The basilar artery is widely patent. There are small right and medium-sized left posterior communicating arteries. The PCAs are patent, however there are severe mid to distal left P2 stenoses which are new from the 2019 CTA and progressive from the recent MRA. No aneurysm is identified. Venous sinuses: Patent. Anatomic variants: Hypoplastic left A1. Delayed phase: No abnormal enhancement. Review of the MIP images confirms the above findings IMPRESSION: 1. Chronic right MCA infarct. No definite acute infarct or hemorrhage. 2. 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 3. Patent right MCA stent with 50% intra-stent stenosis. 4. Widely patent cervical carotid and vertebral arteries. Electronically Signed   By: Sebastian Ache M.D.   On: 11/18/2018 16:06   Ct Angio Neck W Or Wo Contrast  Result Date: 11/18/2018 CLINICAL DATA:  Right facial droop. History of right MCA infarct and stenting. EXAM: CT ANGIOGRAPHY HEAD AND NECK TECHNIQUE: Multidetector CT imaging of the head and neck was performed using the standard protocol during bolus administration of intravenous contrast. Multiplanar CT image reconstructions and MIPs were obtained to evaluate the vascular anatomy. Carotid stenosis measurements (when applicable) are obtained utilizing NASCET criteria, using the distal internal carotid diameter as the denominator. CONTRAST:  68mL ISOVUE-370 IOPAMIDOL (ISOVUE-370) INJECTION 76% COMPARISON:  Head MRI/MRA 11/01/2018.  Head and neck CTA 12/17/2017. FINDINGS: CT HEAD FINDINGS Brain: There is no evidence of acute infarct, intracranial hemorrhage, mass, midline shift, or extra-axial fluid collection. A large, chronic right MCA infarct is again noted.  Hypodensities elsewhere in the cerebral white matter bilaterally are nonspecific but compatible with mild chronic small vessel ischemic disease. The ventricles are normal in size aside from minimal ex vacuo dilatation of the right lateral ventricle. Vascular: As below. Skull: No fracture or focal osseous lesion. Sinuses: Minimal mucosal thickening in the maxillary sinuses. Clear mastoid air cells. Orbits: Unremarkable. Review of the MIP images confirms the above findings CTA NECK FINDINGS Aortic arch: Standard 3 vessel aortic arch with nonstenotic plaque at the left subclavian artery origin. Right carotid system: Patent without evidence of stenosis or dissection. Tortuous cervical ICA. Left carotid system: Patent without evidence of stenosis or dissection. Vertebral arteries: Patent without evidence of stenosis or dissection. Moderately to strongly dominant left vertebral artery. Skeleton: Mild cervical spondylosis. Other neck: No evidence of acute abnormality or mass. Upper chest: Clear lung apices. Review of the MIP images confirms the above findings CTA HEAD FINDINGS Anterior circulation: The internal carotid arteries are widely patent from skull base to carotid termini. A right MCA stent extending from the M1 to M2 segments is patent with a proximally 50% stenosis in its proximal to midportion. The left MCA and both ACAs are patent with mild-to-moderate branch vessel irregular narrowing but no evidence of proximal branch occlusion or significant A1 or M1 stenosis. The left A1 segment is  extremely hypoplastic. No aneurysm is identified. Posterior circulation: The intracranial vertebral arteries are widely patent to the basilar. Patent left PICA, bilateral AICA, and bilateral SCA origins are identified. The basilar artery is widely patent. There are small right and medium-sized left posterior communicating arteries. The PCAs are patent, however there are severe mid to distal left P2 stenoses which are new from the  2019 CTA and progressive from the recent MRA. No aneurysm is identified. Venous sinuses: Patent. Anatomic variants: Hypoplastic left A1. Delayed phase: No abnormal enhancement. Review of the MIP images confirms the above findings IMPRESSION: 1. Chronic right MCA infarct. No definite acute infarct or hemorrhage. 2. 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 3. Patent right MCA stent with 50% intra-stent stenosis. 4. Widely patent cervical carotid and vertebral arteries. Electronically Signed   By: Sebastian AcheAllen  Grady M.D.   On: 11/18/2018 16:06   Mr Brain Wo Contrast  Result Date: 11/19/2018 CLINICAL DATA:  Initial evaluation for TIA, right head and facial twitching. EXAM: MRI HEAD WITHOUT CONTRAST MRA HEAD WITHOUT CONTRAST TECHNIQUE: Multiplanar, multiecho pulse sequences of the brain and surrounding structures were obtained without intravenous contrast. Angiographic images of the head were obtained using MRA technique without contrast. COMPARISON:  Prior CTA from earlier the same day as well as previous MRI from 11/01/2018. FINDINGS: MRI HEAD FINDINGS Brain: Cerebral volume within normal limits for age. Mild chronic microvascular ischemic changes present within the periventricular white matter and pons. Extensive encephalomalacia with gliosis present within the right frontal lobe, compatible with chronic right MCA territory infarct. Associated chronic hemosiderin staining with scattered laminar necrosis. Mild wallerian degeneration at the right cerebral peduncle. Appearance is stable from previous. No abnormal foci of restricted diffusion to suggest acute or subacute ischemia. Gray-white matter differentiation otherwise maintained. No evidence for acute intracranial hemorrhage. No mass lesion, midline shift or mass effect. No hydrocephalus. No extra-axial fluid collection. Incidental note made of a partially empty sella. Pituitary gland otherwise unremarkable. Midline structures intact. Vascular:  Susceptibility artifact from known right MCA stent. Normal vascular flow void seen distally within the right MCA branches. Remaining of the intracranial vascular flow voids maintained Skull and upper cervical spine: Craniocervical junction normal. Mild degenerative spondylolysis within the upper cervical spine without significant stenosis. No focal marrow replacing lesion. Scalp soft tissues unremarkable. Sinuses/Orbits: Globes and orbital soft tissues within normal limits. Mild scattered mucosal thickening within the ethmoidal air cells and maxillary sinuses. No air-fluid level to suggest acute sinusitis. Trace opacity right mastoid air cells, of doubtful significance. Inner ear structures grossly normal. Other: None. MRA HEAD FINDINGS ANTERIOR CIRCULATION: Examination mildly degraded by motion artifact. Distal cervical segments of the internal carotid arteries are patent with fairly symmetric antegrade flow. Distal cervical right ICA mildly tortuous. Petrous, cavernous, and supraclinoid segments patent without flow-limiting stenosis. Origin of the ophthalmic arteries patent. ICA termini well perfused. A1 segments grossly patent bilaterally. Left A1 hypoplastic. Anterior communicating artery grossly normal on this motion grade exam. Anterior cerebral arteries patent to their distal aspects without appreciable stenosis. Susceptibility artifact from vascular stent at the right M1 segment extending into proximal M2 branch. Grossly patent flow through the stent, with flow related signal seen distally within the right MCA branches. Stent stenosis and patency better seen on prior CTA. Left M1 widely patent. Normal left MCA bifurcation. Distal left MCA branches well perfused. POSTERIOR CIRCULATION: Vertebral arteries patent to the vertebrobasilar junction without stenosis. Dominant left vertebral artery. Patent left PICA. Right PICA not seen. Basilar widely  patent to its distal aspect without stenosis. Superior cerebral  arteries patent bilaterally. Right PCA largely supplied via the basilar, although a small right posterior communicating artery noted. Left PCA supplied via the basilar as well as a robust left posterior communicating artery. Moderate to advanced irregularity with stenoses involving the mid-distal left P2 segment, better seen on prior CTA. Multifocal atheromatous irregularity about the left P1 with mild to moderate stenoses. PCAs otherwise patent to their distal aspects. No intracranial aneurysm. IMPRESSION: MRI HEAD IMPRESSION: 1. No acute intracranial infarct or other abnormality. 2. Chronic hemorrhagic right MCA territory infarct. 3. Underlying mild chronic microvascular ischemic disease. MRA HEAD IMPRESSION: 1. Patent right MCA stent. Intra stent stenosis better evaluated on prior CTA from earlier same day. 2. Moderate multifocal left P1 stenoses, with moderate to advanced mid-distal left P2 stenoses, also seen on prior CTA. 3. Otherwise wide patency of the major arterial intracranial circulation. Electronically Signed   By: Rise Mu M.D.   On: 11/19/2018 00:44   Mr Maxine Glenn Head Wo Contrast  Result Date: 11/19/2018 CLINICAL DATA:  Initial evaluation for TIA, right head and facial twitching. EXAM: MRI HEAD WITHOUT CONTRAST MRA HEAD WITHOUT CONTRAST TECHNIQUE: Multiplanar, multiecho pulse sequences of the brain and surrounding structures were obtained without intravenous contrast. Angiographic images of the head were obtained using MRA technique without contrast. COMPARISON:  Prior CTA from earlier the same day as well as previous MRI from 11/01/2018. FINDINGS: MRI HEAD FINDINGS Brain: Cerebral volume within normal limits for age. Mild chronic microvascular ischemic changes present within the periventricular white matter and pons. Extensive encephalomalacia with gliosis present within the right frontal lobe, compatible with chronic right MCA territory infarct. Associated chronic hemosiderin staining  with scattered laminar necrosis. Mild wallerian degeneration at the right cerebral peduncle. Appearance is stable from previous. No abnormal foci of restricted diffusion to suggest acute or subacute ischemia. Gray-white matter differentiation otherwise maintained. No evidence for acute intracranial hemorrhage. No mass lesion, midline shift or mass effect. No hydrocephalus. No extra-axial fluid collection. Incidental note made of a partially empty sella. Pituitary gland otherwise unremarkable. Midline structures intact. Vascular: Susceptibility artifact from known right MCA stent. Normal vascular flow void seen distally within the right MCA branches. Remaining of the intracranial vascular flow voids maintained Skull and upper cervical spine: Craniocervical junction normal. Mild degenerative spondylolysis within the upper cervical spine without significant stenosis. No focal marrow replacing lesion. Scalp soft tissues unremarkable. Sinuses/Orbits: Globes and orbital soft tissues within normal limits. Mild scattered mucosal thickening within the ethmoidal air cells and maxillary sinuses. No air-fluid level to suggest acute sinusitis. Trace opacity right mastoid air cells, of doubtful significance. Inner ear structures grossly normal. Other: None. MRA HEAD FINDINGS ANTERIOR CIRCULATION: Examination mildly degraded by motion artifact. Distal cervical segments of the internal carotid arteries are patent with fairly symmetric antegrade flow. Distal cervical right ICA mildly tortuous. Petrous, cavernous, and supraclinoid segments patent without flow-limiting stenosis. Origin of the ophthalmic arteries patent. ICA termini well perfused. A1 segments grossly patent bilaterally. Left A1 hypoplastic. Anterior communicating artery grossly normal on this motion grade exam. Anterior cerebral arteries patent to their distal aspects without appreciable stenosis. Susceptibility artifact from vascular stent at the right M1 segment  extending into proximal M2 branch. Grossly patent flow through the stent, with flow related signal seen distally within the right MCA branches. Stent stenosis and patency better seen on prior CTA. Left M1 widely patent. Normal left MCA bifurcation. Distal left MCA branches well perfused. POSTERIOR CIRCULATION:  Vertebral arteries patent to the vertebrobasilar junction without stenosis. Dominant left vertebral artery. Patent left PICA. Right PICA not seen. Basilar widely patent to its distal aspect without stenosis. Superior cerebral arteries patent bilaterally. Right PCA largely supplied via the basilar, although a small right posterior communicating artery noted. Left PCA supplied via the basilar as well as a robust left posterior communicating artery. Moderate to advanced irregularity with stenoses involving the mid-distal left P2 segment, better seen on prior CTA. Multifocal atheromatous irregularity about the left P1 with mild to moderate stenoses. PCAs otherwise patent to their distal aspects. No intracranial aneurysm. IMPRESSION: MRI HEAD IMPRESSION: 1. No acute intracranial infarct or other abnormality. 2. Chronic hemorrhagic right MCA territory infarct. 3. Underlying mild chronic microvascular ischemic disease. MRA HEAD IMPRESSION: 1. Patent right MCA stent. Intra stent stenosis better evaluated on prior CTA from earlier same day. 2. Moderate multifocal left P1 stenoses, with moderate to advanced mid-distal left P2 stenoses, also seen on prior CTA. 3. Otherwise wide patency of the major arterial intracranial circulation. Electronically Signed   By: Rise Mu M.D.   On: 11/19/2018 00:44    PHYSICAL EXAM Pleasant middle-age Hispanic lady not in distress. . Afebrile. Head is nontraumatic. Neck is supple without bruit.    Cardiac exam no murmur or gallop. Lungs are clear to auscultation. Distal pulses are well felt.   Neurological Exam ; daughter interprets for her for this exam Awake  Alert  oriented x 3. Normal speech and language.eye movements full without nystagmus.fundi were not visualized. Vision acuity and fields appear normal. Hearing is normal. Palatal movements are normal. Face symmetric. Tongue midline. Normal strength, tone, reflexes and coordination. Normal sensation. Gait deferred.   ASSESSMENT/PLAN [name] is a 63 y.o. female with history of stroke w/ stent and HTN presenting with R facial and R head twitching x 10 min then resolved.   Face twitch w/ Anxiety  CTA head & neck chronic R MCA infarct. No new infarct. progressive severe L P2 stenosis. Patent R MCA stent s/ 50% intra-stent stenosis.   MRI  No acute stroke. Old R MCA infarct. Small vessel disease.   MRA  Patent R MCA stent. Mod multifocial L P1 stenosis, mod to adv L P2 stenosis.  2D Echo  pending   EEG normal, no sz  LDL 87  HgbA1c 5.5  Lovenox 40 mg sq daily for VTE prophylaxis  aspirin 81 mg daily and Brilinta 90 bid prior to admission, now on aspirin 81 mg daily and Brilinta 90 bid. Continue at d/c.   Therapy recommendations:  No PT  Disposition:  pending   Ok for d/c from stroke standpoint once 2D resulted  Hypertension  Stable . 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 Testing at bedside accurate but slow. (recall 3/3 after 4 mins, clock draw ok)  Speaks spanish - no  [facility] day # 0  She presented with transient episode of face drawing and twitching lasting only 10 minutes. There is no definite focal neurological deficits. MRI is negative for acute stroke. Etiology of this episode appears unclear. Recommend check EEG. Cerebral angiogramshows less than 50% in-stent restenosis. Patient may be discharged   Follow-up with Dr. Corliss Skainseveshwar is sent outpatient for stent follow-up.  Delia HeadyPramod Sharayah Renfrow, MD Medical Director [facility] Pager: [phone] 11/20/2018 1:27 PM  To contact Stroke Continuity provider, please refer to [email or web address]. After hours, contact General Neurology

## 2018-11-20 NOTE — Progress Notes (Signed)
Patient returned from IR Alert and oriented X 4  C/o right leg pain, Nurse will treat with PRN medications R groin incision, Dressing in place, Clean dry and intact, right +1 pedal pulses Patient educated about keeping the leg straight. All questions and concerns addressed, Bed in the lowest position with alarm set. Call light in reach. Nurse will continue to monitor.

## 2018-11-20 NOTE — Discharge Summary (Signed)
Physician Discharge Summary  Heather [name] GYI:[id number] DOB: [birth year] DOA: 11/18/2018  PCP: Ladell Pier, MD  Admit date: 11/18/2018 Discharge date: 11/20/2018  Admitted From: Home Disposition:  Home  Recommendations for Outpatient Follow-up:  1. Follow up with PCP in 1-2 weeks 2. Follow-up with IR in 6 months.   Home Health:No Equipment/Devices:none  Discharge Condition:stable CODE STATUS:Full Diet recommendation: Heart Healthy  Brief/Interim Summary: 63 y.o. 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  Discharge Diagnoses:  Active Problems:   Left hemiparesis ([place])   Diabetes mellitus type 2 in obese [facility])   Essential hypertension   TIA (transient ischemic attack)  TIA (transient ischemic attack): HDL less than 40 LDL 87, she is currently on  statins. 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 CTA of the head showed a right MCA stent 50% stenosed PT eval is recommended home health PT. Continue aspirin and Brilinta. EEG showed no focal deficits. Neurology was consulted they recommended no further evaluation. Interventional was consulted and perform an angiogram as she dysp scheduled and they recommended to follow-up with them in 6 months.  History of left hemiparesis (HCC) Continue aspirin and Brilinta with a persistent facial droop.  Diabetes mellitus type 2 in obese (HCC) No changes made to her medication.  Essential hypertension Resume medications as an outpatient.    Discharge Instructions  Discharge Instructions    Diet - low sodium heart healthy   Complete by:  As  directed    Increase activity slowly   Complete by:  As directed      Allergies as of 11/20/2018   No Known Allergies     Medication List    TAKE these medications   acetaminophen 500 MG tablet Commonly known as:  TYLENOL Take 1,000 mg by mouth every 8 (eight) hours as needed for moderate pain or headache.   aspirin 81 MG chewable tablet Chew 1 tablet (81 mg total) by mouth daily.   atorvastatin 10 MG tablet Commonly known as:  LIPITOR Take 1 tablet (10 mg total) by mouth daily at 6 PM.   glimepiride 1 MG tablet Commonly known as:  AMARYL TAKE 1 TABLET BY MOUTH DAILY WITH BREAKFAST.   glucose blood test strip Commonly known as:  TRUE METRIX BLOOD GLUCOSE TEST Use as instructed   polyethylene glycol powder powder Commonly known as:  GLYCOLAX/MIRALAX Take 17 g by mouth daily as needed. What changed:  reasons to take this   sertraline 50 MG tablet Commonly known as:  ZOLOFT Take 1 tablet (50 mg total) by mouth daily.   ticagrelor 90 MG Tabs tablet Commonly known as:  BRILINTA Take 1 tablet (90 mg total) by mouth 2 (two) times daily.   TRUE METRIX METER Devi 1 kit by Does not apply route 4 (four) times daily.   TRUEPLUS LANCETS 28G Misc 28 g by Does not apply route QID.       No Known Allergies  Consultations:  neurology   Procedures/Studies: Ct Angio Head W Or Wo Contrast  Result Date: 11/18/2018 CLINICAL DATA:  Right facial droop. History of right MCA infarct and stenting. EXAM: CT ANGIOGRAPHY HEAD AND  NECK TECHNIQUE: Multidetector CT imaging of the head and neck was performed using the standard protocol during bolus administration of intravenous contrast. Multiplanar CT image reconstructions and MIPs were obtained to evaluate the vascular anatomy. Carotid stenosis measurements (when applicable) are obtained utilizing NASCET criteria, using the distal internal carotid diameter as the denominator. CONTRAST:  47m ISOVUE-370 IOPAMIDOL (ISOVUE-370) INJECTION  76% COMPARISON:  Head MRI/MRA 11/01/2018.  Head and neck CTA 12/17/2017. FINDINGS: CT HEAD FINDINGS Brain: There is no evidence of acute infarct, intracranial hemorrhage, mass, midline shift, or extra-axial fluid collection. A large, chronic right MCA infarct is again noted. Hypodensities elsewhere in the cerebral white matter bilaterally are nonspecific but compatible with mild chronic small vessel ischemic disease. The ventricles are normal in size aside from minimal ex vacuo dilatation of the right lateral ventricle. Vascular: As below. Skull: No fracture or focal osseous lesion. Sinuses: Minimal mucosal thickening in the maxillary sinuses. Clear mastoid air cells. Orbits: Unremarkable. Review of the MIP images confirms the above findings CTA NECK FINDINGS Aortic arch: Standard 3 vessel aortic arch with nonstenotic plaque at the left subclavian artery origin. Right carotid system: Patent without evidence of stenosis or dissection. Tortuous cervical ICA. Left carotid system: Patent without evidence of stenosis or dissection. Vertebral arteries: Patent without evidence of stenosis or dissection. Moderately to strongly dominant left vertebral artery. Skeleton: Mild cervical spondylosis. Other neck: No evidence of acute abnormality or mass. Upper chest: Clear lung apices. Review of the MIP images confirms the above findings CTA HEAD FINDINGS Anterior circulation: The internal carotid arteries are widely patent from skull base to carotid termini. A right MCA stent extending from the M1 to M2 segments is patent with a proximally 50% stenosis in its proximal to midportion. The left MCA and both ACAs are patent with mild-to-moderate branch vessel irregular narrowing but no evidence of proximal branch occlusion or significant A1 or M1 stenosis. The left A1 segment is extremely hypoplastic. No aneurysm is identified. Posterior circulation: The intracranial vertebral arteries are widely patent to the basilar. Patent left  PICA, bilateral AICA, and bilateral SCA origins are identified. The basilar artery is widely patent. There are small right and medium-sized left posterior communicating arteries. The PCAs are patent, however there are severe mid to distal left P2 stenoses which are new from the 2019 CTA and progressive from the recent MRA. No aneurysm is identified. Venous sinuses: Patent. Anatomic variants: Hypoplastic left A1. Delayed phase: No abnormal enhancement. Review of the MIP images confirms the above findings IMPRESSION: 1. Chronic right MCA infarct. No definite acute infarct or hemorrhage. 2. 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 3. Patent right MCA stent with 50% intra-stent stenosis. 4. Widely patent cervical carotid and vertebral arteries. Electronically Signed   By: ALogan BoresM.D.   On: 11/18/2018 16:06   Ct Angio Neck W Or Wo Contrast  Result Date: 11/18/2018 CLINICAL DATA:  Right facial droop. History of right MCA infarct and stenting. EXAM: CT ANGIOGRAPHY HEAD AND NECK TECHNIQUE: Multidetector CT imaging of the head and neck was performed using the standard protocol during bolus administration of intravenous contrast. Multiplanar CT image reconstructions and MIPs were obtained to evaluate the vascular anatomy. Carotid stenosis measurements (when applicable) are obtained utilizing NASCET criteria, using the distal internal carotid diameter as the denominator. CONTRAST:  564mISOVUE-370 IOPAMIDOL (ISOVUE-370) INJECTION 76% COMPARISON:  Head MRI/MRA 11/01/2018.  Head and neck CTA 12/17/2017. FINDINGS: CT HEAD FINDINGS Brain: There is no evidence of acute infarct, intracranial hemorrhage,  mass, midline shift, or extra-axial fluid collection. A large, chronic right MCA infarct is again noted. Hypodensities elsewhere in the cerebral white matter bilaterally are nonspecific but compatible with mild chronic small vessel ischemic disease. The ventricles are normal in size aside from minimal ex vacuo  dilatation of the right lateral ventricle. Vascular: As below. Skull: No fracture or focal osseous lesion. Sinuses: Minimal mucosal thickening in the maxillary sinuses. Clear mastoid air cells. Orbits: Unremarkable. Review of the MIP images confirms the above findings CTA NECK FINDINGS Aortic arch: Standard 3 vessel aortic arch with nonstenotic plaque at the left subclavian artery origin. Right carotid system: Patent without evidence of stenosis or dissection. Tortuous cervical ICA. Left carotid system: Patent without evidence of stenosis or dissection. Vertebral arteries: Patent without evidence of stenosis or dissection. Moderately to strongly dominant left vertebral artery. Skeleton: Mild cervical spondylosis. Other neck: No evidence of acute abnormality or mass. Upper chest: Clear lung apices. Review of the MIP images confirms the above findings CTA HEAD FINDINGS Anterior circulation: The internal carotid arteries are widely patent from skull base to carotid termini. A right MCA stent extending from the M1 to M2 segments is patent with a proximally 50% stenosis in its proximal to midportion. The left MCA and both ACAs are patent with mild-to-moderate branch vessel irregular narrowing but no evidence of proximal branch occlusion or significant A1 or M1 stenosis. The left A1 segment is extremely hypoplastic. No aneurysm is identified. Posterior circulation: The intracranial vertebral arteries are widely patent to the basilar. Patent left PICA, bilateral AICA, and bilateral SCA origins are identified. The basilar artery is widely patent. There are small right and medium-sized left posterior communicating arteries. The PCAs are patent, however there are severe mid to distal left P2 stenoses which are new from the 2019 CTA and progressive from the recent MRA. No aneurysm is identified. Venous sinuses: Patent. Anatomic variants: Hypoplastic left A1. Delayed phase: No abnormal enhancement. Review of the MIP images  confirms the above findings IMPRESSION: 1. Chronic right MCA infarct. No definite acute infarct or hemorrhage. 2. 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 3. Patent right MCA stent with 50% intra-stent stenosis. 4. Widely patent cervical carotid and vertebral arteries. Electronically Signed   By: Logan Bores M.D.   On: 11/18/2018 16:06   Mr Jodene Nam Head Wo Contrast  Result Date: 11/02/2018 CLINICAL DATA:  CVA. EXAM: MRI HEAD WITHOUT CONTRAST MRA HEAD WITHOUT CONTRAST TECHNIQUE: Multiplanar, multiecho pulse sequences of the brain and surrounding structures were obtained without intravenous contrast. Angiographic images of the head were obtained using MRA technique without contrast. COMPARISON:  MRI head 12/19/2017 FINDINGS: MRI HEAD FINDINGS Brain: Chronic hemorrhagic infarct right MCA territory. This involves the insula and operculum. Relative sparing of the temporal lobe and basal ganglia. Negative for acute infarct. Negative for hydrocephalus. Minimal chronic ischemic changes in the white matter. Exam was limited due to claustrophobia. Vascular: Right MCA stent extending into the sylvian fissure causing artifact. Normal flow voids at the base of the brain. Skull and upper cervical spine: Negative Sinuses/Orbits: Negative Other: None MRA HEAD FINDINGS Both vertebral arteries widely patent. Basilar widely patent. Left PICA normal. Right PICA not visualized however may be supplied by a large right AICA. Superior cerebellar and posterior cerebral arteries patent. Left posterior communicating artery patent. Mild stenosis and irregularity in the mid and distal posterior cerebral artery bilaterally. Internal carotid artery widely patent bilaterally. Both anterior cerebral arteries patent. Dominant right A1 segment. Hypoplastic left A1 segment.  Left middle cerebral artery and branches widely patent Loss of flow related signal in the right M1 segment due to artifact from stent. Cannot rule out stenosis in this  area due to artifact. There is distal flow in the right MCA branches. IMPRESSION: Chronic hemorrhagic right MCA territory infarct. No acute infarct. Mild chronic microvascular ischemic changes. Right MCA stent causing artifact. There is distal flow in the right MCA branches. Mild atherosclerotic irregularity in the posterior cerebral arteries bilaterally. Patient not able to complete all sequences due to claustrophobia. Electronically Signed   By: Franchot Gallo M.D.   On: 11/02/2018 08:23   Mr Brain Wo Contrast  Result Date: 11/19/2018 CLINICAL DATA:  Initial evaluation for TIA, right head and facial twitching. EXAM: MRI HEAD WITHOUT CONTRAST MRA HEAD WITHOUT CONTRAST TECHNIQUE: Multiplanar, multiecho pulse sequences of the brain and surrounding structures were obtained without intravenous contrast. Angiographic images of the head were obtained using MRA technique without contrast. COMPARISON:  Prior CTA from earlier the same day as well as previous MRI from 11/01/2018. FINDINGS: MRI HEAD FINDINGS Brain: Cerebral volume within normal limits for age. Mild chronic microvascular ischemic changes present within the periventricular white matter and pons. Extensive encephalomalacia with gliosis present within the right frontal lobe, compatible with chronic right MCA territory infarct. Associated chronic hemosiderin staining with scattered laminar necrosis. Mild wallerian degeneration at the right cerebral peduncle. Appearance is stable from previous. No abnormal foci of restricted diffusion to suggest acute or subacute ischemia. Gray-white matter differentiation otherwise maintained. No evidence for acute intracranial hemorrhage. No mass lesion, midline shift or mass effect. No hydrocephalus. No extra-axial fluid collection. Incidental note made of a partially empty sella. Pituitary gland otherwise unremarkable. Midline structures intact. Vascular: Susceptibility artifact from known right MCA stent. Normal vascular  flow void seen distally within the right MCA branches. Remaining of the intracranial vascular flow voids maintained Skull and upper cervical spine: Craniocervical junction normal. Mild degenerative spondylolysis within the upper cervical spine without significant stenosis. No focal marrow replacing lesion. Scalp soft tissues unremarkable. Sinuses/Orbits: Globes and orbital soft tissues within normal limits. Mild scattered mucosal thickening within the ethmoidal air cells and maxillary sinuses. No air-fluid level to suggest acute sinusitis. Trace opacity right mastoid air cells, of doubtful significance. Inner ear structures grossly normal. Other: None. MRA HEAD FINDINGS ANTERIOR CIRCULATION: Examination mildly degraded by motion artifact. Distal cervical segments of the internal carotid arteries are patent with fairly symmetric antegrade flow. Distal cervical right ICA mildly tortuous. Petrous, cavernous, and supraclinoid segments patent without flow-limiting stenosis. Origin of the ophthalmic arteries patent. ICA termini well perfused. A1 segments grossly patent bilaterally. Left A1 hypoplastic. Anterior communicating artery grossly normal on this motion grade exam. Anterior cerebral arteries patent to their distal aspects without appreciable stenosis. Susceptibility artifact from vascular stent at the right M1 segment extending into proximal M2 branch. Grossly patent flow through the stent, with flow related signal seen distally within the right MCA branches. Stent stenosis and patency better seen on prior CTA. Left M1 widely patent. Normal left MCA bifurcation. Distal left MCA branches well perfused. POSTERIOR CIRCULATION: Vertebral arteries patent to the vertebrobasilar junction without stenosis. Dominant left vertebral artery. Patent left PICA. Right PICA not seen. Basilar widely patent to its distal aspect without stenosis. Superior cerebral arteries patent bilaterally. Right PCA largely supplied via the  basilar, although a small right posterior communicating artery noted. Left PCA supplied via the basilar as well as a robust left posterior communicating artery. Moderate to advanced  irregularity with stenoses involving the mid-distal left P2 segment, better seen on prior CTA. Multifocal atheromatous irregularity about the left P1 with mild to moderate stenoses. PCAs otherwise patent to their distal aspects. No intracranial aneurysm. IMPRESSION: MRI HEAD IMPRESSION: 1. No acute intracranial infarct or other abnormality. 2. Chronic hemorrhagic right MCA territory infarct. 3. Underlying mild chronic microvascular ischemic disease. MRA HEAD IMPRESSION: 1. Patent right MCA stent. Intra stent stenosis better evaluated on prior CTA from earlier same day. 2. Moderate multifocal left P1 stenoses, with moderate to advanced mid-distal left P2 stenoses, also seen on prior CTA. 3. Otherwise wide patency of the major arterial intracranial circulation. Electronically Signed   By: Jeannine Boga M.D.   On: 11/19/2018 00:44   Mr Brain Wo Contrast  Result Date: 11/02/2018 CLINICAL DATA:  CVA. EXAM: MRI HEAD WITHOUT CONTRAST MRA HEAD WITHOUT CONTRAST TECHNIQUE: Multiplanar, multiecho pulse sequences of the brain and surrounding structures were obtained without intravenous contrast. Angiographic images of the head were obtained using MRA technique without contrast. COMPARISON:  MRI head 12/19/2017 FINDINGS: MRI HEAD FINDINGS Brain: Chronic hemorrhagic infarct right MCA territory. This involves the insula and operculum. Relative sparing of the temporal lobe and basal ganglia. Negative for acute infarct. Negative for hydrocephalus. Minimal chronic ischemic changes in the white matter. Exam was limited due to claustrophobia. Vascular: Right MCA stent extending into the sylvian fissure causing artifact. Normal flow voids at the base of the brain. Skull and upper cervical spine: Negative Sinuses/Orbits: Negative Other: None MRA  HEAD FINDINGS Both vertebral arteries widely patent. Basilar widely patent. Left PICA normal. Right PICA not visualized however may be supplied by a large right AICA. Superior cerebellar and posterior cerebral arteries patent. Left posterior communicating artery patent. Mild stenosis and irregularity in the mid and distal posterior cerebral artery bilaterally. Internal carotid artery widely patent bilaterally. Both anterior cerebral arteries patent. Dominant right A1 segment. Hypoplastic left A1 segment. Left middle cerebral artery and branches widely patent Loss of flow related signal in the right M1 segment due to artifact from stent. Cannot rule out stenosis in this area due to artifact. There is distal flow in the right MCA branches. IMPRESSION: Chronic hemorrhagic right MCA territory infarct. No acute infarct. Mild chronic microvascular ischemic changes. Right MCA stent causing artifact. There is distal flow in the right MCA branches. Mild atherosclerotic irregularity in the posterior cerebral arteries bilaterally. Patient not able to complete all sequences due to claustrophobia. Electronically Signed   By: Franchot Gallo M.D.   On: 11/02/2018 08:23   Mr Jodene Nam Head Wo Contrast  Result Date: 11/19/2018 CLINICAL DATA:  Initial evaluation for TIA, right head and facial twitching. EXAM: MRI HEAD WITHOUT CONTRAST MRA HEAD WITHOUT CONTRAST TECHNIQUE: Multiplanar, multiecho pulse sequences of the brain and surrounding structures were obtained without intravenous contrast. Angiographic images of the head were obtained using MRA technique without contrast. COMPARISON:  Prior CTA from earlier the same day as well as previous MRI from 11/01/2018. FINDINGS: MRI HEAD FINDINGS Brain: Cerebral volume within normal limits for age. Mild chronic microvascular ischemic changes present within the periventricular white matter and pons. Extensive encephalomalacia with gliosis present within the right frontal lobe, compatible with  chronic right MCA territory infarct. Associated chronic hemosiderin staining with scattered laminar necrosis. Mild wallerian degeneration at the right cerebral peduncle. Appearance is stable from previous. No abnormal foci of restricted diffusion to suggest acute or subacute ischemia. Gray-white matter differentiation otherwise maintained. No evidence for acute intracranial hemorrhage. No mass lesion,  midline shift or mass effect. No hydrocephalus. No extra-axial fluid collection. Incidental note made of a partially empty sella. Pituitary gland otherwise unremarkable. Midline structures intact. Vascular: Susceptibility artifact from known right MCA stent. Normal vascular flow void seen distally within the right MCA branches. Remaining of the intracranial vascular flow voids maintained Skull and upper cervical spine: Craniocervical junction normal. Mild degenerative spondylolysis within the upper cervical spine without significant stenosis. No focal marrow replacing lesion. Scalp soft tissues unremarkable. Sinuses/Orbits: Globes and orbital soft tissues within normal limits. Mild scattered mucosal thickening within the ethmoidal air cells and maxillary sinuses. No air-fluid level to suggest acute sinusitis. Trace opacity right mastoid air cells, of doubtful significance. Inner ear structures grossly normal. Other: None. MRA HEAD FINDINGS ANTERIOR CIRCULATION: Examination mildly degraded by motion artifact. Distal cervical segments of the internal carotid arteries are patent with fairly symmetric antegrade flow. Distal cervical right ICA mildly tortuous. Petrous, cavernous, and supraclinoid segments patent without flow-limiting stenosis. Origin of the ophthalmic arteries patent. ICA termini well perfused. A1 segments grossly patent bilaterally. Left A1 hypoplastic. Anterior communicating artery grossly normal on this motion grade exam. Anterior cerebral arteries patent to their distal aspects without appreciable  stenosis. Susceptibility artifact from vascular stent at the right M1 segment extending into proximal M2 branch. Grossly patent flow through the stent, with flow related signal seen distally within the right MCA branches. Stent stenosis and patency better seen on prior CTA. Left M1 widely patent. Normal left MCA bifurcation. Distal left MCA branches well perfused. POSTERIOR CIRCULATION: Vertebral arteries patent to the vertebrobasilar junction without stenosis. Dominant left vertebral artery. Patent left PICA. Right PICA not seen. Basilar widely patent to its distal aspect without stenosis. Superior cerebral arteries patent bilaterally. Right PCA largely supplied via the basilar, although a small right posterior communicating artery noted. Left PCA supplied via the basilar as well as a robust left posterior communicating artery. Moderate to advanced irregularity with stenoses involving the mid-distal left P2 segment, better seen on prior CTA. Multifocal atheromatous irregularity about the left P1 with mild to moderate stenoses. PCAs otherwise patent to their distal aspects. No intracranial aneurysm. IMPRESSION: MRI HEAD IMPRESSION: 1. No acute intracranial infarct or other abnormality. 2. Chronic hemorrhagic right MCA territory infarct. 3. Underlying mild chronic microvascular ischemic disease. MRA HEAD IMPRESSION: 1. Patent right MCA stent. Intra stent stenosis better evaluated on prior CTA from earlier same day. 2. Moderate multifocal left P1 stenoses, with moderate to advanced mid-distal left P2 stenoses, also seen on prior CTA. 3. Otherwise wide patency of the major arterial intracranial circulation. Electronically Signed   By: Jeannine Boga M.D.   On: 11/19/2018 00:44    (Echo, Carotid, EGD, Colonoscopy, ERCP)    Subjective: No complains  Discharge Exam: Vitals:   11/20/18 0720 11/20/18 1123  BP: 108/65 112/68  Pulse: 68 60  Resp: 18 18  Temp: 97.7 F (36.5 C) 98 F (36.7 C)  SpO2: 100%  100%     General: Pt is alert, awake, not in acute distress Cardiovascular: RRR, S1/S2 +, no rubs, no gallops Respiratory: CTA bilaterally, no wheezing, no rhonchi Abdominal: Soft, NT, ND, bowel sounds + Extremities: no edema, no cyanosis    The results of significant diagnostics from this hospitalization (including imaging, microbiology, ancillary and laboratory) are listed below for reference.     Microbiology: No results found for this or any previous visit (from the past 240 hour(s)).   Labs: BNP (last 3 results) No results for input(s): BNP in  the last 8760 hours. Basic Metabolic Panel: Recent Labs  Lab 11/18/18 1422 11/20/18 0518  NA 141 140  K 4.2 3.7  CL 110 111  CO2 27 23  GLUCOSE 104* 95  BUN 12 11  CREATININE 0.56 0.47  CALCIUM 9.6 9.0   Liver Function Tests: Recent Labs  Lab 11/18/18 1422  AST 21  ALT 24  ALKPHOS 68  BILITOT 0.7  PROT 7.4  ALBUMIN 4.1   No results for input(s): LIPASE, AMYLASE in the last 168 hours. No results for input(s): AMMONIA in the last 168 hours. CBC: Recent Labs  Lab 11/18/18 1422 11/20/18 0518  WBC 8.8 7.9  NEUTROABS 6.1  --   HGB 15.0 12.9  HCT 48.0* 41.0  MCV 92.7 91.5  PLT 262 259   Cardiac Enzymes: No results for input(s): CKTOTAL, CKMB, CKMBINDEX, TROPONINI in the last 168 hours. BNP: Invalid input(s): POCBNP CBG: Recent Labs  Lab 11/19/18 1112 11/19/18 1653 11/19/18 2105 11/20/18 0603 11/20/18 1029  GLUCAP 83 82 99 91 94   D-Dimer No results for input(s): DDIMER in the last 72 hours. Hgb A1c Recent Labs    11/19/18 0554  HGBA1C 5.4   Lipid Profile Recent Labs    11/19/18 0554  CHOL 134  HDL 33*  LDLCALC 87  TRIG 69  CHOLHDL 4.1   Thyroid function studies No results for input(s): TSH, T4TOTAL, T3FREE, THYROIDAB in the last 72 hours.  Invalid input(s): FREET3 Anemia work up No results for input(s): VITAMINB12, FOLATE, FERRITIN, TIBC, IRON, RETICCTPCT in the last 72  hours. Urinalysis    Component Value Date/Time   COLORURINE STRAW (A) 11/18/2018 1248   APPEARANCEUR CLEAR 11/18/2018 1248   LABSPEC 1.006 11/18/2018 1248   PHURINE 8.0 11/18/2018 1248   GLUCOSEU NEGATIVE 11/18/2018 1248   HGBUR NEGATIVE 11/18/2018 [place] 11/18/2018 [place] 11/18/2018 [place] 11/18/2018 1248   NITRITE NEGATIVE 11/18/2018 [place] 11/18/2018 1248   Sepsis Labs Invalid input(s): PROCALCITONIN,  WBC,  LACTICIDVEN Microbiology No results found for this or any previous visit (from the past 240 hour(s)).   Time coordinating discharge: 40 minutes  SIGNED:   Charlynne Cousins, MD  [facility]

## 2018-11-20 NOTE — Procedures (Signed)
S/P 4 vessel cerebral arteriogram  RT CFA approach. Findings. 1.Approx 30 % intrastent narrowing

## 2018-11-21 LAB — RPR: RPR Ser Ql: NONREACTIVE

## 2018-11-26 ENCOUNTER — Ambulatory Visit (INDEPENDENT_AMBULATORY_CARE_PROVIDER_SITE_OTHER): Payer: Self-pay | Admitting: Neurology

## 2018-11-26 ENCOUNTER — Encounter (HOSPITAL_COMMUNITY): Payer: Self-pay | Admitting: Interventional Radiology

## 2018-11-26 VITALS — BP 104/70 | HR 80 | Ht 67.0 in | Wt 216.4 lb

## 2018-11-26 DIAGNOSIS — G514 Facial myokymia: Secondary | ICD-10-CM

## 2018-11-26 NOTE — Progress Notes (Signed)
[facility] [address] [place]. [place] [address] [phone]       OFFICE FOLLOW-UP NOTE  [name] Date of Birth:  24-Oct-1955 Medical Record Number:  [id number]   HPI: Ms Heather [name] is a 63 year old pleasant Hispanic lady who seen today for first office follow-up visit following hospital admission for stroke in March 2019. She is accompanied by her daughter as well as Spanish language interpreter was present throughout this visit. History is obtained from them as well as review of electronic medical records. I have personally reviewed imaging films.Heather Penais an 63 y.o.femaleHispanic with PMH of HTN,Obesity who presents [name] emergency room as a stroke alert with left hemiparesis. Patient is Spanish-speaking and initially EMS was told that her symptoms began at 9 PM after she dropped a glass in her left hand. CT head showed no hemorrhage and tPA was mixed. However after nurse spoke to her in [place], patient stated that she had a headache that began around 6:30pm and had left side weakness as she was outside window period upon completion of CT scan.  CTA head and neck was performed which showed a superior division M2 cutoff. After discussing with family given her borderline NIHSS of 7, however worsening symptoms ( from EMS assessment to arrival at hospital) we decided to take the patient for mechanical thrombectomy. Date last known well:3.17.19Time last known well:around 6.30 pm tPA Given:no, outside window at time of CT scan NIHSS:7 ( at time of arrival) Baseline MRS0  . After written informed consent from the patient and family She was taken to the interventional radiology suite for thrombectomy and right middle cerebral artery  Rescue stent placement performed by Dr. Estanislado Pandy for progressive occlusion of the inferior division of the right middle cerebral artery due to underlying stenosis. She also received 10 mg of superselective into arterial  Integrilin. She was given loading dose of 180 mg of Brilinta and aspirin. The patient was initially and in the neurological intensive care unit and closely monitored she did well. MRI scan of the brain showed a moderate-sized right MCA infarct.Marland Kitchen LDL cholesterol 67 mg percent and women A1c was 6.7. Transthoracic echo showed normal ejection fraction. Patient had initial right gaze preference and left hemiplegia but made gradual improvement. She was seen by physical occupational and speech therapy. She was transferred to inpatient rehabilitation and has done well.   05/16/2018 visit: She is currently living at home. She is able to ambulate without assistance. She has only mild diminished fine motor skills in the left hand but otherwise appears to have made a full recovery. She feels she is 85% better. She does complain of decreased stamina and gets tired easily. She is eating healthy and is active and in fact has lost weight. Her fasting sugars have all ranged below 150 and blood pressure is well controlled and it is 106/80 today. She did undergo follow-up cerebral catheter angiogram by Dr. Estanislado Pandy on 03/06/18 which I have personally reviewed showed 50% right middle cerebral artery IntraStent restenosis but good flow distally.  Interval history 08/16/2018: patient is being seen today for follow up and is accompanied by interpreter and daughter. She states she has been doing well. She denies any residual left hand weakness but does endorse pain in left wrist intermittently along with finger joints.  Comes quick and then goes away.  She also endorses right sided intermittent neck pain that has been present since Friday.  She has tried 1 dose of Tylenol which did help but  has not tried again.  She denies any other, neurological deficit and states this neck pain is not debilitating.  Denies any type of trauma or injury.  She did experience this after hospital discharge but subsided and has recently returned.  Denies any  type of headache, migraine, visual loss or dizziness.  Continues on aspirin and brilinta without side effects of bleeding but does endorse easily bruising. Glucose levels have been stable as she does monitor at home. Blood pressure today 99/71. She will be following up with Dr. Estanislado Pandy next month for repeat imaging and length of DAPT will be determined at that time.  She also endorses approximately 50 pound weight loss since her stroke from diet and exercise.  No further concerns at this time.  Denies new or worsening stroke/TIA symptoms. Update 11/26/2018 : She returns for follow-up after last visit 3 months ago.  She is accompanied by a [place] language interpreter who was present throughout this visit.  Patient was recently hospitalized to [facility] on 11/19/2017 and I personally reviewed hospital records and imaging films.  She woke up that day and felt her face was drawing up with some twitching's on the right side which lasted 10 minutes.  Her symptoms cleared by the time she reached the hospital.  CT angiogram of the brain was obtained emergently which showed 50% IntraStent stenosis.  She was admitted for overnight observation and underwent MRI scan of the brain which showed no acute stroke.  She underwent diagnostic cerebral catheter angiogram on 11/20/18 by Dr Estanislado Pandy which showed only 30 to 40% IntraStent restenosis with excellent flow distally.  EEG was normal.  Patient symptoms were felt to be of unclear etiology and she was recommended to continue aspirin and Brilinta and maintain aggressive risk factor modification.  She states she is done well since then she has had no recurrent facial twitching or drawing.  She is had no other stroke or TIA symptoms.  She is tolerating aspirin and Brilinta well without bleeding or bruising.  Her blood pressure is well controlled and today it is 104/79.  She is tolerating Lipitor well without muscle aches and pains.  Her last LDL cholesterol was 87 mg  percent on 11/19/2018.  She states her sugars are well controlled and last hemoglobin A1c was 5.4 on 11/19/2018.  She states she is eating healthy and has lost 60 pounds over the last 1 year.  She has no new complaints today. ROS:   14 system review of systems is positive for facial twitching's and numbness and all other systems negative  PMH:  Past Medical History:  Diagnosis Date  . Hypertension   . Stroke [facility])     Social History:  Social History   Socioeconomic History  . Marital status: Single    Spouse name: Not on file  . Number of children: Not on file  . Years of education: Not on file  . Highest education level: Not on file  Occupational History  . Not on file  Social Needs  . Financial resource strain: Not on file  . Food insecurity:    Worry: Not on file    Inability: Not on file  . Transportation needs:    Medical: Not on file    Non-medical: Not on file  Tobacco Use  . Smoking status: Former Smoker    Packs/day: 0.50    Years: 30.00    Pack years: 15.00    Types: Cigarettes    Last attempt to quit:  12/04/2017    Years since quitting: 0.9  . Smokeless tobacco: Never Used  Substance and Sexual Activity  . Alcohol use: Not Currently    Comment: weekly  . Drug use: No  . Sexual activity: Not Currently    Birth control/protection: None  Lifestyle  . Physical activity:    Days per week: Not on file    Minutes per session: Not on file  . Stress: Not on file  Relationships  . Social connections:    Talks on phone: Not on file    Gets together: Not on file    Attends religious service: Not on file    Active member of club or organization: Not on file    Attends meetings of clubs or organizations: Not on file    Relationship status: Not on file  . Intimate partner violence:    Fear of current or ex partner: Not on file    Emotionally abused: Not on file    Physically abused: Not on file    Forced sexual activity: Not on file  Other Topics Concern  . Not  on file  Social History Narrative  . Not on file    Medications:   Current Outpatient Medications on File Prior to Visit  Medication Sig Dispense Refill  . acetaminophen (TYLENOL) 500 MG tablet Take 1,000 mg by mouth every 8 (eight) hours as needed for moderate pain or headache.    Marland Kitchen aspirin 81 MG chewable tablet Chew 1 tablet (81 mg total) by mouth daily.    Marland Kitchen atorvastatin (LIPITOR) 10 MG tablet Take 1 tablet (10 mg total) by mouth daily at 6 PM. 30 tablet 3  . Blood Glucose Monitoring Suppl (TRUE METRIX METER) DEVI 1 kit by Does not apply route 4 (four) times daily. 1 Device 0  . glimepiride (AMARYL) 1 MG tablet TAKE 1 TABLET BY MOUTH DAILY WITH BREAKFAST. (Patient taking differently: Take 1 mg by mouth daily with breakfast. ) 30 tablet 2  . glucose blood (TRUE METRIX BLOOD GLUCOSE TEST) test strip Use as instructed 100 each 12  . polyethylene glycol powder (GLYCOLAX/MIRALAX) powder Take 17 g by mouth daily as needed. (Patient taking differently: Take 17 g by mouth daily as needed for moderate constipation. ) 3350 g 1  . sertraline (ZOLOFT) 50 MG tablet Take 1 tablet (50 mg total) by mouth daily. 30 tablet 2  . ticagrelor (BRILINTA) 90 MG TABS tablet Take 1 tablet (90 mg total) by mouth 2 (two) times daily. 60 tablet 3  . TRUEPLUS LANCETS 28G MISC 28 g by Does not apply route QID. 120 each 3   No current facility-administered medications on file prior to visit.     Allergies:  No Known Allergies  Physical Exam General: Mildly obese pleasant middle-age  Hispanic Spanish-speaking lady, seated, in no evident distress Head: head normocephalic and atraumatic.  Neck: supple with no carotid or supraclavicular bruits Cardiovascular: regular rate and rhythm, no murmurs Musculoskeletal: no deformity Skin:  no rash/petichiae Vascular:  Normal pulses all extremities Vitals:   11/26/18 1035  BP: 104/70  Pulse: 80   Neurologic Exam Mental Status: Awake and fully alert. Oriented to place and  time. Recent and remote memory intact. Attention span, concentration and fund of knowledge appropriate. Mood and affect appropriate.  Cranial Nerves: Fundoscopic exam not done. Pupils equal, briskly reactive to light. Extraocular movements full without nystagmus. Visual fields full to confrontation. Hearing intact. Facial sensation intact. Face, tongue, palate moves normally and symmetrically.  Motor: Normal bulk and tone. Normal strength in all tested extremity muscles.  Mild diminished fine finger movements on the left. Orbits right over left upper extremity. Sensory.: intact to touch ,pinprick .position and vibratory sensation.  Coordination: Rapid alternating movements normal in all extremities. Finger-to-nose and heel-to-shin performed accurately bilaterally. Gait and Station: Arises from chair without difficulty. Stance is normal. Gait demonstrates normal stride length and balance . Able to heel, toe and tandem walk without difficulty.  Reflexes: 1+ and symmetric. Toes downgoing.      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 Vascular risk factors of intracranial stenosis, diabetes, hypertension and hyperlipidemia.  Recent hospitalization a week ago for transient facial twitchings of unclear etiology with negative neuroimaging for recurrent stroke or significant restenosis of intracranial stent     PLAN: I had a long d/w patient via [place] language interpreter about her remote stroke, recent admission for facial twitchings, risk for recurrent stroke/TIAs, personally independently reviewed imaging studies and stroke evaluation results and answered questions.Continue aspirin 81 mg daily and  brillinta  for secondary stroke prevention  Given her intracranial stent and maintain strict control of hypertension with blood pressure goal below 130/90, diabetes  with hemoglobin A1c goal below 6.5% and lipids with LDL cholesterol goal below 70 mg/dL. I also advised the patient to eat a healthy diet with plenty of whole grains, cereals, fruits and vegetables, exercise regularly and maintain ideal body weight .follow-up with Dr. Estanislado Pandy for her intracranial stent. Greater than 50% of time during this 25 minute visit was spent on counseling,explanation of diagnosis of intracranials tenosis, stenting, stroke, planning of further management, discussion with patient and family and coordination of care  Followup in the future with my nurse practitioner Janett Billow in  6 months or call earlier if necessary.   Antony Contras, MD [facility] [address] [place], [place] [address]  Phone [phone] Fax [phone] Note: This document was prepared with digital dictation and possible smart phrase technology. Any transcriptional errors that result from this process are unintentional.

## 2018-11-26 NOTE — Patient Instructions (Signed)
I had a long d/w patient via spanish language interpreter about her remote stroke, recent admission for facial twitchings, risk for recurrent stroke/TIAs, personally independently reviewed imaging studies and stroke evaluation results and answered questions.Continue aspirin 81 mg daily and  brillinta  for secondary stroke prevention  Given her intracranial stent and maintain strict control of hypertension with blood pressure goal below 130/90, diabetes with hemoglobin A1c goal below 6.5% and lipids with LDL cholesterol goal below 70 mg/dL. I also advised the patient to eat a healthy diet with plenty of whole grains, cereals, fruits and vegetables, exercise regularly and maintain ideal body weight .follow-up with Dr. Corliss Skains for her intracranial stent.  Followup in the future with my nurse practitioner Shanda Bumps in  6 months or call earlier if necessary.  Prevencin del ictus Stroke Prevention Algunas enfermedades o conductas se asocian a un aumento en el riesgo de sufrir un accidente cerebrovascular. Puede ayudar a prevenir un accidente cerebrovascular optando por una alimentacin y un estilo de vida ms saludables y haciendo otros cambios, incluyendo Chief Operating Officer cualquier afeccin mdica que tenga.  Qu cambios nutricionales se pueden realizar?   Consuma alimentos saludables. Puede hacer lo siguiente: ? Elija alimentos ricos en fibra, como frutas y verduras frescas, y cereales integrales. ? Coma 5 o ms porciones de frutas y verduras al da. Trate de que la mitad del plato de cada comida sea de frutas y verduras. ? Elija alimentos con protenas magras, como cortes de carne magros, carne de ave sin piel, pescado, tofu, frijoles y nueces. ? Coma productos lcteos descremados. ? Evite los alimentos con alto contenido de sal (sodio). Esto puede ayudar a Personal assistant presin arterial. ? Evite los alimentos que contienen grasas saturadas, grasas trans y colesterol. Esto puede ayudar a Chief Technology Officer. ? Evite los alimentos procesados ??y precocinados.  Siga las pautas especficas de su mdico para perder peso, Chief Operating Officer la presin arterial alta (hipertensin), reducir el colesterol alto y controlar la diabetes. Estos pueden incluir lo siguiente: ? Reducir su ingesta calrica diaria. ? Limitar su consumo diario de sodio a 1500 miligramos (mg). ? Usar solo aceites saludables para cocinar, como el aceite de [place], [place] o [place]. ? Contar su consumo diario de carbohidratos. Qu cambios en el estilo de vida se pueden realizar?  Mantenga un peso saludable. Hable con el mdico sobre su peso ideal.  [place] por lo menos de actividad fsica moderada 5 das por Schlusser. Actividad fsica moderada incluye caminar rpido, andar en bicicleta y nadar.  No consuma ningn producto que contenga nicotina o tabaco, como cigarrillos y Administrator, Civil Service. Si necesita ayuda para dejar de fumar, consulte al [employer]. Tambin puede ser til evitar la exposicin al humo de [place].  Limite el consumo de alcohol a no ms de por da si es mujer y no est [place], y por da si es hombre. Una medida equivale a 12oz ( ) de cerveza, 5oz ( ) de vino o 1oz (44ml) de bebidas alcohlicas de alta graduacin.  Detenga el consumo de drogas ilegales.  Evite tomar pldoras anticonceptivas. Hable con su mdico [employer] de tomar pldoras anticonceptivas si: ? Es mayor de 35aos. ? Fuma. ? Tiene migraas. ? Alguna vez ha tenido un cogulo de [place]. Qu otros cambios se pueden realizar?  Controle los niveles de [place]. ? Llevar una dieta saludable es importante para prevenir el colesterol alto. Si el colesterol no puede controlarse nicamente con la dieta, es posible que tambin deba tomar medicamentos. ?  Tome los medicamentos para Public house manager como se lo haya indicado su mdico.  Controle su diabetes. ? Llevar una dieta saludable y  hacer ejercicio regularmente son partes importantes del control de su nivel de Banker. Si su nivel de azcar en la sangre no puede controlarse mediante dieta y ejercicio, es posible que deba tomar medicamentos. ? [employer] para controlar la diabetes como se lo haya indicado su mdico.  Controle su hipertensin. ? Para reducir su riesgo de accidente cerebrovascular, trate de mantener su presin arterial por debajo de 130/80. ? Llevar una dieta saludable y hacer ejercicio regularmente son partes importantes del control de su presin arterial. Si su presin arterial no puede controlarse mediante dieta y ejercicio, es posible que deba tomar medicamentos. ? [employer] para controlar la hipertensin como se lo haya indicado su mdico. ? Pregntele a su mdico si debera medirse su presin arterial en el hogar. ? Controle su presin arterial todos los aos, incluso si su presin arterial es normal. La presin arterial aumenta con la edad y algunas afecciones mdicas.  Hgase una evaluacin de los trastornos del sueo (apnea del sueo). Hable con su mdico sobre cmo hacer una evaluacin del sueo si ronca mucho o tiene excesiva somnolencia.  Tome los medicamentos de venta libre y los recetados solamente como se lo haya indicado el mdico. Es posible que se le recomiende tomar aspirina o un diluyente de la sangre (antiplaquetario o Environmental education officer) para reducir Nurse, adult de formacin de cogulos de sangre que pueden provocar una accidente cerebrovascular.  Asegrese de tener bajo control cualquier otra afeccin mdica que tenga, como la fibrilacin auricular o la aterosclerosis. Cules son las seales de alerta de un accidente cerebrovascular? Los signos de advertencia de un accidente cerebrovascular se pueden recordar fcilmente como: "BEFAST".  B es por "balance" (equilibrio). Algunos signos son los siguientes: ? Mareos. ? Prdida del equilibrio o de la  coordinacin. ? Dificultad repentina para caminar.  E es por "eyes" (ojos). Algunos signos son los siguientes: ? Un cambio repentino en la visin. ? Dificultad para ver.  F es por "face" (rostro). Algunos signos son los siguientes: ? Debilidad o adormecimiento del rostro. ? Su rostro o prpado se caen hacia un lado.  A es por "arms" (brazo). Algunos signos son los siguientes: ? Debilidad o adormecimiento sbito del brazo, generalmente en un lado del cuerpo.  S es por "speech" (habla). Algunos signos son los siguientes: ? Dificultad para hablar (afasia). ? Dificultad para comprender.  T es por "time" Animal nutritionist). ? Estos sntomas pueden representar un problema grave que constituye Radio broadcast assistant. No espere hasta que los sntomas desaparezcan. Solicite atencin mdica de inmediato. Llame a su servicio de Marine scientist (911 en los [place]). No conduzca por sus propios medios [employer] hospital.  Otros signos de un accidente cerebrovascular pueden incluir: ? Dolor de cabeza sbito e intenso que no tiene causa aparente. ? Nuseas o vmitos. ? Convulsiones. Dnde encontrar ms informacin Para ms informacin, visite el siguiente enlace:  American Stroke Association (Asociacin Americana de Accidente Cerebrovascular): www.strokeassociation.org  National Stroke Association Engineering geologist de Accidente Cerebrovascular): www.stroke.org Resumen  Puede prevenir un accidente cerebrovascular optando por una alimentacin saludable, hacer ejercicio, no fumar, limitar el consumo de alcohol, y manteniendo bajo control cualquier afeccin mdica que tenga.  No consuma ningn producto que contenga nicotina o tabaco, como cigarrillos y Administrator, Civil Service. Si necesita ayuda para dejar de fumar, consulte al mdico. Tambin puede ser  til evitar la exposicin al humo de Eastman Chemicalsegunda mano.  Recuerde las seales de alerta de un accidente cerebrovascular "BEFAST". Obtenga ayuda de inmediato si  usted o un ser querido presenta alguna de estas seales de Control and instrumentation engineeralerta. Esta informacin no tiene Theme park managercomo fin reemplazar el consejo del mdico. Asegrese de hacerle al mdico cualquier pregunta que tenga. Document Released: 09/19/2005 Document Revised: 01/31/2017 Document Reviewed: 01/31/2017 Elsevier Interactive Patient Education  2019 [employer]

## 2018-12-21 ENCOUNTER — Inpatient Hospital Stay: Payer: Self-pay | Admitting: Internal Medicine

## 2019-01-01 ENCOUNTER — Other Ambulatory Visit: Payer: Self-pay

## 2019-01-01 ENCOUNTER — Ambulatory Visit: Payer: Self-pay | Admitting: Internal Medicine

## 2019-01-02 ENCOUNTER — Inpatient Hospital Stay: Payer: Self-pay

## 2019-01-02 NOTE — Progress Notes (Deleted)
Patient ID: Heather [name], female   DOB: [birth year], 63 y.o.   MRN: [id number]  After hospitalization 2/16-2/18/2020.  From discharge summary: Recommendations for Outpatient Follow-up:  1. Follow up with PCP in 1-2 weeks 2. Follow-up with IR in 6 months.   Home Health:No Equipment/Devices:none  Discharge Condition:stable CODE STATUS:Full Diet recommendation: Heart Healthy  Brief/Interim Summary: femalepast medical history of essential hypertension, a CVA with a right M2 occlusion on March 2019 status post thrombectomy and right MCA stent on aspirin and Brilinta, with a residual left-sided face droop who is here for evaluation of slurred speech that started on the day of admission, she relates it lasted about 10 to 20 minutes.  Discharge Diagnoses:  Active Problems:   Left hemiparesis (HCC)   Diabetes mellitus type 2 in obese [facility])   Essential hypertension   TIA (transient ischemic attack)  TIA (transient ischemic attack): HDL less than 40 LDL 87, she is currently on  statins. 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 CTA of the head showed a right MCA stent 50% stenosed PT eval is recommended home health PT. Continue aspirin and Brilinta. EEG showed no focal deficits. Neurology was consulted they recommended no further evaluation. Interventional was consulted and perform an angiogram as she dysp scheduled and they recommended to follow-up with them in 6 months.  History of left hemiparesis (HCC) Continue aspirin and Brilinta with a persistent facial droop.  Diabetes mellitus type 2 in obese (HCC) No changes made to her medication.  Essential hypertension Resume medications as an outpatient.

## 2019-01-21 ENCOUNTER — Other Ambulatory Visit: Payer: Self-pay | Admitting: Internal Medicine

## 2019-01-25 ENCOUNTER — Other Ambulatory Visit: Payer: Self-pay | Admitting: Pharmacist

## 2019-01-25 MED ORDER — GLUCOSE BLOOD VI STRP: ORAL_STRIP | 11 refills | Status: DC

## 2019-02-05 ENCOUNTER — Encounter: Payer: Self-pay | Admitting: Internal Medicine

## 2019-02-05 ENCOUNTER — Ambulatory Visit: Payer: Self-pay | Attending: Internal Medicine | Admitting: Internal Medicine

## 2019-02-05 ENCOUNTER — Other Ambulatory Visit: Payer: Self-pay

## 2019-02-05 VITALS — BP 104/80 | Wt 208.0 lb

## 2019-02-05 DIAGNOSIS — Z7189 Other specified counseling: Secondary | ICD-10-CM

## 2019-02-05 DIAGNOSIS — M25511 Pain in right shoulder: Secondary | ICD-10-CM

## 2019-02-05 DIAGNOSIS — Z1239 Encounter for other screening for malignant neoplasm of breast: Secondary | ICD-10-CM

## 2019-02-05 DIAGNOSIS — Z8673 Personal history of transient ischemic attack (TIA), and cerebral infarction without residual deficits: Secondary | ICD-10-CM

## 2019-02-05 DIAGNOSIS — E669 Obesity, unspecified: Secondary | ICD-10-CM

## 2019-02-05 DIAGNOSIS — E1169 Type 2 diabetes mellitus with other specified complication: Secondary | ICD-10-CM

## 2019-02-05 DIAGNOSIS — Z1211 Encounter for screening for malignant neoplasm of colon: Secondary | ICD-10-CM

## 2019-02-05 DIAGNOSIS — E785 Hyperlipidemia, unspecified: Secondary | ICD-10-CM

## 2019-02-05 MED ORDER — ATORVASTATIN CALCIUM 10 MG PO TABS: 10.0000 mg | ORAL_TABLET | Freq: Every day | ORAL | 3 refills | Status: DC

## 2019-02-05 MED ORDER — TICAGRELOR 90 MG PO TABS: 90.0000 mg | ORAL_TABLET | Freq: Two times a day (BID) | ORAL | 3 refills | Status: DC

## 2019-02-05 NOTE — Progress Notes (Signed)
Virtual Visit via Telephone Note  I connected with [name] on 02/05/19 at 8:44 a.m EDT by telephone from my office and verified that I am speaking with the correct person using two identifiers.  Pt is at home.  [employer] used # [id number].  Only the patient, myself and the interpreter participated in this encounter.   I discussed the limitations, risks, security and privacy concerns of performing an evaluation and management service by telephone and the availability of in person appointments. I also discussed with the patient that there may be a patient responsible charge related to this service. The patient expressed understanding and agreed to proceed.   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  Last seen 10/2018  Hx CVA/HTN:  Since last visit with me, pt was hosp 2/16-18/2020 with TIA symptoms. Cerebral angiogram revealed 30-40% intra-stent stenosis of the RT MCA Saw Dr. Leonie Man post hosp.  Good DM, HTN and cholesterol control encouraged.  No changes made in meds  No recurrent symptoms since dischg  BP this a.m was 104/80.  Checks BP daily.  Reports good readings.  BP has been controlled off med.  Limits salt in foods Reports compliance with ASA, Brilinta, Lipitor.  Endorses easy bruising if she bumps against anything since being on Brilinta/ASA.  No bleeding   DM:  Checks BS 2 x a day.  A.m range below 120 and afternoons level usually in the 90s.  One low BS in the 54s in January.  She was able to drink OJ to bring BS up.  Occasional hypoglycemia.  Last A1C 5.7 in 11/2018 Compliant with low dose Amaryl. No blurred vision.  Over due for eye exam.   Eating habits:  Doing well.  Small portion sizes, lots of fruits and veggies  Exercise: walks daily for 30 mins  C/O pain in RT shoulder x 1 wk.  Started after she "stretch the arm up while making up the bed" 1 wk ago.   Pain with certain movements. Can elevated the arm above the head. No numbness or tingling in arm/hand  Nervous about COVID-19 pandemic  HM:  Over due for MMG.   Given FIT kit in 08/2018.  She lost package.  Observations/Objective: Reported home readings this a.m:  wgh 208 lbs, BP 104/80  Lab Results  Component Value Date   HGBA1C 5.4 11/19/2018   Lab Results  Component Value Date   WBC 7.9 11/20/2018   HGB 12.9 11/20/2018   HCT 41.0 11/20/2018   MCV 91.5 11/20/2018   PLT 259 11/20/2018     Chemistry      Component Value Date/Time   NA 140 11/20/2018 0518   NA 141 10/12/2018 1710   K 3.7 11/20/2018 0518   CL 111 11/20/2018 0518   CO2 23 11/20/2018 0518   BUN 11 11/20/2018 0518   BUN 16 10/12/2018 1710   CREATININE 0.47 11/20/2018 0518   CREATININE 0.53 09/05/2016 1017      Component Value Date/Time   CALCIUM 9.0 11/20/2018 0518   ALKPHOS 68 11/18/2018 1422   AST 21 11/18/2018 1422   ALT 24 11/18/2018 1422   BILITOT 0.7 11/18/2018 1422      Assessment and Plan: 1. Diabetes mellitus type 2 in obese (HCC) -BS at goal Continue healthy eating habits and regular exercise Continue low dose Amaryl  2. History of cardioembolic cerebrovascular accident (CVA) - ticagrelor (BRILINTA)  90 MG TABS tablet; Take 1 tablet (90 mg total) by mouth 2 (two) times daily.  Dispense: 60 tablet; Refill: 3  3. Hyperlipidemia associated with type 2 diabetes mellitus (HCC) - atorvastatin (LIPITOR) 10 MG tablet; Take 1 tablet (10 mg total) by mouth daily at 6 PM.  Dispense: 90 tablet; Refill: 3  4. Acute pain of right shoulder -MSK in nature.  Recommend Tylenol PRN and topical like Icy Hot Rub  5. Educated About Covid-19 Virus Infection -pt expressed anxiety about COVID-19.  Went over [employer] guidelines of ways to prevent spread/contracting the virus.  6. Colon cancer screening Plan to give FIT test again on next in person visit since she misplaced the one given to her in fall  7. Breast  cancer screening - MM Digital Screening; Future   Follow Up Instructions: F/u in 3 mths   I discussed the assessment and treatment plan with the patient. The patient was provided an opportunity to ask questions and all were answered. The patient agreed with the plan and demonstrated an understanding of the instructions.   The patient was advised to call back or seek an in-person evaluation if the symptoms worsen or if the condition fails to improve as anticipated.  I provided 24 minutes of non-face-to-face time during this encounter.   Karle Plumber, MD

## 2019-02-14 ENCOUNTER — Ambulatory Visit: Payer: Self-pay | Admitting: Adult Health

## 2019-03-23 ENCOUNTER — Encounter (HOSPITAL_COMMUNITY): Payer: Self-pay | Admitting: Emergency Medicine

## 2019-03-23 ENCOUNTER — Observation Stay (HOSPITAL_COMMUNITY)
Admission: EM | Admit: 2019-03-23 | Discharge: 2019-03-25 | Disposition: A | Discharge status: Home / Self Care | Payer: Self-pay | Attending: Internal Medicine | Admitting: Internal Medicine

## 2019-03-23 ENCOUNTER — Emergency Department (HOSPITAL_COMMUNITY): Payer: Self-pay

## 2019-03-23 ENCOUNTER — Other Ambulatory Visit: Payer: Self-pay

## 2019-03-23 DIAGNOSIS — Z87891 Personal history of nicotine dependence: Secondary | ICD-10-CM | POA: Insufficient documentation

## 2019-03-23 DIAGNOSIS — E1169 Type 2 diabetes mellitus with other specified complication: Secondary | ICD-10-CM | POA: Diagnosis present

## 2019-03-23 DIAGNOSIS — Z79899 Other long term (current) drug therapy: Secondary | ICD-10-CM | POA: Insufficient documentation

## 2019-03-23 DIAGNOSIS — I63511 Cerebral infarction due to unspecified occlusion or stenosis of right middle cerebral artery: Secondary | ICD-10-CM | POA: Diagnosis present

## 2019-03-23 DIAGNOSIS — I10 Essential (primary) hypertension: Secondary | ICD-10-CM | POA: Insufficient documentation

## 2019-03-23 DIAGNOSIS — G8194 Hemiplegia, unspecified affecting left nondominant side: Secondary | ICD-10-CM | POA: Diagnosis present

## 2019-03-23 DIAGNOSIS — Z20828 Contact with and (suspected) exposure to other viral communicable diseases: Secondary | ICD-10-CM | POA: Insufficient documentation

## 2019-03-23 DIAGNOSIS — F32A Depression, unspecified: Secondary | ICD-10-CM | POA: Diagnosis present

## 2019-03-23 DIAGNOSIS — R569 Unspecified convulsions: Principal | ICD-10-CM | POA: Insufficient documentation

## 2019-03-23 DIAGNOSIS — Z7982 Long term (current) use of aspirin: Secondary | ICD-10-CM | POA: Insufficient documentation

## 2019-03-23 DIAGNOSIS — F329 Major depressive disorder, single episode, unspecified: Secondary | ICD-10-CM | POA: Insufficient documentation

## 2019-03-23 DIAGNOSIS — Z8673 Personal history of transient ischemic attack (TIA), and cerebral infarction without residual deficits: Secondary | ICD-10-CM | POA: Insufficient documentation

## 2019-03-23 HISTORY — DX: Depression, unspecified: F32.A

## 2019-03-23 HISTORY — DX: Unspecified convulsions: R56.9

## 2019-03-23 HISTORY — DX: Type 2 diabetes mellitus without complications: E11.9

## 2019-03-23 LAB — URINALYSIS, ROUTINE W REFLEX MICROSCOPIC
Bilirubin Urine: NEGATIVE
Glucose, UA: NEGATIVE mg/dL
Hgb urine dipstick: NEGATIVE
Ketones, ur: 20 mg/dL — AB
Nitrite: NEGATIVE
Protein, ur: NEGATIVE mg/dL
Specific Gravity, Urine: 1.017 (ref 1.005–1.030)
pH: 5 (ref 5.0–8.0)

## 2019-03-23 LAB — COMPREHENSIVE METABOLIC PANEL
ALT: 27 U/L (ref 0–44)
AST: 27 U/L (ref 15–41)
Albumin: 3.8 g/dL (ref 3.5–5.0)
Alkaline Phosphatase: 71 U/L (ref 38–126)
Anion gap: 11 (ref 5–15)
BUN: 9 mg/dL (ref 8–23)
CO2: 21 mmol/L — ABNORMAL LOW (ref 22–32)
Calcium: 9.1 mg/dL (ref 8.9–10.3)
Chloride: 109 mmol/L (ref 98–111)
Creatinine, Ser: 0.62 mg/dL (ref 0.44–1.00)
GFR calc Af Amer: >60 mL/min (ref >60)
GFR calc non Af Amer: >60 mL/min (ref >60)
Glucose, Bld: 141 mg/dL — ABNORMAL HIGH (ref 70–99)
Potassium: 4.4 mmol/L (ref 3.5–5.1)
Sodium: 141 mmol/L (ref 135–145)
Total Bilirubin: 0.7 mg/dL (ref 0.3–1.2)
Total Protein: 6.9 g/dL (ref 6.5–8.1)

## 2019-03-23 LAB — DIFFERENTIAL
Abs Immature Granulocytes: 0.01 10*3/uL (ref 0.00–0.07)
Basophils Absolute: 0 10*3/uL (ref 0.0–0.1)
Basophils Relative: 0 %
Eosinophils Absolute: 0.1 10*3/uL (ref 0.0–0.5)
Eosinophils Relative: 1 %
Immature Granulocytes: 0 %
Lymphocytes Relative: 22 %
Lymphs Abs: 2.1 10*3/uL (ref 0.7–4.0)
Monocytes Absolute: 0.5 10*3/uL (ref 0.1–1.0)
Monocytes Relative: 5 %
Neutro Abs: 6.8 10*3/uL (ref 1.7–7.7)
Neutrophils Relative %: 72 %

## 2019-03-23 LAB — PROTIME-INR
INR: 1 (ref 0.8–1.2)
Prothrombin Time: 12.6 seconds (ref 11.4–15.2)

## 2019-03-23 LAB — I-STAT CHEM 8, ED
BUN: 13 mg/dL (ref 8–23)
Calcium, Ion: 1.15 mmol/L (ref 1.15–1.40)
Chloride: 108 mmol/L (ref 98–111)
Creatinine, Ser: 0.4 mg/dL — ABNORMAL LOW (ref 0.44–1.00)
Glucose, Bld: 135 mg/dL — ABNORMAL HIGH (ref 70–99)
HCT: 45 % (ref 36.0–46.0)
Hemoglobin: 15.3 g/dL — ABNORMAL HIGH (ref 12.0–15.0)
Potassium: 4.8 mmol/L (ref 3.5–5.1)
Sodium: 141 mmol/L (ref 135–145)
TCO2: 25 mmol/L (ref 22–32)

## 2019-03-23 LAB — CBC
HCT: 43.8 % (ref 36.0–46.0)
Hemoglobin: 14.2 g/dL (ref 12.0–15.0)
MCH: 30.1 pg (ref 26.0–34.0)
MCHC: 32.4 g/dL (ref 30.0–36.0)
MCV: 92.8 fL (ref 80.0–100.0)
Platelets: 268 10*3/uL (ref 150–400)
RBC: 4.72 MIL/uL (ref 3.87–5.11)
RDW: 13.3 % (ref 11.5–15.5)
WBC: 9.5 10*3/uL (ref 4.0–10.5)
nRBC: 0 % (ref 0.0–0.2)

## 2019-03-23 LAB — GLUCOSE, CAPILLARY
Glucose-Capillary: 57 mg/dL — ABNORMAL LOW (ref 70–99)
Glucose-Capillary: 107 mg/dL — ABNORMAL HIGH (ref 70–99)

## 2019-03-23 LAB — APTT: aPTT: 20 seconds — ABNORMAL LOW (ref 24–36)

## 2019-03-23 LAB — CBG MONITORING, ED
Glucose-Capillary: 120 mg/dL — ABNORMAL HIGH (ref 70–99)
Glucose-Capillary: 78 mg/dL (ref 70–99)

## 2019-03-23 LAB — RAPID URINE DRUG SCREEN, HOSP PERFORMED
Amphetamines: NOT DETECTED
Barbiturates: NOT DETECTED
Benzodiazepines: POSITIVE — AB
Cocaine: NOT DETECTED
Opiates: NOT DETECTED
Tetrahydrocannabinol: NOT DETECTED

## 2019-03-23 LAB — SARS CORONAVIRUS 2 BY RT PCR (HOSPITAL ORDER, PERFORMED IN ~~LOC~~ HOSPITAL LAB): SARS Coronavirus 2: NEGATIVE

## 2019-03-23 LAB — ETHANOL: Alcohol, Ethyl (B): <10 mg/dL (ref <10)

## 2019-03-23 MED ORDER — LEVETIRACETAM IN NACL 1000 MG/100ML IV SOLN: 1000.0000 mg | Freq: Two times a day (BID) | INTRAVENOUS | Status: DC

## 2019-03-23 MED ORDER — SENNOSIDES-DOCUSATE SODIUM 8.6-50 MG PO TABS: 1.0000 | ORAL_TABLET | Freq: Every evening | ORAL | Status: DC | PRN

## 2019-03-23 MED ORDER — INSULIN ASPART 100 UNIT/ML ~~LOC~~ SOLN: 0.0000 [IU] | Freq: Three times a day (TID) | SUBCUTANEOUS | Status: DC

## 2019-03-23 MED ORDER — SODIUM CHLORIDE 0.9 % IV BOLUS
500.0000 mL | Freq: Once | INTRAVENOUS | Status: AC
  Administered 2019-03-23: 500 mL via INTRAVENOUS

## 2019-03-23 MED ORDER — LEVETIRACETAM IN NACL 1000 MG/100ML IV SOLN
1000.0000 mg | Freq: Once | INTRAVENOUS | Status: AC
  Administered 2019-03-23: 1000 mg via INTRAVENOUS
  Filled 2019-03-23: qty 100

## 2019-03-23 MED ORDER — LORAZEPAM 2 MG/ML IJ SOLN
2.0000 mg | Freq: Once | INTRAMUSCULAR | Status: AC
  Administered 2019-03-23: 2 mg via INTRAVENOUS
  Filled 2019-03-23: qty 1

## 2019-03-23 MED ORDER — ACETAMINOPHEN 650 MG RE SUPP: 650.0000 mg | RECTAL | Status: DC | PRN

## 2019-03-23 MED ORDER — ASPIRIN 81 MG PO CHEW
81.0000 mg | CHEWABLE_TABLET | Freq: Every day | ORAL | Status: DC
  Administered 2019-03-23 – 2019-03-25 (×3): 81 mg via ORAL
  Filled 2019-03-23 (×3): qty 1

## 2019-03-23 MED ORDER — ENOXAPARIN SODIUM 40 MG/0.4ML ~~LOC~~ SOLN
40.0000 mg | SUBCUTANEOUS | Status: DC
  Administered 2019-03-23 – 2019-03-24 (×2): 40 mg via SUBCUTANEOUS
  Filled 2019-03-23 (×3): qty 0.4

## 2019-03-23 MED ORDER — SODIUM CHLORIDE 0.9 % IV SOLN: 100.0000 mL/h | INTRAVENOUS | Status: DC

## 2019-03-23 MED ORDER — ACETAMINOPHEN 160 MG/5ML PO SOLN: 650.0000 mg | ORAL | Status: DC | PRN

## 2019-03-23 MED ORDER — INSULIN ASPART 100 UNIT/ML ~~LOC~~ SOLN: 0.0000 [IU] | Freq: Every day | SUBCUTANEOUS | Status: DC

## 2019-03-23 MED ORDER — LORAZEPAM 2 MG/ML IJ SOLN
1.0000 mg | Freq: Once | INTRAMUSCULAR | Status: AC
  Administered 2019-03-23: 1 mg via INTRAVENOUS
  Filled 2019-03-23: qty 1

## 2019-03-23 MED ORDER — LEVETIRACETAM 500 MG PO TABS
1000.0000 mg | ORAL_TABLET | Freq: Two times a day (BID) | ORAL | Status: DC
  Administered 2019-03-23 – 2019-03-25 (×4): 1000 mg via ORAL
  Filled 2019-03-23 (×4): qty 2

## 2019-03-23 MED ORDER — TICAGRELOR 90 MG PO TABS
90.0000 mg | ORAL_TABLET | Freq: Two times a day (BID) | ORAL | Status: DC
  Administered 2019-03-23 – 2019-03-25 (×4): 90 mg via ORAL
  Filled 2019-03-23 (×4): qty 1

## 2019-03-23 MED ORDER — ACETAMINOPHEN 500 MG PO TABS: 1000.0000 mg | ORAL_TABLET | Freq: Three times a day (TID) | ORAL | Status: DC | PRN

## 2019-03-23 MED ORDER — ATORVASTATIN CALCIUM 10 MG PO TABS
10.0000 mg | ORAL_TABLET | Freq: Every day | ORAL | Status: DC
  Administered 2019-03-24: 10 mg via ORAL
  Filled 2019-03-23: qty 1

## 2019-03-23 MED ORDER — STROKE: EARLY STAGES OF RECOVERY BOOK
Freq: Once | Status: AC
  Administered 2019-03-23: 23:00:00
  Filled 2019-03-23: qty 1

## 2019-03-23 MED ORDER — ACETAMINOPHEN 325 MG PO TABS
650.0000 mg | ORAL_TABLET | ORAL | Status: DC | PRN
  Administered 2019-03-23 – 2019-03-24 (×2): 650 mg via ORAL
  Filled 2019-03-23 (×2): qty 2

## 2019-03-23 NOTE — Consult Note (Signed)
NEUROLOGY CONSULT  Reason for Consult: Neurologic opinion for seizures Referring Physician: ER  CC: Seizures  HPI: Heather [name] is an 63 y.o. female with PMH of HTN. She is known to our service as of 2019 when she hadt RMCA stenosis with RMCA stroke, now s/p stent. She is on ASA + Brilinta + statin. She has mild residual left side weakness from prior stroke, but has returned to home with some assistance. Today family noticed pt was having a tonic/clonic type seizure for ~23mnute. Improved with rest. Per EMS and ER staff there was a left gaze as well as left side of face/arm seizure activity upon arrival to ER. She was able to communicate between seizures and return to normal mentation. The ongoing seizures did not stop until she was given Ativan. [place], daughter was brought back for further interview.  Daughter reports that the patient was not sick but there were 2 members of the family that had some fevers for a few days but did not have any other coronavirus related symptoms.  The patient did not have any fevers chills.  She not complain of any headaches.  On further questioning she did say that the patient was having some issues with her behavior where she was doing things that are uncharacteristic for example leaving the bathroom faucet running and not realizing which is unlike her.  This has been going on now for about a week. Other complaints-left arm pain that has been documented in the prior clinic visits as well. Last hospital visit February 2020 where it was felt that she had some twitching on the right side that lasted 10 minutes and then resolved.  EEG was unremarkable.  CT brain unremarkable for new process.  CT angiogram with about 50% in-stent stenosis, underwent diagnostic cerebral angiogram that showed 30 to 40% in-stent stenosis with excellent flow distally.  No other stroke TIA symptoms noted.  Seizures were considered but antiepileptics not initiated.  Today  in the ER, she had a focal seizure followed by secondary generalization witnessed by the ER staff. Her daughter also has a video of what happened at home with facial twitching involving left greater than right face as well as neck muscles, during which time she was noncommunicative.  In the emergency room: CL[place]eg for acute findings, only notable for moderate RMCA encephalomalacia.   Past Medical History:  Diagnosis Date  . Hypertension   . Stroke ([facility]    Past Surgical History Past Surgical History:  Procedure Laterality Date  . BREAST EXCISIONAL BIOPSY    . BREAST SURGERY     right breast  . CHOLECYSTECTOMY    . IR ANGIO INTRA EXTRACRAN SEL COM CAROTID INNOMINATE BILAT MOD SED  03/06/2018  . IR ANGIO INTRA EXTRACRAN SEL COM CAROTID INNOMINATE BILAT MOD SED  11/20/2018  . IR ANGIO VERTEBRAL SEL VERTEBRAL BILAT MOD SED  03/06/2018  . IR ANGIO VERTEBRAL SEL VERTEBRAL BILAT MOD SED  11/20/2018  . IR CT HEAD LTD  12/18/2017  . IR INTRA CRAN STENT  12/18/2017  . IR PERCUTANEOUS ART THROMBECTOMY/INFUSION INTRACRANIAL INC DIAG ANGIO  12/18/2017  . IR RADIOLOGIST EVAL & MGMT  01/31/2018  . RADIOLOGY WITH ANESTHESIA N/A 12/18/2017   Procedure: RADIOLOGY WITH ANESTHESIA;  Surgeon: DLuanne Bras MD;  [facility]  Service: Radiology;  Laterality: N/A;    Family History Family History  Problem Relation Age of Onset  . Diabetes Mother   . Pulmonary disease Mother   .  Heart disease Father   . Stroke Sister     Social History    reports that she quit smoking about 15 months ago. Her smoking use included cigarettes. She has a 15.00 pack-year smoking history. She has never used smokeless tobacco. She reports previous alcohol use. She reports that she does not use drugs.  Allergies No Known Allergies  Home Medications No current facility-administered medications on file prior to encounter.    Current Outpatient Medications on File Prior to Encounter  Medication Sig Dispense Refill  .  acetaminophen (TYLENOL) 500 MG tablet Take 1,000 mg by mouth every 8 (eight) hours as needed for moderate pain or headache.    Marland Kitchen aspirin 81 MG chewable tablet Chew 1 tablet (81 mg total) by mouth daily.    Marland Kitchen atorvastatin (LIPITOR) 10 MG tablet Take 1 tablet (10 mg total) by mouth daily at 6 PM. 90 tablet 3  . Blood Glucose Monitoring Suppl (TRUE METRIX METER) DEVI 1 kit by Does not apply route 4 (four) times daily. 1 Device 0  . glimepiride (AMARYL) 1 MG tablet TAKE 1 TABLET BY MOUTH DAILY WITH BREAKFAST. 30 tablet 2  . glucose blood (TRUE METRIX BLOOD GLUCOSE TEST) test strip Use as instructed 100 each 11  . polyethylene glycol powder (GLYCOLAX/MIRALAX) powder Take 17 g by mouth daily as needed. (Patient taking differently: Take 17 g by mouth daily as needed for moderate constipation. ) 3350 g 1  . sertraline (ZOLOFT) 50 MG tablet Take 1 tablet (50 mg total) by mouth daily. 30 tablet 2  . ticagrelor (BRILINTA) 90 MG TABS tablet Take 1 tablet (90 mg total) by mouth 2 (two) times daily. 60 tablet 3  . TRUEPLUS LANCETS 28G MISC 28 g by Does not apply route QID. 120 each 3     Hospital Medications    ROS: Review of systems obtained and pertinent positives documented in the HPI.   Physical Examination:  Vitals:   03/23/19 1400 03/23/19 1415 03/23/19 1445 03/23/19 1500  BP: 123/85 134/86 (!) 102/58 126/85  Pulse: 87 95 88 97  Resp: 19 17 (!) 22 19  SpO2: 100% 98% 99% 99%    General - overweight, min distress Heart - Regular rate and rhythm - no murmer Lungs - Clear to auscultation Abdomen - Soft - non tender Extremities - Distal pulses intact - no edema Skin - Warm and dry  Neurologic Examination:   Mental Status:  Drowsy, opens eyes to voice.  Follows all commands.  Cranial Nerves:  II-blinks to threat from both sides III/IV/VI-Pupils were equal and reacted. Extraocular movements were full.  V/VII-no facial numbness and no facial weakness.  VIII-hearing normal.  X-normal  speech and symmetrical palatal movement.  XII-midline tongue extension  Motor: Grossly symmetric antigravity in both upper and lower extremities. Tone and bulk:normal tone throughout; no atrophy noted Sensory: Intact to light touch in all extremities. Cerebellar: Normal finger to nose and heel to shin bilaterally. Gait: not tested   LABORATORY STUDIES:  Basic Metabolic Panel: Recent Labs  Lab 03/23/19 1250 03/23/19 1308  NA 141 141  K 4.4 4.8  CL 109 108  CO2 21*  --   GLUCOSE 141* 135*  BUN 9 13  CREATININE 0.62 0.40*  CALCIUM 9.1  --     Liver Function Tests: Recent Labs  Lab 03/23/19 1250  AST 27  ALT 27  ALKPHOS 71  BILITOT 0.7  PROT 6.9  ALBUMIN 3.8   CBC: Recent Labs  Lab 03/23/19  1250 03/23/19 1308  WBC 9.5  --   NEUTROABS 6.8  --   HGB 14.2 15.3*  HCT 43.8 45.0  MCV 92.8  --   PLT 268  --     CBG: Recent Labs  Lab 03/23/19 1150 03/23/19 1214  GLUCAP 78 120*   Coagulation Studies: Recent Labs    03/23/19 1250  LABPROT 12.6  INR 1.0   Lipid Panel:     Component Value Date/Time   CHOL 134 11/19/2018 0554   TRIG 69 11/19/2018 0554   HDL 33 (L) 11/19/2018 0554   CHOLHDL 4.1 11/19/2018 0554   VLDL 14 11/19/2018 0554   LDLCALC 87 11/19/2018 0554    HgbA1C:  Lab Results  Component Value Date   HGBA1C 5.4 11/19/2018    Alcohol Level:  Recent Labs  Lab 03/23/19 1250  ETH <10    Miscellaneous labs:  EKG  EKG  IMAGING: Ct Head Wo Contrast  Result Date: 03/23/2019 CLINICAL DATA:  Seizure activity. EXAM: CT HEAD WITHOUT CONTRAST TECHNIQUE: Contiguous axial images were obtained from the base of the skull through the vertex without intravenous contrast. COMPARISON:  11/18/2018 and MRI brain 11/18/2018 FINDINGS: Brain: Ventricles and cisterns are within normal. Minimal chronic ischemic microvascular disease. Old right MCA territory infarct. No mass, mass effect, shift of midline structures or acute hemorrhage. No evidence of  acute infarction. Vascular: No hyperdense vessel or unexpected calcification. Right MCA stent unchanged. Skull: Normal. Negative for fracture or focal lesion. Sinuses/Orbits: Orbits are within normal. Very minimal mucosal membrane thickening over the paranasal sinuses. Mastoid air cells are clear. Other: None. IMPRESSION: No acute findings. Mild chronic ischemic microvascular disease. Moderate size old right MCA territory infarct. Right MCA stent unchanged. Electronically Signed   By: Marin Olp M.D.   On: 03/23/2019 13:48   Dg Chest Port 1 View  Result Date: 03/23/2019 CLINICAL DATA:  Seizure activity this morning.  Left-sided weakness. EXAM: PORTABLE CHEST 1 VIEW COMPARISON:  12/18/2017 FINDINGS: Patient rotated to the right. Lungs are adequately inflated and otherwise clear. Cardiomediastinal silhouette and remainder the exam is unchanged. IMPRESSION: No active disease. Electronically Signed   By: Marin Olp M.D.   On: 03/23/2019 13:48     Assessment/Plan: 62yrold with h/o RMCA stroke, due to RMCA stenosis, s/p stenting. Now with new onset seizures. She was given Ativan and keppra load.   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 Will ck EEG and do infection wk up for underlying etiology # Encephalopathy- post ictal state as well as medication effect.  # RMCA stroke in 2019- s/p stent. Must continue DPAT at this time  RECS: I would recommend Obs under hospitalist Routine EEG Keppra 10021mIV load, then 500 mg BID MRI brain to rule out acute stroke NPO till cleared for safe swallow Urinalysis and chest x-ray  Seizure precautions  Heather [name], ARNP-C, ANVP-BC Pager: [phone] neurologist's note to follow  Attending Neurohospitalist Addendum Patient seen and examined with APP/Resident. Agree with the history and physical as documented above. Agree with the plan as documented, which I helped formulate. I have independently reviewed the chart, obtained  history, review of systems and examined the patient.I have personally reviewed pertinent head/neck/spine imaging (CT/MRI). Please feel free to call with any questions. --- AsAmie PortlandMD [facility] Pager: [phone] 7pm to 7am, please call on call as listed on AMION.

## 2019-03-23 NOTE — ED Notes (Signed)
Pt daughter to come to bedside

## 2019-03-23 NOTE — ED Notes (Signed)
Seizure resolved following ativan

## 2019-03-23 NOTE — ED Notes (Signed)
ED TO INPATIENT HANDOFF REPORT  ED Nurse Name and Phone #: [phone] [name] [name] Heather [name] 63 y.o. female Room/Bed: 045C/045C  Code Status   Code Status: Prior  Home/SNF/Other Home Patient oriented to: self, place, situation  Is this baseline?disoriented to time following seizure meds  Triage Complete: Triage complete  Chief Complaint stroke like sx  Triage Note Pt here for stroke   Pt has hx of stroke last year. Here today via EMS after family noticed pt was having seizure this morning around 9 am. Previous left side weakness. EMS states pt is VAN NEG. Pt arrived and actively seizing intermittently with left side deviation. Pt able to communicate between seizures. Interpretor provided. IV in hand in place. Daughter provided more hx over the phone.    Allergies No Known Allergies  Level of Care/Admitting Diagnosis ED Disposition    ED Disposition Condition Comment   Admit  Hospital Area: [facility] [[id number]]  Level of Care: Telemetry Cardiac [103]  I expect the patient will be discharged within 24 hours: Yes  LOW acuity---Tx typically complete <24 hrs---ACUTE conditions typically can be evaluated <24 hours---LABS likely to return to acceptable levels <24 hours---IS near functional baseline---EXPECTED to return to current living arrangement---NOT newly hypoxic: Meets criteria for 5C-Observation unit  Covid Evaluation: N/A  Diagnosis: Seizures (HCC) [299371]  Admitting Physician: Heather [name], ALI Bai.Lain  Attending Physician: Heather [name], ALI Bai.Lain  PT Class (Do Not Modify): Observation [104]  PT Acc Code (Do Not Modify): Observation [10022]       B Medical/Surgery History Past Medical History:  Diagnosis Date  . Hypertension   . Stroke [facility])    Past Surgical History:  Procedure Laterality Date  . BREAST EXCISIONAL BIOPSY    . BREAST SURGERY     right breast  . CHOLECYSTECTOMY    . IR ANGIO INTRA EXTRACRAN SEL COM CAROTID INNOMINATE  BILAT MOD SED  03/06/2018  . IR ANGIO INTRA EXTRACRAN SEL COM CAROTID INNOMINATE BILAT MOD SED  11/20/2018  . IR ANGIO VERTEBRAL SEL VERTEBRAL BILAT MOD SED  03/06/2018  . IR ANGIO VERTEBRAL SEL VERTEBRAL BILAT MOD SED  11/20/2018  . IR CT HEAD LTD  12/18/2017  . IR INTRA CRAN STENT  12/18/2017  . IR PERCUTANEOUS ART THROMBECTOMY/INFUSION INTRACRANIAL INC DIAG ANGIO  12/18/2017  . IR RADIOLOGIST EVAL & MGMT  01/31/2018  . RADIOLOGY WITH ANESTHESIA N/A 12/18/2017   Procedure: RADIOLOGY WITH ANESTHESIA;  Surgeon: Heather Cotton, MD;  Location: [facility] OR;  Service: Radiology;  Laterality: N/A;     A IV Location/Drains/Wounds Patient Lines/Drains/Airways Status   Active Line/Drains/Airways    Name:   Placement date:   Placement time:   Site:   Days:   Peripheral IV 11/18/18 Left;Upper Antecubital   11/18/18    1427    Antecubital   125   Peripheral IV 03/23/19 Left Antecubital   03/23/19    1254    Antecubital   less than 1   Peripheral IV 03/23/19 Right Hand   03/23/19    1254    Hand   less than 1   Incision (Closed) 12/18/17 Groin Right   12/18/17    2249     460   Incision (Closed) 03/06/18 Groin Right   03/06/18    0936     382   Wound / Incision (Open or Dehisced) 11/20/18 Puncture Groin Right   11/20/18    0949    Groin   123  Intake/Output Last 24 hours No intake or output data in the 24 hours ending 03/23/19 1905  Labs/Imaging Results for orders placed or performed during the hospital encounter of 03/23/19 (from the past 48 hour(s))  CBG monitoring, ED     Status: None   Collection Time: 03/23/19 11:50 AM  Result Value Ref Range   Glucose-Capillary 78 70 - 99 mg/dL  CBG monitoring, ED     Status: Abnormal   Collection Time: 03/23/19 12:14 PM  Result Value Ref Range   Glucose-Capillary 120 (H) 70 - 99 mg/dL  Ethanol     Status: None   Collection Time: 03/23/19 12:50 PM  Result Value Ref Range   Alcohol, Ethyl (B) <10 <10 mg/dL    Comment: (NOTE) Lowest detectable limit  for serum alcohol is 10 mg/dL. For medical purposes only. Performed at [facility], [address] [place], [place] [address]   Protime-INR     Status: None   Collection Time: 03/23/19 12:50 PM  Result Value Ref Range   Prothrombin Time 12.6 11.4 - 15.2 seconds   INR 1.0 0.8 - 1.2    Comment: (NOTE) INR goal varies based on device and disease states. Performed at [facility], [address] [place], [place] [address]   APTT     Status: Abnormal   Collection Time: 03/23/19 12:50 PM  Result Value Ref Range   aPTT 20 (L) 24 - 36 seconds    Comment: Performed at [facility], [address] [place], [place] [address]  CBC     Status: None   Collection Time: 03/23/19 12:50 PM  Result Value Ref Range   WBC 9.5 4.0 - 10.5 K/uL   RBC 4.72 3.87 - 5.11 MIL/uL   Hemoglobin 14.2 12.0 - 15.0 g/dL   HCT 91.443.8 78.236.0 - 95.646.0 %   MCV 92.8 80.0 - 100.0 fL   MCH 30.1 26.0 - 34.0 pg   MCHC 32.4 30.0 - 36.0 g/dL   RDW 21.313.3 08.611.5 - 57.815.5 %   Platelets 268 150 - 400 K/uL   nRBC 0.0 0.0 - 0.2 %    Comment: Performed at [facility], [address] [place], [place] [address]  Differential     Status: None   Collection Time: 03/23/19 12:50 PM  Result Value Ref Range   Neutrophils Relative % 72 %   Neutro Abs 6.8 1.7 - 7.7 K/uL   Lymphocytes Relative 22 %   Lymphs Abs 2.1 0.7 - 4.0 K/uL   Monocytes Relative 5 %   Monocytes Absolute 0.5 0.1 - 1.0 K/uL   Eosinophils Relative 1 %   Eosinophils Absolute 0.1 0.0 - 0.5 K/uL   Basophils Relative 0 %   Basophils Absolute 0.0 0.0 - 0.1 K/uL   Immature Granulocytes 0 %   Abs Immature Granulocytes 0.01 0.00 - 0.07 K/uL    Comment: Performed at [facility], [address] [place], [place] [address]  Comprehensive metabolic panel     Status: Abnormal   Collection Time: 03/23/19 12:50 PM  Result Value Ref Range   Sodium 141 135 - 145 mmol/L   Potassium 4.4 3.5 - 5.1 mmol/L   Chloride 109 98 - 111 mmol/L   CO2 21 (L) 22  - 32 mmol/L   Glucose, Bld 141 (H) 70 - 99 mg/dL   BUN 9 8 - 23 mg/dL   Creatinine, Ser 1.320.62 0.44 - 1.00 mg/dL   Calcium 9.1  8.9 - 10.3 mg/dL   Total Protein 6.9 6.5 - 8.1 g/dL   Albumin 3.8 3.5 - 5.0 g/dL   AST 27 15 - 41 U/L   ALT 27 0 - 44 U/L   Alkaline Phosphatase 71 38 - 126 U/L   Total Bilirubin 0.7 0.3 - 1.2 mg/dL   GFR calc non Af Amer >60 >60 mL/min   GFR calc Af Amer >60 >60 mL/min   Anion gap 11 5 - 15    Comment: Performed at [facility] [address] [place], [place] [address]  I-stat chem 8, ED     Status: Abnormal   Collection Time: 03/23/19  1:08 PM  Result Value Ref Range   Sodium 141 135 - 145 mmol/L   Potassium 4.8 3.5 - 5.1 mmol/L   Chloride 108 98 - 111 mmol/L   BUN 13 8 - 23 mg/dL   Creatinine, Ser 0.40 (L) 0.44 - 1.00 mg/dL   Glucose, Bld 135 (H) 70 - 99 mg/dL   Calcium, Ion 1.15 1.15 - 1.40 mmol/L   TCO2 25 22 - 32 mmol/L   Hemoglobin 15.3 (H) 12.0 - 15.0 g/dL   HCT 45.0 36.0 - 46.0 %  SARS Coronavirus 2 (CEPHEID - Performed in [place] hospital lab), Hosp Order     Status: None   Collection Time: 03/23/19  1:42 PM   Specimen: Nasopharyngeal Swab  Result Value Ref Range   SARS Coronavirus 2 NEGATIVE NEGATIVE    Comment: (NOTE) If result is NEGATIVE SARS-CoV-2 target nucleic acids are NOT DETECTED. The SARS-CoV-2 RNA is generally detectable in upper and lower  respiratory specimens during the acute phase of infection. The lowest  concentration of SARS-CoV-2 viral copies this assay can detect is 250  copies / mL. A negative result does not preclude SARS-CoV-2 infection  and should not be used as the sole basis for treatment or other  patient management decisions.  A negative result may occur with  improper specimen collection / handling, submission of specimen other  than nasopharyngeal swab, presence of viral mutation(s) within the  areas targeted by this assay, and inadequate number of viral copies  (<250 copies / mL). A negative  result must be combined with clinical  observations, patient history, and epidemiological information. If result is POSITIVE SARS-CoV-2 target nucleic acids are DETECTED. The SARS-CoV-2 RNA is generally detectable in upper and lower  respiratory specimens dur ing the acute phase of infection.  Positive  results are indicative of active infection with SARS-CoV-2.  Clinical  correlation with patient history and other diagnostic information is  necessary to determine patient infection status.  Positive results do  not rule out bacterial infection or co-infection with other viruses. If result is PRESUMPTIVE POSTIVE SARS-CoV-2 nucleic acids MAY BE PRESENT.   A presumptive positive result was obtained on the submitted specimen  and confirmed on repeat testing.  While 2019 novel coronavirus  (SARS-CoV-2) nucleic acids may be present in the submitted sample  additional confirmatory testing may be necessary for epidemiological  and / or clinical management purposes  to differentiate between  SARS-CoV-2 and other Sarbecovirus currently known to infect humans.  If clinically indicated additional testing with an alternate test  methodology [id number]) is advised. The SARS-CoV-2 RNA is generally  detectable in upper and lower respiratory sp ecimens during the acute  phase of infection. The expected result is Negative. Fact Sheet for Patients:  StrictlyIdeas.no Fact Sheet for Healthcare Providers: BankingDealers.co.za This test is not  yet approved or cleared by the [place] and has been authorized for detection and/or diagnosis of SARS-CoV-2 by FDA under an Emergency Use Authorization (EUA).  This EUA will remain in effect (meaning this test can be used) for the duration of the COVID-19 declaration under Section 564(b)(1) of the Act, 21 U.S.C. section 360bbb-3(b)(1), unless the authorization is terminated or revoked sooner. Performed at [facility], [address] [place], [place] [address]    Ct Head Wo Contrast  Result Date: 03/23/2019 CLINICAL DATA:  Seizure activity. EXAM: CT HEAD WITHOUT CONTRAST TECHNIQUE: Contiguous axial images were obtained from the base of the skull through the vertex without intravenous contrast. COMPARISON:  11/18/2018 and MRI brain 11/18/2018 FINDINGS: Brain: Ventricles and cisterns are within normal. Minimal chronic ischemic microvascular disease. Old right MCA territory infarct. No mass, mass effect, shift of midline structures or acute hemorrhage. No evidence of acute infarction. Vascular: No hyperdense vessel or unexpected calcification. Right MCA stent unchanged. Skull: Normal. Negative for fracture or focal lesion. Sinuses/Orbits: Orbits are within normal. Very minimal mucosal membrane thickening over the paranasal sinuses. Mastoid air cells are clear. Other: None. IMPRESSION: No acute findings. Mild chronic ischemic microvascular disease. Moderate size old right MCA territory infarct. Right MCA stent unchanged. Electronically Signed   By: Elberta Fortis M.D.   On: 03/23/2019 13:48   Dg Chest Port 1 View  Result Date: 03/23/2019 CLINICAL DATA:  Seizure activity this morning.  Left-sided weakness. EXAM: PORTABLE CHEST 1 VIEW COMPARISON:  12/18/2017 FINDINGS: Patient rotated to the right. Lungs are adequately inflated and otherwise clear. Cardiomediastinal silhouette and remainder the exam is unchanged. IMPRESSION: No active disease. Electronically Signed   By: Elberta Fortis M.D.   On: 03/23/2019 13:48    Pending Labs Unresulted Labs (From admission, onward)    Start     Ordered   03/23/19 1159  Urine rapid drug screen (hosp performed)  ONCE - STAT,   STAT     03/23/19 1200   03/23/19 1159  Urinalysis, Routine w reflex microscopic  ONCE - STAT,   STAT     03/23/19 1200   Signed and Held  HIV antibody (Routine Testing)  Once,   R     Signed and Held   Signed and Held  Hemoglobin A1c  Tomorrow  morning,   R     Signed and Held   Signed and Held  Lipid panel  Tomorrow morning,   R    Comments: Fasting    Signed and Held          Vitals/Pain Today's Vitals   03/23/19 1630 03/23/19 1700 03/23/19 1730 03/23/19 1745  BP:   (!) 123/94 119/86  Pulse: 82 79 89 84  Resp: 19 15 16 16   SpO2: 100% 98% 100% 98%  PainSc:        Isolation Precautions No active isolations  Medications Medications  sodium chloride 0.9 % bolus 500 mL (0 mLs Intravenous Stopped 03/23/19 1505)    Followed by  0.9 %  sodium chloride infusion (has no administration in time range)  levETIRAcetam (KEPPRA) tablet 1,000 mg (has no administration in time range)  LORazepam (ATIVAN) injection 2 mg (2 mg Intravenous Given 03/23/19 1200)  levETIRAcetam (KEPPRA) IVPB 1000 mg/100 mL premix (0 mg Intravenous Stopped 03/23/19 1234)  LORazepam (ATIVAN) injection 1 mg (1 mg Intravenous Given 03/23/19 1230)    Mobility walks Low fall risk   Focused Assessments Neuro Assessment Handoff:  NIH Stroke Scale ( + Modified Stroke Scale Criteria)  LOC Questions (1b. )   +: Answers both questions correctly(pt eyes deviate to left side and left arm weakness and drawing arm up. UTA most assessment due to actively seizing) LOC Commands (1c. )   + : Performs one task correctly Best Gaze (2. )  +: Partial gaze palsy Visual (3. )  +: (UTA) Motor Arm, Left (5a. )   +: Drift Motor Arm, Right (5b. )   +: No drift Motor Leg, Right (6b. )   +: No drift Sensory (8. )   +: (UTA) Best Language (9. )   +: Mild-to-moderate aphasia Extinction/Inattention (11.)   +: (UTA)          R Recommendations: See Admitting Provider Note  Report given to:   Additional Notes:

## 2019-03-23 NOTE — ED Notes (Signed)
Attempted report x 3.  

## 2019-03-23 NOTE — ED Notes (Signed)
Patient transported to CT 

## 2019-03-23 NOTE — ED Notes (Signed)
Pt daughter at bedside

## 2019-03-23 NOTE — ED Notes (Signed)
PA on phone with daughter

## 2019-03-23 NOTE — ED Notes (Signed)
purewick placed on patient.  

## 2019-03-23 NOTE — ED Triage Notes (Signed)
Pt has hx of stroke last year. Here today via EMS after family noticed pt was having seizure this morning around 9 am. Previous left side weakness. EMS states pt is VAN NEG. Pt arrived and actively seizing intermittently with left side deviation. Pt able to communicate between seizures. Interpretor provided. IV in hand in place. Daughter provided more hx over the phone.

## 2019-03-23 NOTE — ED Triage Notes (Signed)
Pt here for stroke

## 2019-03-23 NOTE — ED Notes (Signed)
Pt having clonic tonic seizure while pushing IV ativan. EDP at bedside.

## 2019-03-23 NOTE — ED Provider Notes (Signed)
[facility] EMERGENCY DEPARTMENT Provider Note   CSN: [id number] Arrival date & time: 03/23/19  1141    History   Chief Complaint Chief Complaint  Patient presents with  . stroke like symptoms    HPI Heather [name] is a 63 y.o. female with a hx of CVA (cerebral artery embolism, March 2019), hypertension, non-insulin-dependent diabetes presents to the Emergency Department via EMS with seizure activity.  Level 5 caveat for altered mental status.  EMS reports persistent focal seizure activity since 8 AM with left-sided gaze.  Was last seen well at 10 PM last night before going to bed.  Additional history obtained from daughter via the phone.  Daughter reports that patient had a tonic-clonic seizure lasting approximately 1 minute around 9 AM occurred from sleep.  She reports this resolved completely and patient took a nap.  Daughter reports that patient then had a second seizure prompting the call to EMS which has been persistent throughout their transport here to the emergency department.  Daughter reports a history of "epilepsy" for which patient has never been treated with medication.  Daughter reports no seizures in the last 30 years.  Daughter reports granddaughter who lives in the home has had fever for 24 hours but no other symptoms.  No other known sick contacts.     The history is provided by the patient, medical records and a relative. The history is limited by the condition of the patient. No language interpreter was used.    Past Medical History:  Diagnosis Date  . Hypertension   . Stroke [facility])     Patient Active Problem List   Diagnosis Date Noted  . TIA (transient ischemic attack) 11/18/2018  . Depression 02/21/2018  . Essential hypertension 02/21/2018  . Cerebrovascular accident (CVA) due to embolism of right middle cerebral artery ([place]) 02/21/2018  . Diabetes mellitus type 2 in obese ([place]) 12/20/2017  . Right middle cerebral artery stroke ([place])  12/19/2017  . Left hemiparesis ([place])   . Dysphagia, oropharyngeal   . Middle cerebral artery embolism, 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    Past Surgical History:  Procedure Laterality Date  . BREAST EXCISIONAL BIOPSY    . BREAST SURGERY     right breast  . CHOLECYSTECTOMY    . IR ANGIO INTRA EXTRACRAN SEL COM CAROTID INNOMINATE BILAT MOD SED  03/06/2018  . IR ANGIO INTRA EXTRACRAN SEL COM CAROTID INNOMINATE BILAT MOD SED  11/20/2018  . IR ANGIO VERTEBRAL SEL VERTEBRAL BILAT MOD SED  03/06/2018  . IR ANGIO VERTEBRAL SEL VERTEBRAL BILAT MOD SED  11/20/2018  . IR CT HEAD LTD  12/18/2017  . IR INTRA CRAN STENT  12/18/2017  . IR PERCUTANEOUS ART THROMBECTOMY/INFUSION INTRACRANIAL INC DIAG ANGIO  12/18/2017  . IR RADIOLOGIST EVAL & MGMT  01/31/2018  . RADIOLOGY WITH ANESTHESIA N/A 12/18/2017   Procedure: RADIOLOGY WITH ANESTHESIA;  Surgeon: Luanne Bras, MD;  Location: [facility];  Service: Radiology;  Laterality: N/A;     OB History    Gravida  1   Para      Term      Preterm      AB      Living  1     SAB      TAB      Ectopic      Multiple      Live Births  1            Home Medications    Prior to Admission medications  Medication Sig Start Date End Date Taking? Authorizing Provider  acetaminophen (TYLENOL) 500 MG tablet Take 1,000 mg by mouth every 8 (eight) hours as needed for moderate pain or headache.    [provider]  aspirin 81 MG chewable tablet Chew 1 tablet (81 mg total) by mouth daily. 12/27/17   Angiulli, Lavon Paganini, PA-C  atorvastatin (LIPITOR) 10 MG tablet Take 1 tablet (10 mg total) by mouth daily at 6 PM. 02/05/19   Ladell Pier, MD  Blood Glucose Monitoring Suppl (TRUE METRIX METER) DEVI 1 kit by Does not apply route 4 (four) times daily. 01/12/18   Brayton Caves, PA-C  glimepiride (AMARYL) 1 MG tablet TAKE 1 TABLET BY MOUTH DAILY WITH BREAKFAST. 01/22/19   Ladell Pier, MD  glucose blood (TRUE METRIX BLOOD GLUCOSE TEST) test strip Use as  instructed 01/25/19   Ladell Pier, MD  polyethylene glycol powder (GLYCOLAX/MIRALAX) powder Take 17 g by mouth daily as needed. Patient taking differently: Take 17 g by mouth daily as needed for moderate constipation.  08/09/18   Ladell Pier, MD  sertraline (ZOLOFT) 50 MG tablet Take 1 tablet (50 mg total) by mouth daily. 10/23/18   Ladell Pier, MD  ticagrelor (BRILINTA) 90 MG TABS tablet Take 1 tablet (90 mg total) by mouth 2 (two) times daily. 02/05/19   Ladell Pier, MD  TRUEPLUS LANCETS 28G MISC 28 g by Does not apply route QID. 01/12/18   Brayton Caves, PA-C    Family History Family History  Problem Relation Age of Onset  . Diabetes Mother   . Pulmonary disease Mother   . Heart disease Father   . Stroke Sister     Social History Social History   Tobacco Use  . Smoking status: Former Smoker    Packs/day: 0.50    Years: 30.00    Pack years: 15.00    Types: Cigarettes    Quit date: 12/04/2017    Years since quitting: 1.2  . Smokeless tobacco: Never Used  Substance Use Topics  . Alcohol use: Not Currently    Comment: weekly  . Drug use: No     Allergies   Patient has no known allergies.   Review of Systems Review of Systems  Unable to perform ROS: Mental status change     Physical Exam Updated Vital Signs BP (!) 150/80   Pulse (!) 106   Resp 20   SpO2 100%   Physical Exam Vitals signs and nursing note reviewed.  Constitutional:      General: She is in acute distress.     Appearance: She is not diaphoretic.  HENT:     Head: Normocephalic.     Comments: symmetric twitching of the left side of the face bilateral eyes    Nose: Nose normal.     Mouth/Throat:     Mouth: Mucous membranes are moist.  Eyes:     General: No scleral icterus.    Conjunctiva/sclera: Conjunctivae normal.     Comments: Left gaze deviation  Cardiovascular:     Rate and Rhythm: Regular rhythm. Tachycardia present.     Pulses: Normal pulses.          Radial  pulses are 2+ on the right side and 2+ on the left side.       Dorsalis pedis pulses are 2+ on the right side and 2+ on the left side.  Pulmonary:     Effort: No tachypnea, accessory muscle usage, prolonged  expiration, respiratory distress or retractions.     Breath sounds: No stridor.     Comments: Equal chest rise. No increased work of breathing. Abdominal:     General: There is no distension.     Palpations: Abdomen is soft.  Musculoskeletal:     Comments: Left upper extremity with contracture and seizure activity.  Skin:    General: Skin is warm and dry.     Capillary Refill: Capillary refill takes less than 2 seconds.  Neurological:     Mental Status: She is alert.     GCS: GCS eye subscore is 4. GCS verbal subscore is 5. GCS motor subscore is 6.     Comments: Speech is garbled.  Patient is able to answer some questions.  Follows some one-step commands.  Psychiatric:        Mood and Affect: Mood normal.      ED Treatments / Results  Labs (all labs ordered are listed, but only abnormal results are displayed) Labs Reviewed  APTT - Abnormal; Notable for the following components:      Result Value   aPTT 20 (*)    All other components within normal limits  COMPREHENSIVE METABOLIC PANEL - Abnormal; Notable for the following components:   CO2 21 (*)    Glucose, Bld 141 (*)    All other components within normal limits  I-STAT CHEM 8, ED - Abnormal; Notable for the following components:   Creatinine, Ser 0.40 (*)    Glucose, Bld 135 (*)    Hemoglobin 15.3 (*)    All other components within normal limits  CBG MONITORING, ED - Abnormal; Notable for the following components:   Glucose-Capillary 120 (*)    All other components within normal limits  SARS CORONAVIRUS 2 (HOSPITAL ORDER, [facility])  ETHANOL  PROTIME-INR  CBC  DIFFERENTIAL  RAPID URINE DRUG SCREEN, HOSP PERFORMED  URINALYSIS, ROUTINE W REFLEX MICROSCOPIC  CBG MONITORING, ED     EKG EKG Interpretation  Date/Time:  Saturday March 23 2019 12:01:39 EDT Ventricular Rate:  87 PR Interval:    QRS Duration: 111 QT Interval:  381 QTC Calculation: 459 R Axis:   -37 Text Interpretation:  Sinus rhythm Incomplete RBBB and LAFB Low voltage, precordial leads RSR' in V1 or V2, right VCD or RVH Consider anterior infarct No significant change was found compared to 11/18/18 Confirmed by Quintella Reichert [phone]) on 03/23/2019 12:28:56 PM   Radiology Ct Head Wo Contrast  Result Date: 03/23/2019 CLINICAL DATA:  Seizure activity. EXAM: CT HEAD WITHOUT CONTRAST TECHNIQUE: Contiguous axial images were obtained from the base of the skull through the vertex without intravenous contrast. COMPARISON:  11/18/2018 and MRI brain 11/18/2018 FINDINGS: Brain: Ventricles and cisterns are within normal. Minimal chronic ischemic microvascular disease. Old right MCA territory infarct. No mass, mass effect, shift of midline structures or acute hemorrhage. No evidence of acute infarction. Vascular: No hyperdense vessel or unexpected calcification. Right MCA stent unchanged. Skull: Normal. Negative for fracture or focal lesion. Sinuses/Orbits: Orbits are within normal. Very minimal mucosal membrane thickening over the paranasal sinuses. Mastoid air cells are clear. Other: None. IMPRESSION: No acute findings. Mild chronic ischemic microvascular disease. Moderate size old right MCA territory infarct. Right MCA stent unchanged. Electronically Signed   By: Marin Olp M.D.   On: 03/23/2019 13:48   Dg Chest Port 1 View  Result Date: 03/23/2019 CLINICAL DATA:  Seizure activity this morning.  Left-sided weakness. EXAM: PORTABLE CHEST 1 VIEW COMPARISON:  12/18/2017 FINDINGS: Patient rotated to the right. Lungs are adequately inflated and otherwise clear. Cardiomediastinal silhouette and remainder the exam is unchanged. IMPRESSION: No active disease. Electronically Signed   By: Marin Olp M.D.   On: 03/23/2019 13:48     Procedures .Critical Care Performed by: Abigail Butts, PA-C Authorized by: Abigail Butts, PA-C   Critical care provider statement:    Critical care time (minutes):  60   Critical care time was exclusive of:  Separately billable procedures and treating other patients and teaching time   Critical care was necessary to treat or prevent imminent or life-threatening deterioration of the following conditions:  CNS failure or compromise   Critical care was time spent personally by me on the following activities:  Discussions with consultants, evaluation of patient's response to treatment, examination of patient, ordering and performing treatments and interventions, ordering and review of laboratory studies, ordering and review of radiographic studies, pulse oximetry, re-evaluation of patient's condition, obtaining history from patient or surrogate, review of old charts, blood draw for specimens, development of treatment plan with patient or surrogate and vascular access procedures   I assumed direction of critical care for this patient from another provider in my specialty: no    Ultrasound ED Peripheral IV (Provider)  Date/Time: 03/23/2019 2:43 PM Performed by: Abigail Butts, PA-C Authorized by: Abigail Butts, PA-C   Procedure details:    Indications: poor IV access     Skin Prep: chlorhexidine gluconate     Location:  Left AC   Angiocath:  20 G   Bedside Ultrasound Guided: Yes     Images: not archived     Patient tolerated procedure without complications: Yes     Dressing applied: Yes     (including critical care time)  Medications Ordered in ED Medications  sodium chloride 0.9 % bolus 500 mL (0 mLs Intravenous Stopped 03/23/19 1505)    Followed by  0.9 %  sodium chloride infusion (has no administration in time range)  levETIRAcetam (KEPPRA) tablet 1,000 mg (has no administration in time range)  LORazepam (ATIVAN) injection 2 mg (2 mg Intravenous Given  03/23/19 1200)  levETIRAcetam (KEPPRA) IVPB 1000 mg/100 mL premix (0 mg Intravenous Stopped 03/23/19 1234)  LORazepam (ATIVAN) injection 1 mg (1 mg Intravenous Given 03/23/19 1230)     Initial Impression / Assessment and Plan / ED Course  I have reviewed the triage vital signs and the nursing notes.  Pertinent labs & imaging results that were available during my care of the patient were reviewed by me and considered in my medical decision making (see chart for details).  Clinical Course as of Mar 22 1710  Sat Mar 23, 2019  1443 Discussed with Dr. Rory Percy who wishes to have family at bedside. I discussed with pt's daughter, [name], who is willing to come to bedside.   [HM]  1541 Electrolytes are within normal limits  Sodium: 141 [HM]  1541 Hypotension has resolved without intervention.  BP: 121/69 [HM]  1542 No anemia  Hemoglobin: 14.2 [HM]  1542 No evidence of pulmonary edema or pneumonia.  DG Chest Port 1 View [HM]  1543 Moderate size old right MCA territory infarct. Right MCA stent unchanged.  No ICH.  I personally evaluated the images.     CT HEAD WO CONTRAST [HM]  1548 Dr. Rory Percy recommends [facility] an overnight observation with Neuro Consult.   [HM]  1709 Discussed with Dr. Elease Etienne who will admit.   [HM]    Clinical Course User  Index [HM] Jodell Weitman, Jarrett Soho, PA-C        Presents with focal seizures ongoing at minimum 1 hour concern for ongoing seizures for longer than that.  Patient able to answers some questions however speech is garbled.  Ativan and Keppra ordered however just prior to Ativan administration, patient seizure became tonic-clonic.  Patient had approximately 30 seconds of tonic-clonic motion and respiratory depression.  2 mg of IV Ativan administered and Keppra load given.  Afterwards, patient remains paced ictal, answers no questions but does withdraw from pain. US guided PIV placed by myself due to difficult IV access and inability to draw blood.    The patient was discussed with and seen by Dr. Ralene Bathe who agrees with the treatment plan.  3:44 PM No additional seizure-like activity at this time.  Patient remains sedated. Patient evaluated by Dr. Rory Percy.  Will admit to hospitalist service for further seizure management, EEG and observation.  Final Clinical Impressions(s) / ED Diagnoses   Final diagnoses:  Seizure [facility])  H/O: CVA (cerebrovascular accident)    ED Discharge Orders    None       Alyxandra Tenbrink, Gwenlyn Perking 03/23/19 1712    Quintella Reichert, MD 03/24/19 1100

## 2019-03-23 NOTE — ED Notes (Signed)
Neurology at bedside talking with patient and daughter

## 2019-03-23 NOTE — ED Notes (Signed)
Attempted to call family with no response

## 2019-03-23 NOTE — H&P (Signed)
[facility]                                                                                    Patient Demographics  Heather [name], is a 63 y.o. female  CSN: [id number]  MRN: [id number]  DOB - 20-Sep-1956  Admit Date - 03/23/2019  Outpatient Primary MD for the patient is Ladell Pier, MD   With History of -  Past Medical History:  Diagnosis Date  . Hypertension   . Stroke [facility])       Past Surgical History:  Procedure Laterality Date  . BREAST EXCISIONAL BIOPSY    . BREAST SURGERY     right breast  . CHOLECYSTECTOMY    . IR ANGIO INTRA EXTRACRAN SEL COM CAROTID INNOMINATE BILAT MOD SED  03/06/2018  . IR ANGIO INTRA EXTRACRAN SEL COM CAROTID INNOMINATE BILAT MOD SED  11/20/2018  . IR ANGIO VERTEBRAL SEL VERTEBRAL BILAT MOD SED  03/06/2018  . IR ANGIO VERTEBRAL SEL VERTEBRAL BILAT MOD SED  11/20/2018  . IR CT HEAD LTD  12/18/2017  . IR INTRA CRAN STENT  12/18/2017  . IR PERCUTANEOUS ART THROMBECTOMY/INFUSION INTRACRANIAL INC DIAG ANGIO  12/18/2017  . IR RADIOLOGIST EVAL & MGMT  01/31/2018  . RADIOLOGY WITH ANESTHESIA N/A 12/18/2017   Procedure: RADIOLOGY WITH ANESTHESIA;  Surgeon: Luanne Bras, MD;  Location: [place];  Service: Radiology;  Laterality: N/A;    in for   Chief Complaint  Patient presents with  . stroke like symptoms     HPI  Heather [name]  is a 63 y.o. female, with past medical history significant for hypertension and right MCA stenosis and stroke status post stent on aspirin, Brilinta and statin around 1 year ago on 03/2018 with residual left-sided weakness and R MCA encephalomalacia, presenting with new onset seizures.  The patient noticed the abnormal behavior for the last 1 week until today when she was noted to have tonic-clonic seizure activity at home and was brought to the emergency room where she had another episode.  It started with facial twitching and left arm being tense and continued to full tonic-clonic seizure.  Patient continued  to have seizures in the emergency room until she received Ativan.  Patient does not have any seizure disorder. Most of the history was taken from ER note G on February 2020 which was negative after she noticed some twitching on the right side.  She had CT angiogram with 50% in-stent stenosis.  Neurology saw the patient in consult and started patient on Keppra and advised EEG in a.m. consult reviewed.    Review of Systems    In addition to the HPI above,  No Fever-chills,  No changes with Vision or hearing, No problems swallowing food or Liquids, No Chest pain, Cough or Shortness of Breath, No Abdominal pain, No Nausea or Vommitting, Bowel movements are regular, No Blood in stool or Urine, No dysuria, No new skin rashes or bruises, No new joints pains-aches,  No recent weight gain or loss, No polyuria, polydypsia or polyphagia, No significant Mental Stressors.  A full 10 point Review of Systems was done, except as stated above, all other  Review of Systems were negative.   Social History Social History   Tobacco Use  . Smoking status: Former Smoker    Packs/day: 0.50    Years: 30.00    Pack years: 15.00    Types: Cigarettes    Quit date: 12/04/2017    Years since quitting: 1.2  . Smokeless tobacco: Never Used  Substance Use Topics  . Alcohol use: Not Currently    Comment: weekly     Family History Family History  Problem Relation Age of Onset  . Diabetes Mother   . Pulmonary disease Mother   . Heart disease Father   . Stroke Sister      Prior to Admission medications   Medication Sig Start Date End Date Taking? Authorizing Provider  acetaminophen (TYLENOL) 500 MG tablet Take 1,000 mg by mouth every 8 (eight) hours as needed for moderate pain or headache.    [provider]  aspirin 81 MG chewable tablet Chew 1 tablet (81 mg total) by mouth daily. 12/27/17   Angiulli, Lavon Paganini, PA-C  atorvastatin (LIPITOR) 10 MG tablet Take 1 tablet (10 mg total) by mouth  daily at 6 PM. 02/05/19   Ladell Pier, MD  Blood Glucose Monitoring Suppl (TRUE METRIX METER) DEVI 1 kit by Does not apply route 4 (four) times daily. 01/12/18   Brayton Caves, PA-C  glimepiride (AMARYL) 1 MG tablet TAKE 1 TABLET BY MOUTH DAILY WITH BREAKFAST. 01/22/19   Ladell Pier, MD  glucose blood (TRUE METRIX BLOOD GLUCOSE TEST) test strip Use as instructed 01/25/19   Ladell Pier, MD  polyethylene glycol powder (GLYCOLAX/MIRALAX) powder Take 17 g by mouth daily as needed. Patient taking differently: Take 17 g by mouth daily as needed for moderate constipation.  08/09/18   Ladell Pier, MD  sertraline (ZOLOFT) 50 MG tablet Take 1 tablet (50 mg total) by mouth daily. 10/23/18   Ladell Pier, MD  ticagrelor (BRILINTA) 90 MG TABS tablet Take 1 tablet (90 mg total) by mouth 2 (two) times daily. 02/05/19   Ladell Pier, MD  TRUEPLUS LANCETS 28G MISC 28 g by Does not apply route QID. 01/12/18   Brayton Caves, PA-C    No Known Allergies  Physical Exam  Vitals  Blood pressure 121/69, pulse 79, resp. rate 15, SpO2 98 %.  General appearance: Well-developed, well-nourished, drowsy at this time HEENT: Right facial deviation Neck supple scars of old surgery well-healed Chest clear and resonant Heart normal S1-S2, no murmurs gallops or rubs Abdomen soft, nontender bowel sounds are present, nontender Extremities no clubbing cyanosis or edema Neuro: Facial deviation noted with tongue deviated to the left.  Pupils equal reactive to light. Motor power symmetrical and equal strength Skin no rashes or ulcers  Data Review  CBC Recent Labs  Lab 03/23/19 1250 03/23/19 1308  WBC 9.5  --   HGB 14.2 15.3*  HCT 43.8 45.0  PLT 268  --   MCV 92.8  --   MCH 30.1  --   MCHC 32.4  --   RDW 13.3  --   LYMPHSABS 2.1  --   MONOABS 0.5  --   EOSABS 0.1  --   BASOSABS 0.0  --     ------------------------------------------------------------------------------------------------------------------  Chemistries  Recent Labs  Lab 03/23/19 1250 03/23/19 1308  NA 141 141  K 4.4 4.8  CL 109 108  CO2 21*  --   GLUCOSE 141* 135*  BUN 9 13  CREATININE 0.62  0.40*  CALCIUM 9.1  --   AST 27  --   ALT 27  --   ALKPHOS 71  --   BILITOT 0.7  --    ------------------------------------------------------------------------------------------------------------------ CrCl cannot be calculated (Unknown ideal weight.). ------------------------------------------------------------------------------------------------------------------ No results for input(s): TSH, T4TOTAL, T3FREE, THYROIDAB in the last 72 hours.  Invalid input(s): FREET3   Coagulation profile Recent Labs  Lab 03/23/19 1250  INR 1.0   ------------------------------------------------------------------------------------------------------------------- No results for input(s): DDIMER in the last 72 hours. -------------------------------------------------------------------------------------------------------------------  Cardiac Enzymes No results for input(s): CKMB, TROPONINI, MYOGLOBIN in the last 168 hours.  Invalid input(s): CK ------------------------------------------------------------------------------------------------------------------ Invalid input(s): POCBNP   ---------------------------------------------------------------------------------------------------------------  Urinalysis    Component Value Date/Time   COLORURINE STRAW (A) 11/18/2018 1248   APPEARANCEUR CLEAR 11/18/2018 1248   LABSPEC 1.006 11/18/2018 1248   PHURINE 8.0 11/18/2018 1248   GLUCOSEU NEGATIVE 11/18/2018 1248   HGBUR NEGATIVE 11/18/2018 1248   BILIRUBINUR NEGATIVE 11/18/2018 1248   KETONESUR NEGATIVE 11/18/2018 1248   PROTEINUR NEGATIVE 11/18/2018 1248   NITRITE NEGATIVE 11/18/2018 1248   LEUKOCYTESUR NEGATIVE  11/18/2018 1248    ----------------------------------------------------------------------------------------------------------------   Imaging results:   Ct Head Wo Contrast  Result Date: 03/23/2019 CLINICAL DATA:  Seizure activity. EXAM: CT HEAD WITHOUT CONTRAST TECHNIQUE: Contiguous axial images were obtained from the base of the skull through the vertex without intravenous contrast. COMPARISON:  11/18/2018 and MRI brain 11/18/2018 FINDINGS: Brain: Ventricles and cisterns are within normal. Minimal chronic ischemic microvascular disease. Old right MCA territory infarct. No mass, mass effect, shift of midline structures or acute hemorrhage. No evidence of acute infarction. Vascular: No hyperdense vessel or unexpected calcification. Right MCA stent unchanged. Skull: Normal. Negative for fracture or focal lesion. Sinuses/Orbits: Orbits are within normal. Very minimal mucosal membrane thickening over the paranasal sinuses. Mastoid air cells are clear. Other: None. IMPRESSION: No acute findings. Mild chronic ischemic microvascular disease. Moderate size old right MCA territory infarct. Right MCA stent unchanged. Electronically Signed   By: Marin Olp M.D.   On: 03/23/2019 13:48   Dg Chest Port 1 View  Result Date: 03/23/2019 CLINICAL DATA:  Seizure activity this morning.  Left-sided weakness. EXAM: PORTABLE CHEST 1 VIEW COMPARISON:  12/18/2017 FINDINGS: Patient rotated to the right. Lungs are adequately inflated and otherwise clear. Cardiomediastinal silhouette and remainder the exam is unchanged. IMPRESSION: No active disease. Electronically Signed   By: Marin Olp M.D.   On: 03/23/2019 13:48    My personal review of EKG: Normal sinus rhythm at 87 bpm with incomplete right bundle branch block and left anterior fascicular block  Assessment & Plan  New onset seizure with history of CVA Neurology on consult Continue with Keppra Check EEG in a.m. Resume p.o. and medications after swallowing  evaluation.  History of right MCA status post right MCA stent and encephalomalacia Continue with ASA/Brilinta  History of hypertension seems to be controlled on no treatment  Diabetes mellitus We will hold glimepiride for now  Hyperlipidemia Continue with statin    DVT Prophylaxis Lovenox  AM Labs Ordered, also please review Full Orders  Code Status full  Disposition Plan: Home  Time spent in minutes : 42 minutes  Condition GUARDED   '@SIGNATURE' @

## 2019-03-24 ENCOUNTER — Observation Stay (HOSPITAL_COMMUNITY): Payer: Self-pay

## 2019-03-24 ENCOUNTER — Other Ambulatory Visit: Payer: Self-pay

## 2019-03-24 LAB — GLUCOSE, CAPILLARY
Glucose-Capillary: 104 mg/dL — ABNORMAL HIGH (ref 70–99)
Glucose-Capillary: 115 mg/dL — ABNORMAL HIGH (ref 70–99)
Glucose-Capillary: 98 mg/dL (ref 70–99)
Glucose-Capillary: 103 mg/dL — ABNORMAL HIGH (ref 70–99)

## 2019-03-24 LAB — LIPID PANEL
Cholesterol: 96 mg/dL (ref 0–200)
HDL: 39 mg/dL — ABNORMAL LOW (ref >40)
LDL Cholesterol: 42 mg/dL (ref 0–99)
Total CHOL/HDL Ratio: 2.5 RATIO
Triglycerides: 77 mg/dL (ref <150)
VLDL: 15 mg/dL (ref 0–40)

## 2019-03-24 LAB — HIV ANTIBODY (ROUTINE TESTING W REFLEX): HIV Screen 4th Generation wRfx: NONREACTIVE

## 2019-03-24 MED ORDER — ONDANSETRON HCL 4 MG/2ML IJ SOLN
4.0000 mg | Freq: Four times a day (QID) | INTRAMUSCULAR | Status: DC | PRN
  Administered 2019-03-25: 4 mg via INTRAVENOUS
  Filled 2019-03-24: qty 2

## 2019-03-24 MED ORDER — LORAZEPAM 2 MG/ML IJ SOLN
1.0000 mg | Freq: Once | INTRAMUSCULAR | Status: AC
  Administered 2019-03-24: 09:00:00 1 mg via INTRAVENOUS
  Filled 2019-03-24: qty 1

## 2019-03-24 NOTE — Progress Notes (Addendum)
Brief History Heather [name] is an 63 y.o. female with RMCA stenosis with RMCA stroke, now s/p stent. She is on ASA + Brilinta + statin. She has mild residual left side weakness from prior stroke, but has returned to home with some assistance. Today family noticed pt was having a tonic/clonic type seizure for ~24minute. Improved with rest. Per EMS and ER staff there was a left gaze as well as left side of face/arm seizure activity upon arrival to ER. She was able to communicate between seizures and return to normal mentation.  Subjective Using translator services, pt repeats a lot of same questions throughout, but seems to be A&Ox4 and back to baseline. No further seizure activity reported  Hospital Medications . aspirin  81 mg Oral Daily  . atorvastatin  10 mg Oral q1800  . enoxaparin (LOVENOX) injection  40 mg Subcutaneous Q24H  . insulin aspart  0-5 Units Subcutaneous QHS  . insulin aspart  0-9 Units Subcutaneous TID WC  . levETIRAcetam  1,000 mg Oral BID  . ticagrelor  90 mg Oral BID     Objective Physical Exam -  Vitals:   03/24/19 0353 03/24/19 0600 03/24/19 0825 03/24/19 1159  BP: 101/61 122/71 105/66 94/64  Pulse: 80  82 81  Resp: 15 17 16 13   Temp: 98.4 F (36.9 C) 97.7 F (36.5 C) 98.1 F (36.7 C) 97.8 F (36.6 C)  TempSrc: Oral Oral Oral Oral  SpO2: 96% 97% 97% 99%  Weight:       General - overweight, no distress Heart - Regular rate and rhythm - no murmer Lungs - Clear to auscultation Abdomen - Soft - non tender Extremities - Distal pulses intact - no edema Skin - Warm and dry  Neurologic Exam:   Mental Status:  Alert, oriented, thought content appropriate per interpreter. However, she is repetitive and asks same questions over and over. Speech without evidence of dysarthria or aphasia per interpreter's opinion. Able to follow 3 step commands without difficulty.  Cranial Nerves:  II-bilateral visual fields intact III/IV/VI-Pupils were equal and reacted.  Extraocular movements were full.  V/VII-no facial numbness and no facial weakness.  VIII-hearing normal.  X-normal speech and symmetrical palatal movement.  XII-midline tongue extension  Motor: Right : Upper extremity   5/5    Left:     Upper extremity   5/5  Lower extremity   5/5     Lower extremity   5/5 Tone and bulk:normal tone throughout; no atrophy noted Sensory: Intact to light touch in all extremities. Deep Tendon Reflexes: 2/4 throughout Plantars: Downgoing bilaterally  Cerebellar: Normal finger to nose and heel to shin bilaterally. Gait: not tested    LABORATORY RESULTS:  Basic Metabolic Panel: Recent Labs  Lab 03/23/19 1250 03/23/19 1308  NA 141 141  K 4.4 4.8  CL 109 108  CO2 21*  --   GLUCOSE 141* 135*  BUN 9 13  CREATININE 0.62 0.40*  CALCIUM 9.1  --     Liver Function Tests: Recent Labs  Lab 03/23/19 1250  AST 27  ALT 27  ALKPHOS 71  BILITOT 0.7  PROT 6.9  ALBUMIN 3.8   No results for input(s): LIPASE, AMYLASE in the last 168 hours. No results for input(s): AMMONIA in the last 168 hours.  CBC: Recent Labs  Lab 03/23/19 1250 03/23/19 1308  WBC 9.5  --   NEUTROABS 6.8  --   HGB 14.2 15.3*  HCT 43.8 45.0  MCV 92.8  --  PLT 268  --     Cardiac Enzymes: No results for input(s): CKTOTAL, CKMB, CKMBINDEX, TROPONINI in the last 168 hours.  Lipid Panel: Recent Labs  Lab 03/24/19 0611  CHOL 96  TRIG 77  HDL 39*  CHOLHDL 2.5  VLDL 15  LDLCALC 42    CBG: Recent Labs  Lab 03/23/19 1214 03/23/19 2128 03/23/19 2216 03/24/19 0615 03/24/19 1237  GLUCAP 120* 57* 107* 104* 115*    Microbiology:   Coagulation Studies: Recent Labs    03/23/19 1250  LABPROT 12.6  INR 1.0    Miscellaneous Labs:   IMAGING RESULTS Ct Head Wo Contrast  Result Date: 03/23/2019 CLINICAL DATA:  Seizure activity. EXAM: CT HEAD WITHOUT CONTRAST TECHNIQUE: Contiguous axial images were obtained from the base of the skull through the vertex without  intravenous contrast. COMPARISON:  11/18/2018 and MRI brain 11/18/2018 FINDINGS: Brain: Ventricles and cisterns are within normal. Minimal chronic ischemic microvascular disease. Old right MCA territory infarct. No mass, mass effect, shift of midline structures or acute hemorrhage. No evidence of acute infarction. Vascular: No hyperdense vessel or unexpected calcification. Right MCA stent unchanged. Skull: Normal. Negative for fracture or focal lesion. Sinuses/Orbits: Orbits are within normal. Very minimal mucosal membrane thickening over the paranasal sinuses. Mastoid air cells are clear. Other: None. IMPRESSION: No acute findings. Mild chronic ischemic microvascular disease. Moderate size old right MCA territory infarct. Right MCA stent unchanged. Electronically Signed   By: Elberta Fortis M.D.   On: 03/23/2019 13:48   Mr Brain Wo Contrast  Result Date: 03/24/2019 CLINICAL DATA:  Stroke EXAM: MRI HEAD WITHOUT CONTRAST TECHNIQUE: Multiplanar, multiecho pulse sequences of the brain and surrounding structures were obtained without intravenous contrast. COMPARISON:  CT head 03/23/2019.  MRI 11/18/2018 FINDINGS: Brain: Chronic hemorrhagic right MCA infarct unchanged from prior studies. Sparing of the basal ganglia. Negative for acute infarct. Negative for hydrocephalus or mass lesion. Mild midline shift to the right due to volume loss. Vascular: Right MCA stent.  Otherwise normal arterial flow voids. Skull and upper cervical spine: Negative Sinuses/Orbits: Mild mucosal edema paranasal sinuses. Negative orbit Other: None IMPRESSION: Chronic hemorrhagic infarct right MCA territory. No acute infarct. No change from prior studies. Electronically Signed   By: Marlan Palau M.D.   On: 03/24/2019 10:11   Dg Chest Port 1 View  Result Date: 03/23/2019 CLINICAL DATA:  Seizure activity this morning.  Left-sided weakness. EXAM: PORTABLE CHEST 1 VIEW COMPARISON:  12/18/2017 FINDINGS: Patient rotated to the right. Lungs are  adequately inflated and otherwise clear. Cardiomediastinal silhouette and remainder the exam is unchanged. IMPRESSION: No active disease. Electronically Signed   By: Elberta Fortis M.D.   On: 03/23/2019 13:48     EEG: done, read pending  Assessment/Plan: 63yr old with h/o RMCA stroke, due to RMCA stenosis, s/p stenting. Now with new onset seizures. She was given Ativan and keppra load.   # Post stroke epilepsy- new onset. Will ck EEG and do infection wk up for underlying etiology # Encephalopathy- post ictal state as well as medication effect. Now back to baseline # RMCA stroke in 2019- s/p stent. Must continue DPAT at this time. MRI w/o new ischemia  RECS: F/u on EEG read Keppra 500 mg BID PO Out pt neuro f/u. (Appointment already set in EMR) Seizure precautions; no driving Neurology will sign off.  Please call as needed.  Desiree Metzger-Cihelka, ARNP-C, ANVP-BC Pager: [phone]  Attending Neurohospitalist Addendum Patient seen and examined with APP/Resident. Agree with the history and physical  as documented above. Agree with the plan as documented, which I helped formulate. I have independently reviewed the chart, obtained history, review of systems and examined the patient.I have personally reviewed pertinent head/neck/spine imaging (CT/MRI). Please feel free to call with any questions. --- Milon Dikes, MD [facility] Pager: [phone]  If 7pm to 7am, please call on call as listed on AMION.    ADDENDUM EEG normal. No new recs. Final recs as above.  -- Milon Dikes, MD [facility] Pager: [phone] If 7pm to 7am, please call on call as listed on AMION.

## 2019-03-24 NOTE — Progress Notes (Signed)
Patient admitted yesterday fro  Home to [facility] ED c/o seizures.  Pt had  RMCA stroke  A year ago with slight residual deficit  Of left arm weakness. Pt spanish speaking ,alert and oriented  to person. Place , month and time of the day.  Pupils  3 mm equal & reactive  to light. [name] . And follow command appropriately..  S cardiac monitor in use ,SR , rate in 80's -90s.   Seizure and fall precaution in place. No seizure activity noted this shift  C/o slight headache and tylenol x 2 given. Vs stable no distress, RN will continue to monitor.Marland Kitchen

## 2019-03-24 NOTE — Progress Notes (Signed)
EEG completed, results pending. 

## 2019-03-24 NOTE — Plan of Care (Signed)
  Problem: Coping: Goal: Ability to adjust to condition or change in health will improve 03/24/2019 0741 by Carin Hock I, RN Outcome: Progressing   Problem: Coping: Goal: Ability to identify appropriate support needs will improve 03/24/2019 0741 by Carin Hock I, RN Outcome: Progressing   Problem: Medication: Goal: Risk for medication side effects will decrease Outcome: Adequate for Discharge

## 2019-03-24 NOTE — Progress Notes (Signed)
Progress Note    Heather [name]  UUV:[id number] DOB: 12/02/1955  DOA: 03/23/2019 PCP: Ladell Pier, MD    Brief Narrative:     Medical records reviewed and are as summarized below:  Heather [name] is an 63 y.o. female with past medical history significant for hypertension and right MCA stenosis and stroke status post stent on aspirin, Brilinta and statin around 1 year ago on 03/2018 with residual left-sided weakness and R MCA encephalomalacia, presenting with new onset seizures.  The patient noticed the abnormal behavior for the last 1 week until today when she was noted to have tonic-clonic seizure activity at home and was brought to the emergency room where she had another episode.  Assessment/Plan:   Active Problems:   Seizures ([place])  New onset seizure with history of CVA Neurology consult appreciated Continue with Keppra EEG reading pending Diet ordered  History of right MCA status post right MCA stent and encephalomalacia Continue with ASA/Brilinta -seems to have continued deficits  History of hypertension -not on any medications  Diabetes mellitus -ssi -hold PO medications  Hyperlipidemia Continue with statin  obesity Body mass index is 32.58 kg/m.   Family Communication/Anticipated D/C date and plan/Code Status   DVT prophylaxis: Lovenox ordered. Code Status: Full Code.  Family Communication:  Disposition Plan: home in AM with home health??-- ? If charity can be used   Medical Consultants:    Neurology     Subjective:   Getting EEG  Objective:    Vitals:   03/24/19 0353 03/24/19 0600 03/24/19 0825 03/24/19 1159  BP: 101/61 122/71 105/66 94/64  Pulse: 80  82 81  Resp: 15 17 16 13   Temp: 98.4 F (36.9 C) 97.7 F (36.5 C) 98.1 F (36.7 C) 97.8 F (36.6 C)  TempSrc: Oral Oral Oral Oral  SpO2: 96% 97% 97% 99%  Weight:        Intake/Output Summary (Last 24 hours) at 03/24/2019 1427 Last data filed at 03/24/2019 1232 Gross per  24 hour  Intake 510 ml  Output 750 ml  Net -240 ml   Filed Weights   03/23/19 2112  Weight: 94.3 kg    Exam: In bed, NAD rrr +BS, soft alert  Data Reviewed:   I have personally reviewed following labs and imaging studies:  Labs: Labs show the following:   Basic Metabolic Panel: Recent Labs  Lab 03/23/19 1250 03/23/19 1308  NA 141 141  K 4.4 4.8  CL 109 108  CO2 21*  --   GLUCOSE 141* 135*  BUN 9 13  CREATININE 0.62 0.40*  CALCIUM 9.1  --    GFR Estimated Creatinine Clearance: 84.9 mL/min (A) (by C-G formula based on SCr of 0.4 mg/dL (L)). Liver Function Tests: Recent Labs  Lab 03/23/19 1250  AST 27  ALT 27  ALKPHOS 71  BILITOT 0.7  PROT 6.9  ALBUMIN 3.8   No results for input(s): LIPASE, AMYLASE in the last 168 hours. No results for input(s): AMMONIA in the last 168 hours. Coagulation profile Recent Labs  Lab 03/23/19 1250  INR 1.0    CBC: Recent Labs  Lab 03/23/19 1250 03/23/19 1308  WBC 9.5  --   NEUTROABS 6.8  --   HGB 14.2 15.3*  HCT 43.8 45.0  MCV 92.8  --   PLT 268  --    Cardiac Enzymes: No results for input(s): CKTOTAL, CKMB, CKMBINDEX, TROPONINI in the last 168 hours. BNP (last 3 results) No results for input(s): PROBNP  in the last 8760 hours. CBG: Recent Labs  Lab 03/23/19 1214 03/23/19 2128 03/23/19 2216 03/24/19 0615 03/24/19 1237  GLUCAP 120* 57* 107* 104* 115*   D-Dimer: No results for input(s): DDIMER in the last 72 hours. Hgb A1c: No results for input(s): HGBA1C in the last 72 hours. Lipid Profile: Recent Labs    03/24/19 0611  CHOL 96  HDL 39*  LDLCALC 42  TRIG 77  CHOLHDL 2.5   Thyroid function studies: No results for input(s): TSH, T4TOTAL, T3FREE, THYROIDAB in the last 72 hours.  Invalid input(s): FREET3 Anemia work up: No results for input(s): VITAMINB12, FOLATE, FERRITIN, TIBC, IRON, RETICCTPCT in the last 72 hours. Sepsis Labs: Recent Labs  Lab 03/23/19 1250  WBC 9.5    Microbiology  Recent Results (from the past 240 hour(s))  SARS Coronavirus 2 (CEPHEID - Performed in [facility] hospital lab), Hosp Order     Status: None   Collection Time: 03/23/19  1:42 PM   Specimen: Nasopharyngeal Swab  Result Value Ref Range Status   SARS Coronavirus 2 NEGATIVE NEGATIVE Final    Comment: (NOTE) If result is NEGATIVE SARS-CoV-2 target nucleic acids are NOT DETECTED. The SARS-CoV-2 RNA is generally detectable in upper and lower  respiratory specimens during the acute phase of infection. The lowest  concentration of SARS-CoV-2 viral copies this assay can detect is 250  copies / mL. A negative result does not preclude SARS-CoV-2 infection  and should not be used as the sole basis for treatment or other  patient management decisions.  A negative result may occur with  improper specimen collection / handling, submission of specimen other  than nasopharyngeal swab, presence of viral mutation(s) within the  areas targeted by this assay, and inadequate number of viral copies  (<250 copies / mL). A negative result must be combined with clinical  observations, patient history, and epidemiological information. If result is POSITIVE SARS-CoV-2 target nucleic acids are DETECTED. The SARS-CoV-2 RNA is generally detectable in upper and lower  respiratory specimens dur ing the acute phase of infection.  Positive  results are indicative of active infection with SARS-CoV-2.  Clinical  correlation with patient history and other diagnostic information is  necessary to determine patient infection status.  Positive results do  not rule out bacterial infection or co-infection with other viruses. If result is PRESUMPTIVE POSTIVE SARS-CoV-2 nucleic acids MAY BE PRESENT.   A presumptive positive result was obtained on the submitted specimen  and confirmed on repeat testing.  While 2019 novel coronavirus  (SARS-CoV-2) nucleic acids may be present in the submitted sample  additional confirmatory  testing may be necessary for epidemiological  and / or clinical management purposes  to differentiate between  SARS-CoV-2 and other Sarbecovirus currently known to infect humans.  If clinically indicated additional testing with an alternate test  methodology [phone] is advised. The SARS-CoV-2 RNA is generally  detectable in upper and lower respiratory sp ecimens during the acute  phase of infection. The expected result is Negative. Fact Sheet for Patients:  BoilerBrush.com.cyhttps://www.fda.gov/media/136312/download Fact Sheet for Healthcare Providers: https://pope.com/https://www.fda.gov/media/136313/download This test is not yet approved or cleared by the [place] FDA and has been authorized for detection and/or diagnosis of SARS-CoV-2 by FDA under an Emergency Use Authorization (EUA).  This EUA will remain in effect (meaning this test can be used) for the duration of the COVID-19 declaration under Section 564(b)(1) of the Act, 21 U.S.C. section 360bbb-3(b)(1), unless the authorization is terminated or revoked sooner. Performed at [facility], [address], [place], [place] [address]     Procedures and diagnostic studies:  Ct Head Wo Contrast  Result Date: 03/23/2019 CLINICAL DATA:  Seizure activity. EXAM: CT HEAD WITHOUT CONTRAST TECHNIQUE: Contiguous axial images were obtained from the base of the skull through the vertex without intravenous contrast. COMPARISON:  11/18/2018 and MRI brain 11/18/2018 FINDINGS: Brain: Ventricles and cisterns are within normal. Minimal chronic ischemic microvascular disease. Old right MCA territory infarct. No mass, mass effect, shift of midline structures or acute hemorrhage. No evidence of acute infarction. Vascular: No hyperdense vessel or unexpected calcification. Right MCA stent unchanged. Skull: Normal. Negative for fracture or focal lesion. Sinuses/Orbits: Orbits are within normal. Very minimal mucosal membrane thickening over the paranasal sinuses. Mastoid air  cells are clear. Other: None. IMPRESSION: No acute findings. Mild chronic ischemic microvascular disease. Moderate size old right MCA territory infarct. Right MCA stent unchanged. Electronically Signed   By: Elberta Fortis M.D.   On: 03/23/2019 13:48   Mr Brain Wo Contrast  Result Date: 03/24/2019 CLINICAL DATA:  Stroke EXAM: MRI HEAD WITHOUT CONTRAST TECHNIQUE: Multiplanar, multiecho pulse sequences of the brain and surrounding structures were obtained without intravenous contrast. COMPARISON:  CT head 03/23/2019.  MRI 11/18/2018 FINDINGS: Brain: Chronic hemorrhagic right MCA infarct unchanged from prior studies. Sparing of the basal ganglia. Negative for acute infarct. Negative for hydrocephalus or mass lesion. Mild midline shift to the right due to volume loss. Vascular: Right MCA stent.  Otherwise normal arterial flow voids. Skull and upper cervical spine: Negative Sinuses/Orbits: Mild mucosal edema paranasal sinuses. Negative orbit Other: None IMPRESSION: Chronic hemorrhagic infarct right MCA territory. No acute infarct. No change from prior studies. Electronically Signed   By: Marlan Palau M.D.   On: 03/24/2019 10:11   Dg Chest Port 1 View  Result Date: 03/23/2019 CLINICAL DATA:  Seizure activity this morning.  Left-sided weakness. EXAM: PORTABLE CHEST 1 VIEW COMPARISON:  12/18/2017 FINDINGS: Patient rotated to the right. Lungs are adequately inflated and otherwise clear. Cardiomediastinal silhouette and remainder the exam is unchanged. IMPRESSION: No active disease. Electronically Signed   By: Elberta Fortis M.D.   On: 03/23/2019 13:48    Medications:   . aspirin  81 mg Oral Daily  . atorvastatin  10 mg Oral q1800  . enoxaparin (LOVENOX) injection  40 mg Subcutaneous Q24H  . insulin aspart  0-5 Units Subcutaneous QHS  . insulin aspart  0-9 Units Subcutaneous TID WC  . levETIRAcetam  1,000 mg Oral BID  . ticagrelor  90 mg Oral BID   Continuous Infusions: . sodium chloride       LOS: 0  days   Joseph Art  [facility]   How to contact the [facility] Attending or Consulting provider 7A - 7P or covering provider during after hours 7P -7A, for this patient?  1. Check the care team in [facility] and look for a) attending/consulting [facility] provider listed and b) the [facility] team listed 2. Log into [email or web address] and use [place]'s universal password to access. If you do not have the password, please contact the hospital operator. 3. Locate the [facility] provider you are looking for under [facility] and page to a number that you can be directly reached. 4. If you still have difficulty reaching the provider, please page the [facility] (Director on Call) for the Hospitalists listed on amion for assistance.  03/24/2019, 2:27 PM

## 2019-03-24 NOTE — Procedures (Signed)
  [place] A. Merlene Laughter, MD     [email or web address]           HISTORY: The patient is a 63 year old female who presents with left facial and left-sided shaking activity/convulsions.  There is a previous history of a cerebral infarct with left-sided weakness.  The semiology seems to be consistent with post stroke seizures.  MEDICATIONS:  Current Facility-Administered Medications:  .  [COMPLETED] sodium chloride 0.9 % bolus 500 mL, 500 mL, Intravenous, Once, Stopped at 03/23/19 1505 **FOLLOWED BY** 0.9 %  sodium chloride infusion, 100 mL/hr, Intravenous, Continuous, Hijazi, Ali, MD .  acetaminophen (TYLENOL) tablet 650 mg, 650 mg, Oral, Q4H PRN, 650 mg at 03/24/19 0557 **OR** acetaminophen (TYLENOL) solution 650 mg, 650 mg, Per Tube, Q4H PRN **OR** acetaminophen (TYLENOL) suppository 650 mg, 650 mg, Rectal, Q4H PRN, Merton Border, MD .  aspirin chewable tablet 81 mg, 81 mg, Oral, Daily, Merton Border, MD, 81 mg at 03/24/19 1248 .  atorvastatin (LIPITOR) tablet 10 mg, 10 mg, Oral, q1800, Hijazi, Ali, MD .  enoxaparin (LOVENOX) injection 40 mg, 40 mg, Subcutaneous, Q24H, Hijazi, Ali, MD, 40 mg at 03/23/19 2259 .  insulin aspart (novoLOG) injection 0-5 Units, 0-5 Units, Subcutaneous, QHS, Hijazi, Ali, MD .  insulin aspart (novoLOG) injection 0-9 Units, 0-9 Units, Subcutaneous, TID WC, Hijazi, Ali, MD .  levETIRAcetam (KEPPRA) tablet 1,000 mg, 1,000 mg, Oral, BID, Merton Border, MD, 1,000 mg at 03/24/19 1248 .  ondansetron (ZOFRAN) injection 4 mg, 4 mg, Intravenous, Q6H PRN, Vann, Jessica U, DO .  senna-docusate (Senokot-S) tablet 1 tablet, 1 tablet, Oral, QHS PRN, Merton Border, MD .  ticagrelor (BRILINTA) tablet 90 mg, 90 mg, Oral, BID, Hijazi, Ali, MD, 90 mg at 03/24/19 1248     ANALYSIS: A 16 channel recording using standard 10 20 measurements is conducted for 23 minutes.   There is a well-formed posterior dominant rhythm of 10 hertz which attenuates with eye-opening.  There is  beta activity observed in the frontal areas.  Awake and sleep architecture are observed with the spindles and K complexes indicating stage II non-REM sleep.  There appears to be somewhat increased spindling noted.  Photic stimulation and hyperventilation are not conducted.  There is normal myogenic artifact noted.  There is no focal or lateral slowing.  There is no epileptiform activity is noted.   IMPRESSION: 1.   This is a normal recording of the awake and sleep states.      Pearson Reasons A. Merlene Laughter, M.D.  Diplomate, Tax adviser of Psychiatry and Neurology ( Neurology).

## 2019-03-24 NOTE — Evaluation (Signed)
Physical Therapy Evaluation Patient Details Name: Heather [name] MRN: [id number] DOB: 11-19-1955 Today's Date: 03/24/2019   History of Present Illness  63 y.o. female, with past medical history significant for hypertension and right MCA stenosis and stroke status post stent. Pt noticed the abnormal behavior for the last 1 week until today when she was noted to have tonic-clonic seizure activity at home. Came to ED where she had a 2nd tonic-clonic seizure. CT angiogram with 50% in-stent stenosis. Admitted 03/23/19  Clinical Impression  PTA pt living with daughter in apartment with 2 short flights of steps to enter. Pt reports independence in mobility and iADLs, taking care of her grandchildren regularly. Pt is currently limited in safe mobility by decreased balance and endurance especially with Lsided foot clearance with ambulation. Pt currently requires minA for bed mobility and transfers and modA for ambulation of 80 feet for 1xLOB, otherwise ambulates with min A. Pt became nauseous with ambulation. PT recommends HHPT level rehab at discharge to improve balance and endurance. PT will continue to follow acutely.     Follow Up Recommendations Home health PT;Supervision for mobility/OOB    Equipment Recommendations  Rolling walker with 5" wheels       Precautions / Restrictions Precautions Precautions: None Restrictions Weight Bearing Restrictions: No      Mobility  Bed Mobility Overal bed mobility: Needs Assistance Bed Mobility: Supine to Sit     Supine to sit: Min assist     General bed mobility comments: minA for handheld assist from therapist  Transfers Overall transfer level: Needs assistance Equipment used: 1 person hand held assist Transfers: Sit to/from Stand Sit to Stand: Min assist         General transfer comment: MinA for support as pt with LOB episodes requiring modA for stability.  Ambulation/Gait Ambulation/Gait assistance: Min assist;Mod assist Gait Distance  (Feet): 80 Feet Assistive device: None Gait Pattern/deviations: Decreased step length - right;Decreased stance time - right;Step-through pattern;Narrow base of support Gait velocity: slowed Gait velocity interpretation: <1.8 ft/sec, indicate of risk for recurrent falls General Gait Details: minA for ambulation without AD as gait progressed pt with decreased L foot clearance in swing through increasing instability and requiring 1x modA for LoB, at end of ambulation pt returned to supine in bed due to nausea         Balance Overall balance assessment: Needs assistance Sitting-balance support: Bilateral upper extremity supported Sitting balance-Leahy Scale: Fair Sitting balance - Comments: able to accept balance challenges   Standing balance support: Single extremity supported Standing balance-Leahy Scale: Fair Standing balance comment: Pt LOB episode when ambulating in hallway with modA fo stability usign 1 person hand held assist.                              Pertinent Vitals/Pain Pain Assessment: Faces Faces Pain Scale: Hurts little more Pain Location: stomach    Home Living Family/patient expects to be discharged to:: Private residence Living Arrangements: Other relatives(grandaughter) Available Help at Discharge: Family;Available PRN/intermittently Type of Home: Apartment Home Access: Stairs to enter Entrance Stairs-Rails: Right Entrance Stairs-Number of Steps: 2 short flights Home Layout: One level Home Equipment: None      Prior Function Level of Independence: Independent         Comments: ADLS, IADLs, and babysits grandkids. No driving.     Hand Dominance   Dominant Hand: Right    Extremity/Trunk Assessment   Upper Extremity Assessment Upper Extremity  Assessment: Defer to OT evaluation    Lower Extremity Assessment Lower Extremity Assessment: LLE deficits/detail LLE Deficits / Details: during ambulation pt with decreased foot clearance with  swing through     Cervical / Trunk Assessment Cervical / Trunk Assessment: Normal  Communication   Communication: Prefers language other than English  Cognition Arousal/Alertness: Awake/alert Behavior During Therapy: WFL for tasks assessed/performed Overall Cognitive Status: Within Functional Limits for tasks assessed                                        General Comments General comments (skin integrity, edema, etc.): used stratus interpreter esphany- [phone]        Assessment/Plan    PT Assessment Patient needs continued PT services  PT Problem List Decreased strength;Decreased activity tolerance;Decreased balance;Decreased mobility       PT Treatment Interventions DME instruction;Gait training;Stair training;Functional mobility training;Therapeutic activities;Therapeutic exercise;Balance training;Cognitive remediation;Patient/family education    PT Goals (Current goals can be found in the Care Plan section)  Acute Rehab PT Goals Patient Stated Goal: to go home PT Goal Formulation: With patient Time For Goal Achievement: 04/07/19 Potential to Achieve Goals: Fair    Frequency Min 3X/week   Barriers to discharge        Co-evaluation PT/OT/SLP Co-Evaluation/Treatment: Yes Reason for Co-Treatment: For patient/therapist safety;Other (comment)(use of spanish interpreter)           AM-PAC PT "6 Clicks" Mobility  Outcome Measure Help needed turning from your back to your side while in a flat bed without using bedrails?: None Help needed moving from lying on your back to sitting on the side of a flat bed without using bedrails?: A Little Help needed moving to and from a bed to a chair (including a wheelchair)?: A Little Help needed standing up from a chair using your arms (e.g., wheelchair or bedside chair)?: A Little Help needed to walk in hospital room?: A Little Help needed climbing 3-5 steps with a railing? : Total 6 Click Score: 17    End of  Session Equipment Utilized During Treatment: Gait belt Activity Tolerance: Patient tolerated treatment well Patient left: in bed;with call bell/phone within reach;with bed alarm set Nurse Communication: Mobility status;Other (comment)(Nausea with ambulation) PT Visit Diagnosis: Unsteadiness on feet (R26.81);Other abnormalities of gait and mobility (R26.89);Muscle weakness (generalized) (M62.81);Difficulty in walking, not elsewhere classified (R26.2)    Time: 1010-1033 PT Time Calculation (min) (ACUTE ONLY): 23 min   Charges:   PT Evaluation $PT Eval Moderate Complexity: 1 Mod          Kirsty Monjaraz B. Migdalia Dk PT, DPT Acute Rehabilitation Services Pager [phone] Office [phone]   Marceline 03/24/2019, 1:19 PM

## 2019-03-24 NOTE — Evaluation (Signed)
Occupational Therapy Evaluation Patient Details Name: Heather [name] MRN: [id number] DOB: 1956/04/21 Today's Date: 03/24/2019    History of Present Illness 63 y.o. female, with past medical history significant for hypertension and right MCA stenosis and stroke status post stent. Pt noticed the abnormal behavior for the last 1 week until today when she was noted to have tonic-clonic seizure activity at home. Came to ED where she had a 2nd tonic-clonic seizure. CT angiogram with 50% in-stent stenosis. Admitted 03/23/19   Clinical Impression   Pt PTA: pt independent with ADL and mobility. Pt currently performing bed mobility with minA; transfers with minA for stability and ambulation in room and hallway with minA to modA for LOB episodes. Pt set-upA fror UB ADL and minA for LB ADL. Pt using BUEs well and moving well, but easily loses balance with any challenges or distractions. Pt would benefit from continued OT skilled services for ADL, mobility and safety in HHOT setting. OT following acutely for balance and ADL tasks.      Follow Up Recommendations  Home health OT;Supervision - Intermittent    Equipment Recommendations  None recommended by OT    Recommendations for Other Services       Precautions / Restrictions Precautions Precautions: None Restrictions Weight Bearing Restrictions: No      Mobility Bed Mobility Overal bed mobility: Needs Assistance Bed Mobility: Supine to Sit     Supine to sit: Min assist     General bed mobility comments: minA for handheld assist from therapist  Transfers Overall transfer level: Needs assistance Equipment used: 1 person hand held assist Transfers: Sit to/from Stand Sit to Stand: Min assist         General transfer comment: MinA for support as pt with LOB episodes requiring modA for stability.    Balance Overall balance assessment: Needs assistance Sitting-balance support: Bilateral upper extremity supported Sitting balance-Leahy  Scale: Fair Sitting balance - Comments: able to accept balance challenges   Standing balance support: Single extremity supported Standing balance-Leahy Scale: Fair Standing balance comment: Pt LOB episode when ambulating in hallway with modA fo stability usign 1 person hand held assist.                            ADL either performed or assessed with clinical judgement   ADL Overall ADL's : Needs assistance/impaired Eating/Feeding: Set up;Sitting   Grooming: Set up;Sitting   Upper Body Bathing: Set up;Sitting   Lower Body Bathing: Minimal assistance;Sitting/lateral leans;Sit to/from stand   Upper Body Dressing : Set up;Sitting   Lower Body Dressing: Minimal assistance;Sit to/from stand;Sitting/lateral leans   Toilet Transfer: Minimal assistance;Regular Toilet;Grab bars   Toileting- Clothing Manipulation and Hygiene: Minimal assistance;+2 for physical assistance;+2 for safety/equipment;Sitting/lateral lean;Sit to/from stand Toileting - Clothing Manipulation Details (indicate cue type and reason): helpful for +2 for stratus     Functional mobility during ADLs: Minimal assistance;Cueing for safety General ADL Comments: Pt set-upA for UB ADL and minA for LB ADL.     Vision Baseline Vision/History: No visual deficits Vision Assessment?: No apparent visual deficits     Perception     Praxis      Pertinent Vitals/Pain Pain Assessment: Faces Faces Pain Scale: Hurts little more Pain Location: stomach     Hand Dominance Right   Extremity/Trunk Assessment Upper Extremity Assessment Upper Extremity Assessment: Overall WFL for tasks assessed   Lower Extremity Assessment Lower Extremity Assessment: Defer to PT evaluation   Cervical /  Trunk Assessment Cervical / Trunk Assessment: Normal   Communication Communication Communication: Prefers language other than English   Cognition Arousal/Alertness: Awake/alert Behavior During Therapy: WFL for tasks  assessed/performed Overall Cognitive Status: Within Functional Limits for tasks assessed                                     General Comments  used stratus interpreter [phone]    Exercises     Shoulder Instructions      Home Living Family/patient expects to be discharged to:: Private residence Living Arrangements: Other relatives(grandaughter) Available Help at Discharge: Family;Available PRN/intermittently Type of Home: Apartment Home Access: Stairs to enter Entrance Stairs-Number of Steps: 2 short flights Entrance Stairs-Rails: Right Home Layout: One level     Bathroom Shower/Tub: Teacher, early years/pre: Standard     Home Equipment: None          Prior Functioning/Environment Level of Independence: Independent        Comments: ADLS, IADLs, and babysits grandkids. No driving.        OT Problem List: Decreased strength;Decreased activity tolerance;Impaired balance (sitting and/or standing);Decreased safety awareness;Decreased cognition      OT Treatment/Interventions: Self-care/ADL training;Therapeutic exercise;Neuromuscular education;Energy conservation;Therapeutic activities;Patient/family education;Balance training    OT Goals(Current goals can be found in the care plan section) Acute Rehab OT Goals Patient Stated Goal: to go home OT Goal Formulation: With patient Time For Goal Achievement: 04/07/19 Potential to Achieve Goals: Fair ADL Goals Pt Will Perform Grooming: with modified independence;standing Pt Will Perform Upper Body Dressing: with modified independence;standing;sitting Pt Will Perform Lower Body Dressing: with modified independence;sitting/lateral leans;sit to/from stand Pt Will Perform Toileting - Clothing Manipulation and hygiene: with modified independence;sitting/lateral leans;sit to/from stand Additional ADL Goal #1: Pt will perform OOB ADL with S with fair balance  OT Frequency: Min 2X/week   Barriers  to D/C:            Co-evaluation              AM-PAC OT "6 Clicks" Daily Activity     Outcome Measure Help from another person eating meals?: None Help from another person taking care of personal grooming?: None Help from another person toileting, which includes using toliet, bedpan, or urinal?: A Little Help from another person bathing (including washing, rinsing, drying)?: A Little Help from another person to put on and taking off regular upper body clothing?: None Help from another person to put on and taking off regular lower body clothing?: A Little 6 Click Score: 21   End of Session Equipment Utilized During Treatment: Gait belt Nurse Communication: Mobility status  Activity Tolerance: Patient tolerated treatment well Patient left: in bed;with call bell/phone within reach;with bed alarm set  OT Visit Diagnosis: Unsteadiness on feet (R26.81);Muscle weakness (generalized) (M62.81)                Time: 1000-1033 OT Time Calculation (min): 33 min Charges:  OT General Charges $OT Visit: 1 Visit OT Evaluation $OT Eval Moderate Complexity: 1 Mod  Darryl Nestle) Marsa Aris OTR/L Acute Rehabilitation Services Pager: [phone] Office: [phone]   Audie Pinto 03/24/2019, 11:53 AM

## 2019-03-25 ENCOUNTER — Encounter (HOSPITAL_COMMUNITY): Payer: Self-pay | Admitting: Internal Medicine

## 2019-03-25 LAB — HEMOGLOBIN A1C
Hgb A1c MFr Bld: 5.4 % (ref 4.8–5.6)
Mean Plasma Glucose: 108 mg/dL

## 2019-03-25 LAB — GLUCOSE, CAPILLARY
Glucose-Capillary: 116 mg/dL — ABNORMAL HIGH (ref 70–99)
Glucose-Capillary: 135 mg/dL — ABNORMAL HIGH (ref 70–99)

## 2019-03-25 MED ORDER — SERTRALINE HCL 50 MG PO TABS: 50.0000 mg | ORAL_TABLET | Freq: Every day | ORAL | 2 refills | Status: DC

## 2019-03-25 MED ORDER — TICAGRELOR 90 MG PO TABS: 90.0000 mg | ORAL_TABLET | Freq: Two times a day (BID) | ORAL | 3 refills | Status: DC

## 2019-03-25 MED ORDER — LEVETIRACETAM 1000 MG PO TABS: 1000.0000 mg | ORAL_TABLET | Freq: Two times a day (BID) | ORAL | 1 refills | Status: DC

## 2019-03-25 MED ORDER — ATORVASTATIN CALCIUM 10 MG PO TABS: 10.0000 mg | ORAL_TABLET | Freq: Every day | ORAL | 3 refills | Status: DC

## 2019-03-25 MED ORDER — LEVETIRACETAM 500 MG PO TABS: 500.0000 mg | ORAL_TABLET | Freq: Two times a day (BID) | ORAL | Status: DC

## 2019-03-25 MED ORDER — LEVETIRACETAM 500 MG PO TABS: 500.0000 mg | ORAL_TABLET | Freq: Two times a day (BID) | ORAL | 1 refills | Status: DC

## 2019-03-25 MED ORDER — GLIMEPIRIDE 1 MG PO TABS: 1.0000 mg | ORAL_TABLET | Freq: Every day | ORAL | 1 refills | Status: DC

## 2019-03-25 NOTE — Progress Notes (Signed)
AVS reviewed with patient and daughter via Optometrist. Patient given a copy of AVS and medications to take home. Patient dressed, all lines removed, belongings packed (including walker), and patient taken by wheelchair to daughter's car for discharge home.

## 2019-03-25 NOTE — Plan of Care (Signed)
No seizure  activity noted neuro intact Problem: Coping: Goal: Ability to identify appropriate support needs will improve Outcome: Progressing   Problem: Health Behavior/Discharge Planning: Goal: Compliance with prescribed medication regimen will improve Outcome: Progressing   Problem: Health Behavior/Discharge Planning: Goal: Compliance with prescribed medication regimen will improve Outcome: Progressing   Problem: Medication: Goal: Risk for medication side effects will decrease Outcome: Progressing

## 2019-03-25 NOTE — Discharge Summary (Signed)
Physician Discharge Summary  Heather [name] XFG:[id number] DOB: August 15, 1956 DOA: 03/23/2019  PCP: Ladell Pier, MD  Admit date: 03/23/2019 Discharge date: 03/25/2019  Time spent: 40 minutes  Recommendations for Outpatient Follow-up:  1. Follow up with neuro as scheduled. 2. Home health PT   Discharge Diagnoses:  Principal Problem:   Seizures ([place]) Active Problems:   Right middle cerebral artery stroke (HCC)   Left hemiparesis ([place])   Diabetes mellitus type 2 in obese [facility])   Depression   Essential hypertension   Discharge Condition: stable at discharge  Diet recommendation: heart healthy carb modified  Filed Weights   03/23/19 2112 03/23/19 2314  Weight: 94.3 kg 95.3 kg    History of present illness:  Heather [name]  is a 63 y.o. female, with past medical history significant for hypertension and right MCA stenosis and stroke status post stent on aspirin, Brilinta and statin around 1 year ago  with residual left-sided weakness and R MCA encephalomalacia, presented 03/23/19 with new onset seizures.  The patient noticed the abnormal behavior for the previous 1 week until today when she was noted to have tonic-clonic seizure activity at home and was brought to the emergency room where she had another episode.  It started with facial twitching and left arm being tense and continued to full tonic-clonic seizure.  Patient continued to have seizures in the emergency room until she received Ativan.  Patient did not have any seizure disorder. Most of the history was taken from ER note G on February 2020 which was negative after she noticed some twitching on the right side.  She had CT angiogram with 50% in-stent stenosis.  Neurology saw the patient in consult and started patient on Keppra and advised EEG in a.m. consult reviewed.  Hospital Course:   Seizures [facility])  New onset seizure with history of CVA. EEG reading normal. Neurology consult appreciated.Continue with Keppra. No further  episodes. Follow up with neuro per schedule  History of right MCA status post right MCA stent and encephalomalacia. Continue with ASA/Brilinta. Seems to have continued deficits. Evaluated by PT and HH PT recommended  History of hypertension. BP on low end of normal. Not on any medications  Diabetes mellitus. A1c pending at discharge. Continue home oral agents  Hyperlipidemia Continue with statin  obesity  Procedures:  EEG  Consultations:  [facility] neurology  Discharge Exam: Vitals:   03/25/19 0443 03/25/19 0753  BP:  103/77  Pulse: 77 92  Resp:    Temp: 98 F (36.7 C) 98 F (36.7 C)  SpO2:  98%    General: awake alert speech clear facial symmetry Cardiovascular: rrr no mgr no LE edema Respiratory: normal effort BS clear bilaterally no wheeze  Discharge Instructions   Discharge Instructions    Call MD for:  difficulty breathing, headache or visual disturbances   Complete by: As directed    Call MD for:  persistant dizziness or light-headedness   Complete by: As directed    Call MD for:  persistant nausea and vomiting   Complete by: As directed    Diet - low sodium heart healthy   Complete by: As directed    Discharge instructions   Complete by: As directed    Take medications as prescribed Follow up with neuro as scheduled   Increase activity slowly   Complete by: As directed      Allergies as of 03/25/2019   No Known Allergies     Medication List    TAKE these medications  acetaminophen 500 MG tablet Commonly known as: TYLENOL Take 1,000 mg by mouth every 8 (eight) hours as needed for moderate pain or headache.   aspirin 81 MG chewable tablet Chew 1 tablet (81 mg total) by mouth daily.   atorvastatin 10 MG tablet Commonly known as: LIPITOR Take 1 tablet (10 mg total) by mouth daily at 6 PM.   glimepiride 1 MG tablet Commonly known as: AMARYL Take 1 tablet (1 mg total) by mouth daily with breakfast.   glucose blood test strip Commonly  known as: True Metrix Blood Glucose Test Use as instructed   levETIRAcetam 500 MG tablet Commonly known as: KEPPRA Take 1 tablet (500 mg total) by mouth 2 (two) times daily.   polyethylene glycol powder 17 GM/SCOOP powder Commonly known as: GLYCOLAX/MIRALAX Take 17 g by mouth daily as needed. What changed: reasons to take this   sertraline 50 MG tablet Commonly known as: ZOLOFT Take 1 tablet (50 mg total) by mouth daily.   ticagrelor 90 MG Tabs tablet Commonly known as: Brilinta Take 1 tablet (90 mg total) by mouth 2 (two) times daily.   True Metrix Meter Devi 1 kit by Does not apply route 4 (four) times daily.   TRUEplus Lancets 28G Misc 28 g by Does not apply route QID.   VITAMIN D3 PO Take 1 tablet by mouth daily.      No Known Allergies    The results of significant diagnostics from this hospitalization (including imaging, microbiology, ancillary and laboratory) are listed below for reference.    Significant Diagnostic Studies: Ct Head Wo Contrast  Result Date: 03/23/2019 CLINICAL DATA:  Seizure activity. EXAM: CT HEAD WITHOUT CONTRAST TECHNIQUE: Contiguous axial images were obtained from the base of the skull through the vertex without intravenous contrast. COMPARISON:  11/18/2018 and MRI brain 11/18/2018 FINDINGS: Brain: Ventricles and cisterns are within normal. Minimal chronic ischemic microvascular disease. Old right MCA territory infarct. No mass, mass effect, shift of midline structures or acute hemorrhage. No evidence of acute infarction. Vascular: No hyperdense vessel or unexpected calcification. Right MCA stent unchanged. Skull: Normal. Negative for fracture or focal lesion. Sinuses/Orbits: Orbits are within normal. Very minimal mucosal membrane thickening over the paranasal sinuses. Mastoid air cells are clear. Other: None. IMPRESSION: No acute findings. Mild chronic ischemic microvascular disease. Moderate size old right MCA territory infarct. Right MCA stent  unchanged. Electronically Signed   By: Marin Olp M.D.   On: 03/23/2019 13:48   Mr Brain Wo Contrast  Result Date: 03/24/2019 CLINICAL DATA:  Stroke EXAM: MRI HEAD WITHOUT CONTRAST TECHNIQUE: Multiplanar, multiecho pulse sequences of the brain and surrounding structures were obtained without intravenous contrast. COMPARISON:  CT head 03/23/2019.  MRI 11/18/2018 FINDINGS: Brain: Chronic hemorrhagic right MCA infarct unchanged from prior studies. Sparing of the basal ganglia. Negative for acute infarct. Negative for hydrocephalus or mass lesion. Mild midline shift to the right due to volume loss. Vascular: Right MCA stent.  Otherwise normal arterial flow voids. Skull and upper cervical spine: Negative Sinuses/Orbits: Mild mucosal edema paranasal sinuses. Negative orbit Other: None IMPRESSION: Chronic hemorrhagic infarct right MCA territory. No acute infarct. No change from prior studies. Electronically Signed   By: Franchot Gallo M.D.   On: 03/24/2019 10:11   Dg Chest Port 1 View  Result Date: 03/23/2019 CLINICAL DATA:  Seizure activity this morning.  Left-sided weakness. EXAM: PORTABLE CHEST 1 VIEW COMPARISON:  12/18/2017 FINDINGS: Patient rotated to the right. Lungs are adequately inflated and otherwise clear. Cardiomediastinal silhouette and  remainder the exam is unchanged. IMPRESSION: No active disease. Electronically Signed   By: Marin Olp M.D.   On: 03/23/2019 13:48    Microbiology: Recent Results (from the past 240 hour(s))  SARS Coronavirus 2 (CEPHEID - Performed in [place] hospital lab), Hosp Order     Status: None   Collection Time: 03/23/19  1:42 PM   Specimen: Nasopharyngeal Swab  Result Value Ref Range Status   SARS Coronavirus 2 NEGATIVE NEGATIVE Final    Comment: (NOTE) If result is NEGATIVE SARS-CoV-2 target nucleic acids are NOT DETECTED. The SARS-CoV-2 RNA is generally detectable in upper and lower  respiratory specimens during the acute phase of infection. The lowest   concentration of SARS-CoV-2 viral copies this assay can detect is 250  copies / mL. A negative result does not preclude SARS-CoV-2 infection  and should not be used as the sole basis for treatment or other  patient management decisions.  A negative result may occur with  improper specimen collection / handling, submission of specimen other  than nasopharyngeal swab, presence of viral mutation(s) within the  areas targeted by this assay, and inadequate number of viral copies  (<250 copies / mL). A negative result must be combined with clinical  observations, patient history, and epidemiological information. If result is POSITIVE SARS-CoV-2 target nucleic acids are DETECTED. The SARS-CoV-2 RNA is generally detectable in upper and lower  respiratory specimens dur ing the acute phase of infection.  Positive  results are indicative of active infection with SARS-CoV-2.  Clinical  correlation with patient history and other diagnostic information is  necessary to determine patient infection status.  Positive results do  not rule out bacterial infection or co-infection with other viruses. If result is PRESUMPTIVE POSTIVE SARS-CoV-2 nucleic acids MAY BE PRESENT.   A presumptive positive result was obtained on the submitted specimen  and confirmed on repeat testing.  While 2019 novel coronavirus  (SARS-CoV-2) nucleic acids may be present in the submitted sample  additional confirmatory testing may be necessary for epidemiological  and / or clinical management purposes  to differentiate between  SARS-CoV-2 and other Sarbecovirus currently known to infect humans.  If clinically indicated additional testing with an alternate test  methodology [id number]) is advised. The SARS-CoV-2 RNA is generally  detectable in upper and lower respiratory sp ecimens during the acute  phase of infection. The expected result is Negative. Fact Sheet for Patients:  StrictlyIdeas.no Fact Sheet  for Healthcare Providers: BankingDealers.co.za This test is not yet approved or cleared by the [place] FDA and has been authorized for detection and/or diagnosis of SARS-CoV-2 by FDA under an Emergency Use Authorization (EUA).  This EUA will remain in effect (meaning this test can be used) for the duration of the COVID-19 declaration under Section 564(b)(1) of the Act, 21 U.S.C. section 360bbb-3(b)(1), unless the authorization is terminated or revoked sooner. Performed at [facility], [place] [address], [place], [place] [address]      Labs: Basic Metabolic Panel: Recent Labs  Lab 03/23/19 1250 03/23/19 1308  NA 141 141  K 4.4 4.8  CL 109 108  CO2 21*  --   GLUCOSE 141* 135*  BUN 9 13  CREATININE 0.62 0.40*  CALCIUM 9.1  --    Liver Function Tests: Recent Labs  Lab 03/23/19 1250  AST 27  ALT 27  ALKPHOS 71  BILITOT 0.7  PROT 6.9  ALBUMIN 3.8   No results for input(s): LIPASE, AMYLASE in the last 168  hours. No results for input(s): AMMONIA in the last 168 hours. CBC: Recent Labs  Lab 03/23/19 1250 03/23/19 1308  WBC 9.5  --   NEUTROABS 6.8  --   HGB 14.2 15.3*  HCT 43.8 45.0  MCV 92.8  --   PLT 268  --    Cardiac Enzymes: No results for input(s): CKTOTAL, CKMB, CKMBINDEX, TROPONINI in the last 168 hours. BNP: BNP (last 3 results) No results for input(s): BNP in the last 8760 hours.  ProBNP (last 3 results) No results for input(s): PROBNP in the last 8760 hours.  CBG: Recent Labs  Lab 03/24/19 0615 03/24/19 1237 03/24/19 1629 03/24/19 2115 03/25/19 0612  GLUCAP 104* 115* 98 103* 116*       Signed:  Radene Gunning NP [facility] 03/25/2019, 10:25 AM

## 2019-03-25 NOTE — TOC Transition Note (Addendum)
Transition of Care [facility]) - CM/SW Discharge Note   Patient Details  Name: Heather [name] MRN: [id number] Date of Birth: March 14, 1956  Transition of Care [facility]) CM/SW Contact:  Bartholomew Crews, RN Phone Number: [phone] 03/25/2019, 11:14 AM   Clinical Narrative:    Received message from [place] that patient had several prescriptions pending - Match created for medication assistance providing $3 copay per medication prescription.   Noted PT recommendation for RW and HH PT. Notified Zach at [place] for [place]. Notified Tiffany at [facility] for [place].   Appointment already scheduled for hospital f/u with [facility].   Update: Spoke with patient at bedside using Language Line interpreter to explain the above information. Patient reported that her daughter - Heather [name] - will be picking her up and will be able to pay for medications. Telephone call to Heather [name] who advised that pharmacy had already contacted her. Pharmacy delivered medications to the room while CM was present. Daughter assisted with communicating to patient that medications were for home use. Daughter to provide transportation home.   No other transition of care needs identified at this time.    Final next level of care: Home w Home Health Services Barriers to Discharge: No Barriers Identified   Patient Goals and CMS Choice   CMS Medicare.gov Compare Post Acute Care list provided to:: Patient    Discharge Placement                       Discharge Plan and Services                DME Arranged: [place] rolling DME Agency: [employer] Date DME Agency Contacted: 03/25/19 Time DME Agency Contacted: [id number] Representative spoke with at DME Agency: [place]: PT [place]: [facility] [employer] Date [place]: 03/25/19 Time [place]: 1039 Representative spoke with at [place]: [place]) Interventions      Readmission Risk Interventions No flowsheet data found.

## 2019-03-27 ENCOUNTER — Ambulatory Visit: Payer: Self-pay | Attending: Internal Medicine | Admitting: Physician Assistant

## 2019-03-27 ENCOUNTER — Other Ambulatory Visit: Payer: Self-pay

## 2019-03-27 DIAGNOSIS — F32A Depression, unspecified: Secondary | ICD-10-CM

## 2019-03-27 DIAGNOSIS — E669 Obesity, unspecified: Secondary | ICD-10-CM

## 2019-03-27 DIAGNOSIS — Z789 Other specified health status: Secondary | ICD-10-CM

## 2019-03-27 DIAGNOSIS — Z603 Acculturation difficulty: Secondary | ICD-10-CM

## 2019-03-27 DIAGNOSIS — R569 Unspecified convulsions: Secondary | ICD-10-CM

## 2019-03-27 DIAGNOSIS — E1169 Type 2 diabetes mellitus with other specified complication: Secondary | ICD-10-CM

## 2019-03-27 DIAGNOSIS — Z09 Encounter for follow-up examination after completed treatment for conditions other than malignant neoplasm: Secondary | ICD-10-CM

## 2019-03-27 DIAGNOSIS — F329 Major depressive disorder, single episode, unspecified: Secondary | ICD-10-CM

## 2019-03-27 MED ORDER — TRUE METRIX METER DEVI: 1.0000 | Freq: Four times a day (QID) | 0 refills | Status: DC

## 2019-03-27 MED ORDER — TRUEPLUS LANCETS 28G MISC: 28.0000 g | Freq: Four times a day (QID) | 3 refills | Status: DC

## 2019-03-27 MED ORDER — TRUE METRIX BLOOD GLUCOSE TEST VI STRP: ORAL_STRIP | 11 refills | Status: DC

## 2019-03-27 NOTE — Progress Notes (Signed)
Virtual Visit via Telephone Note  I connected with [name] on 03/27/19 at 11:10 AM EDT by telephone and verified that I am speaking with the correct person using two identifiers.   I discussed the limitations, risks, security and privacy concerns of performing an evaluation and management service by telephone and the availability of in person appointments. I also discussed with the patient that there may be a patient responsible charge related to this service. The patient expressed understanding and agreed to proceed.  Patient location:  home My Location:  [place] office Persons on the call:  Myself, the interpreter(Jose), and the patient.     History of Present Illness: Hospitalized 6/20-6/22 for seizure activity.  Has f/up scheduled with Neurology.  She has not had any further seizure activity.  Called for an appt today bc feeling hopeless and depressed bc she is afraid of why she had a seizure.   She was worked in to my schedule.  She was recently placed on zoloft and took it for a few days.  She stopped taking it when she began to feel better.  She feels like she is depressed bc of quarantine and recent seizure.  She has days that she "feel I would be better off dead."  She has not formulated any plan to harm herself and says she would never hurt herself-she "want to live" because she has grandchildren she is close with and lives with.  She also has other family and church support.  No weapons in the home.    From discharge summary:  Discharge Diagnoses:  Principal Problem:   Seizures ([place]) Active Problems:   Right middle cerebral artery stroke (HCC)   Left hemiparesis ([place])   Diabetes mellitus type 2 in obese [facility])   Depression   Essential hypertension   History of present illness:  Heather [name] a45 y.o.female,with past medical history significant for hypertension and right MCA stenosis and stroke status post stent on aspirin, Brilinta and statin around 1 year ago  with  residual left-sided weakness and R MCA encephalomalacia, presented 03/23/19 with new onset seizures. The patient noticed the abnormal behavior for the previous 1 week until today when she was noted to have tonic-clonic seizure activity at home and was brought to the emergency room where she had another episode. It started with facial twitching and left arm being tense and continued to full tonic-clonic seizure. Patient continued to have seizures in the emergency room until she received Ativan. Patient did not have any seizure disorder. Most of the history was taken from ER note G on February 2020 which was negative after she noticed some twitching on the right side. She had CT angiogram with 50% in-stent stenosis.  Neurology saw the patient in consult and started patient on Keppra and advised EEG in a.m. consult reviewed.  Hospital Course:  Seizures [facility])  New onset seizure with history of CVA. EEG reading normal. Neurologyconsult appreciated.Continue with Keppra. No further episodes. Follow up with neuro per schedule  History of right MCA status post right MCA stent and encephalomalacia. Continue with ASA/Brilinta. Seems to have continued deficits. Evaluated by PT and HH PT recommended  History of hypertension. BP on low end of normal. Not on any medications  Diabetes mellitus. A1c pending at discharge. Continue home oral agents  Hyperlipidemia Continue with statin  obesity     Observations/Objective:  A&Ox3   Assessment and Plan: 1. Seizures ([name]) No further episodes.  Keep appt with neurology and continue keppra  2. Depression,  unspecified depression type Resume zoloft since it seemed to be helping.  No acute safety concerns.  Reassured patient.  Stress management/depression care reviewed.  I will have our social worker follow-up with her.    3. Diabetes mellitus type 2 in obese (HCC) Eliminate sugars from diet.  Adhere to medication regimen.  Check blood sugars  bid-new glucometer sent  4. Hospital discharge follow-up Improving overall; no seizure  5. Language barrier pacific interpreters used and additional time performing visit was required.     Follow Up Instructions: 2-3 weeks with PCP;  Sooner if needed.     I discussed the assessment and treatment plan with the patient. The patient was provided an opportunity to ask questions and all were answered. The patient agreed with the plan and demonstrated an understanding of the instructions.   The patient was advised to call back or seek an in-person evaluation if the symptoms worsen or if the condition fails to improve as anticipated.  I provided 24 minutes of non-face-to-face time during this encounter.  Georgian Co, PA-C  Patient ID: Heather [name], female   DOB: 1956-04-16, 63 y.o.   MRN: [id number]

## 2019-03-27 NOTE — Progress Notes (Signed)
Has concerns about the way Keppra makes her feel- dizzy and nausea  Would like to start back on zoloft- feels down and depressed- has   Had seizure on Saturday after a BBQ and having had a few alcoholic beverages

## 2019-04-11 ENCOUNTER — Ambulatory Visit: Payer: Self-pay | Admitting: Licensed Clinical Social Worker

## 2019-04-12 ENCOUNTER — Other Ambulatory Visit: Payer: Self-pay

## 2019-04-12 ENCOUNTER — Ambulatory Visit: Payer: Self-pay | Attending: Internal Medicine | Admitting: Internal Medicine

## 2019-04-12 ENCOUNTER — Encounter: Payer: Self-pay | Admitting: Internal Medicine

## 2019-04-12 VITALS — BP 115/81

## 2019-04-12 DIAGNOSIS — F32A Depression, unspecified: Secondary | ICD-10-CM

## 2019-04-12 DIAGNOSIS — F329 Major depressive disorder, single episode, unspecified: Secondary | ICD-10-CM

## 2019-04-12 DIAGNOSIS — G40909 Epilepsy, unspecified, not intractable, without status epilepticus: Secondary | ICD-10-CM

## 2019-04-12 MED ORDER — LEVETIRACETAM 500 MG PO TABS: 500.0000 mg | ORAL_TABLET | Freq: Two times a day (BID) | ORAL | 5 refills | Status: DC

## 2019-04-12 MED ORDER — ESCITALOPRAM OXALATE 10 MG PO TABS: ORAL_TABLET | ORAL | 1 refills | Status: DC

## 2019-04-12 NOTE — Progress Notes (Signed)
Virtual Visit via Telephone Note Due to current restrictions/limitations of in-office visits due to the COVID-19 pandemic, this scheduled clinical appointment was converted to a telehealth visit  I connected with Heather [name] on 04/12/19 at 2:28 p.m by telephone and verified that I am speaking with the correct person using two identifiers. I am in my office.  The patient is at home.  The patient, myself and [id number]) from [facility] interpreters participated in this encounter.  I discussed the limitations, risks, security and privacy concerns of performing an evaluation and management service by telephone and the availability of in person appointments. I also discussed with the patient that there may be a patient responsible charge related to this service. The patient expressed understanding and agreed to proceed.  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  Our last telemed visit was 02/05/2019.  This is a hosp f/u appt   Pt hosp 6/20-22/2020 with new onset sz.  Sounds like she had a partial sz that progressed to grand mild sz.  CT of the head revealed no new acute findings.  She also had MRI of the head.  EEG was normal.  Patient discharged on Keppra.  She had a telemedicine visit with PA on 624/2020.  During that visit she reported feeling hopeless and depressed associated with new diagnosis of seizure and COVID pandemic.  She was restarted on Zoloft which she had been on in the past.  Today: She states she "feels down" and fatigue.  Lack interest in doing anything. Restarted on Zoloft by our PA 03/27/2019.  Initially she said that she had stopped taking it because  she felt the med was not helping and making her feel more depressed.  However on further conversation with her, she tells me that she is still taking the Zoloft.  Denies suicidal ideation but states when she has a knife in  her hand in her kitchen she gets scare and drops it.  She recently hid the knife because she gets scared and nervous when she looks at it.  When asked whether she is afraid of hurting herself, patient states that she would not do that but she gets weird thoughts in her head. -use to go for a walk every afternoon prior to dx of sz but now she is afraid to do so for fear that she may have a sz again.  SZ:  Taking Keppra as prescribed.  No sz since hosp but feels her LT side is shaky at times. Has appt with neurologist 03/27/2019 which he intends to keep.  She wants to know what may have caused the seizure   Observations/Objective: Lab Results  Component Value Date   HGBA1C 5.4 03/24/2019   Lab Results  Component Value Date   WBC 9.5 03/23/2019   HGB 15.3 (H) 03/23/2019   HCT 45.0 03/23/2019   MCV 92.8 03/23/2019   PLT 268 03/23/2019     Chemistry      Component Value Date/Time   NA 141 03/23/2019 1308   NA 141 10/12/2018 1710   K 4.8 03/23/2019 1308   CL 108 03/23/2019 1308   CO2 21 (L) 03/23/2019 1250   BUN 13 03/23/2019 1308   BUN 16 10/12/2018 1710   CREATININE 0.40 (L) 03/23/2019 1308   CREATININE 0.53 09/05/2016 1017      Component Value Date/Time   CALCIUM 9.1 03/23/2019 1250   ALKPHOS 71  03/23/2019 1250   AST 27 03/23/2019 1250   ALT 27 03/23/2019 1250   BILITOT 0.7 03/23/2019 1250     Depression screen [facility] 2/9 03/27/2019 10/15/2018 08/09/2018  Decreased Interest 1 0 0  Down, Depressed, Hopeless 1 0 0  PHQ - 2 Score 2 0 0  Altered sleeping 1 - -  Tired, decreased energy 0 - -  Change in appetite 2 - -  Feeling bad or failure about yourself  2 - -  Trouble concentrating - - -  Moving slowly or fidgety/restless - - -  Suicidal thoughts 2 - -  PHQ-9 Score 9 - -     Assessment and Plan: 1. Moderate depressive disorder -Advised the patient to discontinue the Zoloft.  She would like to be tried with a different medication.  We will try her with Lexapro instead.   advised patient that if she develops feeling of wanting to hurt herself or anyone else she should call us or be seen in the emergency room immediately.  I will have our LCSW follow-up with this patient - escitalopram (LEXAPRO) 10 MG tablet; Take 1/2 tab PO daily x 2 wks then 1 tab daily  Dispense: 30 tablet; Refill: 1  2. Seizure disorder [facility]) Advised patient that seizure may have occurred due to past history of CVA.  However a lot of times because may not be found.  Encouraged to keep appointment with neurology later this month.  I will have her come to the lab for [place] to check a Keppra level -I encourage patient to continue walking for exercise.  However because she is afraid to walk alone anymore, understandably so, I recommend walking with a friend or family member. -Patient currently does not drive.  Advised that she would need to be seizure-free for at least 6 to 12 months before driving.  She tells me that she does not have a driver's license and does not intend to drive. - levETIRAcetam (KEPPRA) 500 MG tablet; Take 1 tablet (500 mg total) by mouth 2 (two) times daily.  Dispense: 60 tablet; Refill: 5 - Levetiracetam level; Future   Follow Up Instructions: She has an appointment with me in about 1 month   I discussed the assessment and treatment plan with the patient. The patient was provided an opportunity to ask questions and all were answered. The patient agreed with the plan and demonstrated an understanding of the instructions.   The patient was advised to call back or seek an in-person evaluation if the symptoms worsen or if the condition fails to improve as anticipated.  I provided 27 minutes of non-face-to-face time during this encounter.   Jonah Blue, MD

## 2019-04-12 NOTE — Progress Notes (Signed)
Patient verified DOB Patient has taken medication today. Patient has eaten today. Patient has pain in the right shoulder which became more prominent in the past 2 months ago which is worse when waking up and she noticed it after se began Lipitor 4/5 months ago. Patient does not like to take too much tylenol but she has discomfort and tender. Patient experienced a seizure and was seen in the hospital but she is still unclear as to why she experienced it and what to do now. CBG:126 BP: 115/81

## 2019-04-15 ENCOUNTER — Ambulatory Visit: Payer: Self-pay | Attending: Internal Medicine | Admitting: Licensed Clinical Social Worker

## 2019-04-15 ENCOUNTER — Ambulatory Visit: Payer: Self-pay

## 2019-04-15 ENCOUNTER — Other Ambulatory Visit: Payer: Self-pay

## 2019-04-15 DIAGNOSIS — G40909 Epilepsy, unspecified, not intractable, without status epilepticus: Secondary | ICD-10-CM

## 2019-04-15 DIAGNOSIS — F331 Major depressive disorder, recurrent, moderate: Secondary | ICD-10-CM

## 2019-04-15 DIAGNOSIS — F419 Anxiety disorder, unspecified: Secondary | ICD-10-CM

## 2019-04-16 NOTE — BH Specialist Note (Signed)
Integrated Behavioral Health Visit via Telemedicine (Telephone)  04/15/2019 Heather [name] [id number]   Session Start time: 9:05 AM  Session End time: 9:30 AM Total time: 25 minutes  Referring Provider: Dr. Wynetta Emery Type of Visit: Telephonic Patient location: Home [facility] Provider location: Office All persons participating in visit: LCSW, Pt, Interpretor Kirtland Bouchard FO#[id number])  Confirmed patient's address: No  Confirmed patient's phone number: Yes  Any changes to demographics: Yes   Confirmed patient's insurance: Yes  Any changes to patient's insurance: No   Discussed confidentiality: Yes    The following statements were read to the patient and/or legal guardian that are established with the [facility] Provider.  "The purpose of this phone visit is to provide behavioral health care while limiting exposure to the coronavirus (COVID19).  There is a possibility of technology failure and discussed alternative modes of communication if that failure occurs."  "By engaging in this telephone visit, you consent to the provision of healthcare.  Additionally, you authorize for your insurance to be billed for the services provided during this telephone visit."   Patient and/or legal guardian consented to telephone visit: Yes   PRESENTING CONCERNS: Patient and/or family reports the following symptoms/concerns: Pt reports recent diagnosis of stroke after hospitalization in February 2020. She has been having difficulty managing mental health concerns Duration of problem: 5 months; Severity of problem: severe  STRENGTHS (Protective Factors/Coping Skills): Pt has strong support system that consists of family Pt is willing to comply with medication management  GOALS ADDRESSED: Patient will: 1.  Reduce symptoms of: anxiety and depression  2.  Increase knowledge and/or ability of: coping skills and healthy habits  3.  Demonstrate ability to: Increase healthy adjustment to current life  circumstances, Increase motivation to adhere to plan of care and Improve medication compliance  INTERVENTIONS: Interventions utilized:  Solution-Focused Strategies, Supportive Counseling and Psychoeducation and/or Health Education Standardized Assessments completed: Not Needed  ASSESSMENT: Patient currently experiencing symptoms of depression and anxiety triggered by recent hospitalization due to stroke. Pt reports sadness, hopelessness, difficulty sleeping, loss of appetite, and decreased motivation. She receives strong support system from family and identifies grandchildren as protective factors. No report of suicidal or homicidal ideations.    Patient may benefit from psychotherapy. LCSW educated pt on the correlation between one's physical and mental health. Pt's feelings were validated and encouragement was provided. LCSW discussed the importance of medication compliance to assist in the management of health conditions.   Pt shared that she has been taking her Keppra for the last two weeks and reports "feeling better" She plans on obtaining Lexapro this week. Therapeutic strategies were also discussed to assist with decrease in symptoms. Pt prefers to try medication before initiating long term counseling.  PLAN: 1. Follow up with behavioral health clinician on : Pt was encouraged to contact LCSW if symptoms continue or worsen to schedule follow up appointment 2. Behavioral recommendations: LCSW encourages pt to utilize strategies discussed and comply with medication management. 3. Referral(s): [place] (In Clinic)  Rebekah Chesterfield,  04/16/2019 9:54 AM

## 2019-04-17 LAB — LEVETIRACETAM LEVEL: Levetiracetam Lvl: 22.9 ug/mL (ref 10.0–40.0)

## 2019-04-19 ENCOUNTER — Telehealth: Payer: Self-pay

## 2019-04-19 NOTE — Telephone Encounter (Signed)
[facility] interpre[employer] Benjamine Mola  Id# [phone]  contacted pt to go over lab results pt didn't answer left a detailed vm informing pt of results and if she has any questions or concerns to give me a call

## 2019-04-25 ENCOUNTER — Telehealth: Payer: Self-pay | Admitting: Internal Medicine

## 2019-04-25 NOTE — Telephone Encounter (Signed)
[place] interpreters [employer]r  Id# [phone]  contacted pt to go over lab results pt didn't answer left a detailed vm informing pt of results and if she has any questions or concerns to give me a call   If pt calls back please make pt aware: the level of the seizure medication in her blood is good.

## 2019-04-25 NOTE — Telephone Encounter (Signed)
Pt would would like to be called to go over her lab results.. please follow up

## 2019-05-01 ENCOUNTER — Telehealth: Payer: Self-pay

## 2019-05-01 ENCOUNTER — Other Ambulatory Visit: Payer: Self-pay

## 2019-05-01 ENCOUNTER — Ambulatory Visit: Payer: Self-pay | Attending: Internal Medicine | Admitting: Licensed Clinical Social Worker

## 2019-05-01 DIAGNOSIS — F329 Major depressive disorder, single episode, unspecified: Secondary | ICD-10-CM

## 2019-05-01 DIAGNOSIS — F419 Anxiety disorder, unspecified: Secondary | ICD-10-CM

## 2019-05-01 NOTE — Telephone Encounter (Signed)
Called patient using [facility] at [place] request to resolve a confusion with her medications, she was out of her anti-depressant and unsure of what to do. Pt advised of brand and generic name of her new anti-depressant (lexapro/escitalopram) and still insisted that she never received that medication from the pharmacy. I went a head and refilled the medication and let the pharmacy know she will be here after 2pm to pick it up, I also had the interpreter confirm with her that she knows NOT to take both sertraline (her old med) and escitalopram, since the sertraline was recently picked up on 04/18/19. The patient quickly responded that she knew not to take them together, she was just worried about getting the new medicine.

## 2019-05-02 ENCOUNTER — Other Ambulatory Visit: Payer: Self-pay

## 2019-05-02 NOTE — Progress Notes (Signed)
Integrated Behavioral Health Visit via Telemedicine (Telephone)  05/01/2019 Camaya [employer]ett [id number]   Session Start time: 10:55 AM  Session End time: 11:05 AM Total time: 15 minutes  Referring Provider: Dr. Wynetta Emery Type of Visit: Telephonic Patient location: Home [facility] Provider location: Office All persons participating in visit: Interpretor Rosemarie Ax #[id number]), Pt, and LCSW  Confirmed patient's address: Yes  Confirmed patient's phone number: Yes  Any changes to demographics: No   Confirmed patient's insurance: Yes  Any changes to patient's insurance: No   Discussed confidentiality: No    The following statements were read to the patient and/or legal guardian that are established with the [facility] Provider.  "The purpose of this phone visit is to provide behavioral health care while limiting exposure to the coronavirus (COVID19).  There is a possibility of technology failure and discussed alternative modes of communication if that failure occurs."  "By engaging in this telephone visit, you consent to the provision of healthcare.  Additionally, you authorize for your insurance to be billed for the services provided during this telephone visit."   Patient and/or legal guardian consented to telephone visit: Yes   PRESENTING CONCERNS: Patient and/or family reports the following symptoms/concerns: Pt reports decrease in depression and anxiety symptoms.  Duration of problem: Ongoing; Severity of problem: mild  STRENGTHS (Protective Factors/Coping Skills): Pt is participating in medication management Pt has a support system  GOALS ADDRESSED: Patient will: 1.  Reduce symptoms of: anxiety and depression  2.  Increase knowledge and/or ability of: self-management skills  3.  Demonstrate ability to: Improve medication compliance  INTERVENTIONS: Interventions utilized:  Solution-Focused Strategies Standardized Assessments completed: Not Needed  ASSESSMENT: Patient  currently experiencing depression and anxiety triggered by change in medical condition decreasing her independence. Pt shared that she has been compliant with medication management and has observed a decrease in depression symptoms. Pt denies any active suicidal ideations or thoughts of self-harm. LCSW commended pt for compliance with healthy coping skills and medications. No additional concerns noted.   PLAN: 1. Follow up with behavioral health clinician on : Schedule appointment if symptoms increase 2. Behavioral recommendations: Continue to practice healthy coping skills, strengthen support system, and comply with medication management 3. Referral(s): [place] (In Clinic)  Rebekah Chesterfield 05/03/2019 12:27 PM

## 2019-05-09 ENCOUNTER — Other Ambulatory Visit: Payer: Self-pay

## 2019-05-09 ENCOUNTER — Ambulatory Visit: Payer: Self-pay | Attending: Internal Medicine | Admitting: Internal Medicine

## 2019-05-09 DIAGNOSIS — H9201 Otalgia, right ear: Secondary | ICD-10-CM

## 2019-05-09 DIAGNOSIS — F32A Depression, unspecified: Secondary | ICD-10-CM

## 2019-05-09 DIAGNOSIS — M542 Cervicalgia: Secondary | ICD-10-CM

## 2019-05-09 DIAGNOSIS — F329 Major depressive disorder, single episode, unspecified: Secondary | ICD-10-CM

## 2019-05-09 DIAGNOSIS — R58 Hemorrhage, not elsewhere classified: Secondary | ICD-10-CM

## 2019-05-09 MED ORDER — AMOXICILLIN 500 MG PO CAPS: 500.0000 mg | ORAL_CAPSULE | Freq: Three times a day (TID) | ORAL | 0 refills | Status: DC

## 2019-05-09 MED ORDER — ESCITALOPRAM OXALATE 10 MG PO TABS: 10.0000 mg | ORAL_TABLET | Freq: Every day | ORAL | 1 refills | Status: DC

## 2019-05-09 MED ORDER — DICLOFENAC SODIUM 1 % TD GEL: 2.0000 g | Freq: Two times a day (BID) | TRANSDERMAL | 1 refills | Status: DC | PRN

## 2019-05-09 NOTE — Progress Notes (Signed)
Virtual Visit via Telephone Note Due to current restrictions/limitations of in-office visits due to the COVID-19 pandemic, this scheduled clinical appointment was converted to a telehealth visit  I connected with Heather [name] on 05/09/19 at 4:57 p.m by telephone and verified that I am speaking with the correct person using two identifiers. I am in my office.  The patient is at home.  Only the patient, myself  And Roscio from [facility] interpreters [phone]) participated in this encounter.  I discussed the limitations, risks, security and privacy concerns of performing an evaluation and management service by telephone and the availability of in person appointments. I also discussed with the patient that there may be a patient responsible charge related to this service. The patient expressed understanding and agreed to proceed.   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  Last evaluated 1 month ago   C/o intermittent pain in RT ear x 2-3 days. Rates pain 8/10.  Goes to throat.  No fever or headache.  C/o pain in RT side of neck and shoulder x 1 mth.  No radiation down arm.  No numbness or tingling.   Started after sz in 03/2019.  Takes Tylenol which helps but she is afraid to take it every day.  Depression: Reports she is doing much better on Lexapro.  She tells me that her bottle states that she should take it twice a day.  The instruction that should have been on the bottle based on the prescription that I wrote was 10 mg half a tablet daily for the first 2 weeks and then 1 tablet daily thereafter.  Reports some small bruising on the extremities if she bumps into anything since being on Brilinta.  I asked whether she has any large bruising and she said no but has small ones on her legs. Outpatient Encounter Medications as of 05/09/2019  Medication Sig  . acetaminophen (TYLENOL) 500  MG tablet Take 1,000 mg by mouth every 8 (eight) hours as needed for moderate pain or headache.  Marland Kitchen aspirin 81 MG chewable tablet Chew 1 tablet (81 mg total) by mouth daily.  Marland Kitchen atorvastatin (LIPITOR) 10 MG tablet Take 1 tablet (10 mg total) by mouth daily at 6 PM.  . Blood Glucose Monitoring Suppl (TRUE METRIX METER) DEVI 1 kit by Does not apply route 4 (four) times daily.  . Cholecalciferol (VITAMIN D3 PO) Take 1 tablet by mouth daily.  Marland Kitchen escitalopram (LEXAPRO) 10 MG tablet Take 1/2 tab PO daily x 2 wks then 1 tab daily  . glimepiride (AMARYL) 1 MG tablet Take 1 tablet (1 mg total) by mouth daily with breakfast.  . glucose blood (TRUE METRIX BLOOD GLUCOSE TEST) test strip Use as instructed  . levETIRAcetam (KEPPRA) 500 MG tablet Take 1 tablet (500 mg total) by mouth 2 (two) times daily.  . polyethylene glycol powder (GLYCOLAX/MIRALAX) powder Take 17 g by mouth daily as needed. (Patient taking differently: Take 17 g by mouth daily as needed for moderate constipation. )  . ticagrelor (BRILINTA) 90 MG TABS tablet Take 1 tablet (90 mg total) by mouth 2 (two) times daily.  . TRUEplus Lancets 28G MISC 28 g by Does not apply route QID.   No facility-administered encounter medications on file as of 05/09/2019.     Observations/Objective: No direct observation done as this was a tele visit  Assessment and Plan: 1. Neck pain, acute Advised  patient that she can take the Tylenol up to 3 times a day if needed.  We will give her some diclofenac's gel to use also.  Follow-up if no improvement - diclofenac sodium (VOLTAREN) 1 % GEL; Apply 2 g topically 2 (two) times daily as needed.  Dispense: 100 g; Refill: 1  2. Right ear pain We will treat empirically for otitis media - amoxicillin (AMOXIL) 500 MG capsule; Take 1 capsule (500 mg total) by mouth 3 (three) times daily.  Dispense: 21 capsule; Refill: 0  3. Moderate depressive disorder Advised patient that she should take Lexapro 10 mg once a day.  She  reports improvement on the medication. - escitalopram (LEXAPRO) 10 MG tablet; Take 1 tablet (10 mg total) by mouth daily.  Dispense: 30 tablet; Refill: 1  4. Ecchymosis - CBC; Future   Follow Up Instructions: 3 mth or sooner if any problems occur   I discussed the assessment and treatment plan with the patient. The patient was provided an opportunity to ask questions and all were answered. The patient agreed with the plan and demonstrated an understanding of the instructions.   The patient was advised to call back or seek an in-person evaluation if the symptoms worsen or if the condition fails to improve as anticipated.  I provided 23 minutes of non-face-to-face time during this encounter.   Karle Plumber, MD

## 2019-05-17 ENCOUNTER — Other Ambulatory Visit: Payer: Self-pay | Admitting: Internal Medicine

## 2019-05-17 ENCOUNTER — Other Ambulatory Visit: Payer: Self-pay

## 2019-05-17 MED ORDER — GLIMEPIRIDE 1 MG PO TABS: 1.0000 mg | ORAL_TABLET | Freq: Every day | ORAL | 5 refills | Status: DC

## 2019-05-21 ENCOUNTER — Encounter: Payer: Self-pay | Admitting: Adult Health

## 2019-05-27 ENCOUNTER — Ambulatory Visit: Payer: Self-pay | Admitting: Adult Health

## 2019-05-29 ENCOUNTER — Ambulatory Visit (INDEPENDENT_AMBULATORY_CARE_PROVIDER_SITE_OTHER): Payer: Self-pay | Admitting: Adult Health

## 2019-05-29 ENCOUNTER — Other Ambulatory Visit: Payer: Self-pay

## 2019-05-29 ENCOUNTER — Encounter: Payer: Self-pay | Admitting: Adult Health

## 2019-05-29 VITALS — BP 120/88 | HR 68 | Temp 96.9°F | Ht 68.0 in | Wt 209.6 lb

## 2019-05-29 DIAGNOSIS — I69398 Other sequelae of cerebral infarction: Secondary | ICD-10-CM

## 2019-05-29 DIAGNOSIS — I63311 Cerebral infarction due to thrombosis of right middle cerebral artery: Secondary | ICD-10-CM

## 2019-05-29 DIAGNOSIS — E1169 Type 2 diabetes mellitus with other specified complication: Secondary | ICD-10-CM

## 2019-05-29 DIAGNOSIS — E669 Obesity, unspecified: Secondary | ICD-10-CM

## 2019-05-29 DIAGNOSIS — F331 Major depressive disorder, recurrent, moderate: Secondary | ICD-10-CM

## 2019-05-29 DIAGNOSIS — R569 Unspecified convulsions: Secondary | ICD-10-CM

## 2019-05-29 DIAGNOSIS — I10 Essential (primary) hypertension: Secondary | ICD-10-CM

## 2019-05-29 MED ORDER — LACOSAMIDE 50 MG PO TABS: ORAL_TABLET | ORAL | 0 refills | Status: DC

## 2019-05-29 NOTE — Patient Instructions (Addendum)
Your Plan:  Due to potential side effects since starting Keppra, we will attempt to switch medication  You will start Vimpat 50 mg twice daily and continue this for 2 weeks in addition to your Keppra After 2 weeks, decrease Keppra dose to 500 mg daily and increase Vimpat dose to 100 mg twice daily Continue this regimen for an additional 2 weeks and at that time you will discontinue Keppra and increase Vimpat to 150mg  twice daily   Follow-up in 2 months or call earlier if needed     Thank you for coming to see [place] at [facility]. I hope we have been able to provide you high quality care today.  You may receive a patient satisfaction survey over the next few weeks. We would appreciate your feedback and comments so that we may continue to improve ourselves and the health of our patients.

## 2019-05-29 NOTE — Progress Notes (Signed)
[facility] [address] [place]. [place] [address] [phone]       OFFICE FOLLOW-UP NOTE  Heather [name] Date of Birth:  [birth year] Medical Record Number:  [id number]   Chief Complaint  Patient presents with   Follow-up     treatment room, with interpreter. Cerebral infarction/convulsions. ( thinks she has a allergy to meds)     HPI: Heather [name] is a 63 year old pleasant Hispanic lady who seen today for first office follow-up visit following hospital admission for stroke in March 2019. She is accompanied by her daughter as well as Spanish language interpreter was present throughout this visit. History is obtained from them as well as review of electronic medical records. I have personally reviewed imaging films.Heather [name] an 63 y.o.femaleHispanic with PMH of HTN,Obesity who presents Heather [name] emergency room as a stroke alert with left hemiparesis. Patient is Spanish-speaking and initially EMS was told that her symptoms began at 9 PM after she dropped a glass in her left hand. CT head showed no hemorrhage and tPA was mixed. However after nurse spoke to her in [place], patient stated that she had a headache that began around 6:30pm and had left side weakness as she was outside window period upon completion of CT scan.  CTA head and neck was performed which showed a superior division M2 cutoff. After discussing with family given her borderline NIHSS of 7, however worsening symptoms ( from EMS assessment to arrival at hospital) we decided to take the patient for mechanical thrombectomy. Date last known well:3.17.19Time last known well:around 6.30 pm tPA Given:no, outside window at time of CT scan NIHSS:7 ( at time of arrival) Baseline MRS0  . After written informed consent from the patient and family She was taken to the interventional radiology suite for thrombectomy and right middle cerebral artery  Rescue stent placement performed by Dr. Estanislado Pandy for  progressive occlusion of the inferior division of the right middle cerebral artery due to underlying stenosis. She also received 10 mg of superselective into arterial Integrilin. She was given loading dose of 180 mg of Brilinta and aspirin. The patient was initially and in the neurological intensive care unit and closely monitored she did well. MRI scan of the brain showed a moderate-sized right MCA infarct.Marland Kitchen LDL cholesterol 67 mg percent and women A1c was 6.7. Transthoracic echo showed normal ejection fraction. Patient had initial right gaze preference and left hemiplegia but made gradual improvement. She was seen by physical occupational and speech therapy. She was transferred to inpatient rehabilitation and has done well.   05/16/2018 visit: She is currently living at home. She is able to ambulate without assistance. She has only mild diminished fine motor skills in the left hand but otherwise appears to have made a full recovery. She feels she is 85% better. She does complain of decreased stamina and gets tired easily. She is eating healthy and is active and in fact has lost weight. Her fasting sugars have all ranged below 150 and blood pressure is well controlled and it is 106/80 today. She did undergo follow-up cerebral catheter angiogram by Dr. Estanislado Pandy on 03/06/18 which I have personally reviewed showed 50% right middle cerebral artery IntraStent restenosis but good flow distally.  Interval history 08/16/2018: patient is being seen today for follow up and is accompanied by interpreter and daughter. She states she has been doing well. She denies any residual left hand weakness but does endorse pain in left wrist intermittently along with finger joints.  Comes quick  and then goes away.  She also endorses right sided intermittent neck pain that has been present since Friday.  She has tried 1 dose of Tylenol which did help but has not tried again.  She denies any other, neurological deficit and states this  neck pain is not debilitating.  Denies any type of trauma or injury.  She did experience this after hospital discharge but subsided and has recently returned.  Denies any type of headache, migraine, visual loss or dizziness.  Continues on aspirin and brilinta without side effects of bleeding but does endorse easily bruising. Glucose levels have been stable as she does monitor at home. Blood pressure today 99/71. She will be following up with Dr. Estanislado Pandy next month for repeat imaging and length of DAPT will be determined at that time.  She also endorses approximately 50 pound weight loss since her stroke from diet and exercise.  No further concerns at this time.  Denies new or worsening stroke/TIA symptoms. Update 11/26/2018 : She returns for follow-up after last visit 3 months ago.  She is accompanied by a [place] language interpreter who was present throughout this visit.  Patient was recently hospitalized to [facility] on 11/19/2017 and I personally reviewed hospital records and imaging films.  She woke up that day and felt her face was drawing up with some twitching's on the right side which lasted 10 minutes.  Her symptoms cleared by the time she reached the hospital.  CT angiogram of the brain was obtained emergently which showed 50% IntraStent stenosis.  She was admitted for overnight observation and underwent MRI scan of the brain which showed no acute stroke.  She underwent diagnostic cerebral catheter angiogram on 11/20/18 by Dr Estanislado Pandy which showed only 30 to 40% IntraStent restenosis with excellent flow distally.  EEG was normal.  Patient symptoms were felt to be of unclear etiology and she was recommended to continue aspirin and Brilinta and maintain aggressive risk factor modification.  She states she is done well since then she has had no recurrent facial twitching or drawing.  She is had no other stroke or TIA symptoms.  She is tolerating aspirin and Brilinta well without bleeding or  bruising.  Her blood pressure is well controlled and today it is 104/79.  She is tolerating Lipitor well without muscle aches and pains.  Her last LDL cholesterol was 87 mg percent on 11/19/2018.  She states her sugars are well controlled and last hemoglobin A1c was 5.4 on 11/19/2018.  She states she is eating healthy and has lost 60 pounds over the last 1 year.  She has no new complaints today.  05/29/2019 update: Heather. Heather [name] is a 63 year old female who is being seen today for follow-up as well as recent onset of seizures on 03/23/2019.  Accompanied at today's visit by interpreter.  Presented to [facility] ED with tonic-clonic seizure activity at home with additional episode in the emergency room with facial twitching, left arm being tense and continued to full tonic-clonic seizure.  She continued to have seizures until she received Ativan and then subsided.  No prior history of seizure disorder.  Initiated Keppra.  EEG normal.  She has had no recurrent seizure activity since this time.  She does report decreased appetite, fatigue and worsening depression since initiating Keppra.  She has recently been switched antidepressants with mild improvement but continues to experience above symptoms.  She does have increased anxiety regarding recent seizure activity and possibility of recurrent seizure activity.  She continues  to live with her granddaughter but is able to maintain ADLs independently.  She also continues to have right-sided neck pain radiating to shoulder which was recently evaluated by PCP who recommended follow-up if no benefit with conservative measures.  She has not followed up at this time.  Continues on aspirin and Brilinta without bleeding or bruising.  Continues on atorvastatin without myalgias.  Blood pressure stable today at 120/88.  No further concerns at this time.   ROS:   14 system review of systems is positive for seizure and weakness and all other systems negative  PMH:  Past Medical History:    Diagnosis Date   Depression    Diabetes ([place])    Hypertension    Seizure ([place])    Stroke ([place])     Social History:  Social History   Socioeconomic History   Marital status: Single    Spouse name: Not on file   Number of children: Not on file   Years of education: Not on file   Highest education level: Not on file  Occupational History   Not on file  Social Needs   Financial resource strain: Not on file   Food insecurity    Worry: Not on file    Inability: Not on file   Transportation needs    Medical: Not on file    Non-medical: Not on file  Tobacco Use   Smoking status: Former Smoker    Packs/day: 0.50    Years: 30.00    Pack years: 15.00    Types: Cigarettes    Quit date: 12/04/2017    Years since quitting: 1.4   Smokeless tobacco: Never Used  Substance and Sexual Activity   Alcohol use: Not Currently    Comment: weekly   Drug use: No   Sexual activity: Not Currently    Birth control/protection: None  Lifestyle   Physical activity    Days per week: Not on file    Minutes per session: Not on file   Stress: Not on file  Relationships   Social connections    Talks on phone: Not on file    Gets together: Not on file    Attends religious service: Not on file    Active member of club or organization: Not on file    Attends meetings of clubs or organizations: Not on file    Relationship status: Not on file   Intimate partner violence    Fear of current or ex partner: Not on file    Emotionally abused: Not on file    Physically abused: Not on file    Forced sexual activity: Not on file  Other Topics Concern   Not on file  Social History Narrative   Not on file    Medications:   Current Outpatient Medications on File Prior to Visit  Medication Sig Dispense Refill   acetaminophen (TYLENOL) 500 MG tablet Take 1,000 mg by mouth every 8 (eight) hours as needed for moderate pain or headache.     aspirin 81 MG chewable tablet Chew 1  tablet (81 mg total) by mouth daily.     atorvastatin (LIPITOR) 10 MG tablet Take 1 tablet (10 mg total) by mouth daily at 6 PM. 90 tablet 3   Blood Glucose Monitoring Suppl (TRUE METRIX METER) DEVI 1 kit by Does not apply route 4 (four) times daily. 1 Device 0   Cholecalciferol (VITAMIN D3 PO) Take 1 tablet by mouth daily.     escitalopram (LEXAPRO)  10 MG tablet Take 1 tablet (10 mg total) by mouth daily. 30 tablet 1   glimepiride (AMARYL) 1 MG tablet Take 1 tablet (1 mg total) by mouth daily with breakfast. 30 tablet 5   glucose blood (TRUE METRIX BLOOD GLUCOSE TEST) test strip Use as instructed 100 each 11   levETIRAcetam (KEPPRA) 500 MG tablet Take 1 tablet (500 mg total) by mouth 2 (two) times daily. 60 tablet 5   polyethylene glycol powder (GLYCOLAX/MIRALAX) powder Take 17 g by mouth daily as needed. (Patient taking differently: Take 17 g by mouth daily as needed for moderate constipation. ) 3350 g 1   ticagrelor (BRILINTA) 90 MG TABS tablet Take 1 tablet (90 mg total) by mouth 2 (two) times daily. 60 tablet 3   TRUEplus Lancets 28G MISC 28 g by Does not apply route QID. 120 each 3   No current facility-administered medications on file prior to visit.     Allergies:  No Known Allergies  Physical Exam General: Mildly obese pleasant middle-age  Hispanic Spanish-speaking lady, seated, in no evident distress Head: head normocephalic and atraumatic.  Neck: supple with no carotid or supraclavicular bruits Cardiovascular: regular rate and rhythm, no murmurs Musculoskeletal: no deformity Skin:  no rash/petichiae Vascular:  Normal pulses all extremities Vitals:   05/29/19 0948  BP: 120/88  Pulse: 68  Temp: (!) 96.9 F (36.1 C)   Neurologic Exam Mental Status: Awake and fully alert. Oriented to place and time. Recent and remote memory intact. Attention span, concentration and fund of knowledge appropriate. Mood and affect appropriate.  Cranial Nerves: Fundoscopic exam not  done. Pupils equal, briskly reactive to light. Extraocular movements full without nystagmus. Visual fields full to confrontation. Hearing intact. Facial sensation intact. Face, tongue, palate moves normally and symmetrically.  Motor: Normal bulk and tone. Normal strength in all tested extremity muscles.  Mild diminished fine finger movements on the left.  Sensory.: intact to touch ,pinprick .position and vibratory sensation.  Coordination: Rapid alternating movements normal in all extremities. Finger-to-nose and heel-to-shin performed accurately bilaterally. Gait and Station: Arises from chair without difficulty. Stance is normal. Gait demonstrates normal stride length and balance . Able to heel, toe and tandem walk without difficulty.  Reflexes: 1+ and symmetric. Toes downgoing.      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 Vascular risk factors of intracranial stenosis, diabetes, hypertension and hyperlipidemia.  Prior hospitalization 11/18/2018 with transient facial twitchings of unclear etiology with negative EEG and negative neuroimaging for recurrent stroke or significant restenosis of intracranial stent.  Presented to ED on 03/23/2019 with tonic-clonic seizure activity with Keppra initiated.  She has not had any recurrent seizure activity or symptoms since initiating Keppra but has been experiencing decreased appetite, fatigue and worsening depression since initiating Keppra.     PLAN: -Due to potential Keppra side effects, it will be recommended to change therapies at this time. -Recommend initiating Vimpat 50 mg twice daily in addition to Keppra 500 mg twice daily for 2 weeks.  She will then decrease Keppra dose to 500 mg daily and increase Vimpat dose to 100 mg twice daily for a total of 2 weeks.  After that time, she will discontinue Keppra and  increase Vimpat dose to 150 mg twice daily.  Interpreter assisted with providing patient with these instructions who verbalized understanding -Advised to continue to follow with PCP for ongoing depression management -  Continue aspirin 81 mg and Brilinta and atorvastatin 10 mg daily for secondary stroke prevention -Continue to follow with Dr. Estanislado Pandy for ongoing monitoring and follow-up regarding intracranial stent -Continue to follow with PCP for HTN, HLD and DM management -maintain strict control of hypertension with blood pressure goal below 130/90, diabetes with hemoglobin A1c goal below 6.5% and lipids with LDL cholesterol goal below 70 mg/dL. I also advised the patient to eat a healthy diet with plenty of whole grains, cereals, fruits and vegetables, exercise regularly and maintain ideal body weight  Follow-up in 2 months or call earlier if needed  Greater than 50% of time during this 25 minute visit was spent on counseling, discussion regarding recent seizure onset likely related to prior right MCA stroke, explanation of diagnosis of intracranials tenosis, stenting, stroke, planning of further management, discussion with patient and answering all questions to patient satisfaction    Frann Rider [facility]), AGNP-BC  [facility] [address] [place], [place] [address]  Phone [phone] Fax [phone] Note: This document was prepared with digital dictation and possible smart phrase technology. Any transcriptional errors that result from this process are unintentional.

## 2019-05-29 NOTE — Progress Notes (Signed)
I agree with the above plan 

## 2019-05-30 ENCOUNTER — Ambulatory Visit: Payer: Self-pay | Attending: Family Medicine | Admitting: Physician Assistant

## 2019-05-30 ENCOUNTER — Other Ambulatory Visit: Payer: Self-pay

## 2019-05-30 DIAGNOSIS — F32A Depression, unspecified: Secondary | ICD-10-CM

## 2019-05-30 DIAGNOSIS — F329 Major depressive disorder, single episode, unspecified: Secondary | ICD-10-CM

## 2019-05-30 DIAGNOSIS — F419 Anxiety disorder, unspecified: Secondary | ICD-10-CM

## 2019-05-30 DIAGNOSIS — R630 Anorexia: Secondary | ICD-10-CM

## 2019-05-30 NOTE — Progress Notes (Signed)
Patient ID: Heather [name], female   DOB: 1956-03-12, 62 y.o.   MRN: [id number] Virtual Visit via Telephone Note  I connected with Heather [name] on 05/30/19 at  3:10 PM EDT by telephone and verified that I am speaking with the correct person using two identifiers.   I discussed the limitations, risks, security and privacy concerns of performing an evaluation and management service by telephone and the availability of in person appointments. I also discussed with the patient that there may be a patient responsible charge related to this service. The patient expressed understanding and agreed to proceed.  Patient location:  home My Location:  [place] office Persons on the call:  Me, the interpreter, and the patient  History of Present Illness:  Patient c/o poor appetite since starting Keppra.  Patient ended up addressing this concern with neurology yesterday and they changed her medication.  She is doing better with depression since starting on citalopram.      Observations/Objective:  A&Ox3   Assessment and Plan: 1. Anxiety and depression Continue citalopram 10mg  daily.    2. Poor appetite Addressed yesterday with neurology.  If continues even after medication change, will f/up with PCP.      Follow Up Instructions: 3 month f/up with PCP   I discussed the assessment and treatment plan with the patient. The patient was provided an opportunity to ask questions and all were answered. The patient agreed with the plan and demonstrated an understanding of the instructions.   The patient was advised to call back or seek an in-person evaluation if the symptoms worsen or if the condition fails to improve as anticipated.  I provided 9 minutes of non-face-to-face time during this encounter.   Freeman Caldron, PA-C

## 2019-06-14 ENCOUNTER — Telehealth: Payer: Self-pay | Admitting: Internal Medicine

## 2019-06-14 NOTE — Telephone Encounter (Signed)
noted 

## 2019-06-14 NOTE — Telephone Encounter (Signed)
Pt has an appointment with Levada Dy on 06/19/19. Maybe pt can be seen in person that day so she can get the fecal and can fill out the form for the mammogram because she doesn't have any insurance. Will forward to [place]

## 2019-06-14 NOTE — Telephone Encounter (Signed)
Patient called stating she would like to get  A mammogram and a colonoscopy. Patient states that colon cancer runs in her family and that now a lot of her family members have colon issues. Please follow up.

## 2019-06-17 NOTE — Telephone Encounter (Signed)
Per appt notes, patient wants to be seen in person for arm pain as well to previous message.

## 2019-06-17 NOTE — Telephone Encounter (Signed)
We should be able to address both of these as a televisit and can send her the mammogram form to fill out.  Thanks, Freeman Caldron, PA-C

## 2019-06-17 NOTE — Telephone Encounter (Signed)
Ok, that's fine if she's insisting on being in person.  Thanks, Freeman Caldron, PA-C

## 2019-06-17 NOTE — Telephone Encounter (Signed)
Heather [name] I see that pt wants to be seen in person.

## 2019-06-18 NOTE — Telephone Encounter (Signed)
Patient is aware 

## 2019-06-19 ENCOUNTER — Other Ambulatory Visit: Payer: Self-pay

## 2019-06-19 ENCOUNTER — Ambulatory Visit: Payer: Self-pay | Attending: Family Medicine | Admitting: Physician Assistant

## 2019-06-19 VITALS — BP 107/76 | HR 69 | Temp 98.4°F | Ht 68.0 in | Wt 212.8 lb

## 2019-06-19 DIAGNOSIS — M542 Cervicalgia: Secondary | ICD-10-CM

## 2019-06-19 DIAGNOSIS — E669 Obesity, unspecified: Secondary | ICD-10-CM

## 2019-06-19 DIAGNOSIS — E1169 Type 2 diabetes mellitus with other specified complication: Secondary | ICD-10-CM

## 2019-06-19 DIAGNOSIS — M79601 Pain in right arm: Secondary | ICD-10-CM

## 2019-06-19 DIAGNOSIS — Z23 Encounter for immunization: Secondary | ICD-10-CM

## 2019-06-19 DIAGNOSIS — M62838 Other muscle spasm: Secondary | ICD-10-CM

## 2019-06-19 DIAGNOSIS — Z789 Other specified health status: Secondary | ICD-10-CM

## 2019-06-19 LAB — GLUCOSE, POCT (MANUAL RESULT ENTRY): POC Glucose: 110 mg/dl — AB (ref 70–99)

## 2019-06-19 MED ORDER — MELOXICAM 7.5 MG PO TABS: 7.5000 mg | ORAL_TABLET | Freq: Every day | ORAL | 1 refills | Status: DC

## 2019-06-19 MED ORDER — METHOCARBAMOL 500 MG PO TABS: 500.0000 mg | ORAL_TABLET | Freq: Three times a day (TID) | ORAL | 0 refills | Status: DC | PRN

## 2019-06-19 NOTE — Progress Notes (Signed)
Patient ID: Heather [name], female   DOB: 1956/05/09, 63 y.o.   MRN: [id number]   Heather [name], is a 63 y.o. female  [id number]:[id number]  [id number]  DOB - October 16, 1955  Subjective:  Chief Complaint and HPI: Heather [name] is a 63 y.o. female here today with several concerns.  She had to restart keppra bc she couldn't afford Vimpat.  Also, she has been having R arm/neck/trapezius soreness and spasm for about 2 months.  She is concerned that this is coming from her cholesterol medication.  But, she started her cholesterol medication in February.  She denies CP/SOB.  She is R hand dominant and does most things with her R arm.  NKI.  Pain improves with tylenol.  Pain exacerbated by movement.  No paresthesias or weakness.    Blood sugar 110 today   ROS:   Constitutional:  No f/c, No night sweats, No unexplained weight loss. EENT:  No vision changes, No blurry vision, No hearing changes. No mouth, throat, or ear problems.  Respiratory: No cough, No SOB Cardiac: No CP, no palpitations GI:  No abd pain, No N/V/D. GU: No Urinary s/sx Musculoskeletal: R arm pain Neuro: No headache, no dizziness, no motor weakness.  Skin: No rash Endocrine:  No polydipsia. No polyuria.  Psych: Denies SI/HI  No problems updated.  ALLERGIES: No Known Allergies  PAST MEDICAL HISTORY: Past Medical History:  Diagnosis Date   Depression    Diabetes ([place])    Hypertension    Seizure ([place])    Stroke [facility])     MEDICATIONS AT HOME: Prior to Admission medications   Medication Sig Start Date End Date Taking? Authorizing Provider  levETIRAcetam (KEPPRA) 500 MG tablet Take 500 mg by mouth 2 (two) times daily.   Yes [provider]  acetaminophen (TYLENOL) 500 MG tablet Take 1,000 mg by mouth every 8 (eight) hours as needed for moderate pain or headache.    [provider]  aspirin 81 MG chewable tablet Chew 1 tablet (81 mg total) by mouth daily. 12/27/17   Angiulli, Lavon Paganini, PA-C  atorvastatin  (LIPITOR) 10 MG tablet Take 1 tablet (10 mg total) by mouth daily at 6 PM. 03/25/19   Black, Lezlie Octave, NP  Blood Glucose Monitoring Suppl (TRUE METRIX METER) DEVI 1 kit by Does not apply route 4 (four) times daily. 03/27/19   Argentina Donovan, PA-C  Cholecalciferol (VITAMIN D3 PO) Take 1 tablet by mouth daily.    [provider]  citalopram (CELEXA) 10 MG tablet Take 10 mg by mouth daily.    [provider]  escitalopram (LEXAPRO) 10 MG tablet Take 1 tablet (10 mg total) by mouth daily. Patient not taking: Reported on 05/30/2019 05/09/19   Ladell Pier, MD  glimepiride (AMARYL) 1 MG tablet Take 1 tablet (1 mg total) by mouth daily with breakfast. 05/17/19   Ladell Pier, MD  glucose blood (TRUE METRIX BLOOD GLUCOSE TEST) test strip Use as instructed 03/27/19   Argentina Donovan, PA-C  meloxicam (MOBIC) 7.5 MG tablet Take 1 tablet (7.5 mg total) by mouth daily. 06/19/19   Argentina Donovan, PA-C  methocarbamol (ROBAXIN) 500 MG tablet Take 1 tablet (500 mg total) by mouth every 8 (eight) hours as needed for muscle spasms. 06/19/19   Argentina Donovan, PA-C  polyethylene glycol powder (GLYCOLAX/MIRALAX) powder Take 17 g by mouth daily as needed. Patient not taking: Reported on 05/30/2019 08/09/18   Ladell Pier, MD  ticagrelor (BRILINTA) 90 MG TABS  tablet Take 1 tablet (90 mg total) by mouth 2 (two) times daily. 03/25/19   Black, Lezlie Octave, NP  TRUEplus Lancets 28G MISC 28 g by Does not apply route QID. 03/27/19   Argentina Donovan, PA-C     Objective:  EXAM:   Vitals:   06/19/19 1016  BP: 107/76  Pulse: 69  Temp: 98.4 F (36.9 C)  TempSrc: Oral  SpO2: 99%  Weight: 212 lb 12.8 oz (96.5 kg)  Height: '5\' 8"'  (1.727 m)    General appearance : A&OX3. NAD. Non-toxic-appearing HEENT: Atraumatic and Normocephalic.  PERRLA. EOM intact.  Neck: supple, no JVD. No cervical lymphadenopathy. No thyromegaly Chest/Lungs:  Breathing-non-labored, Good air entry bilaterally, breath  sounds normal without rales, rhonchi, or wheezing  CVS: S1 S2 regular, no murmurs, gallops, rubs  R arm and neck-full S&ROM.  +tender Trapezius spasm on R.  Normal grip.  No bony/point tenderness. Extremities: Bilateral Lower Ext shows no edema, both legs are warm to touch with = pulse throughout Neurology:  CN II-XII grossly intact, Non focal.   Psych:  TP linear. J/I WNL. Normal speech. Appropriate eye contact and affect.  Skin:  No Rash  Data Review Lab Results  Component Value Date   HGBA1C 5.4 03/24/2019   HGBA1C 5.4 11/19/2018   HGBA1C 5.5 05/08/2018     Assessment & Plan   1. Diabetes mellitus type 2 in obese (HCC) Adequate control.  Continue current regimen and work on diabetic diet/exercise - Glucose (CBG)  2. Right arm pain Believe musculoskeletal - meloxicam (MOBIC) 7.5 MG tablet; Take 1 tablet (7.5 mg total) by mouth daily.  Dispense: 30 tablet; Refill: 1 - DG Shoulder Right; Future  3. Muscle spasm - methocarbamol (ROBAXIN) 500 MG tablet; Take 1 tablet (500 mg total) by mouth every 8 (eight) hours as needed for muscle spasms.  Dispense: 90 tablet; Refill: 0  4. Neck pain - meloxicam (MOBIC) 7.5 MG tablet; Take 1 tablet (7.5 mg total) by mouth daily.  Dispense: 30 tablet; Refill: 1 - DG Cervical Spine Complete; Future  5. Language barrier stratus interpreters used and additional time performing visit was required.   Patient have been counseled extensively about nutrition and exercise  Return in about 2 months (around 08/19/2019) for with PCP.  The patient was given clear instructions to go to ER or return to medical center if symptoms don't improve, worsen or new problems develop. The patient verbalized understanding. The patient was told to call to get lab results if they haven't heard anything in the next week.     Freeman Caldron, PA-C [facility] [place], [place]   06/19/2019, 10:52 AM

## 2019-06-24 ENCOUNTER — Other Ambulatory Visit: Payer: Self-pay

## 2019-06-24 ENCOUNTER — Ambulatory Visit (HOSPITAL_COMMUNITY)
Admission: RE | Admit: 2019-06-24 | Discharge: 2019-06-24 | Disposition: A | Discharge status: Home / Self Care | Payer: Self-pay | Source: Ambulatory Visit | Attending: Physician Assistant | Admitting: Physician Assistant

## 2019-06-24 DIAGNOSIS — M79601 Pain in right arm: Secondary | ICD-10-CM | POA: Insufficient documentation

## 2019-06-24 DIAGNOSIS — M542 Cervicalgia: Secondary | ICD-10-CM | POA: Insufficient documentation

## 2019-06-25 ENCOUNTER — Other Ambulatory Visit: Payer: Self-pay | Admitting: Physician Assistant

## 2019-06-25 DIAGNOSIS — M542 Cervicalgia: Secondary | ICD-10-CM

## 2019-06-26 ENCOUNTER — Other Ambulatory Visit: Payer: Self-pay

## 2019-06-26 ENCOUNTER — Ambulatory Visit: Payer: Self-pay | Attending: Internal Medicine

## 2019-06-27 ENCOUNTER — Telehealth: Payer: Self-pay | Admitting: Internal Medicine

## 2019-06-27 NOTE — Telephone Encounter (Signed)
Patient called wanting to get her results from her arm x-ray which were done at [facility].  please follow up.

## 2019-07-01 NOTE — Telephone Encounter (Signed)
Patient called back for her results. Please follow up

## 2019-07-03 NOTE — Telephone Encounter (Signed)
Patient was called and informed of x-ray results. 

## 2019-07-03 NOTE — Telephone Encounter (Signed)
Forwarded to [employer]

## 2019-07-03 NOTE — Telephone Encounter (Signed)
Patient called to check on the status of her lab results. Please follow up. °

## 2019-07-09 ENCOUNTER — Other Ambulatory Visit: Payer: Self-pay | Admitting: Internal Medicine

## 2019-07-09 ENCOUNTER — Telehealth: Payer: Self-pay | Admitting: Internal Medicine

## 2019-07-09 DIAGNOSIS — F329 Major depressive disorder, single episode, unspecified: Secondary | ICD-10-CM

## 2019-07-09 DIAGNOSIS — F32A Depression, unspecified: Secondary | ICD-10-CM

## 2019-07-09 NOTE — Telephone Encounter (Signed)
Patient came in to check on their orthopedic referral. Please follow up

## 2019-07-10 NOTE — Telephone Encounter (Signed)
I spoke to patient she is aware that she need to apply for Cafa  And after she get the letter . I would be able to refer her to [facility] Thank you .

## 2019-07-11 ENCOUNTER — Telehealth: Payer: Self-pay | Admitting: Internal Medicine

## 2019-07-11 NOTE — Telephone Encounter (Signed)
Pt was sent a letter from financial dept. Inform them, that the application they submitted was incomplete, since they were missing some documentation at the time of the appointment, Pt need to reschedule and resubmit all new papers and application for CAFA and OC, P.S. old documents her documents will be holding until her new appt on 07/17/19.

## 2019-07-17 ENCOUNTER — Ambulatory Visit: Payer: Self-pay

## 2019-07-24 ENCOUNTER — Ambulatory Visit: Payer: Self-pay

## 2019-07-25 ENCOUNTER — Ambulatory Visit: Payer: Self-pay | Admitting: Internal Medicine

## 2019-08-01 ENCOUNTER — Ambulatory Visit: Payer: Self-pay

## 2019-08-12 ENCOUNTER — Ambulatory Visit: Payer: Self-pay | Attending: Internal Medicine

## 2019-08-12 ENCOUNTER — Other Ambulatory Visit: Payer: Self-pay

## 2019-08-13 ENCOUNTER — Ambulatory Visit: Payer: MEDICAID | Admitting: Adult Health

## 2019-08-16 ENCOUNTER — Other Ambulatory Visit: Payer: Self-pay | Admitting: Internal Medicine

## 2019-08-16 DIAGNOSIS — Z1231 Encounter for screening mammogram for malignant neoplasm of breast: Secondary | ICD-10-CM

## 2019-08-21 ENCOUNTER — Ambulatory Visit: Payer: Self-pay | Admitting: Adult Health

## 2019-08-21 ENCOUNTER — Encounter: Payer: Self-pay | Admitting: Adult Health

## 2019-08-21 NOTE — Progress Notes (Deleted)
[facility] [address] [place]. [place] [address] [phone]       OFFICE FOLLOW-UP NOTE  [name] Date of Birth:  11/09/1955 Medical Record Number:  [id number]   No chief complaint on file.    HPI: Ms Heather [name] is a 63 year old pleasant Hispanic lady who seen today for first office follow-up visit following hospital admission for stroke in March 2019. She is accompanied by her daughter as well as Spanish language interpreter was present throughout this visit. History is obtained from them as well as review of electronic medical records. I have personally reviewed imaging films.Heather [name] an 63 y.o.femaleHispanic with PMH of HTN,Obesity who presents [name] emergency room as a stroke alert with left hemiparesis. Patient is Spanish-speaking and initially EMS was told that her symptoms began at 9 PM after she dropped a glass in her left hand. CT head showed no hemorrhage and tPA was mixed. However after nurse spoke to her in [place], patient stated that she had a headache that began around 6:30pm and had left side weakness as she was outside window period upon completion of CT scan.  CTA head and neck was performed which showed a superior division M2 cutoff. After discussing with family given her borderline NIHSS of 7, however worsening symptoms ( from EMS assessment to arrival at hospital) we decided to take the patient for mechanical thrombectomy. Date last known well:3.17.19Time last known well:around 6.30 pm tPA Given:no, outside window at time of CT scan NIHSS:7 ( at time of arrival) Baseline MRS0  . After written informed consent from the patient and family She was taken to the interventional radiology suite for thrombectomy and right middle cerebral artery  Rescue stent placement performed by Dr. Estanislado Pandy for progressive occlusion of the inferior division of the right middle cerebral artery due to underlying stenosis. She also received 10 mg  of superselective into arterial Integrilin. She was given loading dose of 180 mg of Brilinta and aspirin. The patient was initially and in the neurological intensive care unit and closely monitored she did well. MRI scan of the brain showed a moderate-sized right MCA infarct.Marland Kitchen LDL cholesterol 67 mg percent and women A1c was 6.7. Transthoracic echo showed normal ejection fraction. Patient had initial right gaze preference and left hemiplegia but made gradual improvement. She was seen by physical occupational and speech therapy. She was transferred to inpatient rehabilitation and has done well.   05/16/2018 visit: She is currently living at home. She is able to ambulate without assistance. She has only mild diminished fine motor skills in the left hand but otherwise appears to have made a full recovery. She feels she is 85% better. She does complain of decreased stamina and gets tired easily. She is eating healthy and is active and in fact has lost weight. Her fasting sugars have all ranged below 150 and blood pressure is well controlled and it is 106/80 today. She did undergo follow-up cerebral catheter angiogram by Dr. Estanislado Pandy on 03/06/18 which I have personally reviewed showed 50% right middle cerebral artery IntraStent restenosis but good flow distally.  Interval history 08/16/2018: patient is being seen today for follow up and is accompanied by interpreter and daughter. She states she has been doing well. She denies any residual left hand weakness but does endorse pain in left wrist intermittently along with finger joints.  Comes quick and then goes away.  She also endorses right sided intermittent neck pain that has been present since Friday.  She has  tried 1 dose of Tylenol which did help but has not tried again.  She denies any other, neurological deficit and states this neck pain is not debilitating.  Denies any type of trauma or injury.  She did experience this after hospital discharge but subsided and  has recently returned.  Denies any type of headache, migraine, visual loss or dizziness.  Continues on aspirin and brilinta without side effects of bleeding but does endorse easily bruising. Glucose levels have been stable as she does monitor at home. Blood pressure today 99/71. She will be following up with Dr. Estanislado Pandy next month for repeat imaging and length of DAPT will be determined at that time.  She also endorses approximately 50 pound weight loss since her stroke from diet and exercise.  No further concerns at this time.  Denies new or worsening stroke/TIA symptoms. Update 11/26/2018 : She returns for follow-up after last visit 3 months ago.  She is accompanied by a [place] language interpreter who was present throughout this visit.  Patient was recently hospitalized to [facility] on 11/19/2017 and I personally reviewed hospital records and imaging films.  She woke up that day and felt her face was drawing up with some twitching's on the right side which lasted 10 minutes.  Her symptoms cleared by the time she reached the hospital.  CT angiogram of the brain was obtained emergently which showed 50% IntraStent stenosis.  She was admitted for overnight observation and underwent MRI scan of the brain which showed no acute stroke.  She underwent diagnostic cerebral catheter angiogram on 11/20/18 by Dr Estanislado Pandy which showed only 30 to 40% IntraStent restenosis with excellent flow distally.  EEG was normal.  Patient symptoms were felt to be of unclear etiology and she was recommended to continue aspirin and Brilinta and maintain aggressive risk factor modification.  She states she is done well since then she has had no recurrent facial twitching or drawing.  She is had no other stroke or TIA symptoms.  She is tolerating aspirin and Brilinta well without bleeding or bruising.  Her blood pressure is well controlled and today it is 104/79.  She is tolerating Lipitor well without muscle aches and pains.  Her  last LDL cholesterol was 87 mg percent on 11/19/2018.  She states her sugars are well controlled and last hemoglobin A1c was 5.4 on 11/19/2018.  She states she is eating healthy and has lost 60 pounds over the last 1 year.  She has no new complaints today.  05/29/2019 update: [name] is a 63 year old female who is being seen today for follow-up as well as recent onset of seizures on 03/23/2019.  Accompanied at today's visit by interpreter.  Presented to [facility] ED with tonic-clonic seizure activity at home with additional episode in the emergency room with facial twitching, left arm being tense and continued to full tonic-clonic seizure.  She continued to have seizures until she received Ativan and then subsided.  No prior history of seizure disorder.  Initiated Keppra.  EEG normal.  She has had no recurrent seizure activity since this time.  She does report decreased appetite, fatigue and worsening depression since initiating Keppra.  She has recently been switched antidepressants with mild improvement but continues to experience above symptoms.  She does have increased anxiety regarding recent seizure activity and possibility of recurrent seizure activity.  She continues to live with her granddaughter but is able to maintain ADLs independently.  She also continues to have right-sided neck pain radiating  to shoulder which was recently evaluated by PCP who recommended follow-up if no benefit with conservative measures.  She has not followed up at this time.  Continues on aspirin and Brilinta without bleeding or bruising.  Continues on atorvastatin without myalgias.  Blood pressure stable today at 120/88.  No further concerns at this time.  Update 08/21/2019: [name] is a 63 year old female who is being seen today for seizure and stroke follow-up.  Due to potential Keppra side effects, transitioned to Vimpat.  Unfortunately, she was unable to afford Vimpat therefore Keppra restarted.  She remains on Keppra 500 mg  twice daily.  She has not had any reoccurring seizure activity.  Continues on aspirin and Brilinta without bleeding or bruising.  Continues on atorvastatin without statin myalgias.  Blood pressure today ***.  Follow-up with vascular surgery ***.    ROS:   14 system review of systems is positive for seizure and weakness and all other systems negative  PMH:  Past Medical History:  Diagnosis Date  . Depression   . Diabetes ([place])   . Hypertension   . Seizure ([place])   . Stroke [facility])     Social History:  Social History   Socioeconomic History  . Marital status: Single    Spouse name: Not on file  . Number of children: Not on file  . Years of education: Not on file  . Highest education level: Not on file  Occupational History  . Not on file  Social Needs  . Financial resource strain: Not on file  . Food insecurity    Worry: Not on file    Inability: Not on file  . Transportation needs    Medical: Not on file    Non-medical: Not on file  Tobacco Use  . Smoking status: Former Smoker    Packs/day: 0.50    Years: 30.00    Pack years: 15.00    Types: Cigarettes    Quit date: 12/04/2017    Years since quitting: 1.7  . Smokeless tobacco: Never Used  Substance and Sexual Activity  . Alcohol use: Not Currently    Comment: weekly  . Drug use: No  . Sexual activity: Not Currently    Birth control/protection: None  Lifestyle  . Physical activity    Days per week: Not on file    Minutes per session: Not on file  . Stress: Not on file  Relationships  . Social Herbalist on phone: Not on file    Gets together: Not on file    Attends religious service: Not on file    Active member of club or organization: Not on file    Attends meetings of clubs or organizations: Not on file    Relationship status: Not on file  . Intimate partner violence    Fear of current or ex partner: Not on file    Emotionally abused: Not on file    Physically abused: Not on file    Forced  sexual activity: Not on file  Other Topics Concern  . Not on file  Social History Narrative  . Not on file    Medications:   Current Outpatient Medications on File Prior to Visit  Medication Sig Dispense Refill  . acetaminophen (TYLENOL) 500 MG tablet Take 1,000 mg by mouth every 8 (eight) hours as needed for moderate pain or headache.    Marland Kitchen aspirin 81 MG chewable tablet Chew 1 tablet (81 mg total) by mouth daily.    Marland Kitchen  atorvastatin (LIPITOR) 10 MG tablet Take 1 tablet (10 mg total) by mouth daily at 6 PM. 90 tablet 3  . Blood Glucose Monitoring Suppl (TRUE METRIX METER) DEVI 1 kit by Does not apply route 4 (four) times daily. 1 Device 0  . Cholecalciferol (VITAMIN D3 PO) Take 1 tablet by mouth daily.    . citalopram (CELEXA) 10 MG tablet Take 10 mg by mouth daily.    Marland Kitchen escitalopram (LEXAPRO) 10 MG tablet TAKE 1 TABLET BY MOUTH DAILY. 30 tablet 1  . glimepiride (AMARYL) 1 MG tablet Take 1 tablet (1 mg total) by mouth daily with breakfast. 30 tablet 5  . glucose blood (TRUE METRIX BLOOD GLUCOSE TEST) test strip Use as instructed 100 each 11  . levETIRAcetam (KEPPRA) 500 MG tablet Take 500 mg by mouth 2 (two) times daily.    . meloxicam (MOBIC) 7.5 MG tablet Take 1 tablet (7.5 mg total) by mouth daily. 30 tablet 1  . methocarbamol (ROBAXIN) 500 MG tablet Take 1 tablet (500 mg total) by mouth every 8 (eight) hours as needed for muscle spasms. 90 tablet 0  . polyethylene glycol powder (GLYCOLAX/MIRALAX) powder Take 17 g by mouth daily as needed. (Patient not taking: Reported on 05/30/2019) 3350 g 1  . ticagrelor (BRILINTA) 90 MG TABS tablet Take 1 tablet (90 mg total) by mouth 2 (two) times daily. 60 tablet 3  . TRUEplus Lancets 28G MISC 28 g by Does not apply route QID. 120 each 3   No current facility-administered medications on file prior to visit.     Allergies:  No Known Allergies  Physical Exam General: Mildly obese pleasant middle-age  Hispanic Spanish-speaking lady, seated, in no  evident distress Head: head normocephalic and atraumatic.  Neck: supple with no carotid or supraclavicular bruits Cardiovascular: regular rate and rhythm, no murmurs Musculoskeletal: no deformity Skin:  no rash/petichiae Vascular:  Normal pulses all extremities There were no vitals filed for this visit. Neurologic Exam Mental Status: Awake and fully alert. Oriented to place and time. Recent and remote memory intact. Attention span, concentration and fund of knowledge appropriate. Mood and affect appropriate.  Cranial Nerves: Fundoscopic exam not done. Pupils equal, briskly reactive to light. Extraocular movements full without nystagmus. Visual fields full to confrontation. Hearing intact. Facial sensation intact. Face, tongue, palate moves normally and symmetrically.  Motor: Normal bulk and tone. Normal strength in all tested extremity muscles.  Mild diminished fine finger movements on the left.  Sensory.: intact to touch ,pinprick .position and vibratory sensation.  Coordination: Rapid alternating movements normal in all extremities. Finger-to-nose and heel-to-shin performed accurately bilaterally. Gait and Station: Arises from chair without difficulty. Stance is normal. Gait demonstrates normal stride length and balance . Able to heel, toe and tandem walk without difficulty.  Reflexes: 1+ and symmetric. Toes downgoing.      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 Vascular risk factors of intracranial stenosis, diabetes, hypertension and hyperlipidemia.  Prior hospitalization 11/18/2018 with transient facial twitchings of unclear etiology with negative EEG and negative neuroimaging for recurrent stroke or significant restenosis of intracranial stent.  Presented to ED on 03/23/2019 with tonic-clonic seizure activity with Keppra initiated.  She has not had  any recurrent seizure activity or symptoms since initiating Keppra but has been experiencing decreased appetite, fatigue and worsening depression since initiating Keppra.     PLAN: -Due to potential Keppra side  effects, it will be recommended to change therapies at this time. -Recommend initiating Vimpat 50 mg twice daily in addition to Keppra 500 mg twice daily for 2 weeks.  She will then decrease Keppra dose to 500 mg daily and increase Vimpat dose to 100 mg twice daily for a total of 2 weeks.  After that time, she will discontinue Keppra and increase Vimpat dose to 150 mg twice daily.  Interpreter assisted with providing patient with these instructions who verbalized understanding -Advised to continue to follow with PCP for ongoing depression management -Continue aspirin 81 mg and Brilinta and atorvastatin 10 mg daily for secondary stroke prevention -Continue to follow with Dr. Estanislado Pandy for ongoing monitoring and follow-up regarding intracranial stent -Continue to follow with PCP for HTN, HLD and DM management -maintain strict control of hypertension with blood pressure goal below 130/90, diabetes with hemoglobin A1c goal below 6.5% and lipids with LDL cholesterol goal below 70 mg/dL. I also advised the patient to eat a healthy diet with plenty of whole grains, cereals, fruits and vegetables, exercise regularly and maintain ideal body weight  Follow-up in 2 months or call earlier if needed  Greater than 50% of time during this 25 minute visit was spent on counseling, discussion regarding recent seizure onset likely related to prior right MCA stroke, explanation of diagnosis of intracranials tenosis, stenting, stroke, planning of further management, discussion with patient and answering all questions to patient satisfaction    Frann Rider, AGNP-BC  [facility] [address] [place], [place] [address]  Phone [phone] Fax [phone] Note: This  document was prepared with digital dictation and possible smart phrase technology. Any transcriptional errors that result from this process are unintentional.

## 2019-08-23 ENCOUNTER — Other Ambulatory Visit: Payer: Self-pay

## 2019-08-23 ENCOUNTER — Encounter: Payer: Self-pay | Admitting: Internal Medicine

## 2019-08-23 ENCOUNTER — Ambulatory Visit: Payer: Self-pay | Attending: Internal Medicine | Admitting: Internal Medicine

## 2019-08-23 VITALS — BP 112/76 | HR 70 | Temp 98.5°F | Resp 16 | Wt 220.6 lb

## 2019-08-23 DIAGNOSIS — E669 Obesity, unspecified: Secondary | ICD-10-CM

## 2019-08-23 DIAGNOSIS — Z800 Family history of malignant neoplasm of digestive organs: Secondary | ICD-10-CM

## 2019-08-23 DIAGNOSIS — G8929 Other chronic pain: Secondary | ICD-10-CM

## 2019-08-23 DIAGNOSIS — E1169 Type 2 diabetes mellitus with other specified complication: Secondary | ICD-10-CM

## 2019-08-23 DIAGNOSIS — M25511 Pain in right shoulder: Secondary | ICD-10-CM

## 2019-08-23 DIAGNOSIS — Z9189 Other specified personal risk factors, not elsewhere classified: Secondary | ICD-10-CM

## 2019-08-23 DIAGNOSIS — Z1211 Encounter for screening for malignant neoplasm of colon: Secondary | ICD-10-CM

## 2019-08-23 LAB — GLUCOSE, POCT (MANUAL RESULT ENTRY)
POC Glucose: 131 mg/dl — AB (ref 70–99)
POC Glucose: 68 mg/dl — AB (ref 70–99)

## 2019-08-23 MED ORDER — TRAMADOL HCL 50 MG PO TABS: 50.0000 mg | ORAL_TABLET | Freq: Three times a day (TID) | ORAL | 0 refills | Status: DC | PRN

## 2019-08-23 NOTE — Progress Notes (Signed)
Patient ID: Heather [name], female    DOB: [birth year]  MRN: [id number]  CC: Diabetes and Hypertension   Subjective: Heather [name] is a 63 y.o. female who presents for chronic ds management Her concerns today include:  Hx of DM, HTN, former smoker tob dep, CVART MCA territory, s/p thrombectomy followed by stenting of M2 by interventional radiology, sz disorder.   Still having pain RT shoulder.  Started 3 mths ago.  No known injury Hurts with certain movements like pulling up pants and combing hair Wakes at nights with pain Mobic does no help.    Depression:  Doing good on Lexapro  SZ: She has not had any seizures since hospitalization in July.  She is compliant with taking Keppra.     DM:  BS today was 68.  Checks BS daily in mornings.  Always b/w 100-130.   Taking Amaryl 1 mg.  Given some orange juice here in the office to bring her blood sugar back up.  Dental referral requested for routine dental care.  HM:  Due for colon cancer screen.  Brother and niece with colon CA Patient Active Problem List   Diagnosis Date Noted  . Seizures ([place]) 03/23/2019  . TIA (transient ischemic attack) 11/18/2018  . Depression 02/21/2018  . Essential hypertension 02/21/2018  . Cerebrovascular accident (CVA) due to embolism of right middle cerebral artery ([place]) 02/21/2018  . Diabetes mellitus type 2 in obese ([place]) 12/20/2017  . 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 Left hemiparesis ([place])   . Dysphagia, oropharyngeal   . 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 acetaminophen (TYLENOL) 500 MG tablet Take 1,000 mg by mouth every 8 (eight) hours as needed for moderate pain or headache.    Marland Kitchen aspirin 81 MG chewable tablet Chew 1 tablet (81 mg total) by mouth daily.    Marland Kitchen atorvastatin (LIPITOR) 10 MG tablet Take 1 tablet (10 mg total) by mouth daily at 6 PM. 90 tablet 3   . Blood Glucose Monitoring Suppl (TRUE METRIX METER) DEVI 1 kit by Does not apply route 4 (four) times daily. 1 Device 0  . Cholecalciferol (VITAMIN D3 PO) Take 1 tablet by mouth daily.    . citalopram (CELEXA) 10 MG tablet Take 10 mg by mouth daily.    Marland Kitchen escitalopram (LEXAPRO) 10 MG tablet TAKE 1 TABLET BY MOUTH DAILY. 30 tablet 1  . glimepiride (AMARYL) 1 MG tablet Take 1 tablet (1 mg total) by mouth daily with breakfast. 30 tablet 5  . glucose blood (TRUE METRIX BLOOD GLUCOSE TEST) test strip Use as instructed 100 each 11  . levETIRAcetam (KEPPRA) 500 MG tablet Take 500 mg by mouth 2 (two) times daily.    . meloxicam (MOBIC) 7.5 MG tablet Take 1 tablet (7.5 mg total) by mouth daily. 30 tablet 1  . methocarbamol (ROBAXIN) 500 MG tablet Take 1 tablet (500 mg total) by mouth every 8 (eight) hours as needed for muscle spasms. 90 tablet 0  . polyethylene glycol powder (GLYCOLAX/MIRALAX) powder Take 17 g by mouth daily as needed. (Patient not taking: Reported on 05/30/2019) 3350 g 1  . ticagrelor (BRILINTA) 90 MG TABS tablet Take 1 tablet (90 mg total) by mouth 2 (two) times daily. 60 tablet 3  . TRUEplus Lancets 28G MISC 28 g by Does not apply route QID. 120 each 3   No current  facility-administered medications on file prior to visit.     No Known Allergies  Social History   Socioeconomic History  . Marital status: Single    Spouse name: Not on file  . Number of children: Not on file  . Years of education: Not on file  . Highest education level: Not on file  Occupational History  . Not on file  Social Needs  . Financial resource strain: Not on file  . Food insecurity    Worry: Not on file    Inability: Not on file  . Transportation needs    Medical: Not on file    Non-medical: Not on file  Tobacco Use  . Smoking status: Former Smoker    Packs/day: 0.50    Years: 30.00    Pack years: 15.00    Types: Cigarettes    Quit date: 12/04/2017    Years since quitting: 1.7  . Smokeless  tobacco: Never Used  Substance and Sexual Activity  . Alcohol use: Not Currently    Comment: weekly  . Drug use: No  . Sexual activity: Not Currently    Birth control/protection: None  Lifestyle  . Physical activity    Days per week: Not on file    Minutes per session: Not on file  . Stress: Not on file  Relationships  . Social Herbalist on phone: Not on file    Gets together: Not on file    Attends religious service: Not on file    Active member of club or organization: Not on file    Attends meetings of clubs or organizations: Not on file    Relationship status: Not on file  . Intimate partner violence    Fear of current or ex partner: Not on file    Emotionally abused: Not on file    Physically abused: Not on file    Forced sexual activity: Not on file  Other Topics Concern  . Not on file  Social History Narrative  . Not on file    Family History  Problem Relation Age of Onset  . Diabetes Mother   . Pulmonary disease Mother   . Heart disease Father   . Stroke Sister     Past Surgical History:  Procedure Laterality Date  . BREAST EXCISIONAL BIOPSY    . BREAST SURGERY     right breast  . CHOLECYSTECTOMY    . IR ANGIO INTRA EXTRACRAN SEL COM CAROTID INNOMINATE BILAT MOD SED  03/06/2018  . IR ANGIO INTRA EXTRACRAN SEL COM CAROTID INNOMINATE BILAT MOD SED  11/20/2018  . IR ANGIO VERTEBRAL SEL VERTEBRAL BILAT MOD SED  03/06/2018  . IR ANGIO VERTEBRAL SEL VERTEBRAL BILAT MOD SED  11/20/2018  . IR CT HEAD LTD  12/18/2017  . IR INTRA CRAN STENT  12/18/2017  . IR PERCUTANEOUS ART THROMBECTOMY/INFUSION INTRACRANIAL INC DIAG ANGIO  12/18/2017  . IR RADIOLOGIST EVAL & MGMT  01/31/2018  . RADIOLOGY WITH ANESTHESIA N/A 12/18/2017   Procedure: RADIOLOGY WITH ANESTHESIA;  Surgeon: Luanne Bras, MD;  Location: [facility];  Service: Radiology;  Laterality: N/A;    ROS: Review of Systems Negative except as stated above  PHYSICAL EXAM: BP 112/76   Pulse 70   Temp 98.5 F  (36.9 C) (Oral)   Resp 16   Wt 220 lb 9.6 oz (100.1 kg)   SpO2 98%   BMI 33.54 kg/m   Physical Exam  General appearance - alert, well appearing, and in no  distress Mental status - normal mood, behavior, speech, dress, motor activity, and thought processes Neck - supple, no significant adenopathy.  Has a buffalo hump at the back of the neck Chest - clear to auscultation, no wheezes, rales or rhonchi, symmetric air entry Heart - normal rate, regular rhythm, normal S1, S2, no murmurs, rubs, clicks or gallops Musculoskeletal -right shoulder: Mild to moderate tenderness on palpation over the anterior and posterior joint.  Moderate discomfort with passive range of motion in all direction.  Drop arm test positive. Extremities - peripheral pulses normal, no pedal edema, no clubbing or cyanosis   CMP Latest Ref Rng & Units 03/23/2019 03/23/2019 11/20/2018  Glucose 70 - 99 mg/dL 135(H) 141(H) 95  BUN 8 - 23 mg/dL _0 Creatinine 0.44 - 1.00 mg/dL 0.40(L) 0.62 0.47  Sodium 135 - 145 mmol/L 141 141 140  Potassium 3.5 - 5.1 mmol/L 4.8 4.4 3.7  Chloride 98 - 111 mmol/L 108 109 111  CO2 22 - 32 mmol/L - 21(L) 23  Calcium 8.9 - 10.3 mg/dL - 9.1 9.0  Total Protein 6.5 - 8.1 g/dL - 6.9 -  Total Bilirubin 0.3 - 1.2 mg/dL - 0.7 -  Alkaline Phos 38 - 126 U/L - 71 -  AST 15 - 41 U/L - 27 -  ALT 0 - 44 U/L - 27 -   Lipid Panel     Component Value Date/Time   CHOL 96 03/24/2019 0611   TRIG 77 03/24/2019 0611   HDL 39 (L) 03/24/2019 0611   CHOLHDL 2.5 03/24/2019 0611   VLDL 15 03/24/2019 0611   LDLCALC 42 03/24/2019 0611    CBC    Component Value Date/Time   WBC 9.5 03/23/2019 1250   RBC 4.72 03/23/2019 1250   HGB 15.3 (H) 03/23/2019 1308   HGB 14.1 10/12/2018 1710   HCT 45.0 03/23/2019 1308   HCT 43.0 10/12/2018 1710   PLT 268 03/23/2019 1250   PLT 305 10/12/2018 1710   MCV 92.8 03/23/2019 1250   MCV 91 10/12/2018 1710   MCH 30.1 03/23/2019 1250   MCHC 32.4 03/23/2019 1250   RDW  13.3 03/23/2019 1250   RDW 13.2 10/12/2018 1710   LYMPHSABS 2.1 03/23/2019 1250   MONOABS 0.5 03/23/2019 1250   EOSABS 0.1 03/23/2019 1250   BASOSABS 0.0 03/23/2019 1250   Results for orders placed or performed in visit on 08/23/19  Glucose (CBG)  Result Value Ref Range   POC Glucose 68 (A) 70 - 99 mg/dl  Glucose (CBG)  Result Value Ref Range   POC Glucose 131 (A) 70 - 99 mg/dl     ASSESSMENT AND PLAN: 1. Diabetes mellitus type 2 in obese ([place]) Home blood sugar readings are good.  However she presented today with low blood sugar that came up to 131 after being given some orange juice.  Advised to get in meals on time.  Continue to monitor blood sugars.  If she has any further low blood sugar episodes we may need to discontinue Amaryl.  Check A1c today - Microalbumin/Creatinine Ratio, Urine - Glucose (CBG) - Glucose (CBG) - Hemoglobin A1c  2. Chronic right shoulder pain Exam suggest rotator cuff issue.  We will get an MRI.  Limited prescription given for tramadol to use as needed for pain - MR Shoulder Right Wo Contrast; Future - traMADol (ULTRAM) 50 MG tablet; Take 1 tablet (50 mg total) by mouth every 8 (eight) hours as needed.  Dispense: 60 tablet; Refill: 0  3. Screening for colon cancer Family history of colon cancer.  She is due for colon cancer screening.  Referral to gastroenterology - Ambulatory referral to Gastroenterology  4. Need for dental care - Ambulatory referral to Dentistry  5. Family history of colon cancer See #3 above    Patient was given the opportunity to ask questions.  Patient verbalized understanding of the plan and was able to repeat key elements of the plan.  [employer] interpreter used during this encounter. #[id number]   Orders Placed This Encounter  Procedures  . Microalbumin/Creatinine Ratio, Urine  . Glucose (CBG)  . Glucose (CBG)     Requested Prescriptions    No prescriptions requested or ordered in this encounter    No follow-ups  on file.  Karle Plumber, MD, FACP

## 2019-08-24 LAB — HEMOGLOBIN A1C
Est. average glucose Bld gHb Est-mCnc: 108 mg/dL
Hgb A1c MFr Bld: 5.4 % (ref 4.8–5.6)

## 2019-08-24 LAB — MICROALBUMIN / CREATININE URINE RATIO
Creatinine, Urine: 21.6 mg/dL
Microalb/Creat Ratio: <14 mg/g creat (ref 0–29)
Microalbumin, Urine: <3 ug/mL

## 2019-08-27 ENCOUNTER — Other Ambulatory Visit: Payer: Self-pay | Admitting: Internal Medicine

## 2019-08-27 ENCOUNTER — Telehealth (HOSPITAL_COMMUNITY): Payer: Self-pay

## 2019-08-27 DIAGNOSIS — F329 Major depressive disorder, single episode, unspecified: Secondary | ICD-10-CM

## 2019-08-27 DIAGNOSIS — F32A Depression, unspecified: Secondary | ICD-10-CM

## 2019-08-27 NOTE — Telephone Encounter (Signed)
Called to schedule f/u mra, no answer, left vm. AW 

## 2019-08-28 ENCOUNTER — Telehealth: Payer: Self-pay

## 2019-08-28 NOTE — Telephone Encounter (Signed)
[employer] interpre[employer]  Christean Grief Id#  [id number] contacted pt to go over lab results pt is aware and doesn't have any questions or concerns

## 2019-08-30 ENCOUNTER — Other Ambulatory Visit: Payer: Self-pay

## 2019-08-30 ENCOUNTER — Emergency Department (HOSPITAL_COMMUNITY)
Admission: EM | Admit: 2019-08-30 | Discharge: 2019-08-30 | Disposition: A | Discharge status: Home / Self Care | Payer: Self-pay | Attending: Emergency Medicine | Admitting: Emergency Medicine

## 2019-08-30 DIAGNOSIS — Z8673 Personal history of transient ischemic attack (TIA), and cerebral infarction without residual deficits: Secondary | ICD-10-CM | POA: Insufficient documentation

## 2019-08-30 DIAGNOSIS — Z760 Encounter for issue of repeat prescription: Secondary | ICD-10-CM | POA: Insufficient documentation

## 2019-08-30 DIAGNOSIS — Z79899 Other long term (current) drug therapy: Secondary | ICD-10-CM | POA: Insufficient documentation

## 2019-08-30 DIAGNOSIS — Z7984 Long term (current) use of oral hypoglycemic drugs: Secondary | ICD-10-CM | POA: Insufficient documentation

## 2019-08-30 DIAGNOSIS — Z7982 Long term (current) use of aspirin: Secondary | ICD-10-CM | POA: Insufficient documentation

## 2019-08-30 DIAGNOSIS — Z87891 Personal history of nicotine dependence: Secondary | ICD-10-CM | POA: Insufficient documentation

## 2019-08-30 DIAGNOSIS — I10 Essential (primary) hypertension: Secondary | ICD-10-CM | POA: Insufficient documentation

## 2019-08-30 DIAGNOSIS — E119 Type 2 diabetes mellitus without complications: Secondary | ICD-10-CM | POA: Insufficient documentation

## 2019-08-30 MED ORDER — TICAGRELOR 90 MG PO TABS: 90.0000 mg | ORAL_TABLET | Freq: Two times a day (BID) | ORAL | 0 refills | Status: DC

## 2019-08-30 NOTE — ED Triage Notes (Addendum)
Pt states she is out of her Brilinta and her PCP's office is closed.

## 2019-08-30 NOTE — ED Notes (Signed)
Patient verbalizes understanding of discharge instructions. Opportunity for questioning and answers were provided. Armband removed by staff, pt discharged from ED.  

## 2019-08-30 NOTE — ED Provider Notes (Signed)
[facility] EMERGENCY DEPARTMENT Provider Note   CSN: [id number] Arrival date & time: 08/30/19  1540     History   Chief Complaint No chief complaint on file.   HPI Heather [name] is a 63 y.o. female.     Patient is a 63 year old Spanish-speaking female with a history of hypertension, seizure, stroke and diabetes who is presenting today with request for medication refill.  Patient states that she ran out of her Brilinta last night and she was unable to pick it up at the pharmacy yesterday because it was Thanksgiving.  When she went today they told her that it would not be ready until Monday.  She is just worried and knows she needs to take the medication regularly and is requesting a prescription for the next 3 days until she can get her medication filled on Monday.  She has no complaints at this time  The history is provided by the patient. The history is limited by a language barrier. A language interpreter was used.    Past Medical History:  Diagnosis Date  . Depression   . Diabetes ([place])   . Hypertension   . Seizure ([place])   . Stroke [facility])     Patient Active Problem List   Diagnosis Date Noted  . Seizures ([place]) 03/23/2019  . TIA (transient ischemic attack) 11/18/2018  . Depression 02/21/2018  . Essential hypertension 02/21/2018  . Cerebrovascular accident (CVA) due to embolism of right middle cerebral artery ([place]) 02/21/2018  . Diabetes mellitus type 2 in obese ([place]) 12/20/2017  . 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 Left hemiparesis ([place])   . Dysphagia, oropharyngeal   . Middle cerebral artery embolism, 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    Past Surgical History:  Procedure Laterality Date  . BREAST EXCISIONAL BIOPSY    . BREAST SURGERY     right breast  . CHOLECYSTECTOMY    . IR ANGIO INTRA EXTRACRAN SEL COM CAROTID INNOMINATE BILAT MOD SED  03/06/2018  . IR ANGIO INTRA EXTRACRAN SEL COM CAROTID INNOMINATE BILAT MOD SED  11/20/2018  . IR  ANGIO VERTEBRAL SEL VERTEBRAL BILAT MOD SED  03/06/2018  . IR ANGIO VERTEBRAL SEL VERTEBRAL BILAT MOD SED  11/20/2018  . IR CT HEAD LTD  12/18/2017  . IR INTRA CRAN STENT  12/18/2017  . IR PERCUTANEOUS ART THROMBECTOMY/INFUSION INTRACRANIAL INC DIAG ANGIO  12/18/2017  . IR RADIOLOGIST EVAL & MGMT  01/31/2018  . RADIOLOGY WITH ANESTHESIA N/A 12/18/2017   Procedure: RADIOLOGY WITH ANESTHESIA;  Surgeon: Luanne Bras, MD;  Location: [facility];  Service: Radiology;  Laterality: N/A;     OB History    Gravida  1   Para      Term      Preterm      AB      Living  1     SAB      TAB      Ectopic      Multiple      Live Births  1            Home Medications    Prior to Admission medications   Medication Sig Start Date End Date Taking? Authorizing Provider  acetaminophen (TYLENOL) 500 MG tablet Take 1,000 mg by mouth every 8 (eight) hours as needed for moderate pain or headache.    [provider]  aspirin 81 MG chewable tablet Chew 1 tablet (81 mg total) by mouth daily. 12/27/17   Lauraine Rinne  J, PA-C  atorvastatin (LIPITOR) 10 MG tablet Take 1 tablet (10 mg total) by mouth daily at 6 PM. 03/25/19   Black, Lezlie Octave, NP  Blood Glucose Monitoring Suppl (TRUE METRIX METER) DEVI 1 kit by Does not apply route 4 (four) times daily. 03/27/19   Argentina Donovan, PA-C  Cholecalciferol (VITAMIN D3 PO) Take 1 tablet by mouth daily.    [provider]  citalopram (CELEXA) 10 MG tablet Take 10 mg by mouth daily.    [provider]  escitalopram (LEXAPRO) 10 MG tablet TAKE 1 TABLET BY MOUTH DAILY. 08/28/19   Ladell Pier, MD  glimepiride (AMARYL) 1 MG tablet Take 1 tablet (1 mg total) by mouth daily with breakfast. 05/17/19   Ladell Pier, MD  glucose blood (TRUE METRIX BLOOD GLUCOSE TEST) test strip Use as instructed 03/27/19   Argentina Donovan, PA-C  levETIRAcetam (KEPPRA) 500 MG tablet Take 500 mg by mouth 2 (two) times daily.    [provider]  methocarbamol (ROBAXIN) 500 MG tablet Take 1 tablet (500 mg total) by mouth every 8 (eight) hours as needed for muscle spasms. 06/19/19   Argentina Donovan, PA-C  ticagrelor (BRILINTA) 90 MG TABS tablet Take 1 tablet (90 mg total) by mouth 2 (two) times daily. 08/30/19   Blanchie Dessert, MD  traMADol (ULTRAM) 50 MG tablet Take 1 tablet (50 mg total) by mouth every 8 (eight) hours as needed. 08/23/19   Ladell Pier, MD  TRUEplus Lancets 28G MISC 28 g by Does not apply route QID. 03/27/19   Argentina Donovan, PA-C    Family History Family History  Problem Relation Age of Onset  . Diabetes Mother   . Pulmonary disease Mother   . Heart disease Father   . Stroke Sister     Social History Social History   Tobacco Use  . Smoking status: Former Smoker    Packs/day: 0.50    Years: 30.00    Pack years: 15.00    Types: Cigarettes    Quit date: 12/04/2017    Years since quitting: 1.7  . Smokeless tobacco: Never Used  Substance Use Topics  . Alcohol use: Not Currently    Comment: weekly  . Drug use: No     Allergies   Patient has no known allergies.   Review of Systems Review of Systems  All other systems reviewed and are negative.    Physical Exam Updated Vital Signs BP 122/88 (BP Location: Right Arm)   Pulse 75   Temp 99.5 F (37.5 C) (Oral)   Resp 15   SpO2 97%   Physical Exam Vitals signs and nursing note reviewed.  Constitutional:      Appearance: Normal appearance.  HENT:     Head: Normocephalic.  Cardiovascular:     Rate and Rhythm: Normal rate.  Pulmonary:     Effort: Pulmonary effort is normal.  Skin:    General: Skin is warm.  Neurological:     Mental Status: She is alert. Mental status is at baseline.  Psychiatric:        Mood and Affect: Mood normal.        Behavior: Behavior normal.        Thought Content: Thought content normal.      ED Treatments / Results  Labs (all labs ordered are listed, but only abnormal results are  displayed) Labs Reviewed - No data to display  EKG None  Radiology No results found.  Procedures Procedures (including critical care time)  Medications Ordered in ED Medications - No data to display   Initial Impression / Assessment and Plan / ED Course  I have reviewed the triage vital signs and the nursing notes.  Pertinent labs & imaging results that were available during my care of the patient were reviewed by me and considered in my medical decision making (see chart for details).       Patient here for medication refill because she is unable to get the prescription filled until Monday because the pharmacy is closed.  She has no complaints at this time.  Patient was given a 3-day prescription for Brilinta  Final Clinical Impressions(s) / ED Diagnoses   Final diagnoses:  Encounter for medication refill    ED Discharge Orders         Ordered    ticagrelor (BRILINTA) 90 MG TABS tablet  2 times daily     08/30/19 1625           Blanchie Dessert, MD 08/30/19 1628

## 2019-08-31 ENCOUNTER — Ambulatory Visit (HOSPITAL_COMMUNITY): Admission: RE | Admit: 2019-08-31 | Payer: Self-pay | Source: Ambulatory Visit

## 2019-09-16 ENCOUNTER — Telehealth: Payer: Self-pay | Admitting: Internal Medicine

## 2019-09-16 MED ORDER — AMOXICILLIN 500 MG PO CAPS: 500.0000 mg | ORAL_CAPSULE | Freq: Three times a day (TID) | ORAL | 0 refills | Status: DC

## 2019-09-16 NOTE — Telephone Encounter (Signed)
Will forward to pcp

## 2019-09-16 NOTE — Telephone Encounter (Signed)
Patient called requesting an Rx for an antibiotic. Patient states that one of her molars hurt and also her ear started hurting. Patient thinks she has an infection. Please f/u

## 2019-09-18 ENCOUNTER — Telehealth: Payer: Self-pay | Admitting: Internal Medicine

## 2019-09-18 DIAGNOSIS — G8929 Other chronic pain: Secondary | ICD-10-CM

## 2019-09-18 NOTE — Telephone Encounter (Signed)
1) Medication(s) Requested (by name): traMADol (ULTRAM) 50 MG tablet    2) Pharmacy of Choice: [facility]   3) Special Requests:Please, call her to to pick up . Thanks     Approved medications will be sent to the pharmacy, we will reach out if there is an issue.  Requests made after 3pm may not be addressed until the following business day!  If a patient is unsure of the name of the medication(s) please note and ask patient to call back when they are able to provide all info, do not send to responsible party until all information is available!

## 2019-09-18 NOTE — Telephone Encounter (Signed)
Will route to PCP 

## 2019-09-19 MED ORDER — TRAMADOL HCL 50 MG PO TABS: 50.0000 mg | ORAL_TABLET | Freq: Three times a day (TID) | ORAL | 0 refills | Status: DC | PRN

## 2019-09-23 NOTE — Telephone Encounter (Signed)
Heather [name] could you please contact and give new appointment information for MRI    Pt MRI is reschedule for Januray 8, 2021 at 5pm at [facility]. Pt will need to arrive at 430 pm.   If pt needs to reschedule please give scheduling number [phone]

## 2019-10-01 ENCOUNTER — Encounter: Payer: Self-pay | Admitting: Internal Medicine

## 2019-10-01 ENCOUNTER — Telehealth: Payer: Self-pay | Admitting: Internal Medicine

## 2019-10-01 NOTE — Telephone Encounter (Signed)
Pt called complaining that she feel dizy and have small headache an since the had an stroke last year she is concerning, please call her back urgent

## 2019-10-01 NOTE — Telephone Encounter (Signed)
Will forward to pcp

## 2019-10-01 NOTE — Telephone Encounter (Signed)
LVM with appointment details, and to give [place] a call back if she had any questions/concerns.

## 2019-10-03 NOTE — Telephone Encounter (Signed)
PC placed to pt using [employer].  Interpreter was Colletta [place] [phone]. Pt reports she was having slight HA and dizziness the day she called but resolved and has not reoccurred. She is still having a lot of pain in the right shoulder for which she was evaluated on last visit.  She has an MRI coming up on 10/11/2019.  She plans to keep that appointment.  She feels the tramadol is not helping.  I advised that she increase the tramadol to 2 tablets in the morning and 2 tablets in the evening.  Further management will be based on results of the MRI.  Patient expressed understanding.

## 2019-10-09 ENCOUNTER — Ambulatory Visit
Admission: RE | Admit: 2019-10-09 | Discharge: 2019-10-09 | Disposition: A | Discharge status: Home / Self Care | Payer: No Typology Code available for payment source | Source: Ambulatory Visit | Attending: Internal Medicine | Admitting: Internal Medicine

## 2019-10-09 ENCOUNTER — Other Ambulatory Visit: Payer: Self-pay

## 2019-10-09 DIAGNOSIS — Z1231 Encounter for screening mammogram for malignant neoplasm of breast: Secondary | ICD-10-CM

## 2019-10-10 ENCOUNTER — Telehealth (HOSPITAL_COMMUNITY): Payer: Self-pay

## 2019-10-10 NOTE — Telephone Encounter (Signed)
Called to schedule f/u, no answer, left vm. AW 

## 2019-10-11 ENCOUNTER — Telehealth: Payer: Self-pay

## 2019-10-11 ENCOUNTER — Ambulatory Visit (HOSPITAL_COMMUNITY): Admission: RE | Admit: 2019-10-11 | Payer: Self-pay | Source: Ambulatory Visit

## 2019-10-11 NOTE — Telephone Encounter (Signed)
[employer] interpre[employer] Judeth Cornfield  Id#  [id number] contacted pt to go over MM results pt didn't answer lvm informing pt of results and if she has any questions or concerns to give me a call

## 2019-10-14 ENCOUNTER — Ambulatory Visit: Payer: Self-pay | Attending: Internal Medicine | Admitting: Internal Medicine

## 2019-10-14 ENCOUNTER — Other Ambulatory Visit: Payer: Self-pay

## 2019-10-14 ENCOUNTER — Other Ambulatory Visit (HOSPITAL_COMMUNITY): Payer: Self-pay | Admitting: Interventional Radiology

## 2019-10-14 DIAGNOSIS — I639 Cerebral infarction, unspecified: Secondary | ICD-10-CM

## 2019-10-14 DIAGNOSIS — I771 Stricture of artery: Secondary | ICD-10-CM

## 2019-10-16 ENCOUNTER — Ambulatory Visit: Payer: Self-pay | Admitting: Adult Health

## 2019-10-22 ENCOUNTER — Ambulatory Visit (HOSPITAL_COMMUNITY)
Admission: RE | Admit: 2019-10-22 | Discharge: 2019-10-22 | Disposition: A | Discharge status: Home / Self Care | Payer: Self-pay | Source: Ambulatory Visit | Attending: Interventional Radiology | Admitting: Interventional Radiology

## 2019-10-22 ENCOUNTER — Other Ambulatory Visit: Payer: Self-pay

## 2019-10-22 DIAGNOSIS — I639 Cerebral infarction, unspecified: Secondary | ICD-10-CM | POA: Insufficient documentation

## 2019-10-22 DIAGNOSIS — I771 Stricture of artery: Secondary | ICD-10-CM | POA: Insufficient documentation

## 2019-10-24 ENCOUNTER — Other Ambulatory Visit (HOSPITAL_COMMUNITY): Payer: Self-pay | Admitting: Interventional Radiology

## 2019-10-24 DIAGNOSIS — R42 Dizziness and giddiness: Secondary | ICD-10-CM

## 2019-10-24 DIAGNOSIS — I771 Stricture of artery: Secondary | ICD-10-CM

## 2019-10-24 DIAGNOSIS — R519 Headache, unspecified: Secondary | ICD-10-CM

## 2019-10-30 ENCOUNTER — Other Ambulatory Visit: Payer: Self-pay | Admitting: Radiology

## 2019-10-31 ENCOUNTER — Other Ambulatory Visit: Payer: Self-pay | Admitting: Internal Medicine

## 2019-10-31 ENCOUNTER — Other Ambulatory Visit (HOSPITAL_COMMUNITY): Payer: Self-pay | Admitting: Interventional Radiology

## 2019-10-31 ENCOUNTER — Encounter (HOSPITAL_COMMUNITY): Payer: Self-pay

## 2019-10-31 ENCOUNTER — Other Ambulatory Visit: Payer: Self-pay

## 2019-10-31 ENCOUNTER — Ambulatory Visit (HOSPITAL_COMMUNITY)
Admission: RE | Admit: 2019-10-31 | Discharge: 2019-10-31 | Disposition: A | Discharge status: Home / Self Care | Payer: No Typology Code available for payment source | Source: Ambulatory Visit | Attending: Interventional Radiology | Admitting: Interventional Radiology

## 2019-10-31 DIAGNOSIS — Z8673 Personal history of transient ischemic attack (TIA), and cerebral infarction without residual deficits: Secondary | ICD-10-CM

## 2019-10-31 DIAGNOSIS — I771 Stricture of artery: Secondary | ICD-10-CM

## 2019-10-31 DIAGNOSIS — R42 Dizziness and giddiness: Secondary | ICD-10-CM

## 2019-10-31 DIAGNOSIS — F329 Major depressive disorder, single episode, unspecified: Secondary | ICD-10-CM | POA: Insufficient documentation

## 2019-10-31 DIAGNOSIS — I10 Essential (primary) hypertension: Secondary | ICD-10-CM | POA: Insufficient documentation

## 2019-10-31 DIAGNOSIS — Z79899 Other long term (current) drug therapy: Secondary | ICD-10-CM | POA: Insufficient documentation

## 2019-10-31 DIAGNOSIS — R519 Headache, unspecified: Secondary | ICD-10-CM

## 2019-10-31 DIAGNOSIS — Z87891 Personal history of nicotine dependence: Secondary | ICD-10-CM | POA: Insufficient documentation

## 2019-10-31 DIAGNOSIS — R569 Unspecified convulsions: Secondary | ICD-10-CM | POA: Insufficient documentation

## 2019-10-31 DIAGNOSIS — Z7982 Long term (current) use of aspirin: Secondary | ICD-10-CM | POA: Insufficient documentation

## 2019-10-31 DIAGNOSIS — Z7984 Long term (current) use of oral hypoglycemic drugs: Secondary | ICD-10-CM | POA: Insufficient documentation

## 2019-10-31 DIAGNOSIS — E119 Type 2 diabetes mellitus without complications: Secondary | ICD-10-CM | POA: Insufficient documentation

## 2019-10-31 DIAGNOSIS — G8929 Other chronic pain: Secondary | ICD-10-CM

## 2019-10-31 DIAGNOSIS — I6601 Occlusion and stenosis of right middle cerebral artery: Secondary | ICD-10-CM | POA: Insufficient documentation

## 2019-10-31 DIAGNOSIS — Z7902 Long term (current) use of antithrombotics/antiplatelets: Secondary | ICD-10-CM | POA: Insufficient documentation

## 2019-10-31 HISTORY — PX: IR ANGIO VERTEBRAL SEL VERTEBRAL BILAT MOD SED: IMG5369

## 2019-10-31 HISTORY — PX: IR US GUIDE VASC ACCESS RIGHT: IMG2390

## 2019-10-31 HISTORY — PX: IR ANGIO INTRA EXTRACRAN SEL COM CAROTID INNOMINATE BILAT MOD SED: IMG5360

## 2019-10-31 LAB — POCT I-STAT, CHEM 8
BUN: 16 mg/dL (ref 8–23)
Calcium, Ion: 1.21 mmol/L (ref 1.15–1.40)
Chloride: 108 mmol/L (ref 98–111)
Creatinine, Ser: 0.3 mg/dL — ABNORMAL LOW (ref 0.44–1.00)
Glucose, Bld: 100 mg/dL — ABNORMAL HIGH (ref 70–99)
HCT: 38 % (ref 36.0–46.0)
Hemoglobin: 12.9 g/dL (ref 12.0–15.0)
Potassium: 4.5 mmol/L (ref 3.5–5.1)
Sodium: 143 mmol/L (ref 135–145)
TCO2: 26 mmol/L (ref 22–32)

## 2019-10-31 LAB — GLUCOSE, CAPILLARY: Glucose-Capillary: 113 mg/dL — ABNORMAL HIGH (ref 70–99)

## 2019-10-31 LAB — CBC
HCT: 41.9 % (ref 36.0–46.0)
Hemoglobin: 14.1 g/dL (ref 12.0–15.0)
MCH: 30.5 pg (ref 26.0–34.0)
MCHC: 33.7 g/dL (ref 30.0–36.0)
MCV: 90.7 fL (ref 80.0–100.0)
Platelets: 310 10*3/uL (ref 150–400)
RBC: 4.62 MIL/uL (ref 3.87–5.11)
RDW: 12.8 % (ref 11.5–15.5)
WBC: 5.5 10*3/uL (ref 4.0–10.5)
nRBC: 0 % (ref 0.0–0.2)

## 2019-10-31 LAB — PROTIME-INR
INR: 1.2 (ref 0.8–1.2)
Prothrombin Time: 15.3 seconds — ABNORMAL HIGH (ref 11.4–15.2)

## 2019-10-31 MED ORDER — FENTANYL CITRATE (PF) 100 MCG/2ML IJ SOLN
INTRAMUSCULAR | Status: AC
  Filled 2019-10-31: qty 2

## 2019-10-31 MED ORDER — HEPARIN SODIUM (PORCINE) 1000 UNIT/ML IJ SOLN
INTRAMUSCULAR | Status: AC
  Filled 2019-10-31: qty 1

## 2019-10-31 MED ORDER — SODIUM CHLORIDE 0.9 % IV SOLN: Freq: Once | INTRAVENOUS | Status: AC

## 2019-10-31 MED ORDER — NITROGLYCERIN 1 MG/10 ML FOR IR/CATH LAB
INTRA_ARTERIAL | Status: AC
  Filled 2019-10-31: qty 10

## 2019-10-31 MED ORDER — VERAPAMIL HCL 2.5 MG/ML IV SOLN
INTRAVENOUS | Status: AC | PRN
  Administered 2019-10-31: 2.5 mg via INTRA_ARTERIAL

## 2019-10-31 MED ORDER — MIDAZOLAM HCL 2 MG/2ML IJ SOLN
INTRAMUSCULAR | Status: AC | PRN
  Administered 2019-10-31: 1 mg via INTRAVENOUS

## 2019-10-31 MED ORDER — NITROGLYCERIN 1 MG/10 ML FOR IR/CATH LAB
INTRA_ARTERIAL | Status: AC | PRN
  Administered 2019-10-31 (×2): 200 ug via INTRA_ARTERIAL

## 2019-10-31 MED ORDER — FENTANYL CITRATE (PF) 100 MCG/2ML IJ SOLN
INTRAMUSCULAR | Status: AC | PRN
  Administered 2019-10-31: 25 ug via INTRAVENOUS

## 2019-10-31 MED ORDER — VERAPAMIL HCL 2.5 MG/ML IV SOLN
INTRAVENOUS | Status: AC
  Filled 2019-10-31: qty 2

## 2019-10-31 MED ORDER — IOHEXOL 300 MG/ML  SOLN
150.0000 mL | Freq: Once | INTRAMUSCULAR | Status: AC | PRN
  Administered 2019-10-31: 70 mL via INTRA_ARTERIAL

## 2019-10-31 MED ORDER — HEPARIN SODIUM (PORCINE) 1000 UNIT/ML IJ SOLN
INTRAMUSCULAR | Status: AC | PRN
  Administered 2019-10-31: 2000 [IU] via INTRA_ARTERIAL

## 2019-10-31 MED ORDER — MIDAZOLAM HCL 2 MG/2ML IJ SOLN
INTRAMUSCULAR | Status: AC
  Filled 2019-10-31: qty 2

## 2019-10-31 MED ORDER — LIDOCAINE HCL 1 % IJ SOLN
INTRAMUSCULAR | Status: AC
  Filled 2019-10-31: qty 20

## 2019-10-31 MED ORDER — LIDOCAINE HCL 1 % IJ SOLN
INTRAMUSCULAR | Status: AC | PRN
  Administered 2019-10-31: 1 mL

## 2019-10-31 MED ORDER — SODIUM CHLORIDE 0.9 % IV SOLN: INTRAVENOUS | Status: AC

## 2019-10-31 NOTE — Progress Notes (Signed)
Discharge instructions reviewed with pt  (Interpreter)and her daughter (via telephone) both voice understanding.

## 2019-10-31 NOTE — Progress Notes (Signed)
Pt daughter called and updated on d/c time. Pt can be d/c at 1620

## 2019-10-31 NOTE — H&P (Addendum)
Chief Complaint: Patient was seen in consultation today for cerebral arteriogram at the request of  Dr Chauncey Cruel Aroor   Supervising Physician: Luanne Bras  Patient Status: [facility] - Out-pt  History of Present Illness: Heather [name] is a 64 y.o. female   Hx CVA Known to NIR R MCA revascularization 12/2017 ENDOVASCULAR REVASCULARIZATION OF OCCLUDED [place]INANT INFERIOR DIVISION OF THE RIGHT MIDDLE CEREBRAL ARTERY WITH 2 PASSES WITH THE SOLITAIRE FR 4 MM X 40 MM RETRIEVAL DEVICE ACHIEVING A TICI 3 REPERFUSION FOLLOWED BY REOCCLUSION AND RESCUE STENTING.  follwos with Dr Estanislado Pandy Recent MRA:  IMPRESSION: 1. No emergent large vessel occlusion or high-grade stenosis. 2. Patent right MCA stent. 3. Old right MCA territory infarct.  pts daughter relays to [place] that pt is still with left sided headache and dizziness several times a week Pt speaking through interpreter confirms left sided headache 3-4 x/week Denies N/V Denies speech or vision changes Denies problems with gait or extremities  She confirms ASA/Brilinta daily  Scheduled now for cerebral arteriogram   Past Medical History:  Diagnosis Date  . Depression   . Diabetes ([place])   . Hypertension   . Seizure ([place])   . Stroke [facility])     Past Surgical History:  Procedure Laterality Date  . BREAST EXCISIONAL BIOPSY    . BREAST SURGERY     right breast  . CHOLECYSTECTOMY    . IR ANGIO INTRA EXTRACRAN SEL COM CAROTID INNOMINATE BILAT MOD SED  03/06/2018  . IR ANGIO INTRA EXTRACRAN SEL COM CAROTID INNOMINATE BILAT MOD SED  11/20/2018  . IR ANGIO VERTEBRAL SEL VERTEBRAL BILAT MOD SED  03/06/2018  . IR ANGIO VERTEBRAL SEL VERTEBRAL BILAT MOD SED  11/20/2018  . IR CT HEAD LTD  12/18/2017  . IR INTRA CRAN STENT  12/18/2017  . IR PERCUTANEOUS ART THROMBECTOMY/INFUSION INTRACRANIAL INC DIAG ANGIO  12/18/2017  . IR RADIOLOGIST EVAL & MGMT  01/31/2018  . RADIOLOGY WITH ANESTHESIA N/A 12/18/2017   Procedure: RADIOLOGY WITH  ANESTHESIA;  Surgeon: Luanne Bras, MD;  Location: [place];  Service: Radiology;  Laterality: N/A;    Allergies: Patient has no known allergies.  Medications: Prior to Admission medications   Medication Sig Start Date End Date Taking? Authorizing Provider  acetaminophen (TYLENOL) 500 MG tablet Take 1,000 mg by mouth every 8 (eight) hours as needed for moderate pain or headache.    [provider]  amoxicillin (AMOXIL) 500 MG capsule Take 1 capsule (500 mg total) by mouth 3 (three) times daily. 09/16/19   Ladell Pier, MD  aspirin 81 MG chewable tablet Chew 1 tablet (81 mg total) by mouth daily. 12/27/17   Angiulli, Lavon Paganini, PA-C  atorvastatin (LIPITOR) 10 MG tablet Take 1 tablet (10 mg total) by mouth daily at 6 PM. 03/25/19   Black, Lezlie Octave, NP  Blood Glucose Monitoring Suppl (TRUE METRIX METER) DEVI 1 kit by Does not apply route 4 (four) times daily. 03/27/19   Argentina Donovan, PA-C  Cholecalciferol (VITAMIN D3 PO) Take 1 tablet by mouth daily.    [provider]  citalopram (CELEXA) 10 MG tablet Take 10 mg by mouth daily.    [provider]  escitalopram (LEXAPRO) 10 MG tablet TAKE 1 TABLET BY MOUTH DAILY. 08/28/19   Ladell Pier, MD  glimepiride (AMARYL) 1 MG tablet Take 1 tablet (1 mg total) by mouth daily with breakfast. 05/17/19   Ladell Pier, MD  glucose blood (TRUE METRIX BLOOD GLUCOSE TEST)  test strip Use as instructed 03/27/19   Argentina Donovan, PA-C  levETIRAcetam (KEPPRA) 500 MG tablet Take 500 mg by mouth 2 (two) times daily.    [provider]  methocarbamol (ROBAXIN) 500 MG tablet Take 1 tablet (500 mg total) by mouth every 8 (eight) hours as needed for muscle spasms. 06/19/19   Argentina Donovan, PA-C  ticagrelor (BRILINTA) 90 MG TABS tablet Take 1 tablet (90 mg total) by mouth 2 (two) times daily. 08/30/19   Blanchie Dessert, MD  traMADol (ULTRAM) 50 MG tablet Take 1 tablet (50 mg total) by mouth every 8 (eight) hours  as needed. 09/19/19   Ladell Pier, MD  TRUEplus Lancets 28G MISC 28 g by Does not apply route QID. 03/27/19   Argentina Donovan, PA-C     Family History  Problem Relation Age of Onset  . Diabetes Mother   . Pulmonary disease Mother   . Heart disease Father   . Stroke Sister     Social History   Socioeconomic History  . Marital status: Single    Spouse name: Not on file  . Number of children: Not on file  . Years of education: Not on file  . Highest education level: Not on file  Occupational History  . Not on file  Tobacco Use  . Smoking status: Former Smoker    Packs/day: 0.50    Years: 30.00    Pack years: 15.00    Types: Cigarettes    Quit date: 12/04/2017    Years since quitting: 1.9  . Smokeless tobacco: Never Used  Substance and Sexual Activity  . Alcohol use: Not Currently    Comment: weekly  . Drug use: No  . Sexual activity: Not Currently    Birth control/protection: None  Other Topics Concern  . Not on file  Social History Narrative  . Not on file   Social Determinants of Health   Financial Resource Strain:   . Difficulty of Paying Living Expenses: Not on file  Food Insecurity:   . Worried About Charity fundraiser in the Last Year: Not on file  . Ran Out of Food in the Last Year: Not on file  Transportation Needs:   . Lack of Transportation (Medical): Not on file  . Lack of Transportation (Non-Medical): Not on file  Physical Activity:   . Days of Exercise per Week: Not on file  . Minutes of Exercise per Session: Not on file  Stress:   . Feeling of Stress : Not on file  Social Connections:   . Frequency of Communication with Friends and Family: Not on file  . Frequency of Social Gatherings with Friends and Family: Not on file  . Attends Religious Services: Not on file  . Active Member of Clubs or Organizations: Not on file  . Attends Archivist Meetings: Not on file  . Marital Status: Not on file     Review of Systems: A 12  point ROS discussed and pertinent positives are indicated in the HPI above.  All other systems are negative.  Review of Systems  Constitutional: Negative for activity change, appetite change, fatigue, fever and unexpected weight change.  HENT: Negative for tinnitus and trouble swallowing.   Eyes: Negative for visual disturbance.  Respiratory: Negative for cough and shortness of breath.   Cardiovascular: Negative for chest pain.  Gastrointestinal: Negative for abdominal pain.  Musculoskeletal: Negative for back pain, gait problem and joint swelling.  Neurological: Positive for dizziness  and headaches. Negative for tremors, seizures, syncope, facial asymmetry, speech difficulty, weakness, light-headedness and numbness.  Psychiatric/Behavioral: Negative for behavioral problems and confusion.    Vital Signs: BP (!) 134/94   Pulse 78   Temp 97.9 F (36.6 C) (Oral)   Resp 16   Ht _0  (1.727 m)   Wt 215 lb (97.5 kg)   SpO2 98%   BMI 32.69 kg/m   Physical Exam Vitals reviewed.  HENT:     Head: Atraumatic.     Mouth/Throat:     Mouth: Mucous membranes are moist.  Eyes:     Extraocular Movements: Extraocular movements intact.  Cardiovascular:     Rate and Rhythm: Normal rate and regular rhythm.     Heart sounds: Normal heart sounds.  Pulmonary:     Effort: Pulmonary effort is normal.     Breath sounds: Normal breath sounds.  Abdominal:     Palpations: Abdomen is soft.     Tenderness: There is no abdominal tenderness.  Musculoskeletal:        General: Normal range of motion.     Cervical back: Normal range of motion.     Right lower leg: No edema.     Left lower leg: No edema.  Skin:    General: Skin is warm and dry.  Neurological:     Mental Status: She is alert and oriented to person, place, and time.  Psychiatric:        Behavior: Behavior normal.        Thought Content: Thought content normal.        Judgment: Judgment normal.     Comments: Consents with Spanish  interpreter at bedside     Imaging: MR ANGIO HEAD WO CONTRAST  Result Date: 10/22/2019 CLINICAL DATA:  CVA. Arterial stenosis. EXAM: MRA HEAD WITHOUT CONTRAST TECHNIQUE: Angiographic images of the Circle of Willis were obtained using MRA technique without intravenous contrast. COMPARISON:  None. FINDINGS: POSTERIOR CIRCULATION: --Vertebral arteries: Normal V4 segments. --Posterior inferior cerebellar arteries (PICA): The right PICA and AICA share a common origin. Normal origin of the left PICA from the ipsilateral vertebral artery. --Anterior inferior cerebellar arteries (AICA): Patent origins from the basilar artery. --Basilar artery: Normal. --Superior cerebellar arteries: Normal. --Posterior cerebral arteries: Normal. There are bilateral posterior communicating arteries (p-comm) that partially supply the PCAs. ANTERIOR CIRCULATION: --Intracranial internal carotid arteries: Normal. --Anterior cerebral arteries (ACA): Normal. Hypoplastic left A1 segment, normal variant --Middle cerebral arteries (MCA): There is a stent within the right M1 segment. The M2 branches are patent. There is an old right MCA territory infarct. Normal left MCA. IMPRESSION: 1. No emergent large vessel occlusion or high-grade stenosis. 2. Patent right MCA stent. 3. Old right MCA territory infarct. Electronically Signed   By: Ulyses Jarred M.D.   On: 10/22/2019 22:24   MS DIGITAL SCREENING TOMO BILATERAL  Result Date: 10/10/2019 CLINICAL DATA:  Screening. EXAM: DIGITAL SCREENING BILATERAL MAMMOGRAM WITH TOMO AND CAD COMPARISON:  Previous exam(s). ACR Breast Density Category b: There are scattered areas of fibroglandular density. FINDINGS: There are no findings suspicious for malignancy. Images were processed with CAD. IMPRESSION: No mammographic evidence of malignancy. A result letter of this screening mammogram will be mailed directly to the patient. RECOMMENDATION: Screening mammogram in one year. (Code:SM-B-01Y) BI-RADS CATEGORY   1: Negative. Electronically Signed   By: Curlene Dolphin M.D.   On: 10/10/2019 07:51    Labs:  CBC: Recent Labs    11/18/18 1422 11/20/18 0518 03/23/19 1250 03/23/19  1308  WBC 8.8 7.9 9.5  --   HGB 15.0 12.9 14.2 15.3*  HCT 48.0* 41.0 43.8 45.0  PLT 262 259 268  --     COAGS: Recent Labs    11/18/18 1422 11/20/18 0518 03/23/19 1250  INR 0.92 0.98 1.0  APTT 28 33 20*    BMP: Recent Labs    11/18/18 1422 11/20/18 0518 03/23/19 1250 03/23/19 1308  NA 141 140 141 141  K 4.2 3.7 4.4 4.8  CL 110 111 109 108  CO2 27 23 21*  --   GLUCOSE 104* 95 141* 135*  BUN _0 CALCIUM 9.6 9.0 9.1  --   CREATININE 0.56 0.47 0.62 0.40*  GFRNONAA >60 >60 >60  --   GFRAA >60 >60 >60  --     LIVER FUNCTION TESTS: Recent Labs    11/18/18 1422 03/23/19 1250  BILITOT 0.7 0.7  AST 21 27  ALT 24 27  ALKPHOS 68 71  PROT 7.4 6.9  ALBUMIN 4.1 3.8    TUMOR MARKERS: No results for input(s): AFPTM, CEA, CA199, CHROMGRNA in the last 8760 hours.  Assessment and Plan:  Hx CVA Previous R MCA revascularization- angioplasty/stent 12/20/2017 Follows  with Dr Estanislado Pandy Taking asa/Brilinta daily Dizziness and left side headache  Scheduled for cerebral arteriogram  Risks and benefits of cerebral angiogram with intervention were discussed with the patient including, but not limited to bleeding, infection, vascular injury, contrast induced renal failure, stroke or even death.  This interventional procedure involves the use of X-rays and because of the nature of the planned procedure, it is possible that we will have prolonged use of X-ray fluoroscopy.  Potential radiation risks to you include (but are not limited to) the following: - A slightly elevated risk for cancer  several years later in life. This risk is typically less than 0.5% percent. This risk is low in comparison to the normal incidence of human cancer, which is 33% for women and 50% for men according to the [employer]. - Radiation induced injury can include skin redness, resembling a rash, tissue breakdown / ulcers and hair loss (which can be temporary or permanent).   The likelihood of either of these occurring depends on the difficulty of the procedure and whether you are sensitive to radiation due to previous procedures, disease, or genetic conditions.   IF your procedure requires a prolonged use of radiation, you will be notified and given written instructions for further action.  It is your responsibility to monitor the irradiated area for the 2 weeks following the procedure and to notify your physician if you are concerned that you have suffered a radiation induced injury.    All of the patient's questions were answered, patient is agreeable to proceed.  Consent signed and in chart.  Thank you for this interesting consult.  I greatly enjoyed meeting [facility] Emilio Aspen and look forward to participating in their care.  A copy of this report was sent to the requesting provider on this date.  Electronically Signed: Lavonia Drafts, PA-C 10/31/2019, 8:56 AM   I spent a total of  30 Minutes   in face to face in clinical consultation, greater than 50% of which was counseling/coordinating care for cerebral arteriogram

## 2019-10-31 NOTE — Sedation Documentation (Signed)
Nasal canula/ETCO2 monitor removed per Dr. Corliss Skains request. Patient oxygen saturation 100% on room air.

## 2019-10-31 NOTE — Discharge Instructions (Signed)
Radial Site Care  This sheet gives you information about how to care for yourself after your procedure. Your health care provider may also give you more specific instructions. If you have problems or questions, contact your health care provider. What can I expect after the procedure? After the procedure, it is common to have:  Bruising and tenderness at the catheter insertion area. Follow these instructions at home: Medicines  Take over-the-counter and prescription medicines only as told by your health care provider. Insertion site care  Follow instructions from your health care provider about how to take care of your insertion site. Make sure you: ? Wash your hands with soap and water before you change your bandage (dressing). If soap and water are not available, use hand sanitizer. ? Change your dressing as told by your health care provider. ? Leave stitches (sutures), skin glue, or adhesive strips in place. These skin closures may need to stay in place for 2 weeks or longer. If adhesive strip edges start to loosen and curl up, you may trim the loose edges. Do not remove adhesive strips completely unless your health care provider tells you to do that.  Check your insertion site every day for signs of infection. Check for: ? Redness, swelling, or pain. ? Fluid or blood. ? Pus or a bad smell. ? Warmth.  Do not take baths, swim, or use a hot tub until your health care provider approves.  You may shower 24-48 hours after the procedure, or as directed by your health care provider. ? Remove the dressing and gently wash the site with plain soap and water. ? Pat the area dry with a clean towel. ? Do not rub the site. That could cause bleeding.  Do not apply powder or lotion to the site. Activity   For 24 hours after the procedure, or as directed by your health care provider: ? Do not flex or bend the affected arm. ? Do not push or pull heavy objects with the affected arm. ? Do not  drive yourself home from the hospital or clinic. You may drive 24 hours after the procedure unless your health care provider tells you not to. ? Do not operate machinery or power tools.  Do not lift anything that is heavier than 10 lb (4.5 kg), or the limit that you are told, until your health care provider says that it is safe.  Ask your health care provider when it is okay to: ? Return to work or school. ? Resume usual physical activities or sports. ? Resume sexual activity. General instructions  If the catheter site starts to bleed, raise your arm and put firm pressure on the site. If the bleeding does not stop, get help right away. This is a medical emergency.  If you went home on the same day as your procedure, a responsible adult should be with you for the first 24 hours after you arrive home.  Keep all follow-up visits as told by your health care provider. This is important. Contact a health care provider if:  You have a fever.  You have redness, swelling, or yellow drainage around your insertion site. Get help right away if:  You have unusual pain at the radial site.  The catheter insertion area swells very fast.  The insertion area is bleeding, and the bleeding does not stop when you hold steady pressure on the area.  Your arm or hand becomes pale, cool, tingly, or numb. These symptoms may represent a serious problem   that is an emergency. Do not wait to see if the symptoms will go away. Get medical help right away. Call your local emergency services (911 in the [place] Do not drive yourself to the hospital. Summary  After the procedure, it is common to have bruising and tenderness at the site.  Follow instructions from your health care provider about how to take care of your radial site wound. Check the wound every day for signs of infection.  Do not lift anything that is heavier than 10 lb (4.5 kg), or the limit that you are told, until your health care provider says  that it is safe. This information is not intended to replace advice given to you by your health care provider. Make sure you discuss any questions you have with your health care provider. Document Revised: 10/25/2017 Document Reviewed: 10/25/2017 Elsevier Patient Education  2020 [employer]  

## 2019-10-31 NOTE — Telephone Encounter (Signed)
Patient refill request -

## 2019-10-31 NOTE — Telephone Encounter (Signed)
Please fill if appropriate request 

## 2019-10-31 NOTE — Procedures (Signed)
S/P 4 vessel cerebral arteriogram RT radial approach. Findings. 1.Approx 50 to 70 % patency of RT MCA stent NO angio evidence of neointimal hyoerplasia. S.Hy Swiatek MD paten

## 2019-11-05 ENCOUNTER — Telehealth: Payer: Self-pay

## 2019-11-05 NOTE — Telephone Encounter (Signed)
[employer] [place]  Id# [phone]  contacted pt to go over MRI appointment. MRI is schedule for November 13, 2018 at 3pm at [facility]. Pt is to arrive at 230pm.  Went over appointment information with pt and pt states she is unable to make that appointment. Provided pt with the number to call and reschedule MRI

## 2019-11-13 ENCOUNTER — Ambulatory Visit (INDEPENDENT_AMBULATORY_CARE_PROVIDER_SITE_OTHER): Payer: Self-pay | Admitting: Adult Health

## 2019-11-13 ENCOUNTER — Encounter: Payer: Self-pay | Admitting: Adult Health

## 2019-11-13 ENCOUNTER — Other Ambulatory Visit: Payer: Self-pay

## 2019-11-13 VITALS — BP 118/83 | HR 73 | Temp 97.7°F | Ht 68.0 in | Wt 221.4 lb

## 2019-11-13 DIAGNOSIS — I10 Essential (primary) hypertension: Secondary | ICD-10-CM

## 2019-11-13 DIAGNOSIS — I63311 Cerebral infarction due to thrombosis of right middle cerebral artery: Secondary | ICD-10-CM

## 2019-11-13 DIAGNOSIS — I69398 Other sequelae of cerebral infarction: Secondary | ICD-10-CM

## 2019-11-13 DIAGNOSIS — E785 Hyperlipidemia, unspecified: Secondary | ICD-10-CM

## 2019-11-13 DIAGNOSIS — E669 Obesity, unspecified: Secondary | ICD-10-CM

## 2019-11-13 DIAGNOSIS — R569 Unspecified convulsions: Secondary | ICD-10-CM

## 2019-11-13 DIAGNOSIS — E1169 Type 2 diabetes mellitus with other specified complication: Secondary | ICD-10-CM

## 2019-11-13 MED ORDER — ROSUVASTATIN CALCIUM 5 MG PO TABS: 5.0000 mg | ORAL_TABLET | Freq: Every day | ORAL | 2 refills | Status: DC

## 2019-11-13 NOTE — Progress Notes (Deleted)
[facility] [address] [place]. [place] [address] [phone]       OFFICE FOLLOW-UP NOTE  [name] Date of Birth:  1956-03-20 Medical Record Number:  [id number]   Chief Complaint  Patient presents with  . Follow-up    2 mon f/u. Interpreter present. Treatment room. No new concerns at this time.      HPI: Ms Heather [name] is a 64 year old pleasant Hispanic lady who seen today for first office follow-up visit following hospital admission for stroke in March 2019. She is accompanied by her daughter as well as Spanish language interpreter was present throughout this visit. History is obtained from them as well as review of electronic medical records. I have personally reviewed imaging films.Heather Penais an 64 y.o.femaleHispanic with PMH of HTN,Obesity who presents [name] emergency room as a stroke alert with left hemiparesis. Patient is Spanish-speaking and initially EMS was told that her symptoms began at 9 PM after she dropped a glass in her left hand. CT head showed no hemorrhage and tPA was mixed. However after nurse spoke to her in [place], patient stated that she had a headache that began around 6:30pm and had left side weakness as she was outside window period upon completion of CT scan.  CTA head and neck was performed which showed a superior division M2 cutoff. After discussing with family given her borderline NIHSS of 7, however worsening symptoms ( from EMS assessment to arrival at hospital) we decided to take the patient for mechanical thrombectomy. Date last known well:3.17.19Time last known well:around 6.30 pm tPA Given:no, outside window at time of CT scan NIHSS:7 ( at time of arrival) Baseline MRS0  . After written informed consent from the patient and family She was taken to the interventional radiology suite for thrombectomy and right middle cerebral artery  Rescue stent placement performed by Dr. Estanislado Pandy for progressive  occlusion of the inferior division of the right middle cerebral artery due to underlying stenosis. She also received 10 mg of superselective into arterial Integrilin. She was given loading dose of 180 mg of Brilinta and aspirin. The patient was initially and in the neurological intensive care unit and closely monitored she did well. MRI scan of the brain showed a moderate-sized right MCA infarct.Marland Kitchen LDL cholesterol 67 mg percent and women A1c was 6.7. Transthoracic echo showed normal ejection fraction. Patient had initial right gaze preference and left hemiplegia but made gradual improvement. She was seen by physical occupational and speech therapy. She was transferred to inpatient rehabilitation and has done well.   05/16/2018 visit: She is currently living at home. She is able to ambulate without assistance. She has only mild diminished fine motor skills in the left hand but otherwise appears to have made a full recovery. She feels she is 85% better. She does complain of decreased stamina and gets tired easily. She is eating healthy and is active and in fact has lost weight. Her fasting sugars have all ranged below 150 and blood pressure is well controlled and it is 106/80 today. She did undergo follow-up cerebral catheter angiogram by Dr. Estanislado Pandy on 03/06/18 which I have personally reviewed showed 50% right middle cerebral artery IntraStent restenosis but good flow distally.  Interval history 08/16/2018: patient is being seen today for follow up and is accompanied by interpreter and daughter. She states she has been doing well. She denies any residual left hand weakness but does endorse pain in left wrist intermittently along with finger joints.  Comes  quick and then goes away.  She also endorses right sided intermittent neck pain that has been present since Friday.  She has tried 1 dose of Tylenol which did help but has not tried again.  She denies any other, neurological deficit and states this neck pain is  not debilitating.  Denies any type of trauma or injury.  She did experience this after hospital discharge but subsided and has recently returned.  Denies any type of headache, migraine, visual loss or dizziness.  Continues on aspirin and brilinta without side effects of bleeding but does endorse easily bruising. Glucose levels have been stable as she does monitor at home. Blood pressure today 99/71. She will be following up with Dr. Estanislado Pandy next month for repeat imaging and length of DAPT will be determined at that time.  She also endorses approximately 50 pound weight loss since her stroke from diet and exercise.  No further concerns at this time.  Denies new or worsening stroke/TIA symptoms. Update 11/26/2018 : She returns for follow-up after last visit 3 months ago.  She is accompanied by a [place] language interpreter who was present throughout this visit.  Patient was recently hospitalized to [facility] on 11/19/2017 and I personally reviewed hospital records and imaging films.  She woke up that day and felt her face was drawing up with some twitching's on the right side which lasted 10 minutes.  Her symptoms cleared by the time she reached the hospital.  CT angiogram of the brain was obtained emergently which showed 50% IntraStent stenosis.  She was admitted for overnight observation and underwent MRI scan of the brain which showed no acute stroke.  She underwent diagnostic cerebral catheter angiogram on 11/20/18 by Dr Estanislado Pandy which showed only 30 to 40% IntraStent restenosis with excellent flow distally.  EEG was normal.  Patient symptoms were felt to be of unclear etiology and she was recommended to continue aspirin and Brilinta and maintain aggressive risk factor modification.  She states she is done well since then she has had no recurrent facial twitching or drawing.  She is had no other stroke or TIA symptoms.  She is tolerating aspirin and Brilinta well without bleeding or bruising.  Her  blood pressure is well controlled and today it is 104/79.  She is tolerating Lipitor well without muscle aches and pains.  Her last LDL cholesterol was 87 mg percent on 11/19/2018.  She states her sugars are well controlled and last hemoglobin A1c was 5.4 on 11/19/2018.  She states she is eating healthy and has lost 60 pounds over the last 1 year.  She has no new complaints today.  05/29/2019 update: [name] is a 64 year old female who is being seen today for follow-up as well as recent onset of seizures on 03/23/2019.  Accompanied at today's visit by interpreter.  Presented to [facility] ED with tonic-clonic seizure activity at home with additional episode in the emergency room with facial twitching, left arm being tense and continued to full tonic-clonic seizure.  She continued to have seizures until she received Ativan and then subsided.  No prior history of seizure disorder.  Initiated Keppra.  EEG normal.  She has had no recurrent seizure activity since this time.  She does report decreased appetite, fatigue and worsening depression since initiating Keppra.  She has recently been switched antidepressants with mild improvement but continues to experience above symptoms.  She does have increased anxiety regarding recent seizure activity and possibility of recurrent seizure activity.  She  continues to live with her granddaughter but is able to maintain ADLs independently.  She also continues to have right-sided neck pain radiating to shoulder which was recently evaluated by PCP who recommended follow-up if no benefit with conservative measures.  She has not followed up at this time.  Continues on aspirin and Brilinta without bleeding or bruising.  Continues on atorvastatin without myalgias.  Blood pressure stable today at 120/88.  No further concerns at this time.  Update 11/13/2019: [name] is a 64 year old female who is being seen today for stroke and seizure follow-up.  At prior visit, concerns regarding Keppra  side effect and recommended switching to Depakote with slowly titrating down on Keppra and up on Depakote.  She was advised to follow-up 2 months later for reevaluation.  Since prior visit approximately 6 months ago, she is currently on ***.  She denies recent seizure activity.  Continues on aspirin and Brilinta without bleeding or bruising.  Continues on atorvastatin without myalgias.  Blood pressures today ***.  Denies new or worsening stroke/TIA symptoms.      ROS:   14 system review of systems is positive for seizure and weakness and all other systems negative  PMH:  Past Medical History:  Diagnosis Date  . Depression   . Diabetes ([place])   . Hypertension   . Seizure ([place])   . Stroke [facility])     Social History:  Social History   Socioeconomic History  . Marital status: Single    Spouse name: Not on file  . Number of children: Not on file  . Years of education: Not on file  . Highest education level: Not on file  Occupational History  . Not on file  Tobacco Use  . Smoking status: Former Smoker    Packs/day: 0.50    Years: 30.00    Pack years: 15.00    Types: Cigarettes    Quit date: 12/04/2017    Years since quitting: 1.9  . Smokeless tobacco: Never Used  Substance and Sexual Activity  . Alcohol use: Not Currently    Comment: weekly  . Drug use: No  . Sexual activity: Not Currently    Birth control/protection: None  Other Topics Concern  . Not on file  Social History Narrative  . Not on file   Social Determinants of Health   Financial Resource Strain:   . Difficulty of Paying Living Expenses: Not on file  Food Insecurity:   . Worried About Charity fundraiser in the Last Year: Not on file  . Ran Out of Food in the Last Year: Not on file  Transportation Needs:   . Lack of Transportation (Medical): Not on file  . Lack of Transportation (Non-Medical): Not on file  Physical Activity:   . Days of Exercise per Week: Not on file  . Minutes of Exercise per Session:  Not on file  Stress:   . Feeling of Stress : Not on file  Social Connections:   . Frequency of Communication with Friends and Family: Not on file  . Frequency of Social Gatherings with Friends and Family: Not on file  . Attends Religious Services: Not on file  . Active Member of Clubs or Organizations: Not on file  . Attends Archivist Meetings: Not on file  . Marital Status: Not on file  Intimate Partner Violence:   . Fear of Current or Ex-Partner: Not on file  . Emotionally Abused: Not on file  . Physically Abused: Not  on file  . Sexually Abused: Not on file    Medications:   Current Outpatient Medications on File Prior to Visit  Medication Sig Dispense Refill  . acetaminophen (TYLENOL) 500 MG tablet Take 1,000 mg by mouth every 8 (eight) hours as needed for moderate pain or headache.    Marland Kitchen aspirin 81 MG chewable tablet Chew 1 tablet (81 mg total) by mouth daily.    . Blood Glucose Monitoring Suppl (TRUE METRIX METER) DEVI 1 kit by Does not apply route 4 (four) times daily. 1 Device 0  . BRILINTA 90 MG TABS tablet TAKE 1 TABLET (90 MG TOTAL) BY MOUTH 2 (TWO) TIMES DAILY. 60 tablet 3  . Cholecalciferol (VITAMIN D3 PO) Take 1 tablet by mouth daily.    . citalopram (CELEXA) 10 MG tablet Take 10 mg by mouth daily.    Marland Kitchen escitalopram (LEXAPRO) 10 MG tablet TAKE 1 TABLET BY MOUTH DAILY. 30 tablet 2  . glimepiride (AMARYL) 1 MG tablet Take 1 tablet (1 mg total) by mouth daily with breakfast. 30 tablet 5  . glucose blood (TRUE METRIX BLOOD GLUCOSE TEST) test strip Use as instructed 100 each 11  . levETIRAcetam (KEPPRA) 500 MG tablet TAKE 1 TABLET (500 MG TOTAL) BY MOUTH 2 (TWO) TIMES DAILY. 60 tablet 5  . traMADol (ULTRAM) 50 MG tablet TAKE 1 TABLET (50 MG TOTAL) BY MOUTH EVERY 8 (EIGHT) HOURS AS NEEDED. 60 tablet 0  . TRUEplus Lancets 28G MISC 28 g by Does not apply route QID. 120 each 3   No current facility-administered medications on file prior to visit.    Allergies:  No  Known Allergies  Physical Exam General: Mildly obese pleasant middle-age  Hispanic Spanish-speaking lady, seated, in no evident distress Head: head normocephalic and atraumatic.  Neck: supple with no carotid or supraclavicular bruits Cardiovascular: regular rate and rhythm, no murmurs Musculoskeletal: no deformity Skin:  no rash/petichiae Vascular:  Normal pulses all extremities Vitals:   11/13/19 0749  BP: 118/83  Pulse: 73  Temp: 97.7 F (36.5 C)   Neurologic Exam Mental Status: Awake and fully alert. Oriented to place and time. Recent and remote memory intact. Attention span, concentration and fund of knowledge appropriate. Mood and affect appropriate.  Cranial Nerves: Fundoscopic exam not done. Pupils equal, briskly reactive to light. Extraocular movements full without nystagmus. Visual fields full to confrontation. Hearing intact. Facial sensation intact. Face, tongue, palate moves normally and symmetrically.  Motor: Normal bulk and tone. Normal strength in all tested extremity muscles.  Mild diminished fine finger movements on the left.  Sensory.: intact to touch ,pinprick .position and vibratory sensation.  Coordination: Rapid alternating movements normal in all extremities. Finger-to-nose and heel-to-shin performed accurately bilaterally. Gait and Station: Arises from chair without difficulty. Stance is normal. Gait demonstrates normal stride length and balance . Able to heel, toe and tandem walk without difficulty.  Reflexes: 1+ and symmetric. Toes downgoing.      ASSESSMENT: 64 year old Hispanic lady with right MCA infarct in March 2019 secondary to right MCA stenosis treated with acute angioplasty and stenting with excellent clinical recovery. Vascular risk factors of intracranial stenosis, diabetes, hypertension and hyperlipidemia.  Prior hospitalization 11/18/2018 with transient facial twitchings of unclear etiology with negative EEG and negative neuroimaging for recurrent  stroke or significant restenosis of intracranial stent.  Presented to ED on 03/23/2019 with tonic-clonic seizure activity with Keppra initiated.  She has not had any recurrent seizure activity or symptoms since initiating Keppra but has been experiencing decreased  appetite, fatigue and worsening depression since initiating Keppra.     PLAN: -Due to potential Keppra side effects, it will be recommended to change therapies at this time. -Recommend initiating Vimpat 50 mg twice daily in addition to Keppra 500 mg twice daily for 2 weeks.  She will then decrease Keppra dose to 500 mg daily and increase Vimpat dose to 100 mg twice daily for a total of 2 weeks.  After that time, she will discontinue Keppra and increase Vimpat dose to 150 mg twice daily.  Interpreter assisted with providing patient with these instructions who verbalized understanding -Advised to continue to follow with PCP for ongoing depression management -Continue aspirin 81 mg and Brilinta and atorvastatin 10 mg daily for secondary stroke prevention -Continue to follow with Dr. Estanislado Pandy for ongoing monitoring and follow-up regarding intracranial stent -Continue to follow with PCP for HTN, HLD and DM management -maintain strict control of hypertension with blood pressure goal below 130/90, diabetes with hemoglobin A1c goal below 6.5% and lipids with LDL cholesterol goal below 70 mg/dL. I also advised the patient to eat a healthy diet with plenty of whole grains, cereals, fruits and vegetables, exercise regularly and maintain ideal body weight  Follow-up in 2 months or call earlier if needed  Greater than 50% of time during this 25 minute visit was spent on counseling, discussion regarding recent seizure onset likely related to prior right MCA stroke, explanation of diagnosis of intracranials tenosis, stenting, stroke, planning of further management, discussion with patient and answering all questions to patient satisfaction    [facility]), AGNP-BC  [facility] [address] [place], [place] [address]  Phone [phone] Fax [phone] Note: This document was prepared with digital dictation and possible smart phrase technology. Any transcriptional errors that result from this process are unintentional.

## 2019-11-13 NOTE — Progress Notes (Signed)
I agree with the above plan 

## 2019-11-13 NOTE — Progress Notes (Signed)
[facility] [address] [place]. [place] [address] [phone]       OFFICE FOLLOW-UP NOTE  [name] Date of Birth:  1956-03-20 Medical Record Number:  [id number]   Chief Complaint  Patient presents with  . Follow-up    2 mon f/u. Interpreter present. Treatment room. No new concerns at this time.      HPI: Ms Heather [name] is a 64 year old pleasant Hispanic lady who seen today for first office follow-up visit following hospital admission for stroke in March 2019. She is accompanied by her daughter as well as Spanish language interpreter was present throughout this visit. History is obtained from them as well as review of electronic medical records. I have personally reviewed imaging films.Heather [name] an 64 y.o.femaleHispanic with PMH of HTN,Obesity who presents [name] emergency room as a stroke alert with left hemiparesis. Patient is Spanish-speaking and initially EMS was told that her symptoms began at 9 PM after she dropped a glass in her left hand. CT head showed no hemorrhage and tPA was mixed. However after nurse spoke to her in [place], patient stated that she had a headache that began around 6:30pm and had left side weakness as she was outside window period upon completion of CT scan.  CTA head and neck was performed which showed a superior division M2 cutoff. After discussing with family given her borderline NIHSS of 7, however worsening symptoms ( from EMS assessment to arrival at hospital) we decided to take the patient for mechanical thrombectomy. Date last known well:3.17.19Time last known well:around 6.30 pm tPA Given:no, outside window at time of CT scan NIHSS:7 ( at time of arrival) Baseline MRS0  . After written informed consent from the patient and family She was taken to the interventional radiology suite for thrombectomy and right middle cerebral artery  Rescue stent placement performed by Dr. Estanislado Pandy for progressive  occlusion of the inferior division of the right middle cerebral artery due to underlying stenosis. She also received 10 mg of superselective into arterial Integrilin. She was given loading dose of 180 mg of Brilinta and aspirin. The patient was initially and in the neurological intensive care unit and closely monitored she did well. MRI scan of the brain showed a moderate-sized right MCA infarct.Marland Kitchen LDL cholesterol 67 mg percent and women A1c was 6.7. Transthoracic echo showed normal ejection fraction. Patient had initial right gaze preference and left hemiplegia but made gradual improvement. She was seen by physical occupational and speech therapy. She was transferred to inpatient rehabilitation and has done well.   05/16/2018 visit: She is currently living at home. She is able to ambulate without assistance. She has only mild diminished fine motor skills in the left hand but otherwise appears to have made a full recovery. She feels she is 85% better. She does complain of decreased stamina and gets tired easily. She is eating healthy and is active and in fact has lost weight. Her fasting sugars have all ranged below 150 and blood pressure is well controlled and it is 106/80 today. She did undergo follow-up cerebral catheter angiogram by Dr. Estanislado Pandy on 03/06/18 which I have personally reviewed showed 50% right middle cerebral artery IntraStent restenosis but good flow distally.  Interval history 08/16/2018: patient is being seen today for follow up and is accompanied by interpreter and daughter. She states she has been doing well. She denies any residual left hand weakness but does endorse pain in left wrist intermittently along with finger joints.  Comes  quick and then goes away.  She also endorses right sided intermittent neck pain that has been present since Friday.  She has tried 1 dose of Tylenol which did help but has not tried again.  She denies any other, neurological deficit and states this neck pain is  not debilitating.  Denies any type of trauma or injury.  She did experience this after hospital discharge but subsided and has recently returned.  Denies any type of headache, migraine, visual loss or dizziness.  Continues on aspirin and brilinta without side effects of bleeding but does endorse easily bruising. Glucose levels have been stable as she does monitor at home. Blood pressure today 99/71. She will be following up with Dr. Estanislado Pandy next month for repeat imaging and length of DAPT will be determined at that time.  She also endorses approximately 50 pound weight loss since her stroke from diet and exercise.  No further concerns at this time.  Denies new or worsening stroke/TIA symptoms. Update 11/26/2018 : She returns for follow-up after last visit 3 months ago.  She is accompanied by a [place] language interpreter who was present throughout this visit.  Patient was recently hospitalized to [facility] on 11/19/2017 and I personally reviewed hospital records and imaging films.  She woke up that day and felt her face was drawing up with some twitching's on the right side which lasted 10 minutes.  Her symptoms cleared by the time she reached the hospital.  CT angiogram of the brain was obtained emergently which showed 50% IntraStent stenosis.  She was admitted for overnight observation and underwent MRI scan of the brain which showed no acute stroke.  She underwent diagnostic cerebral catheter angiogram on 11/20/18 by Dr Estanislado Pandy which showed only 30 to 40% IntraStent restenosis with excellent flow distally.  EEG was normal.  Patient symptoms were felt to be of unclear etiology and she was recommended to continue aspirin and Brilinta and maintain aggressive risk factor modification.  She states she is done well since then she has had no recurrent facial twitching or drawing.  She is had no other stroke or TIA symptoms.  She is tolerating aspirin and Brilinta well without bleeding or bruising.  Her  blood pressure is well controlled and today it is 104/79.  She is tolerating Lipitor well without muscle aches and pains.  Her last LDL cholesterol was 87 mg percent on 11/19/2018.  She states her sugars are well controlled and last hemoglobin A1c was 5.4 on 11/19/2018.  She states she is eating healthy and has lost 60 pounds over the last 1 year.  She has no new complaints today.  05/29/2019 update: [name] is a 64 year old female who is being seen today for follow-up as well as recent onset of seizures on 03/23/2019.  Accompanied at today's visit by interpreter.  Presented to [facility] ED with tonic-clonic seizure activity at home with additional episode in the emergency room with facial twitching, left arm being tense and continued to full tonic-clonic seizure.  She continued to have seizures until she received Ativan and then subsided.  No prior history of seizure disorder.  Initiated Keppra.  EEG normal.  She has had no recurrent seizure activity since this time.  She does report decreased appetite, fatigue and worsening depression since initiating Keppra.  She has recently been switched antidepressants with mild improvement but continues to experience above symptoms.  She does have increased anxiety regarding recent seizure activity and possibility of recurrent seizure activity.  She  continues to live with her granddaughter but is able to maintain ADLs independently.  She also continues to have right-sided neck pain radiating to shoulder which was recently evaluated by PCP who recommended follow-up if no benefit with conservative measures.  She has not followed up at this time.  Continues on aspirin and Brilinta without bleeding or bruising.  Continues on atorvastatin without myalgias.  Blood pressure stable today at 120/88.  No further concerns at this time.  Update 11/13/2019: [name] is a 64 year old female who is being seen today for stroke and seizure follow-up accompanied by interpreter. She is doing  well since last visit.  At prior visit, discussion regarding possibly discontinuing Keppra and initiating Vimpat due to possible side effects of depression but apparently this was not done as she has continued on Keppra 500 mg twice daily since prior visit.  Depression has greatly improved with start of SSRI and denies any recent seizure activity.  She did not initiate Vimpat.  Atorvastatin discontinued at some point due to concerns of myalgias and not restarted on a different statin.  Prior lipid panel showed LDL 46.  Underwent cerebral angiogram with Dr. Estanislado Pandy on 10/31/2019 which showed 50 to 70% patency of the stented segment of right MCA and recommended MRA in 6 months time.  Continues on aspirin and Brilinta as instructed by IR without bleeding or bruising.  Blood pressure today 118/83. She does report right ear pain that started after her cerebral angiogram last week along with right shoulder pain. She will be getting an MRI of her right shoulder tomorrow but per review PCP note, shoulder pain has been chronic. Denies new or worsening stroke/TIA symptoms.  No further concerns at this time.      ROS:   14 system review of systems is positive for ear pain, shoulder pain and all other systems negative  PMH:  Past Medical History:  Diagnosis Date  . Depression   . Diabetes ([place])   . Hypertension   . Seizure ([place])   . Stroke [facility])     Social History:  Social History   Socioeconomic History  . Marital status: Single    Spouse name: Not on file  . Number of children: Not on file  . Years of education: Not on file  . Highest education level: Not on file  Occupational History  . Not on file  Tobacco Use  . Smoking status: Former Smoker    Packs/day: 0.50    Years: 30.00    Pack years: 15.00    Types: Cigarettes    Quit date: 12/04/2017    Years since quitting: 1.9  . Smokeless tobacco: Never Used  Substance and Sexual Activity  . Alcohol use: Not Currently    Comment: weekly    . Drug use: No  . Sexual activity: Not Currently    Birth control/protection: None  Other Topics Concern  . Not on file  Social History Narrative  . Not on file   Social Determinants of Health   Financial Resource Strain:   . Difficulty of Paying Living Expenses: Not on file  Food Insecurity:   . Worried About Charity fundraiser in the Last Year: Not on file  . Ran Out of Food in the Last Year: Not on file  Transportation Needs:   . Lack of Transportation (Medical): Not on file  . Lack of Transportation (Non-Medical): Not on file  Physical Activity:   . Days of Exercise per Week: Not on  file  . Minutes of Exercise per Session: Not on file  Stress:   . Feeling of Stress : Not on file  Social Connections:   . Frequency of Communication with Friends and Family: Not on file  . Frequency of Social Gatherings with Friends and Family: Not on file  . Attends Religious Services: Not on file  . Active Member of Clubs or Organizations: Not on file  . Attends Archivist Meetings: Not on file  . Marital Status: Not on file  Intimate Partner Violence:   . Fear of Current or Ex-Partner: Not on file  . Emotionally Abused: Not on file  . Physically Abused: Not on file  . Sexually Abused: Not on file    Medications:   Current Outpatient Medications on File Prior to Visit  Medication Sig Dispense Refill  . acetaminophen (TYLENOL) 500 MG tablet Take 1,000 mg by mouth every 8 (eight) hours as needed for moderate pain or headache.    Marland Kitchen aspirin 81 MG chewable tablet Chew 1 tablet (81 mg total) by mouth daily.    . Blood Glucose Monitoring Suppl (TRUE METRIX METER) DEVI 1 kit by Does not apply route 4 (four) times daily. 1 Device 0  . BRILINTA 90 MG TABS tablet TAKE 1 TABLET (90 MG TOTAL) BY MOUTH 2 (TWO) TIMES DAILY. 60 tablet 3  . Cholecalciferol (VITAMIN D3 PO) Take 1 tablet by mouth daily.    . citalopram (CELEXA) 10 MG tablet Take 10 mg by mouth daily.    Marland Kitchen escitalopram  (LEXAPRO) 10 MG tablet TAKE 1 TABLET BY MOUTH DAILY. 30 tablet 2  . glimepiride (AMARYL) 1 MG tablet Take 1 tablet (1 mg total) by mouth daily with breakfast. 30 tablet 5  . glucose blood (TRUE METRIX BLOOD GLUCOSE TEST) test strip Use as instructed 100 each 11  . levETIRAcetam (KEPPRA) 500 MG tablet TAKE 1 TABLET (500 MG TOTAL) BY MOUTH 2 (TWO) TIMES DAILY. 60 tablet 5  . traMADol (ULTRAM) 50 MG tablet TAKE 1 TABLET (50 MG TOTAL) BY MOUTH EVERY 8 (EIGHT) HOURS AS NEEDED. 60 tablet 0  . TRUEplus Lancets 28G MISC 28 g by Does not apply route QID. 120 each 3   No current facility-administered medications on file prior to visit.    Allergies:  No Known Allergies  Physical Exam  Today's Vitals   11/13/19 0749  BP: 118/83  Pulse: 73  Temp: 97.7 F (36.5 C)  TempSrc: Oral  Weight: 221 lb 6.4 oz (100.4 kg)  Height: _0  (1.727 m)   Body mass index is 33.66 kg/m.   General: Well-nourished, well-developed pleasant middle-age  Hispanic Spanish-speaking lady, seated, in no evident distress Head: head normocephalic and atraumatic. Bilateral ears TM intact and reactive to light without evidence of fluid or infection. Neck: supple with no carotid or supraclavicular bruits Cardiovascular: regular rate and rhythm, no murmurs Musculoskeletal: no deformity Skin:  no rash/petichiae Vascular:  Normal pulses all extremities  Neurologic Exam Mental Status: Awake and fully alert. Oriented to place and time. Recent and remote memory intact. Attention span, concentration and fund of knowledge appropriate. Mood and affect appropriate.  Cranial Nerves: Pupils equal, briskly reactive to light. Extraocular movements full without nystagmus. Visual fields full to confrontation. Hearing intact. Facial sensation intact. Face, tongue, palate moves normally and symmetrically.  Motor: Normal bulk and tone. Normal strength in all tested extremity muscles.  Sensory.: intact to touch ,pinprick .position and  vibratory sensation.  Coordination: Rapid alternating movements normal  in all extremities. Finger-to-nose and heel-to-shin performed accurately bilaterally. Gait and Station: Arises from chair without difficulty. Stance is normal. Gait demonstrates normal stride length and balance . Able to heel, toe and tandem walk without difficulty.  Reflexes: 1+ and symmetric. Toes downgoing.      ASSESSMENT: 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 Vascular risk factors of intracranial stenosis, diabetes, hypertension and hyperlipidemia.  Prior hospitalization 11/18/2018 with transient facial twitchings of unclear etiology. Presented to ED on 03/23/2019 with tonic-clonic seizure activity with Keppra initiated.  She has not had any recurrent seizure activity. Depression symptoms have improved since last visit 6 months ago.  Repeat cerebral angiogram with IR last week which showed 50 to 70% patency of the stented segment of right MCA and reports right ear and shoulder pain that started post cerebral arteriogram last week.      PLAN: Right MCA infract-  Continue aspirin 81 mg and Brilinta for secondary stroke prevention. Will start patient on crestor 5 mg for HLD management secondary stroke prevention. Maintain strict control of hypertension with blood pressure goal below 130/90, diabetes with hemoglobin A1c goal below 6.5% and lipids with LDL cholesterol goal below 70 mg/dL. I also advised the patient to eat a healthy diet with plenty of whole grains, cereals, fruits and vegetables, exercise regularly and maintain ideal body weight Seizure post stroke- Continue taking Keppra 500 mg BID for seizure prophylaxis Intercranial stenosis-Continue to follow with Dr. Estanislado Pandy for ongoing monitoring and follow-up regarding intracranial stent with ongoing use of  aspirin and Brilinta and MRA 6 months post recent procedure. HTN-continue current regimen and ongoing follow-up with PCP for monitoring and management. BP today 118/83.  HLD- initiate  crestor 5 mg and follow up with PCP in regards to her cholesterol within the next 4-6 weeks for repeat lab work and ongoing monitoring and management. Right Ear and shoulder pain-endorses pain post cerebral angiogram but per PCP note, chronic shoulder pain and plans on undergoing MRI tomorrow.  Normal appearance of TM bilaterally without evidence of fluid or infection.  Advised ongoing monitoring management and further evaluation if indicated by PCP   Follow-up in 6 months or call earlier if needed   Greater than 50% of time during this 30 minute visit was spent on counseling, discussion regarding seizure onset post right MCA stroke, explanation of diagnosis of intracranial stenosis and ongoing follow-up with IR, discussion regarding importance of managing secondary stroke risk factors, planning of further management, discussion with patient and answering all questions to patient satisfaction    Frann Rider, AGNP-BC  [facility] [address] [place], [place] [address]  Phone [phone] Fax [phone] Note: This document was prepared with digital dictation and possible smart phrase technology. Any transcriptional errors that result from this process are unintentional.

## 2019-11-13 NOTE — Patient Instructions (Signed)
Continue aspirin 81 mg daily and Brilinta (ticagrelor) 90 mg bid  for secondary stroke prevention  Recommend initiating Crestor 5 mg daily as you have difficulty tolerating atorvastatin.  Please ensure you follow-up with your PCP for repeat lab work in approximately 4 to 6 weeks  Continue to follow up with PCP regarding cholesterol, diabetes and blood pressure management   Continue Keppra 500 mg twice daily for seizure prophylaxis  Continue to monitor blood pressure at home  Maintain strict control of hypertension with blood pressure goal below 130/90, diabetes with hemoglobin A1c goal below 6.5% and cholesterol with LDL cholesterol (bad cholesterol) goal below 70 mg/dL. I also advised the patient to eat a healthy diet with plenty of whole grains, cereals, fruits and vegetables, exercise regularly and maintain ideal body weight.  Followup in the future with me in 6 months or call earlier if needed       Thank you for coming to see [place] at [facility]. I hope we have been able to provide you high quality care today.  You may receive a patient satisfaction survey over the next few weeks. We would appreciate your feedback and comments so that we may continue to improve ourselves and the health of our patients.

## 2019-11-14 ENCOUNTER — Ambulatory Visit (HOSPITAL_COMMUNITY): Admission: RE | Admit: 2019-11-14 | Payer: Self-pay | Source: Ambulatory Visit

## 2019-11-28 ENCOUNTER — Other Ambulatory Visit: Payer: Self-pay | Admitting: Internal Medicine

## 2019-11-28 DIAGNOSIS — F329 Major depressive disorder, single episode, unspecified: Secondary | ICD-10-CM

## 2019-11-28 DIAGNOSIS — F32A Depression, unspecified: Secondary | ICD-10-CM

## 2019-11-28 DIAGNOSIS — M25511 Pain in right shoulder: Secondary | ICD-10-CM

## 2019-11-28 DIAGNOSIS — G8929 Other chronic pain: Secondary | ICD-10-CM

## 2019-12-01 ENCOUNTER — Ambulatory Visit (HOSPITAL_COMMUNITY): Admission: RE | Admit: 2019-12-01 | Payer: No Typology Code available for payment source | Source: Ambulatory Visit

## 2019-12-02 ENCOUNTER — Ambulatory Visit: Payer: Self-pay | Attending: Internal Medicine | Admitting: Internal Medicine

## 2019-12-02 ENCOUNTER — Other Ambulatory Visit: Payer: Self-pay

## 2019-12-02 VITALS — BP 106/68 | HR 82 | Temp 98.0°F | Resp 16 | Ht 66.0 in | Wt 222.0 lb

## 2019-12-02 DIAGNOSIS — G8929 Other chronic pain: Secondary | ICD-10-CM

## 2019-12-02 DIAGNOSIS — L309 Dermatitis, unspecified: Secondary | ICD-10-CM

## 2019-12-02 DIAGNOSIS — E1169 Type 2 diabetes mellitus with other specified complication: Secondary | ICD-10-CM

## 2019-12-02 DIAGNOSIS — Z8673 Personal history of transient ischemic attack (TIA), and cerebral infarction without residual deficits: Secondary | ICD-10-CM

## 2019-12-02 DIAGNOSIS — F32A Depression, unspecified: Secondary | ICD-10-CM

## 2019-12-02 DIAGNOSIS — M25511 Pain in right shoulder: Secondary | ICD-10-CM

## 2019-12-02 DIAGNOSIS — E669 Obesity, unspecified: Secondary | ICD-10-CM

## 2019-12-02 DIAGNOSIS — F329 Major depressive disorder, single episode, unspecified: Secondary | ICD-10-CM

## 2019-12-02 MED ORDER — TRAMADOL HCL 50 MG PO TABS: 50.0000 mg | ORAL_TABLET | Freq: Every day | ORAL | 0 refills | Status: DC | PRN

## 2019-12-02 MED ORDER — TRIAMCINOLONE ACETONIDE 0.1 % EX CREA: 1.0000 "application " | TOPICAL_CREAM | Freq: Two times a day (BID) | CUTANEOUS | 0 refills | Status: DC

## 2019-12-02 MED ORDER — TRUE METRIX METER DEVI: 1.0000 | Freq: Four times a day (QID) | 0 refills | Status: DC

## 2019-12-02 MED ORDER — ESCITALOPRAM OXALATE 10 MG PO TABS: 10.0000 mg | ORAL_TABLET | Freq: Every day | ORAL | 4 refills | Status: DC

## 2019-12-02 NOTE — Progress Notes (Signed)
Patient ID: Heather [name], female    DOB: 1955-11-29  MRN: [id number]  CC: Rash   Subjective: Heather [name] is a 64 y.o. female who presents for UC visit Her concerns today include:  Hx of DM, HTN, former smoker tob dep, CVART MCA territory, s/p thrombectomy followed by stenting of M2 by interventional radiology, sz disorder.  C/o rash near LT lower abdominal fold present x 4 mths -does not itch or burn.  -no change in size or color.  No known initiating factor.  RT shoulder pain: she missed appt for MRI stating that she had another appt the same day.   She takes the Tramadol only at nights because this is when the pain is worse.  She still has trouble with movement of the arm. Request RF on Tramadol  Depression: Doing good on  Lexapro.  Feels the depression is under good control for the past 2 months  Hx of CVA:  Lipitor changed to Crestor by the neurologist due to muscle pain from Lipitor.  Doing better on Crestor.  Still on ASA and Brillinta  DM:  Thinks her glucometer no longer works. Gets error reading.    Request new device.  She takes the Amaryl 1 mg daily. Patient Active Problem List   Diagnosis Date Noted  . Seizures ([place]) 03/23/2019  . TIA (transient ischemic attack) 11/18/2018  . Depression 02/21/2018  . Essential hypertension 02/21/2018  . Cerebrovascular accident (CVA) due to embolism of right middle cerebral artery ([place]) 02/21/2018  . Diabetes mellitus type 2 in obese ([place]) 12/20/2017  . Right middle cerebral artery stroke ([place]) 12/19/2017  . Left hemiparesis ([place])   . Dysphagia, oropharyngeal   . 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 acetaminophen (TYLENOL) 500 MG tablet Take 1,000 mg by mouth every 8 (eight) hours as needed for moderate pain or headache.    Marland Kitchen aspirin 81 MG chewable tablet Chew 1 tablet (81 mg total) by mouth daily.     Marland Kitchen BRILINTA 90 MG TABS tablet TAKE 1 TABLET (90 MG TOTAL) BY MOUTH 2 (TWO) TIMES DAILY. 60 tablet 3  . Cholecalciferol (VITAMIN D3 PO) Take 1 tablet by mouth daily.    . citalopram (CELEXA) 10 MG tablet Take 10 mg by mouth daily.    Marland Kitchen escitalopram (LEXAPRO) 10 MG tablet TAKE 1 TABLET BY MOUTH DAILY. 30 tablet 2  . levETIRAcetam (KEPPRA) 500 MG tablet TAKE 1 TABLET (500 MG TOTAL) BY MOUTH 2 (TWO) TIMES DAILY. 60 tablet 5  . rosuvastatin (CRESTOR) 5 MG tablet Take 1 tablet (5 mg total) by mouth daily at 6 PM. 30 tablet 2  . traMADol (ULTRAM) 50 MG tablet TAKE 1 TABLET (50 MG TOTAL) BY MOUTH EVERY 8 (EIGHT) HOURS AS NEEDED. 60 tablet 0  . Blood Glucose Monitoring Suppl (TRUE METRIX METER) DEVI 1 kit by Does not apply route 4 (four) times daily. (Patient not taking: Reported on 12/02/2019) 1 Device 0  . glimepiride (AMARYL) 1 MG tablet TAKE 1 TABLET BY MOUTH DAILY WITH BREAKFAST. (Patient not taking: Reported on 12/02/2019) 30 tablet 2  . glucose blood (TRUE METRIX BLOOD GLUCOSE TEST) test strip Use as instructed (Patient not taking: Reported on 12/02/2019) 100 each 11  . TRUEplus Lancets 28G MISC 28 g by Does not apply route QID. (Patient not taking: Reported on 12/02/2019) 120 each 3   No current  facility-administered medications on file prior to visit.    No Known Allergies  Social History   Socioeconomic History  . Marital status: Single    Spouse name: Not on file  . Number of children: Not on file  . Years of education: Not on file  . Highest education level: Not on file  Occupational History  . Not on file  Tobacco Use  . Smoking status: Former Smoker    Packs/day: 0.50    Years: 30.00    Pack years: 15.00    Types: Cigarettes    Quit date: 12/04/2017    Years since quitting: 1.9  . Smokeless tobacco: Never Used  Substance and Sexual Activity  . Alcohol use: Not Currently    Comment: weekly  . Drug use: No  . Sexual activity: Not Currently    Birth control/protection: None  Other  Topics Concern  . Not on file  Social History Narrative  . Not on file   Social Determinants of Health   Financial Resource Strain:   . Difficulty of Paying Living Expenses: Not on file  Food Insecurity:   . Worried About Charity fundraiser in the Last Year: Not on file  . Ran Out of Food in the Last Year: Not on file  Transportation Needs:   . Lack of Transportation (Medical): Not on file  . Lack of Transportation (Non-Medical): Not on file  Physical Activity:   . Days of Exercise per Week: Not on file  . Minutes of Exercise per Session: Not on file  Stress:   . Feeling of Stress : Not on file  Social Connections:   . Frequency of Communication with Friends and Family: Not on file  . Frequency of Social Gatherings with Friends and Family: Not on file  . Attends Religious Services: Not on file  . Active Member of Clubs or Organizations: Not on file  . Attends Archivist Meetings: Not on file  . Marital Status: Not on file  Intimate Partner Violence:   . Fear of Current or Ex-Partner: Not on file  . Emotionally Abused: Not on file  . Physically Abused: Not on file  . Sexually Abused: Not on file    Family History  Problem Relation Age of Onset  . Diabetes Mother   . Pulmonary disease Mother   . Heart disease Father   . Stroke Sister     Past Surgical History:  Procedure Laterality Date  . BREAST EXCISIONAL BIOPSY    . BREAST SURGERY     right breast  . CHOLECYSTECTOMY    . IR ANGIO INTRA EXTRACRAN SEL COM CAROTID INNOMINATE BILAT MOD SED  03/06/2018  . IR ANGIO INTRA EXTRACRAN SEL COM CAROTID INNOMINATE BILAT MOD SED  11/20/2018  . IR ANGIO INTRA EXTRACRAN SEL COM CAROTID INNOMINATE BILAT MOD SED  10/31/2019  . IR ANGIO VERTEBRAL SEL VERTEBRAL BILAT MOD SED  03/06/2018  . IR ANGIO VERTEBRAL SEL VERTEBRAL BILAT MOD SED  11/20/2018  . IR ANGIO VERTEBRAL SEL VERTEBRAL BILAT MOD SED  10/31/2019  . IR CT HEAD LTD  12/18/2017  . IR INTRA CRAN STENT  12/18/2017  . IR  PERCUTANEOUS ART THROMBECTOMY/INFUSION INTRACRANIAL INC DIAG ANGIO  12/18/2017  . IR RADIOLOGIST EVAL & MGMT  01/31/2018  . IR US GUIDE VASC ACCESS RIGHT  10/31/2019  . RADIOLOGY WITH ANESTHESIA N/A 12/18/2017   Procedure: RADIOLOGY WITH ANESTHESIA;  Surgeon: Luanne Bras, MD;  Location: [place];  Service: Radiology;  Laterality:  N/A;    ROS: Review of Systems Negative except as stated above  PHYSICAL EXAM: BP 106/68 (BP Location: Left Arm, Patient Position: Sitting, Cuff Size: Normal)   Pulse 82   Temp 98 F (36.7 C)   Resp 16   Ht '5\' 6"'  (1.676 m)   Wt 222 lb (100.7 kg)   SpO2 97%   BMI 35.83 kg/m   Physical Exam  General appearance - alert, well appearing, and in no distress Mental status - normal mood, behavior, speech, dress, motor activity, and thought processes Mouth - mucous membranes moist, pharynx normal without lesions Neck - supple, no significant adenopathy Chest - clear to auscultation, no wheezes, rales or rhonchi, symmetric air entry Heart - normal rate, regular rhythm, normal S1, S2, no murmurs, rubs, clicks or gallops Musculoskeletal - RT shoulder:  No point tenderness.  Moderate discomfort with passive ROM in all directions.  Drop arm test is positive. Skin: Mildly erythematous scaly area of about 4 cm on the left lower quadrant of the abdomen bear but not under the abdominal fold CMP Latest Ref Rng & Units 10/31/2019 03/23/2019 03/23/2019  Glucose 70 - 99 mg/dL 100(H) 135(H) 141(H)  BUN 8 - 23 mg/dL '16 13 9  ' Creatinine 0.44 - 1.00 mg/dL 0.30(L) 0.40(L) 0.62  Sodium 135 - 145 mmol/L 143 141 141  Potassium 3.5 - 5.1 mmol/L 4.5 4.8 4.4  Chloride 98 - 111 mmol/L 108 108 109  CO2 22 - 32 mmol/L - - 21(L)  Calcium 8.9 - 10.3 mg/dL - - 9.1  Total Protein 6.5 - 8.1 g/dL - - 6.9  Total Bilirubin 0.3 - 1.2 mg/dL - - 0.7  Alkaline Phos 38 - 126 U/L - - 71  AST 15 - 41 U/L - - 27  ALT 0 - 44 U/L - - 27   Lipid Panel     Component Value Date/Time   CHOL 96  03/24/2019 0611   TRIG 77 03/24/2019 0611   HDL 39 (L) 03/24/2019 0611   CHOLHDL 2.5 03/24/2019 0611   VLDL 15 03/24/2019 0611   LDLCALC 42 03/24/2019 0611    CBC    Component Value Date/Time   WBC 5.5 10/31/2019 0847   RBC 4.62 10/31/2019 0847   HGB 12.9 10/31/2019 1035   HGB 14.1 10/12/2018 1710   HCT 38.0 10/31/2019 1035   HCT 43.0 10/12/2018 1710   PLT 310 10/31/2019 0847   PLT 305 10/12/2018 1710   MCV 90.7 10/31/2019 0847   MCV 91 10/12/2018 1710   MCH 30.5 10/31/2019 0847   MCHC 33.7 10/31/2019 0847   RDW 12.8 10/31/2019 0847   RDW 13.2 10/12/2018 1710   LYMPHSABS 2.1 03/23/2019 1250   MONOABS 0.5 03/23/2019 1250   EOSABS 0.1 03/23/2019 1250   BASOSABS 0.0 03/23/2019 1250   Lab Results  Component Value Date   HGBA1C 5.4 08/23/2019    ASSESSMENT AND PLAN: 1. Chronic right shoulder pain NCCSRS reviewed Controlled substance prescribing agreement initiated today. Advised patient that she has to get the MRI done.  I will have my nurse reschedule this for her. - traMADol (ULTRAM) 50 MG tablet; Take 1 tablet (50 mg total) by mouth daily as needed.  Dispense: 30 tablet; Refill: 0  2. Moderate depressive disorder Controlled on Lexapro - escitalopram (LEXAPRO) 10 MG tablet; Take 1 tablet (10 mg total) by mouth daily.  Dispense: 30 tablet; Refill: 4  3. Dermatitis Of questionable etiology - triamcinolone cream (KENALOG) 0.1 %; Apply 1 application topically  2 (two) times daily.  Dispense: 30 g; Refill: 0  4. History of cardioembolic cerebrovascular accident (CVA) Continue Crestor, aspirin and Brilinta  5. Diabetes mellitus type 2 in obese ([facility]) - Blood Glucose Monitoring Suppl (TRUE METRIX METER) DEVI; 1 kit by Does not apply route 4 (four) times daily.  Dispense: 1 each; Refill: 0   Patient was given the opportunity to ask questions.  Patient verbalized understanding of the plan and was able to repeat key elements of the plan.  [employer] interpreter used during  this encounter. #[id number]  No orders of the defined types were placed in this encounter.    Requested Prescriptions    No prescriptions requested or ordered in this encounter    No follow-ups on file.  Karle Plumber, MD, FACP

## 2019-12-02 NOTE — Progress Notes (Signed)
L side pelvic area rash with itchy sensation X 6 months

## 2019-12-08 ENCOUNTER — Ambulatory Visit (HOSPITAL_COMMUNITY)
Admission: RE | Admit: 2019-12-08 | Discharge: 2019-12-08 | Disposition: A | Discharge status: Home / Self Care | Payer: Self-pay | Source: Ambulatory Visit | Attending: Internal Medicine | Admitting: Internal Medicine

## 2019-12-08 ENCOUNTER — Other Ambulatory Visit: Payer: Self-pay

## 2019-12-08 DIAGNOSIS — M25511 Pain in right shoulder: Secondary | ICD-10-CM | POA: Insufficient documentation

## 2019-12-08 DIAGNOSIS — G8929 Other chronic pain: Secondary | ICD-10-CM | POA: Insufficient documentation

## 2019-12-09 ENCOUNTER — Telehealth: Payer: Self-pay

## 2019-12-09 ENCOUNTER — Other Ambulatory Visit: Payer: Self-pay | Admitting: Internal Medicine

## 2019-12-09 DIAGNOSIS — M778 Other enthesopathies, not elsewhere classified: Secondary | ICD-10-CM

## 2019-12-09 DIAGNOSIS — M7501 Adhesive capsulitis of right shoulder: Secondary | ICD-10-CM

## 2019-12-09 NOTE — Telephone Encounter (Signed)
[employer] interpre[employer] Heather [name]  Id# [id number]  contacted pt to go over MRI results pt is aware and doesn't have any questions or concerns

## 2019-12-17 ENCOUNTER — Encounter: Payer: Self-pay | Admitting: Orthopaedic Surgery

## 2019-12-17 ENCOUNTER — Ambulatory Visit (INDEPENDENT_AMBULATORY_CARE_PROVIDER_SITE_OTHER): Payer: Self-pay | Admitting: Orthopaedic Surgery

## 2019-12-17 ENCOUNTER — Other Ambulatory Visit: Payer: Self-pay

## 2019-12-17 ENCOUNTER — Ambulatory Visit: Payer: Self-pay

## 2019-12-17 DIAGNOSIS — M25511 Pain in right shoulder: Secondary | ICD-10-CM

## 2019-12-17 DIAGNOSIS — G8929 Other chronic pain: Secondary | ICD-10-CM

## 2019-12-17 NOTE — Progress Notes (Signed)
Office Visit Note   Patient: Heather [name]           Date of Birth: 02-Jun-1956           MRN: [id number] Visit Date: 12/17/2019              Requested by: Marcine Matar, MD [address] [place],  [place] [address] PCP: Marcine Matar, MD   Assessment & Plan: Visit Diagnoses:  1. Chronic right shoulder pain     Plan: Impression is chronic right shoulder pain most symptomatic being the [facility] joint.  I referred her to Dr. Prince [place] for an ultrasound-guided cortisone injection to the [facility] joint.  We will start her in physical therapy and an internal prescription has been sent in.  Should she still have pain and limited internal rotation, we will likely have her follow-up for a glenohumeral injection.  This entire visit was conducted with a Spanish-speaking interpreter.  Follow-Up Instructions: Return if symptoms worsen or fail to improve.   Orders:  Orders Placed This Encounter  Procedures  . US Guided Needle Placement  . Ambulatory referral to Physical Therapy   No orders of the defined types were placed in this encounter.     Procedures: No procedures performed   Clinical Data: No additional findings.   Subjective: Chief Complaint  Patient presents with  . Right Shoulder - Pain    HPI patient is a pleasant 64 year old female who presents our clinic today with right shoulder pain.  This has been ongoing for the past 6 months and is progressively worsened.  No known injury or change in activity.  She is status post left CVA with left-sided hemiparesis.  The pain she has is primarily to the superior aspect of the shoulder and occasionally radiates into the deltoid.  Pain is aggravated with opening her trunk, doing her hair and bring her arm across her body.  She has been taking tramadol with mild relief of pain.  This does cause a headache as well.  He denies any numbness, tingling or burning.  No previous injection or surgical intervention to the right  shoulder.  Review of Systems as detailed in HPI.  All others reviewed and are negative.   Objective: Vital Signs: There were no vitals taken for this visit.  Physical Exam well-developed well-nourished female no acute distress.  Alert and oriented x3.  Ortho Exam examination of her right shoulder reveals near full forward flexion.  She does have increased pain with abduction, abduction and internal rotation for which she can only get to her back pocket.  Minimally positive empty can.  Marked tenderness to the [facility] joint.  She does have decreased strength with resisted external rotation.  Specialty Comments:  No specialty comments available.  Imaging: US Guided Needle Placement  Result Date: 12/17/2019 Please see Notes tab for imaging impression.  Previous MRI of the right shoulder from 12/09/2019 shows mild supraspinatus tendinosis without tear.  Moderate AC degenerative changes and mild adhesive capsulitis.  PMFS History: Patient Active Problem List   Diagnosis Date Noted  . Seizures ([facility]) 03/23/2019  . TIA (transient ischemic attack) 11/18/2018  . Depression 02/21/2018  . Essential hypertension 02/21/2018  . Cerebrovascular accident (CVA) due to embolism of right middle cerebral artery ([facility]) 02/21/2018  . Diabetes mellitus type 2 in obese ([facility]) 12/20/2017  . Right middle cerebral artery stroke ([facility]) 12/19/2017  . Left hemiparesis ([facility])   . Dysphagia, oropharyngeal   . Middle cerebral artery  embolism, 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   Past Medical History:  Diagnosis Date  . Depression   . Diabetes ([place])   . Hypertension   . Seizure ([place])   . Stroke [facility])     Family History  Problem Relation Age of Onset  . Diabetes Mother   . Pulmonary disease Mother   . Heart disease Father   . Stroke Sister     Past Surgical History:  Procedure Laterality Date  . BREAST EXCISIONAL BIOPSY    . BREAST SURGERY     right breast  . CHOLECYSTECTOMY    . IR ANGIO INTRA EXTRACRAN SEL COM CAROTID  INNOMINATE BILAT MOD SED  03/06/2018  . IR ANGIO INTRA EXTRACRAN SEL COM CAROTID INNOMINATE BILAT MOD SED  11/20/2018  . IR ANGIO INTRA EXTRACRAN SEL COM CAROTID INNOMINATE BILAT MOD SED  10/31/2019  . IR ANGIO VERTEBRAL SEL VERTEBRAL BILAT MOD SED  03/06/2018  . IR ANGIO VERTEBRAL SEL VERTEBRAL BILAT MOD SED  11/20/2018  . IR ANGIO VERTEBRAL SEL VERTEBRAL BILAT MOD SED  10/31/2019  . IR CT HEAD LTD  12/18/2017  . IR INTRA CRAN STENT  12/18/2017  . IR PERCUTANEOUS ART THROMBECTOMY/INFUSION INTRACRANIAL INC DIAG ANGIO  12/18/2017  . IR RADIOLOGIST EVAL & MGMT  01/31/2018  . IR US GUIDE VASC ACCESS RIGHT  10/31/2019  . RADIOLOGY WITH ANESTHESIA N/A 12/18/2017   Procedure: RADIOLOGY WITH ANESTHESIA;  Surgeon: Luanne Bras, MD;  Location: [place];  Service: Radiology;  Laterality: N/A;   Social History   Occupational History  . Not on file  Tobacco Use  . Smoking status: Former Smoker    Packs/day: 0.50    Years: 30.00    Pack years: 15.00    Types: Cigarettes    Quit date: 12/04/2017    Years since quitting: 2.0  . Smokeless tobacco: Never Used  Substance and Sexual Activity  . Alcohol use: Not Currently    Comment: weekly  . Drug use: No  . Sexual activity: Not Currently    Birth control/protection: None

## 2019-12-17 NOTE — Progress Notes (Signed)
Subjective: Patient is here for ultrasound-guided right AC injection.  Objective:  Tender directly over ACJ.  Procedure: Ultrasound-guided right AC injection: After sterile prep with Betadine, injected 1 cc 1% lidocaine without epinephrine and 40 mg methylprednisolone into the ACJ.  Injectate seen filling joint capsule.  Good immediate relief.

## 2020-01-07 ENCOUNTER — Ambulatory Visit: Payer: No Typology Code available for payment source | Attending: Physician Assistant | Admitting: Physical Therapy

## 2020-01-20 ENCOUNTER — Telehealth: Payer: Self-pay | Admitting: Internal Medicine

## 2020-01-20 NOTE — Telephone Encounter (Signed)
Patient wants to know if she can take over the counter medicine Lipo Flavonoid ear ringing because she feels as though she is unbalances and possible experiencing vertigo. Please have someone call to follow up

## 2020-01-21 NOTE — Telephone Encounter (Signed)
Will forward to pcp

## 2020-01-24 MED ORDER — MECLIZINE HCL 12.5 MG PO TABS: 12.5000 mg | ORAL_TABLET | Freq: Two times a day (BID) | ORAL | 0 refills | Status: DC | PRN

## 2020-01-26 IMAGING — MG DIGITAL SCREENING BILAT W/ TOMO W/ CAD
8 series · 8 of 24 positions shown · non-contrast
Comparison: Previous exam(s).

CLINICAL DATA: Screening.

EXAM:
DIGITAL SCREENING BILATERAL MAMMOGRAM WITH TOMO AND CAD

[L CC synth-2D]
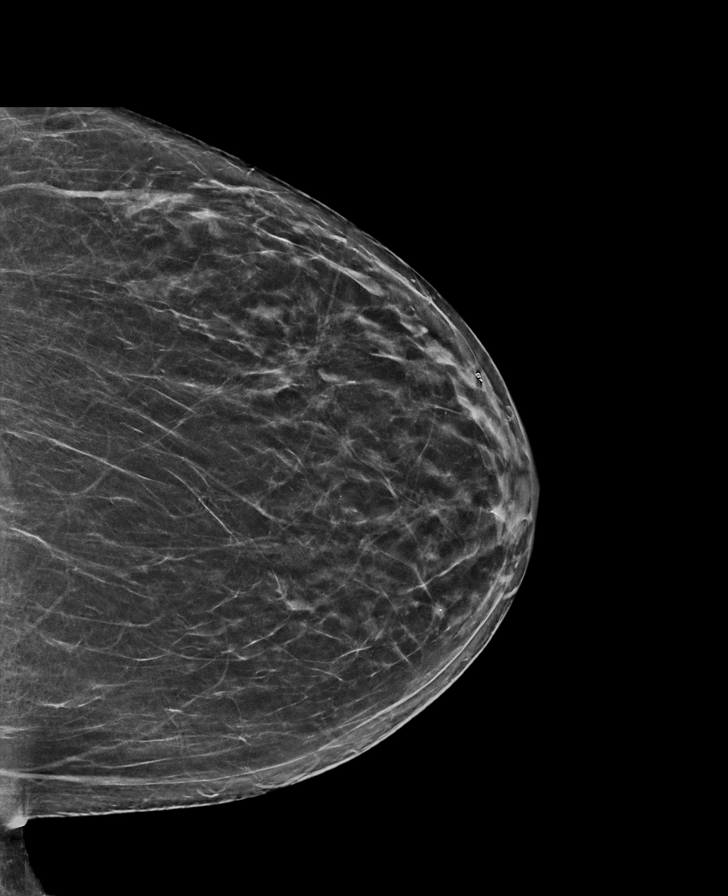

[R CC synth-2D]
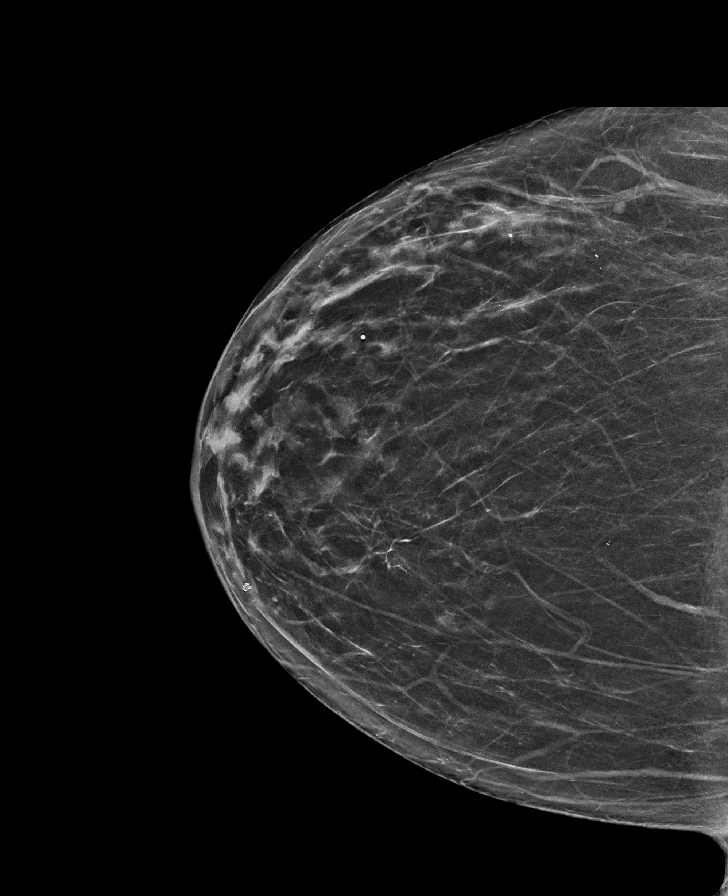

[L MLO synth-2D]
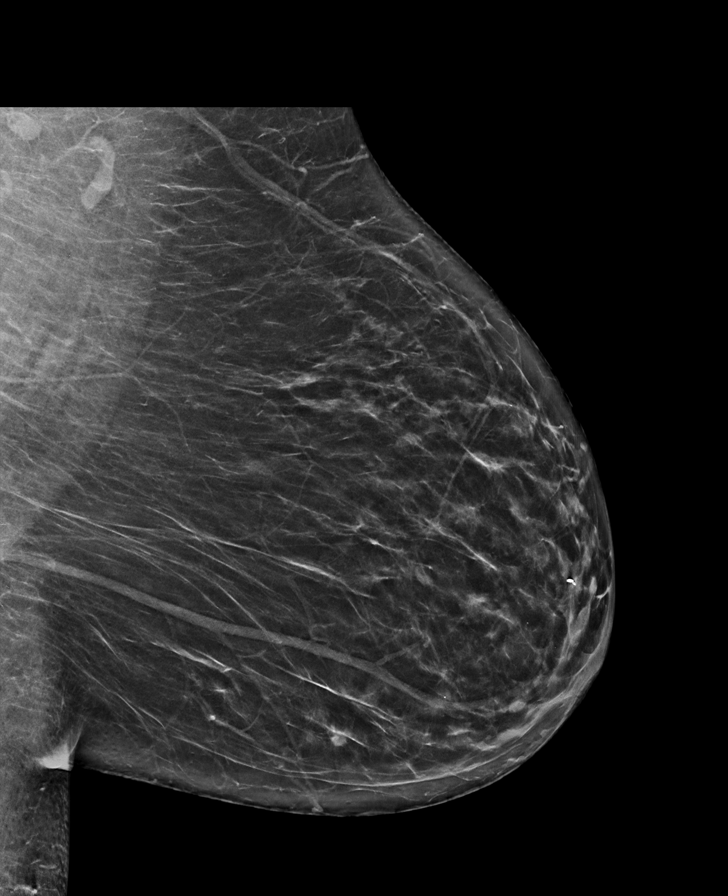

[R MLO synth-2D]
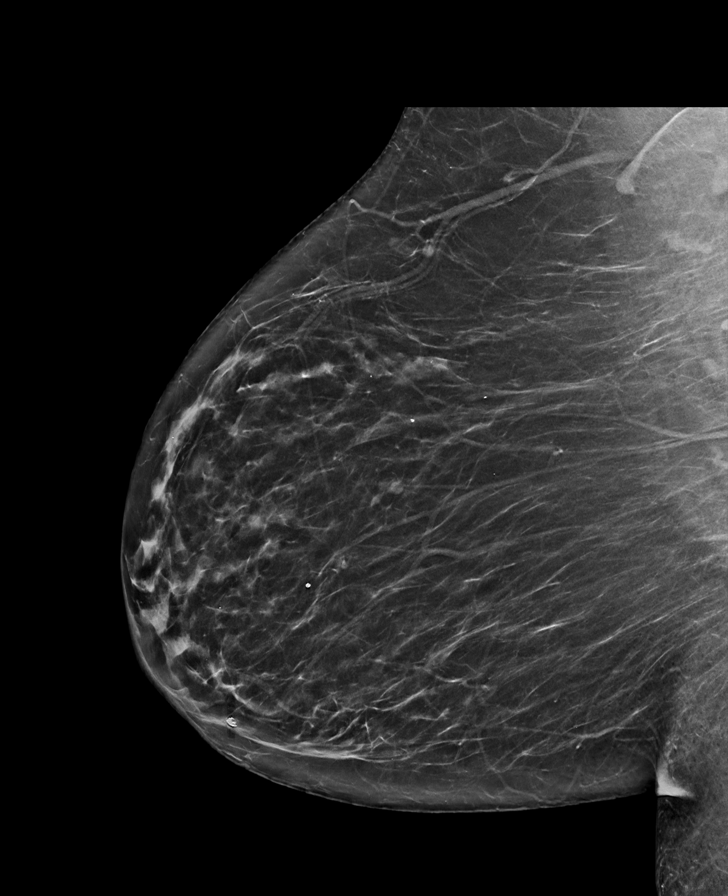

[R MLO tomo · tomo slice 35/68.0]
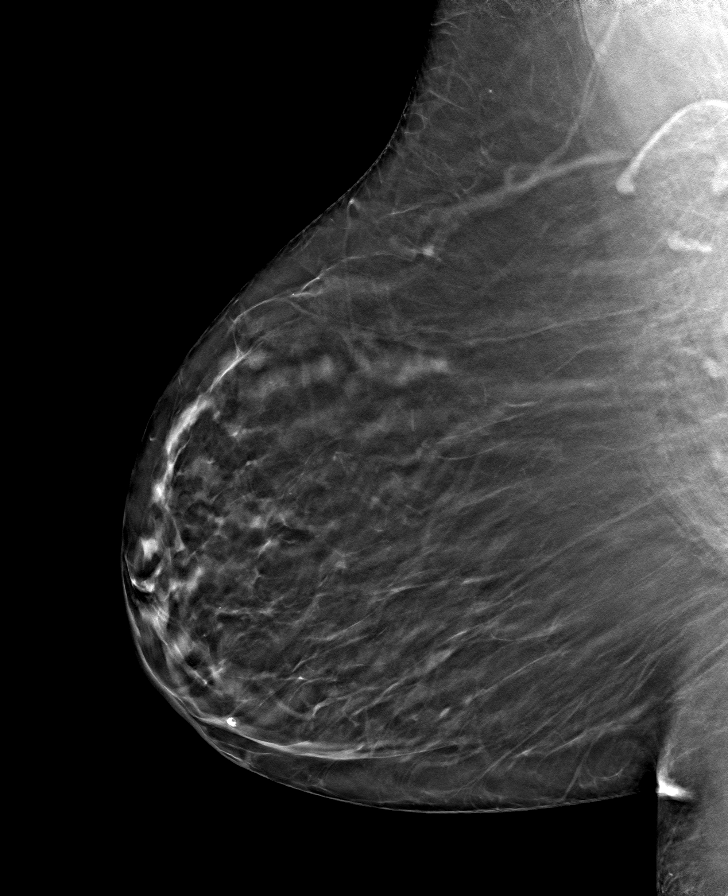

[R CC tomo · tomo slice 32/63.0]
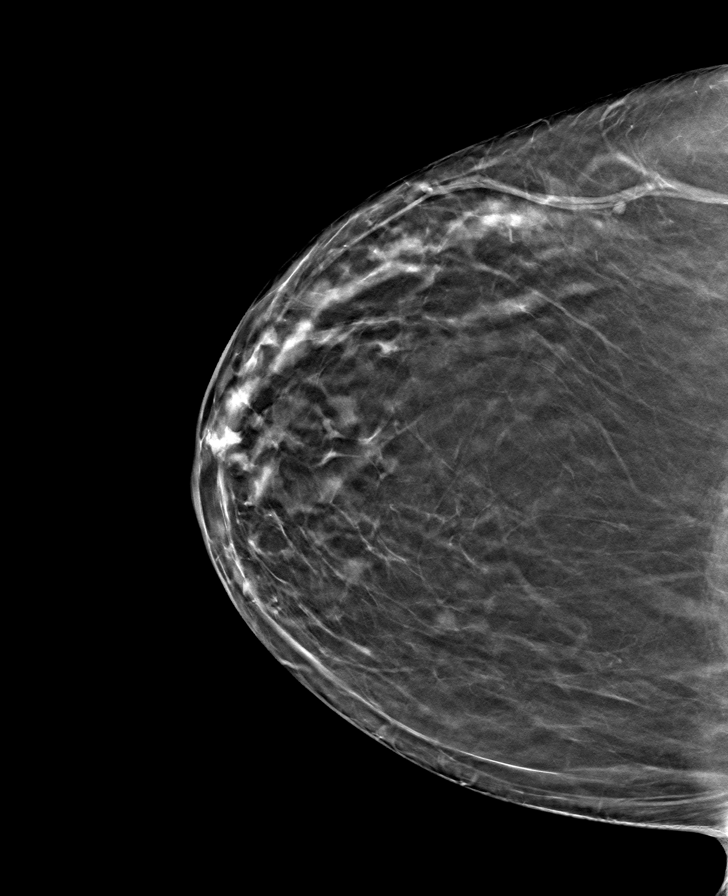

[L CC tomo · tomo slice 31/60.0]
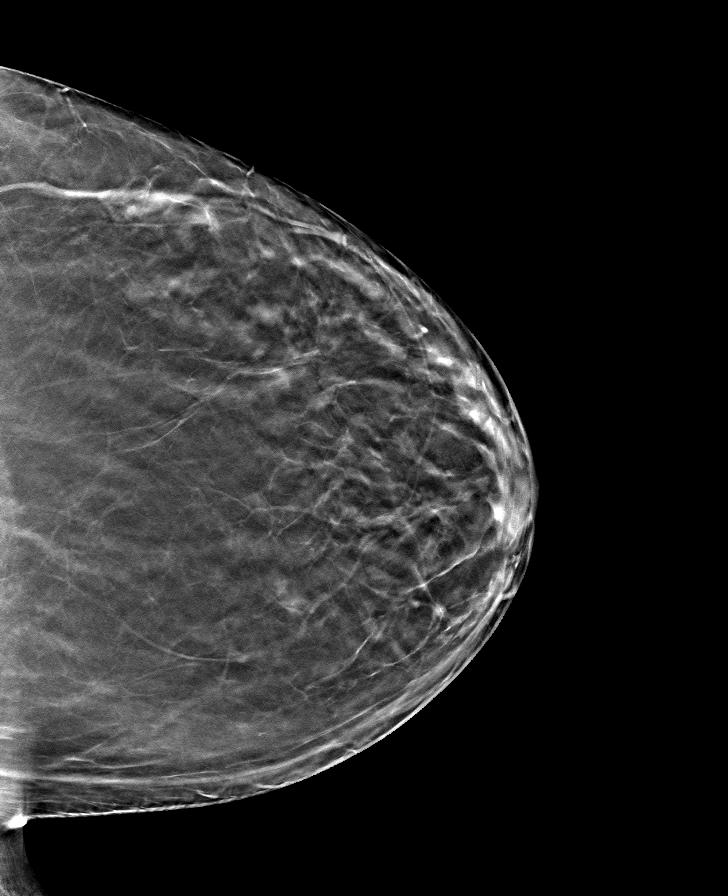

[L MLO tomo · tomo slice 34/67.0]
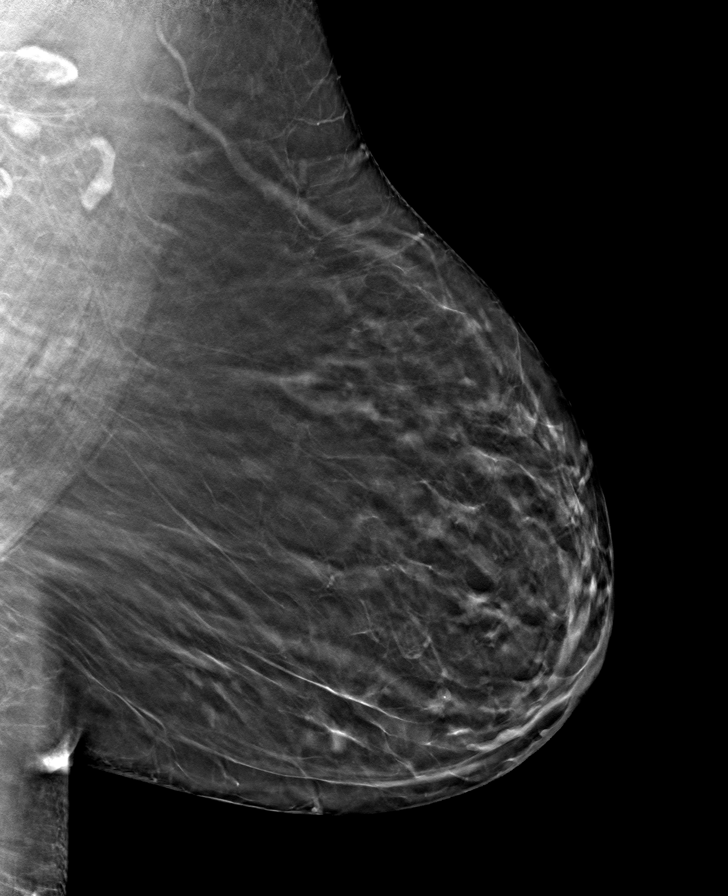

[8 of 24 positions shown; findings below may reference images not displayed]

ACR Breast Density Category b: There are scattered areas of
fibroglandular density.
FINDINGS: There are no findings suspicious for malignancy. Images were
processed with CAD.
IMPRESSION: No mammographic evidence of malignancy. A result letter of this
screening mammogram will be mailed directly to the patient.

RECOMMENDATION:
Screening mammogram in one year. (Code:[id number])

BI-RADS CATEGORY  1: Negative.

## 2020-01-27 NOTE — Telephone Encounter (Signed)
Pt called and aware of medication ready at the pharmacy

## 2020-01-27 NOTE — Telephone Encounter (Signed)
Orpha Bur could you please contact pt

## 2020-02-12 ENCOUNTER — Telehealth: Payer: Self-pay | Admitting: Internal Medicine

## 2020-02-12 NOTE — Telephone Encounter (Signed)
1) Medication(s) Requested (by name): meclizine (ANTIVERT) 12.5 MG tablet   2) Pharmacy of Choice: [facility] pharmacy   3) Special Requests:   Approved medications will be sent to the pharmacy, we will reach out if there is an issue.  Requests made after 3pm may not be addressed until the following business day!  If a patient is unsure of the name of the medication(s) please note and ask patient to call back when they are able to provide all info, do not send to responsible party until all information is available!

## 2020-02-13 ENCOUNTER — Other Ambulatory Visit: Payer: Self-pay | Admitting: Internal Medicine

## 2020-02-13 MED ORDER — MECLIZINE HCL 12.5 MG PO TABS: 12.5000 mg | ORAL_TABLET | Freq: Two times a day (BID) | ORAL | 2 refills | Status: DC | PRN

## 2020-02-14 ENCOUNTER — Other Ambulatory Visit: Payer: Self-pay | Admitting: Internal Medicine

## 2020-02-14 DIAGNOSIS — Z8673 Personal history of transient ischemic attack (TIA), and cerebral infarction without residual deficits: Secondary | ICD-10-CM

## 2020-04-29 ENCOUNTER — Other Ambulatory Visit: Payer: Self-pay | Admitting: Internal Medicine

## 2020-04-29 NOTE — Telephone Encounter (Signed)
Requested medication (s) are due for refill today:  Provider to decide for both medications  Requested medication (s) are on the active medication list:   Yes  Future visit scheduled:   No   Seen 4 months ago   Last ordered: Returned because both of these refills are non delegated.   Requested Prescriptions  Pending Prescriptions Disp Refills   levETIRAcetam (KEPPRA) 500 MG tablet [Pharmacy Med Name: levETIRAcetam 500 MG TABS 500 Tablet] 60 tablet 5    Sig: TAKE 1 TABLET (500 MG TOTAL) BY MOUTH 2 (TWO) TIMES DAILY.      Not Delegated - Neurology:  Anticonvulsants Failed - 04/29/2020  8:38 AM      Failed - This refill cannot be delegated      Passed - HCT in normal range and within 360 days    HCT  Date Value Ref Range Status  10/31/2019 38.0 36 - 46 % Final   Hematocrit  Date Value Ref Range Status  10/12/2018 43.0 34.0 - 46.6 % Final          Passed - HGB in normal range and within 360 days    Hemoglobin  Date Value Ref Range Status  10/31/2019 12.9 12.0 - 15.0 g/dL Final  67/61/9509 32.6 11.1 - 15.9 g/dL Final          Passed - PLT in normal range and within 360 days    Platelets  Date Value Ref Range Status  10/31/2019 310 150 - 400 K/uL Final  10/12/2018 305 150 - 450 x10E3/uL Final          Passed - WBC in normal range and within 360 days    WBC  Date Value Ref Range Status  10/31/2019 5.5 4.0 - 10.5 K/uL Final          Passed - Valid encounter within last 12 months    Recent Outpatient Visits           4 months ago Chronic right shoulder pain   [facility] [place], Gavin Pound B, MD   8 months ago Diabetes mellitus type 2 in obese [facility])   [place] [facility] Jonah Blue B, MD   10 months ago Diabetes mellitus type 2 in obese [facility])   [facility] [place], [place], [place]   11 months ago Anxiety and depression   [place] [address] [facility] [place], Marylene Land M,  [place]   11 months ago Neck pain, acute   [facility] [place], Binnie Rail, MD                meclizine (ANTIVERT) 12.5 MG tablet [Pharmacy Med Name: MECLIZINE 12.5 MG TABL 12.5 Tablet] 30 tablet 2    Sig: Take 1 tablet (12.5 mg total) by mouth 2 (two) times daily as needed for dizziness.      Not Delegated - Gastroenterology: Antiemetics Failed - 04/29/2020  8:38 AM      Failed - This refill cannot be delegated      Passed - Valid encounter within last 6 months    Recent Outpatient Visits           4 months ago Chronic right shoulder pain   [place] [facility] Jonah Blue B, [place]   8 months ago Diabetes mellitus type 2 in obese [facility])   [place] [facility] Marcine Matar, MD   10  months ago Diabetes mellitus type 2 in obese [facility])   [facility] [place], [place], [place]   11 months ago Anxiety and depression   [facility] [place], Marylene Land [place], [place]   11 months ago Neck pain, acute   [facility] Marcine Matar, MD

## 2020-05-07 ENCOUNTER — Ambulatory Visit: Payer: No Typology Code available for payment source | Admitting: Physician Assistant

## 2020-05-12 ENCOUNTER — Other Ambulatory Visit: Payer: Self-pay

## 2020-05-12 ENCOUNTER — Other Ambulatory Visit: Payer: Self-pay | Admitting: Adult Health

## 2020-05-12 ENCOUNTER — Ambulatory Visit (INDEPENDENT_AMBULATORY_CARE_PROVIDER_SITE_OTHER): Payer: Self-pay | Admitting: Adult Health

## 2020-05-12 ENCOUNTER — Encounter: Payer: Self-pay | Admitting: Adult Health

## 2020-05-12 VITALS — BP 115/77 | HR 72 | Ht 68.0 in | Wt 230.0 lb

## 2020-05-12 DIAGNOSIS — E1169 Type 2 diabetes mellitus with other specified complication: Secondary | ICD-10-CM

## 2020-05-12 DIAGNOSIS — I63311 Cerebral infarction due to thrombosis of right middle cerebral artery: Secondary | ICD-10-CM

## 2020-05-12 DIAGNOSIS — E669 Obesity, unspecified: Secondary | ICD-10-CM

## 2020-05-12 DIAGNOSIS — I10 Essential (primary) hypertension: Secondary | ICD-10-CM

## 2020-05-12 DIAGNOSIS — R569 Unspecified convulsions: Secondary | ICD-10-CM

## 2020-05-12 DIAGNOSIS — E785 Hyperlipidemia, unspecified: Secondary | ICD-10-CM

## 2020-05-12 DIAGNOSIS — I69398 Other sequelae of cerebral infarction: Secondary | ICD-10-CM

## 2020-05-12 MED ORDER — LEVETIRACETAM 500 MG PO TABS: 500.0000 mg | ORAL_TABLET | Freq: Two times a day (BID) | ORAL | 3 refills | Status: DC

## 2020-05-12 NOTE — Progress Notes (Signed)
[facility] [address] [place]. [place] [address] [phone]       OFFICE FOLLOW-UP NOTE  [name] Date of Birth:  02/16/1956 Medical Record Number:  [id number]    Chief complaint: Chief Complaint  Patient presents with  . Follow-up    tx rm here for a f/u from a stroke. Pt is not having any new sx.  . treatment room     HPI:   Update 05/12/2020 JM: [name] returns for stroke follow-up.  She is accompanied by interpreter  Stable since prior visit without new stroke/TIA symptoms Continues on aspirin and Brilinta (post stent per IR) with easy bruising but no bleeding Continue on Crestor without myalgias Blood pressure today 115/77  No reoccurring seizure type activity or events Continues on Keppra 500 mg twice daily without side effects  She does report chronic intermittent vertigo and requesting refill of meclizine     Summary of history provided for reference purposes only Update 11/13/2019:  Stable.  Remains on Keppra without seizure activity ?  Atorvastatin side effect therefore Crestor initiated Blood pressure stable Cerebral angiogram 10/31/2019 50 to 70% patency of the stented segment of right MCA and recommended MRA in 6 months time.   Continues on aspirin and Brilinta as instructed by IR without bleeding or bruising.    05/29/2019 update:  New onset seizure 03/23/2019 -ED eval with reported tonic-clonic seizure Likely late effect of stroke Initiated Keppra without reoccurring seizure activity  Update 11/26/2018 :  Single episode of right-sided facial twitching lasting approximately 10 minutes of unknown etiology CTA 50% IntraStent stenosis  Cerebral angiogram 30 to 40% IntraStent restenosis EEG negative MRI no acute abnormality  Update 08/16/2018:  Stable C/o right-sided intermittent neck pain evaluated by PCP Compliant with aspirin and Brilinta  05/16/2018 visit:  Mild diminished fine motor skills  left hand post stroke Patient reported 85% recovery Cerebral angiogram 03/06/2018 50% right MCA IntraStent stenosis  Stroke admission 12/2017: Heather [name] an 64 y.o.femaleHispanic with PMH of HTN,Obesity who presents [name]. Patient is Spanish-speaking and initially EMS was told that her symptoms began at 9 PM after she dropped a glass in her left hand. CT head showed no hemorrhage and tPA was mixed. However after nurse spoke to her in [place], patient stated that she had a headache that began around 6:30pm and had left side weakness as she was outside window period upon completion of CT scan.  CTA head and neck was performed which showed a superior division M2 cutoff. After discussing with family given her borderline NIHSS of 7, however worsening symptoms ( from EMS assessment to arrival at hospital) we decided to take the patient for mechanical thrombectomy. Date last known well:3.17.19Time last known well:around 6.30 pm tPA Given:no, outside window at time of CT scan NIHSS:7 ( at time of arrival) Baseline MRS0  . After written informed consent from the patient and family She was taken to the interventional radiology suite for thrombectomy and right middle cerebral artery  Rescue stent placement performed by Dr. Estanislado Pandy for progressive occlusion of the inferior division of the right middle cerebral artery due to underlying stenosis. She also received 10 mg of superselective into arterial Integrilin. She was given loading dose of 180 mg of Brilinta and aspirin. The patient was initially and in the neurological intensive care unit and closely monitored she did well. MRI scan of the brain showed a moderate-sized right MCA  infarct.Marland Kitchen LDL cholesterol 67 mg percent and women A1c was 6.7. Transthoracic echo showed normal ejection fraction. Patient had initial right gaze preference and left hemiplegia but made gradual improvement. She was seen  by physical occupational and speech therapy. She was transferred to inpatient rehabilitation and has done well.      ROS:   14 system review of systems is positive for joint pain and vertigo and all other systems negative  PMH:  Past Medical History:  Diagnosis Date  . Depression   . Diabetes ([place])   . Hypertension   . Seizure ([place])   . Stroke [facility])     Social History:  Social History   Socioeconomic History  . Marital status: Single    Spouse name: Not on file  . Number of children: Not on file  . Years of education: Not on file  . Highest education level: Not on file  Occupational History  . Not on file  Tobacco Use  . Smoking status: Former Smoker    Packs/day: 0.50    Years: 30.00    Pack years: 15.00    Types: Cigarettes    Quit date: 12/04/2017    Years since quitting: 2.4  . Smokeless tobacco: Never Used  Vaping Use  . Vaping Use: Never used  Substance and Sexual Activity  . Alcohol use: Not Currently    Comment: weekly  . Drug use: No  . Sexual activity: Not Currently    Birth control/protection: None  Other Topics Concern  . Not on file  Social History Narrative  . Not on file   Social Determinants of Health   Financial Resource Strain:   . Difficulty of Paying Living Expenses:   Food Insecurity:   . Worried About Charity fundraiser in the Last Year:   . Arboriculturist in the Last Year:   Transportation Needs:   . Film/video editor (Medical):   Marland Kitchen Lack of Transportation (Non-Medical):   Physical Activity:   . Days of Exercise per Week:   . Minutes of Exercise per Session:   Stress:   . Feeling of Stress :   Social Connections:   . Frequency of Communication with Friends and Family:   . Frequency of Social Gatherings with Friends and Family:   . Attends Religious Services:   . Active Member of Clubs or Organizations:   . Attends Archivist Meetings:   Marland Kitchen Marital Status:   Intimate Partner Violence:   . Fear of Current or  Ex-Partner:   . Emotionally Abused:   Marland Kitchen Physically Abused:   . Sexually Abused:     Medications:   Current Outpatient Medications on File Prior to Visit  Medication Sig Dispense Refill  . acetaminophen (TYLENOL) 500 MG tablet Take 1,000 mg by mouth every 8 (eight) hours as needed for moderate pain or headache.    Marland Kitchen aspirin 81 MG chewable tablet Chew 1 tablet (81 mg total) by mouth daily.    . Blood Glucose Monitoring Suppl (TRUE METRIX METER) DEVI 1 kit by Does not apply route 4 (four) times daily. 1 each 0  . BRILINTA 90 MG TABS tablet TAKE 1 TABLET (90 MG TOTAL) BY MOUTH 2 (TWO) TIMES DAILY. 60 tablet 3  . Cholecalciferol (VITAMIN D3 PO) Take 1 tablet by mouth daily.    Marland Kitchen escitalopram (LEXAPRO) 10 MG tablet Take 1 tablet (10 mg total) by mouth daily. 30 tablet 4  . glimepiride (AMARYL) 1 MG tablet TAKE  1 TABLET BY MOUTH DAILY WITH BREAKFAST. 30 tablet 2  . glucose blood (TRUE METRIX BLOOD GLUCOSE TEST) test strip Use as instructed 100 each 11  . meclizine (ANTIVERT) 12.5 MG tablet TAKE 1 TABLET (12.5 MG TOTAL) BY MOUTH 2 (TWO) TIMES DAILY AS NEEDED FOR DIZZINESS. 30 tablet 0  . rosuvastatin (CRESTOR) 5 MG tablet Take 1 tablet (5 mg total) by mouth daily at 6 PM. 30 tablet 2  . TRUEplus Lancets 28G MISC 28 g by Does not apply route QID. 120 each 3   No current facility-administered medications on file prior to visit.    Allergies:  No Known Allergies  Physical Exam  Today's Vitals   05/12/20 0849  BP: 115/77  Pulse: 72  Weight: 230 lb (104.3 kg)  Height: '5\' 8"'  (1.727 m)   Body mass index is 34.97 kg/m.   General: Well-nourished, well-developed pleasant middle-age  Hispanic Spanish-speaking lady, seated, in no evident distress Neck: supple with no carotid or supraclavicular bruits Cardiovascular: regular rate and rhythm, no murmurs Vascular:  Normal pulses all extremities  Neurologic Exam Mental Status: Awake and fully alert.  Primary Spanish speaking.  Denies speech or  language difficulty.  Oriented to place and time. Recent and remote memory intact. Attention span, concentration and fund of knowledge appropriate. Mood and affect appropriate.  Cranial Nerves: Pupils equal, briskly reactive to light. Extraocular movements full without nystagmus. Visual fields full to confrontation. Hearing intact. Facial sensation intact. Face, tongue, palate moves normally and symmetrically.  Motor: Normal bulk and tone. Normal strength in all tested extremity muscles.  Sensory.: intact to touch ,pinprick .position and vibratory sensation.  Coordination: Rapid alternating movements normal in all extremities. Finger-to-nose and heel-to-shin performed accurately bilaterally. Gait and Station: Arises from chair without difficulty. Stance is normal. Gait demonstrates normal stride length and balance . Able to heel, toe and tandem walk without difficulty.  Reflexes: 1+ and symmetric. Toes downgoing.      ASSESSMENT: 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 Vascular risk factors of intracranial stenosis, diabetes, hypertension and hyperlipidemia.  Prior hospitalization 11/18/2018 with transient facial twitchings of unclear etiology. Presented to ED on 03/23/2019 with tonic-clonic seizure activity with Keppra initiated.       1.  Right MCA infract -Recovered without residual deficits - Continue aspirin 81 mg and Brilinta for secondary stroke prevention as well as Crestor 5 mg daily.  -Close PCP follow-up for aggressive stroke risk factor management  2.  Seizure post stroke -No recurrent seizure type activity or symptoms -Continue taking Keppra 500 mg BID for seizure prophylaxis -Refill provided  3.  Intercranial stenosis -Cerebral angiogram 10/2019 right MCA 50-70% patency of the stented segment -Ongoing use of Brilinta  prescribed and managed by IR -Advised to contact Dr. Arlean Hopping office to schedule follow-up imaging as recommended 81-monthfollow-up in 10/2019.   4.  HTN -BP goal<130/90 -Stable today -Managed by PCP  5.  HLD -LDL goal <70  - will check lipid panel today -Continue Crestor 5 mg daily - management prescribed by PCP  6.  DM -A1c goal<7 -Managed by PCP  7.  Vertigo, chronic -Prior meclizine refilled by PCP -advised to contact PCP for additional refills    Follow-up in 1 year or call earlier if needed   I spent 30 minutes of face-to-face and non-face-to-face time with patient assisted by interpreter.  This included previsit chart review, lab review, study review, order entry, electronic health record documentation, patient education regarding history of right MCA stroke, post stroke seizures, intracranial stenosis, importance of managing stroke risk factors and answered all questions to patient satisfaction    Frann Rider, [facility]  [facility] [address] [place], [place] [address]  Phone [phone] Fax [phone] Note: This document was prepared with digital dictation and possible smart phrase technology. Any transcriptional errors that result from this process are unintentional.

## 2020-05-12 NOTE — Patient Instructions (Addendum)
Please call Dr. Fatima Sanger office to schedule follow-up for repeat imaging [facility] [address] [place], [place] [address] [phone]  Continue aspirin 81 mg daily and Brilinta (ticagrelor) 90 mg bid  and Crestor for secondary stroke prevention  We will check your cholesterol levels today - we will call you with abnormal levels  Please continue to follow with your PCP for vertigo and meclizine refill  Continue Keppra 500 mg twice daily for seizure prophylaxis  Continue to follow up with PCP regarding cholesterol and blood pressure management  Maintain strict control of hypertension with blood pressure goal below 130/90 and cholesterol with LDL cholesterol (bad cholesterol) goal below 70 mg/dL.       Followup in the future with me in 1 year or call earlier if needed       Thank you for coming to see [place] at [facility]. I hope we have been able to provide you high quality care today.  You may receive a patient satisfaction survey over the next few weeks. We would appreciate your feedback and comments so that we may continue to improve ourselves and the health of our patients.

## 2020-05-12 NOTE — Progress Notes (Signed)
I agree with the above plan 

## 2020-05-13 LAB — LIPID PANEL
Chol/HDL Ratio: 1.9 ratio (ref 0.0–4.4)
Cholesterol, Total: 103 mg/dL (ref 100–199)
HDL: 54 mg/dL (ref >39)
LDL Chol Calc (NIH): 36 mg/dL (ref 0–99)
Triglycerides: 53 mg/dL (ref 0–149)
VLDL Cholesterol Cal: 13 mg/dL (ref 5–40)

## 2020-05-26 ENCOUNTER — Other Ambulatory Visit: Payer: Self-pay | Admitting: Internal Medicine

## 2020-05-26 NOTE — Telephone Encounter (Signed)
Requested medication (s) are due for refill today: no  Requested medication (s) are on the active medication list: yes  Last refill:  03/30/2020  Future visit scheduled: no  Notes to clinic: this refill cannot be delegated    Requested Prescriptions  Pending Prescriptions Disp Refills   meclizine (ANTIVERT) 12.5 MG tablet [Pharmacy Med Name: MECLIZINE 12.5 MG TABL 12.5 Tablet] 30 tablet 2    Sig: TAKE 1 TABLET (12.5 MG TOTAL) BY MOUTH 2 (TWO) TIMES DAILY AS NEEDED FOR DIZZINESS.      Not Delegated - Gastroenterology: Antiemetics Failed - 05/26/2020  7:17 AM      Failed - This refill cannot be delegated      Passed - Valid encounter within last 6 months    Recent Outpatient Visits           5 months ago Chronic right shoulder pain   [place] Jonah Blue B, MD   9 months ago Diabetes mellitus type 2 in obese [facility])   [place] [facility] Marcine Matar, MD   11 months ago Diabetes mellitus type 2 in obese [facility])   [facility] [place], Marylene Land M, [place]   12 months ago Anxiety and depression   [facility] [place], [place], [place]   1 year ago Neck pain, acute   [facility] Marcine Matar, MD               Signed Prescriptions Disp Refills   glimepiride (AMARYL) 1 MG tablet 30 tablet 2    Sig: TAKE 1 TABLET BY MOUTH DAILY WITH BREAKFAST.      Endocrinology:  Diabetes - Sulfonylureas Failed - 05/26/2020  7:17 AM      Failed - HBA1C is between 0 and 7.9 and within 180 days    HbA1c, POC (prediabetic range)  Date Value Ref Range Status  02/20/2018 6.3 5.7 - 6.4 % Final   HbA1c POC (<> result, manual entry)  Date Value Ref Range Status  05/08/2018 5.5 4.0 - 5.6 % Final   Hgb A1c MFr Bld  Date Value Ref Range Status  08/23/2019 5.4 4.8 - 5.6 % Final    Comment:             Prediabetes: 5.7 - 6.4          Diabetes:  >6.4          Glycemic control for adults with diabetes: <7.0           Passed - Valid encounter within last 6 months    Recent Outpatient Visits           5 months ago Chronic right shoulder pain   [facility] Jonah Blue B, MD   9 months ago Diabetes mellitus type 2 in obese [facility])   [place] [facility] Marcine Matar, MD   11 months ago Diabetes mellitus type 2 in obese [facility])   [facility] [place], [place], [place]   12 months ago Anxiety and depression   [facility] [address] [facility] [place], [place], [place]   1 year ago Neck pain, acute   [facility] Marcine Matar, [place]

## 2020-05-29 ENCOUNTER — Telehealth: Payer: Self-pay | Admitting: Internal Medicine

## 2020-05-29 DIAGNOSIS — Z8673 Personal history of transient ischemic attack (TIA), and cerebral infarction without residual deficits: Secondary | ICD-10-CM

## 2020-05-29 NOTE — Telephone Encounter (Signed)
Could you refill medication

## 2020-05-29 NOTE — Telephone Encounter (Signed)
Medication: BRILINTA 90 MG TABS tablet [[id number]]   Has the patient contacted their pharmacy? Yes (Agent: If no, request that the patient contact the pharmacy for the refill.) (Agent: If yes, when and what did the pharmacy advise?)  Preferred Pharmacy (with phone number or street name): [facility] [place], [place] - [place] [address]  Phone:  [phone] Fax:  [phone]     Agent: Please be advised that RX refills may take up to 3 business days. We ask that you follow-up with your pharmacy.

## 2020-06-01 MED ORDER — TICAGRELOR 90 MG PO TABS: ORAL_TABLET | ORAL | 2 refills | Status: DC

## 2020-06-01 NOTE — Telephone Encounter (Signed)
Rx sent 

## 2020-06-02 ENCOUNTER — Other Ambulatory Visit: Payer: Self-pay

## 2020-06-02 ENCOUNTER — Ambulatory Visit: Payer: Self-pay | Attending: Family Medicine

## 2020-06-10 ENCOUNTER — Other Ambulatory Visit: Payer: Self-pay

## 2020-06-10 ENCOUNTER — Ambulatory Visit: Payer: No Typology Code available for payment source

## 2020-06-16 ENCOUNTER — Ambulatory Visit: Payer: Self-pay | Attending: Internal Medicine | Admitting: Internal Medicine

## 2020-06-16 ENCOUNTER — Other Ambulatory Visit: Payer: Self-pay | Admitting: Internal Medicine

## 2020-06-16 ENCOUNTER — Telehealth: Payer: Self-pay | Admitting: Internal Medicine

## 2020-06-16 ENCOUNTER — Ambulatory Visit (HOSPITAL_BASED_OUTPATIENT_CLINIC_OR_DEPARTMENT_OTHER): Payer: No Typology Code available for payment source | Admitting: Pharmacist

## 2020-06-16 ENCOUNTER — Encounter: Payer: Self-pay | Admitting: Internal Medicine

## 2020-06-16 ENCOUNTER — Other Ambulatory Visit: Payer: Self-pay

## 2020-06-16 VITALS — BP 107/75 | HR 61 | Temp 98.1°F | Resp 16 | Wt 231.0 lb

## 2020-06-16 DIAGNOSIS — Z8673 Personal history of transient ischemic attack (TIA), and cerebral infarction without residual deficits: Secondary | ICD-10-CM

## 2020-06-16 DIAGNOSIS — I669 Occlusion and stenosis of unspecified cerebral artery: Secondary | ICD-10-CM | POA: Insufficient documentation

## 2020-06-16 DIAGNOSIS — E1169 Type 2 diabetes mellitus with other specified complication: Secondary | ICD-10-CM

## 2020-06-16 DIAGNOSIS — E669 Obesity, unspecified: Secondary | ICD-10-CM

## 2020-06-16 DIAGNOSIS — Z23 Encounter for immunization: Secondary | ICD-10-CM

## 2020-06-16 DIAGNOSIS — M25662 Stiffness of left knee, not elsewhere classified: Secondary | ICD-10-CM

## 2020-06-16 DIAGNOSIS — Z6835 Body mass index (BMI) 35.0-35.9, adult: Secondary | ICD-10-CM | POA: Insufficient documentation

## 2020-06-16 DIAGNOSIS — G40909 Epilepsy, unspecified, not intractable, without status epilepticus: Secondary | ICD-10-CM

## 2020-06-16 DIAGNOSIS — F325 Major depressive disorder, single episode, in full remission: Secondary | ICD-10-CM

## 2020-06-16 DIAGNOSIS — Z1211 Encounter for screening for malignant neoplasm of colon: Secondary | ICD-10-CM

## 2020-06-16 DIAGNOSIS — M25661 Stiffness of right knee, not elsewhere classified: Secondary | ICD-10-CM

## 2020-06-16 LAB — POCT GLYCOSYLATED HEMOGLOBIN (HGB A1C): Hemoglobin A1C: 5.2 % (ref 4.0–5.6)

## 2020-06-16 MED ORDER — DICLOFENAC SODIUM 1 % EX GEL: 2.0000 g | Freq: Four times a day (QID) | CUTANEOUS | 1 refills | Status: DC

## 2020-06-16 MED ORDER — TRUE METRIX BLOOD GLUCOSE TEST VI STRP: ORAL_STRIP | 11 refills | Status: DC

## 2020-06-16 NOTE — Progress Notes (Signed)
Patient presents for vaccination against influenza per orders of Dr. Johnson. Consent given. Counseling provided. No contraindications exists. Vaccine administered without incident.  ° °Luke Van Ausdall, PharmD, CPP °Clinical Pharmacist °[facility] °[phone] ° °

## 2020-06-16 NOTE — Telephone Encounter (Signed)
-----   Message from Micki Riley, MD sent at 06/16/2020  5:00 PM EDT ----- Since is nearly 6 months since her intracranial stent it may be okay to switch to single agent now.  If she can afford the Brilinta she can stay on it otherwise she will have to switch to aspirin. ----- Message ----- From: Marcine Matar, MD Sent: 06/16/2020  12:58 PM EDT To: Micki Riley, MD, Ihor Austin, NP  I am the PCP for this patient.  I saw her today in routine follow-up visit.  She is on aspirin and Brilinta.  She is having some bruising on both thigh.  I wanted to find out whether it was necessary for her to continue on DAPT or could she just continue the Brilinta?

## 2020-06-16 NOTE — Progress Notes (Signed)
Patient ID: Heather [name], female    DOB: January 11, 1956  MRN: [id number]  CC: No chief complaint on file.   Subjective: Heather [name] is a 64 y.o. female who presents for chronic ds managemnt. Her concerns today include:  Hx of DM, HTN, HL,former smoker tob dep, CVART MCA territory, s/p thrombectomy followed by stenting of M2 by interventional radiology, sz disorder, depression.  Hx of CVA:  Saw neurologist last mth. Stable on ASA and Brilinta.   Some bruises when she bumps against something and some spontaneous.  But no bleeding Needs f/u appt with IR for f/u monitoring of stent placed to MCA.   SZ:  Compliant with Keppra.  No No sz since we last spoke in 12/2019.   Depression:  Doing well on Lexapro.  She feels her depression is in remission on the medication.Marland Kitchen  DM/Obesity DIABETES TYPE 2 Last A1C:   Results for orders placed or performed in visit on 06/16/20  POCT glycosylated hemoglobin (Hb A1C)  Result Value Ref Range   Hemoglobin A1C 5.2 4.0 - 5.6 %   HbA1c POC (<> result, manual entry)     HbA1c, POC (prediabetic range)     HbA1c, POC (controlled diabetic range)      Med Adherence:  '[x]'  Yes on Amaryl 1 mg Medication side effects:  '[]'  Yes    '[x]'  No Home Monitoring?  '[x]'  Yes - daily in the mornings but currently out of stripes Home glucose results range: 103-120 Diet Adherence: '[x]'  Yes    '[]'  No Exercise: '[x]'  Yes -walks 20 mins daily.  She is discouraged that she is not losing weight.  She tries to eat healthy. Hypoglycemic episodes?: '[]'  Yes    '[x]'  No Numbness of the feet? '[]'  Yes    '[x]'  No Retinopathy hx? '[]'  Yes    '[]'  No Last eye exam: over due for eye exam but no insurance.  Comments:  C/o stiffness in knees for over 1 yr. pain at times but more stiffness.  She also feels that the knee joints are disfigured.  She has not had any falls..   HM;  Due for flu vaccine.  Completed COVID-19 vaccine but does not have card with her.  Due for colon CA  screening.  We discussed methods of colon cancer screening.  She prefers to have the fit test.   Patient Active Problem List   Diagnosis Date Noted  . Seizures ([place]) 03/23/2019  . TIA (transient ischemic attack) 11/18/2018  . Depression 02/21/2018  . Essential hypertension 02/21/2018  . Cerebrovascular accident (CVA) due to embolism of right middle cerebral artery ([place]) 02/21/2018  . Diabetes mellitus type 2 in obese ([place]) 12/20/2017  . Right middle cerebral artery stroke ([place]) 12/19/2017  . Left hemiparesis ([place])   . Dysphagia, oropharyngeal   . 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 acetaminophen (TYLENOL) 500 MG tablet Take 1,000 mg by mouth every 8 (eight) hours as needed for moderate pain or headache.    Marland Kitchen aspirin 81 MG chewable tablet Chew 1 tablet (81 mg total) by mouth daily.    . Blood Glucose Monitoring Suppl (TRUE METRIX METER) DEVI 1 kit by Does not apply route 4 (four) times daily. 1 each 0  . Cholecalciferol (VITAMIN D3 PO) Take 1 tablet by mouth daily.    Marland Kitchen escitalopram (LEXAPRO) 10 MG tablet Take 1 tablet (10  mg total) by mouth daily. 30 tablet 4  . glimepiride (AMARYL) 1 MG tablet TAKE 1 TABLET BY MOUTH DAILY WITH BREAKFAST. 30 tablet 2  . levETIRAcetam (KEPPRA) 500 MG tablet Take 1 tablet (500 mg total) by mouth 2 (two) times daily. 180 tablet 3  . meclizine (ANTIVERT) 12.5 MG tablet TAKE 1 TABLET (12.5 MG TOTAL) BY MOUTH 2 (TWO) TIMES DAILY AS NEEDED FOR DIZZINESS. 30 tablet 2  . rosuvastatin (CRESTOR) 5 MG tablet Take 1 tablet (5 mg total) by mouth daily at 6 PM. 30 tablet 2  . ticagrelor (BRILINTA) 90 MG TABS tablet TAKE 1 TABLET (90 MG TOTAL) BY MOUTH 2 (TWO) TIMES DAILY. 60 tablet 2  . TRUEplus Lancets 28G MISC 28 g by Does not apply route QID. 120 each 3   No current facility-administered medications on file prior to visit.    No Known Allergies  Social  History   Socioeconomic History  . Marital status: Single    Spouse name: Not on file  . Number of children: Not on file  . Years of education: Not on file  . Highest education level: Not on file  Occupational History  . Not on file  Tobacco Use  . Smoking status: Former Smoker    Packs/day: 0.50    Years: 30.00    Pack years: 15.00    Types: Cigarettes    Quit date: 12/04/2017    Years since quitting: 2.5  . Smokeless tobacco: Never Used  Vaping Use  . Vaping Use: Never used  Substance and Sexual Activity  . Alcohol use: Not Currently    Comment: weekly  . Drug use: No  . Sexual activity: Not Currently    Birth control/protection: None  Other Topics Concern  . Not on file  Social History Narrative  . Not on file   Social Determinants of Health   Financial Resource Strain:   . Difficulty of Paying Living Expenses: Not on file  Food Insecurity:   . Worried About Charity fundraiser in the Last Year: Not on file  . Ran Out of Food in the Last Year: Not on file  Transportation Needs:   . Lack of Transportation (Medical): Not on file  . Lack of Transportation (Non-Medical): Not on file  Physical Activity:   . Days of Exercise per Week: Not on file  . Minutes of Exercise per Session: Not on file  Stress:   . Feeling of Stress : Not on file  Social Connections:   . Frequency of Communication with Friends and Family: Not on file  . Frequency of Social Gatherings with Friends and Family: Not on file  . Attends Religious Services: Not on file  . Active Member of Clubs or Organizations: Not on file  . Attends Archivist Meetings: Not on file  . Marital Status: Not on file  Intimate Partner Violence:   . Fear of Current or Ex-Partner: Not on file  . Emotionally Abused: Not on file  . Physically Abused: Not on file  . Sexually Abused: Not on file    Family History  Problem Relation Age of Onset  . Diabetes Mother   . Pulmonary disease Mother   . Heart  disease Father   . Stroke Sister     Past Surgical History:  Procedure Laterality Date  . BREAST EXCISIONAL BIOPSY    . BREAST SURGERY     right breast  . CHOLECYSTECTOMY    . IR ANGIO INTRA EXTRACRAN  SEL COM CAROTID INNOMINATE BILAT MOD SED  03/06/2018  . IR ANGIO INTRA EXTRACRAN SEL COM CAROTID INNOMINATE BILAT MOD SED  11/20/2018  . IR ANGIO INTRA EXTRACRAN SEL COM CAROTID INNOMINATE BILAT MOD SED  10/31/2019  . IR ANGIO VERTEBRAL SEL VERTEBRAL BILAT MOD SED  03/06/2018  . IR ANGIO VERTEBRAL SEL VERTEBRAL BILAT MOD SED  11/20/2018  . IR ANGIO VERTEBRAL SEL VERTEBRAL BILAT MOD SED  10/31/2019  . IR CT HEAD LTD  12/18/2017  . IR INTRA CRAN STENT  12/18/2017  . IR PERCUTANEOUS ART THROMBECTOMY/INFUSION INTRACRANIAL INC DIAG ANGIO  12/18/2017  . IR RADIOLOGIST EVAL & MGMT  01/31/2018  . IR US GUIDE VASC ACCESS RIGHT  10/31/2019  . RADIOLOGY WITH ANESTHESIA N/A 12/18/2017   Procedure: RADIOLOGY WITH ANESTHESIA;  Surgeon: Luanne Bras, MD;  Location: [facility];  Service: Radiology;  Laterality: N/A;    ROS: Review of Systems Negative except as stated above  PHYSICAL EXAM: BP 107/75   Pulse 61   Temp 98.1 F (36.7 C)   Resp 16   Wt 231 lb (104.8 kg)   SpO2 98%   BMI 35.12 kg/m   Wt Readings from Last 3 Encounters:  06/16/20 231 lb (104.8 kg)  05/12/20 230 lb (104.3 kg)  12/02/19 222 lb (100.7 kg)    Physical Exam  General appearance - alert, well appearing, and in no distress Mental status - normal mood, behavior, speech, dress, motor activity, and thought processes Neck - supple, no significant adenopathy Chest - clear to auscultation, no wheezes, rales or rhonchi, symmetric air entry Heart - normal rate, regular rhythm, normal S1, S2, no murmurs, rubs, clicks or gallops Musculoskeletal -knees: Patient with large body habitus and a lot of excess fatty around the knee joint.  Knee joints are slightly enlarged.  She has mild crepitus on passive range of motion.  No discomfort with  passive range of motion. Extremities -no lower extremity edema. Skin: She has several small areas of ecchymosis on both thighs most of them about 2 cm in size. Diabetic Foot Exam - Simple   Simple Foot Form Visual Inspection No deformities, no ulcerations, no other skin breakdown bilaterally: Yes Sensation Testing Intact to touch and monofilament testing bilaterally: Yes Pulse Check Posterior Tibialis and Dorsalis pulse intact bilaterally: Yes Comments     Depression screen [facility] 2/9 06/16/2020 12/02/2019 08/23/2019  Decreased Interest 0 0 0  Down, Depressed, Hopeless 0 0 0  PHQ - 2 Score 0 0 0  Altered sleeping - 0 -  Tired, decreased energy - 0 -  Change in appetite - 0 -  Feeling bad or failure about yourself  - 0 -  Trouble concentrating - 0 -  Moving slowly or fidgety/restless - 1 -  Suicidal thoughts - 0 -  PHQ-9 Score - 1 -  Difficult doing work/chores - - -     CMP Latest Ref Rng & Units 10/31/2019 03/23/2019 03/23/2019  Glucose 70 - 99 mg/dL 100(H) 135(H) 141(H)  BUN 8 - 23 mg/dL '16 13 9  ' Creatinine 0.44 - 1.00 mg/dL 0.30(L) 0.40(L) 0.62  Sodium 135 - 145 mmol/L 143 141 141  Potassium 3.5 - 5.1 mmol/L 4.5 4.8 4.4  Chloride 98 - 111 mmol/L 108 108 109  CO2 22 - 32 mmol/L - - 21(L)  Calcium 8.9 - 10.3 mg/dL - - 9.1  Total Protein 6.5 - 8.1 g/dL - - 6.9  Total Bilirubin 0.3 - 1.2 mg/dL - - 0.7  Alkaline Phos  38 - 126 U/L - - 71  AST 15 - 41 U/L - - 27  ALT 0 - 44 U/L - - 27   Lipid Panel     Component Value Date/Time   CHOL 103 05/12/2020 0919   TRIG 53 05/12/2020 0919   HDL 54 05/12/2020 0919   CHOLHDL 1.9 05/12/2020 0919   CHOLHDL 2.5 03/24/2019 0611   VLDL 15 03/24/2019 0611   LDLCALC 36 05/12/2020 0919    CBC    Component Value Date/Time   WBC 5.5 10/31/2019 0847   RBC 4.62 10/31/2019 0847   HGB 12.9 10/31/2019 1035   HGB 14.1 10/12/2018 1710   HCT 38.0 10/31/2019 1035   HCT 43.0 10/12/2018 1710   PLT 310 10/31/2019 0847   PLT 305 10/12/2018 1710     MCV 90.7 10/31/2019 0847   MCV 91 10/12/2018 1710   MCH 30.5 10/31/2019 0847   MCHC 33.7 10/31/2019 0847   RDW 12.8 10/31/2019 0847   RDW 13.2 10/12/2018 1710   LYMPHSABS 2.1 03/23/2019 1250   MONOABS 0.5 03/23/2019 1250   EOSABS 0.1 03/23/2019 1250   BASOSABS 0.0 03/23/2019 1250    ASSESSMENT AND PLAN: 1. Diabetes mellitus type 2 in obese (HCC) Controlled on low-dose Amaryl without episodes of hypoglycemia.  She will continue the medication, healthy eating habits and regular exercise. Encouraged her to get her eye exam done when she is able to afford. - POCT glycosylated hemoglobin (Hb A1C) - glucose blood (TRUE METRIX BLOOD GLUCOSE TEST) test strip; Use as instructed  Dispense: 100 each; Refill: 11 - CBC - Comprehensive metabolic panel  2. Class 2 severe obesity with serious comorbidity and body mass index (BMI) of 35.0 to 35.9 in adult, unspecified obesity type ([place]) Continue healthy eating habits and regular exercise.  She is agreeable to seeing the nutritionist to make sure that she is doing as well as she can with her eating habits to help achieve some weight loss - Amb ref to Medical Nutrition Therapy-MNT  3. Knee joint stiffness, bilateral Likely early arthritis. - diclofenac Sodium (VOLTAREN) 1 % GEL; Apply 2 g topically 4 (four) times daily.  Dispense: 100 g; Refill: 1  4. Major depression in remission ([place]) In remission on Lexapro.  5. History of cardioembolic cerebrovascular accident (CVA) 6. Stenosis of intracranial vessel She has updated her orange card/cone discount.  Due for follow-up visit with Dr. Estanislado Pandy Message sent to the neurologist inquiring whether she still needs to be on aspirin and Brilinta given the bruising that she has on her thighs.  We will check CBC today. - Ambulatory referral to Interventional Radiology  7. Seizure disorder ([place]) No recent seizures.  Continue Keppra  8. Need for influenza vaccination Given today.  9. Screening for  colon cancer Discussed colon cancer screening methods.  She prefers to do the fit test then colonoscopy. - Fecal occult blood, imunochemical([facility]/Sun[facility])     Patient was given the opportunity to ask questions.  Patient verbalized understanding of the plan and was able to repeat key elements of the plan.  [employer] used during this encounter. #[id number]  Orders Placed This Encounter  Procedures  . Fecal occult blood, imunochemical([facility]/Sunquest)  . CBC  . Comprehensive metabolic panel  . Ambulatory referral to Interventional Radiology  . Amb ref to Medical Nutrition Therapy-MNT  . POCT glycosylated hemoglobin (Hb A1C)     Requested Prescriptions   Signed Prescriptions Disp Refills  . glucose blood (TRUE METRIX BLOOD GLUCOSE TEST) test  strip 100 each 11    Sig: Use as instructed  . diclofenac Sodium (VOLTAREN) 1 % GEL 100 g 1    Sig: Apply 2 g topically 4 (four) times daily.    Return in about 4 months (around 10/16/2020).  Karle Plumber, MD, FACP

## 2020-06-16 NOTE — Patient Instructions (Signed)
Influenza Virus Vaccine injection (Fluarix) °¿Qué es este medicamento? °La VACUNA ANTIGRIPAL ayuda a disminuir el riesgo de contraer la influenza, también conocida como la gripe. La vacuna solo ayuda a protegerle contra algunas cepas de influenza. Esta vacuna no ayuda a reducir el riesgo de contraer influenza pandémica H1N1. °Este medicamento puede ser utilizado para otros usos; si tiene alguna pregunta consulte con su proveedor de atención médica o con su farmacéutico. °MARCAS COMUNES: Fluarix, Fluzone °¿Qué le debo informar a mi profesional de la salud antes de tomar este medicamento? °Necesita saber si usted presenta alguno de los siguientes problemas o situaciones: °· trastorno de sangrado como hemofilia °· fiebre o infección °· síndrome de Guillain-Barre u otros problemas neurológicos °· problemas del sistema inmunológico °· infección por el virus de la inmunodeficiencia humana (VIH) o SIDA °· niveles bajos de plaquetas en la sangre °· esclerosis múltiple °· una reacción alérgica o inusual a las vacunas antigripales, a los huevos, proteínas de pollo, al látex, a la gentamicina, a otros medicamentos, alimentos, colorantes o conservantes °· si está embarazada o buscando quedar embarazada °· si está amamantando a un bebé °¿Cómo debo utilizar este medicamento? °Esta vacuna se administra mediante inyección por vía intramuscular. Lo administra un profesional de la salud. °Recibirá una copia de información escrita sobre la vacuna antes de cada vacuna. Asegúrese de leer este folleto cada vez cuidadosamente. Este folleto puede cambiar con frecuencia. °Hable con su pediatra para informarse acerca del uso de este medicamento en niños. Puede requerir atención especial. °Sobredosis: Póngase en contacto inmediatamente con un centro toxicológico o una sala de urgencia si usted cree que haya tomado demasiado medicamento. °ATENCIÓN: Este medicamento es solo para usted. No comparta este medicamento con nadie. °¿Qué sucede si me  olvido de una dosis? °No se aplica en este caso. °¿Qué puede interactuar con este medicamento? °· quimioterapia o radioterapia °· medicamentos que suprimen el sistema inmunológico, tales como etanercept, anakinra, infliximab y adalimumab °· medicamentos que tratan o previenen coágulos sanguíneos, como warfarina °· fenitoína °· medicamentos esteroideos, como la prednisona o la cortisona °· teofilina °· vacunas °Puede ser que esta lista no menciona todas las posibles interacciones. Informe a su profesional de la salud de todos los productos a base de hierbas, medicamentos de venta libre o suplementos nutritivos que esté tomando. Si usted fuma, consume bebidas alcohólicas o si utiliza drogas ilegales, indíqueselo también a su profesional de la salud. Algunas sustancias pueden interactuar con su medicamento. °¿A qué debo estar atento al usar este medicamento? °Informe a su médico o a su profesional de la salud sobre todos los efectos secundarios que persistan después de 3 días. Llame a su proveedor de atención médica si se presentan síntomas inusuales dentro de las 6 semanas posteriores a la vacunación. °Es posible que todavía pueda contraer la gripe, pero la enfermedad no será tan fuerte como normalmente. No puede contraer la gripe de esta vacuna. La vacuna antigripal no le protege contra resfríos u otras enfermedades que pueden causar fiebre. Debe vacunarse cada año. °¿Qué efectos secundarios puedo tener al utilizar este medicamento? °Efectos secundarios que debe informar a su médico o a su profesional de la salud tan pronto como sea posible: °· reacciones alérgicas como erupción cutánea, picazón o urticarias, hinchazón de la cara, labios o lengua °Efectos secundarios que, por lo general, no requieren atención médica (debe informarlos a su médico o a su profesional de la salud si persisten o si son molestos): °· fiebre °· dolor de cabeza °· molestias y dolores musculares °·   dolor, sensibilidad, enrojecimiento o  hinchazón en el lugar de la inyección °· cansancio o debilidad °Puede ser que esta lista no menciona todos los posibles efectos secundarios. Comuníquese a su médico por asesoramiento médico sobre los efectos secundarios. Usted puede informar los efectos secundarios a la FDA por teléfono al 1-800-FDA-1088. °¿Dónde debo guardar mi medicina? °Esta vacuna se administra solamente en clínicas, farmacias, consultorio médico u otro consultorio de un profesional de la salud y no necesitará guardarlo en su domicilio. °ATENCIÓN: Este folleto es un resumen. Puede ser que no cubra toda la posible información. Si usted tiene preguntas acerca de esta medicina, consulte con su médico, su farmacéutico o su profesional de la salud. °© 2020 Elsevier/Gold Standard (2010-03-23 15:31:40) ° °

## 2020-06-17 LAB — CBC
Hematocrit: 43.6 % (ref 34.0–46.6)
Hemoglobin: 14.1 g/dL (ref 11.1–15.9)
MCH: 29.3 pg (ref 26.6–33.0)
MCHC: 32.3 g/dL (ref 31.5–35.7)
MCV: 91 fL (ref 79–97)
Platelets: 273 10*3/uL (ref 150–450)
RBC: 4.81 x10E6/uL (ref 3.77–5.28)
RDW: 12.4 % (ref 11.7–15.4)
WBC: 6.7 10*3/uL (ref 3.4–10.8)

## 2020-06-17 LAB — COMPREHENSIVE METABOLIC PANEL
ALT: 17 IU/L (ref 0–32)
AST: 20 IU/L (ref 0–40)
Albumin/Globulin Ratio: 1.5 (ref 1.2–2.2)
Albumin: 4.1 g/dL (ref 3.8–4.8)
Alkaline Phosphatase: 95 IU/L (ref 44–121)
BUN/Creatinine Ratio: 25 (ref 12–28)
BUN: 13 mg/dL (ref 8–27)
Bilirubin Total: 0.4 mg/dL (ref 0.0–1.2)
CO2: 24 mmol/L (ref 20–29)
Calcium: 9.3 mg/dL (ref 8.7–10.3)
Chloride: 103 mmol/L (ref 96–106)
Creatinine, Ser: 0.53 mg/dL — ABNORMAL LOW (ref 0.57–1.00)
GFR calc Af Amer: 116 mL/min/{1.73_m2} (ref >59)
GFR calc non Af Amer: 101 mL/min/{1.73_m2} (ref >59)
Globulin, Total: 2.8 g/dL (ref 1.5–4.5)
Glucose: 93 mg/dL (ref 65–99)
Potassium: 4.8 mmol/L (ref 3.5–5.2)
Sodium: 140 mmol/L (ref 134–144)
Total Protein: 6.9 g/dL (ref 6.0–8.5)

## 2020-06-17 NOTE — Telephone Encounter (Signed)
Contacted pt and Arna Medici translated for me. Pt is aware of Dr. Laural Benes message and doesn't have any questions or concerns

## 2020-06-20 ENCOUNTER — Telehealth: Payer: Self-pay

## 2020-06-20 NOTE — Telephone Encounter (Signed)
[employer] [place]  Id# [id number]  contacted pt to go over lab results pt is aware and doesn't have any questions or concerns  °

## 2020-06-23 ENCOUNTER — Encounter (HOSPITAL_COMMUNITY): Payer: Self-pay

## 2020-06-23 ENCOUNTER — Other Ambulatory Visit: Payer: Self-pay

## 2020-06-23 ENCOUNTER — Ambulatory Visit (HOSPITAL_COMMUNITY): Admission: RE | Admit: 2020-06-23 | Payer: No Typology Code available for payment source | Source: Ambulatory Visit

## 2020-06-23 ENCOUNTER — Other Ambulatory Visit (HOSPITAL_COMMUNITY): Payer: Self-pay | Admitting: Interventional Radiology

## 2020-06-23 DIAGNOSIS — R42 Dizziness and giddiness: Secondary | ICD-10-CM

## 2020-06-23 DIAGNOSIS — I771 Stricture of artery: Secondary | ICD-10-CM

## 2020-06-23 DIAGNOSIS — R519 Headache, unspecified: Secondary | ICD-10-CM

## 2020-06-25 ENCOUNTER — Other Ambulatory Visit: Payer: Self-pay | Admitting: Internal Medicine

## 2020-06-25 DIAGNOSIS — F32A Depression, unspecified: Secondary | ICD-10-CM

## 2020-07-07 ENCOUNTER — Ambulatory Visit (HOSPITAL_COMMUNITY)
Admission: RE | Admit: 2020-07-07 | Discharge: 2020-07-07 | Disposition: A | Discharge status: Home / Self Care | Payer: Self-pay | Source: Ambulatory Visit | Attending: Interventional Radiology | Admitting: Interventional Radiology

## 2020-07-07 ENCOUNTER — Other Ambulatory Visit: Payer: Self-pay

## 2020-07-07 DIAGNOSIS — R519 Headache, unspecified: Secondary | ICD-10-CM | POA: Insufficient documentation

## 2020-07-07 DIAGNOSIS — I771 Stricture of artery: Secondary | ICD-10-CM | POA: Insufficient documentation

## 2020-07-07 DIAGNOSIS — R42 Dizziness and giddiness: Secondary | ICD-10-CM | POA: Insufficient documentation

## 2020-07-09 ENCOUNTER — Telehealth (HOSPITAL_COMMUNITY): Payer: Self-pay | Admitting: Radiology

## 2020-07-09 NOTE — Telephone Encounter (Signed)
Called and spoke to pt's son. Per Deveshwar recent scan is stable. Follow-up in 6 months time. They agree with this plan of care. JM

## 2020-07-13 ENCOUNTER — Other Ambulatory Visit: Payer: Self-pay | Admitting: Internal Medicine

## 2020-07-13 DIAGNOSIS — Z8673 Personal history of transient ischemic attack (TIA), and cerebral infarction without residual deficits: Secondary | ICD-10-CM

## 2020-07-13 NOTE — Telephone Encounter (Signed)
Requested medication (s) are due for refill today: Early  Requested medication (s) are on the active medication list: Yes  Last refill:  06/01/20  Future visit scheduled: No  Notes to clinic:  See request.    Requested Prescriptions  Pending Prescriptions Disp Refills   BRILINTA 90 MG TABS tablet [Pharmacy Med Name: BRILINTA 90 MG TABLET] 60 tablet 3    Sig: TAKE 1 TABLET (90 MG TOTAL) BY MOUTH 2 (TWO) TIMES DAILY.      Not Delegated - Hematology: Antiplatelets - ticagrelor Failed - 07/13/2020  9:11 AM      Failed - This refill cannot be delegated      Failed - Cr in normal range and within 180 days    Creat  Date Value Ref Range Status  09/05/2016 0.53 0.50 - 0.99 mg/dL Final    Comment:      For patients > or = 65 years of age: The upper reference limit for Creatinine is approximately 13% higher for people identified as African-American.      Creatinine, Ser  Date Value Ref Range Status  06/16/2020 0.53 (L) 0.57 - 1.00 mg/dL Final          Passed - HCT in normal range and within 180 days    Hematocrit  Date Value Ref Range Status  06/16/2020 43.6 34.0 - 46.6 % Final          Passed - HGB in normal range and within 180 days    Hemoglobin  Date Value Ref Range Status  06/16/2020 14.1 11.1 - 15.9 g/dL Final          Passed - PLT in normal range and within 180 days    Platelets  Date Value Ref Range Status  06/16/2020 273 150 - 450 x10E3/uL Final          Passed - Valid encounter within last 6 months    Recent Outpatient Visits           3 weeks ago Need for influenza vaccination   [facility] [place], Jeannett Senior L, RPH-CPP   3 weeks ago Diabetes mellitus type 2 in obese [facility])   [facility] Marcine Matar, MD   7 months ago Chronic right shoulder pain   [facility] Jonah Blue B, MD   10 months ago Diabetes mellitus type 2 in obese [facility])   [place]  [facility] Marcine Matar, MD   1 year ago Diabetes mellitus type 2 in obese [facility])   [facility] [place], [place], [place]

## 2020-07-15 ENCOUNTER — Telehealth: Payer: Self-pay | Admitting: Internal Medicine

## 2020-07-15 NOTE — Telephone Encounter (Signed)
Copied from CRM [id number]. Topic: General - Other >> Jul 14, 2020  8:54 AM Wyonia Hough E wrote: Reason for CRM: Pt states she has no taste or smell since the weekend and wanted to seek advise on what she can take to help/ she mention a bit of congestion as well/ please advise >> Jul 15, 2020  3:15 PM Leafy Ro wrote: Pt is calling back checking previous message

## 2020-07-15 NOTE — Telephone Encounter (Signed)
Called patient and scheduled an appt. With Dr. Cato Mulligan.

## 2020-07-15 NOTE — Telephone Encounter (Signed)
Please contact pt and schedule a virtual visit with swords

## 2020-07-16 ENCOUNTER — Encounter: Payer: Self-pay | Admitting: Internal Medicine

## 2020-07-16 ENCOUNTER — Other Ambulatory Visit: Payer: Self-pay

## 2020-07-16 ENCOUNTER — Ambulatory Visit: Payer: Self-pay | Attending: Internal Medicine | Admitting: Internal Medicine

## 2020-07-16 DIAGNOSIS — Z20828 Contact with and (suspected) exposure to other viral communicable diseases: Secondary | ICD-10-CM

## 2020-07-16 NOTE — Progress Notes (Signed)
This is a Management consultant (virtual) assisted by interpreter.   Reviewed notes from yesterday. She has lost some taste and smell fore the past few days. She feels well otherwise.   She is fully immunized against covid (no "booster" yet).. She denies and fever or SOB.   URI sxs, potential breakthrough Covid- await test results.   She has no significant concerning symptoms.   She should self quarantine and wear a mask.

## 2020-08-03 ENCOUNTER — Other Ambulatory Visit: Payer: Self-pay | Admitting: Internal Medicine

## 2020-08-03 NOTE — Telephone Encounter (Signed)
Requested medication (s) are due for refill today: no  Requested medication (s) are on the active medication list: yes  Last refill:  05/26/20 #30 2 refills  Future visit scheduled: no  Notes to clinic:  not delegated per protocol     Requested Prescriptions  Pending Prescriptions Disp Refills   meclizine (ANTIVERT) 12.5 MG tablet [Pharmacy Med Name: MECLIZINE 12.5 MG TABL 12.5 Tablet] 30 tablet 2    Sig: TAKE 1 TABLET (12.5 MG TOTAL) BY MOUTH 2 (TWO) TIMES DAILY AS NEEDED FOR DIZZINESS.      Not Delegated - Gastroenterology: Antiemetics Failed - 08/03/2020  9:42 AM      Failed - This refill cannot be delegated      Passed - Valid encounter within last 6 months    Recent Outpatient Visits           2 weeks ago    [facility] [place], Valetta Mole, MD   1 month ago Need for influenza vaccination   [facility] Drucilla Chalet, RPH-CPP   1 month ago Diabetes mellitus type 2 in obese [facility])   [place] [facility] Marcine Matar, MD   8 months ago Chronic right shoulder pain   [place] [facility] Marcine Matar, MD   11 months ago Diabetes mellitus type 2 in obese [facility])   [facility] Marcine Matar, MD

## 2020-08-06 ENCOUNTER — Ambulatory Visit: Payer: No Typology Code available for payment source | Admitting: Registered"

## 2020-08-19 ENCOUNTER — Encounter: Payer: Self-pay | Admitting: Registered"

## 2020-08-19 ENCOUNTER — Encounter: Payer: No Typology Code available for payment source | Attending: Internal Medicine | Admitting: Registered"

## 2020-08-19 ENCOUNTER — Other Ambulatory Visit: Payer: Self-pay

## 2020-08-19 DIAGNOSIS — Z713 Dietary counseling and surveillance: Secondary | ICD-10-CM | POA: Insufficient documentation

## 2020-08-19 NOTE — Progress Notes (Signed)
Appointment start time: 8:10 Appointment end time: 9:16  Patient was seen on 08/19/2020 for nutrition counseling.  Medical Nutrition Therapy   Primary concerns today: eating healthy, weight loss  Referral diagnosis: obesity Preferred learning style: no preference indicated Learning readiness: ready, change in progress   NUTRITION ASSESSMENT   Pt arrives states she checks her BS: FBS (120-139) and after meals 80-90. States she was not aware of being diagnosed with diabetes. Labs reveal elevated A1C 6.7 in 12/2017 and has decreased since then. Recent A1c was WNL at 5.2 in 06/2020. Pt states she was weighing about 278 lbs when A1c was elevated in 12/2017. Reports she has lost about 60 lbs since then and wants to continue to lose weight but has been unable to.   States she had a stroke 3 years ago and started having trouble with BS afterwards. States she tries to avoid things that compromise her diabetes such as sweets and fat things. States she does not feel hungry during other parts of the day. States she has been trying to eat less to help with weight loss. Reports getting tired of drinking protein shakes.    Eats fruit during the day also: oranges, tangerines, a variety of berries. States she will eat fruit with oatmeal. Will also eat it after dinner if still hungry.   Pt states she used to attend the gym but stopped going during pandemic and does not plan to attend in the near future due to ongoing pandemic. States she walks outside when weather is nice. States her grandson mentioned buying her an exercise bike to have in the living room so she doesn't have to go back to the gym.   Pt expectations: wants help to keep losing weight  Anthropometrics     Clinical Medical Hx: Type 2 diabetes, hypertension, transient ischemic attack, stroke, major depression, seizures Medications: See list Labs: A1c (5.2) Notable Signs/Symptoms: none  Lifestyle & Dietary Hx   Estimated daily fluid  intake: 112 oz Supplements:  Sleep: 9 hrs/night Stress / self-care: walks with granddaughter in stroller Current average weekly physical activity: walking 30-45 min,  4x/days  24-Hr Dietary Recall First Meal: Premier protein shake or oatmeal + berries Snack:  Second Meal: Premier protein shake Snack:  Third Meal: air fried chicken breast/salmon/fish + banana puree or vegetables or carrots Snack: greek yogurt  Beverages: water (7*16 oz; 112 oz)   NUTRITION DIAGNOSIS  NB-1.1 Food and nutrition-related knowledge deficit as related to  insufficient intake as evidenced by dietary recall.  NUTRITION INTERVENTION  Nutrition education (E-1) on the following topics:  Nutrition education and counseling. Pt was educated on the benefits of eating a variety of food groups at each meal. Discussed the purpose of each food group and ways to create balance with already established regimen. Discussed eating every 3-5 hours to help adequately nourish body, ways to balance meals/snacks. Discussed importance of physical activity.    Handouts Provided Include   Healthy Meal Planning  Learning Style & Readiness for Change Teaching method utilized: Visual & Auditory  Demonstrated degree of understanding via: Teach Back  Barriers to learning/adherence to lifestyle change: none identified  Goals Established by Pt  Follow Diabetes Meal Plan as instructed  Eat 3 balanced meals and snacks, every 3-5 hrs  Limit carbohydrate intake to 30-45 grams carbohydrate/meal  Limit carbohydrate intake to 15-30 grams carbohydrate/snack  Snacks include source of carbohydrates plus source of protein  Aim for 30 mins of physical activity about 5 days/week  Keep  up the great work getting adequate sleep and drinking adequate fluids.    MONITORING & EVALUATION Dietary intake, weekly physical activity, and lab values at next appointment.  Next Steps  Patient is to follow-up prn.

## 2020-08-19 NOTE — Patient Instructions (Signed)
Goals:  Follow Diabetes Meal Plan as instructed  Eat 3 balanced meals and snacks, every 3-5 hrs  Limit carbohydrate intake to 30-45 grams carbohydrate/meal  Limit carbohydrate intake to 15-30 grams carbohydrate/snack  Snacks include source of carbohydrates plus source of protein  Aim for 30 mins of physical activity about 5 days/week  Keep up the great work getting adequate sleep and drinking adequate fluids.

## 2020-08-21 ENCOUNTER — Other Ambulatory Visit: Payer: Self-pay | Admitting: Internal Medicine

## 2020-09-15 ENCOUNTER — Telehealth: Payer: Self-pay | Admitting: Internal Medicine

## 2020-09-15 NOTE — Telephone Encounter (Signed)
Patient called requesting to speak with Heather [name] regarding some financial assistance. Please f/u patient states it's urgent.

## 2020-09-15 NOTE — Telephone Encounter (Signed)
I return Pt call, she was inform if she need to go to a dentist she need to have a referral from her PCP

## 2020-09-30 ENCOUNTER — Other Ambulatory Visit: Payer: Self-pay | Admitting: Internal Medicine

## 2020-09-30 NOTE — Telephone Encounter (Signed)
Requested medication (s) are due for refill today: yes  Requested medication (s) are on the active medication list: yes  Last refill:  08/03/20 #30 with 2 refills  Future visit scheduled: no  Notes to clinic:  Please review for refill. Refill not delegated per protocol.     Requested Prescriptions  Pending Prescriptions Disp Refills   meclizine (ANTIVERT) 12.5 MG tablet [Pharmacy Med Name: MECLIZINE 12.5 MG TABL 12.5 Tablet] 30 tablet 2    Sig: TAKE 1 TABLET (12.5 MG TOTAL) BY MOUTH 2 (TWO) TIMES DAILY AS NEEDED FOR DIZZINESS.      Not Delegated - Gastroenterology: Antiemetics Failed - 09/30/2020  2:39 PM      Failed - This refill cannot be delegated      Passed - Valid encounter within last 6 months    Recent Outpatient Visits           2 months ago    [facility] [place], Valetta Mole, MD   3 months ago Need for influenza vaccination   [facility] [place], Cornelius Moras, RPH-CPP   3 months ago Diabetes mellitus type 2 in obese [facility])   [place] [facility] Marcine Matar, MD   10 months ago Chronic right shoulder pain   [facility] Marcine Matar, MD   1 year ago Diabetes mellitus type 2 in obese [facility])   [facility] Marcine Matar, MD

## 2020-11-03 ENCOUNTER — Telehealth: Payer: Self-pay | Admitting: Internal Medicine

## 2020-11-03 DIAGNOSIS — Z0120 Encounter for dental examination and cleaning without abnormal findings: Secondary | ICD-10-CM

## 2020-11-03 NOTE — Telephone Encounter (Signed)
Will forward to pcp

## 2020-11-03 NOTE — Telephone Encounter (Signed)
Patient came into the clinic to request a dental referral so that she can have her teeth checked out.

## 2020-11-13 ENCOUNTER — Other Ambulatory Visit: Payer: Self-pay

## 2020-11-13 ENCOUNTER — Other Ambulatory Visit: Payer: Self-pay | Admitting: Internal Medicine

## 2020-11-13 ENCOUNTER — Ambulatory Visit: Payer: Self-pay | Attending: Internal Medicine | Admitting: Internal Medicine

## 2020-11-13 ENCOUNTER — Encounter: Payer: Self-pay | Admitting: Internal Medicine

## 2020-11-13 VITALS — BP 118/75 | HR 71 | Resp 16 | Wt 241.2 lb

## 2020-11-13 DIAGNOSIS — G8929 Other chronic pain: Secondary | ICD-10-CM

## 2020-11-13 DIAGNOSIS — Z23 Encounter for immunization: Secondary | ICD-10-CM

## 2020-11-13 DIAGNOSIS — R233 Spontaneous ecchymoses: Secondary | ICD-10-CM

## 2020-11-13 DIAGNOSIS — L309 Dermatitis, unspecified: Secondary | ICD-10-CM

## 2020-11-13 DIAGNOSIS — M25561 Pain in right knee: Secondary | ICD-10-CM

## 2020-11-13 DIAGNOSIS — M25562 Pain in left knee: Secondary | ICD-10-CM

## 2020-11-13 DIAGNOSIS — Z1231 Encounter for screening mammogram for malignant neoplasm of breast: Secondary | ICD-10-CM

## 2020-11-13 DIAGNOSIS — E1169 Type 2 diabetes mellitus with other specified complication: Secondary | ICD-10-CM

## 2020-11-13 DIAGNOSIS — R238 Other skin changes: Secondary | ICD-10-CM

## 2020-11-13 DIAGNOSIS — E669 Obesity, unspecified: Secondary | ICD-10-CM

## 2020-11-13 DIAGNOSIS — I771 Stricture of artery: Secondary | ICD-10-CM | POA: Insufficient documentation

## 2020-11-13 DIAGNOSIS — G40909 Epilepsy, unspecified, not intractable, without status epilepticus: Secondary | ICD-10-CM

## 2020-11-13 MED ORDER — DICLOFENAC SODIUM 1 % EX GEL: 2.0000 g | Freq: Four times a day (QID) | CUTANEOUS | 1 refills | Status: DC

## 2020-11-13 MED ORDER — NYSTATIN 100000 UNIT/GM EX CREA: TOPICAL_CREAM | CUTANEOUS | 0 refills | Status: DC

## 2020-11-13 NOTE — Progress Notes (Signed)
Patient ID: Heather [name], female    DOB: 12/26/1955  MRN: [id number]  CC: Leg Pain and Knee Pain   Subjective: Heather [name] is a 65 y.o. female who presents for UC visit Her concerns today include:  Hx of DM, HTN, HL,former smoker tob dep, CVART MCA territory, s/p thrombectomy followed by stenting of M2 by interventional radiology, sz disorder, depression.   Had COVID-19 3 mths ago.  Reports that she only had mild symptoms of loss of taste and smell.  She is fully recovered.   She has received 2 shots of the [place] vaccine on 12/12/19, 01/09/20 Due for Prevnar 13.  Agreeable to receiving this today.  DM: checks daily.  Gives range of 97-120.  She feels she does well with her eating habits.  Does not eat too many sweets snacks and no sugary drinks.  SZ: no sz since last visit.  Reports compliance with Keppra.  C/o pain in knees x6 months.  She feels she has deformity in the knees.   Knees click when walking.  No swelling Pain worse at nights.  Takes Tylenol which helps a little.  She has not had any falls.  On last visit she complained of easy bruising on Brilinta and aspirin.  CBC was normal.  I had touch base with the neurologist Dr. Leonie Man to find out whether it was okay for her to stop one of the agents.  He said it was okay to stop the aspirin since she was 6 months out from her procedure but continue the Brilinta.  Patient however has continued both the aspirin and Brilinta.   Patient Active Problem List   Diagnosis Date Noted  . Major depression in remission ([place]) 06/16/2020  . Class 2 severe obesity with serious comorbidity and body mass index (BMI) of 35.0 to 35.9 in adult [facility]) 06/16/2020  . Stenosis of intracranial vessel 06/16/2020  . Seizures ([place]) 03/23/2019  . TIA (transient ischemic attack) 11/18/2018  . Depression 02/21/2018  . Essential hypertension 02/21/2018  . Cerebrovascular accident (CVA) due to embolism of right middle cerebral artery  ([place]) 02/21/2018  . Diabetes mellitus type 2 in obese ([place]) 12/20/2017  . 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 Left hemiparesis ([place])   . Dysphagia, oropharyngeal   . 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 acetaminophen (TYLENOL) 500 MG tablet Take 1,000 mg by mouth every 8 (eight) hours as needed for moderate pain or headache.    Marland Kitchen aspirin 81 MG chewable tablet Chew 1 tablet (81 mg total) by mouth daily. (Patient not taking: Reported on 07/16/2020)    . Blood Glucose Monitoring Suppl (TRUE METRIX METER) DEVI 1 kit by Does not apply route 4 (four) times daily. 1 each 0  . BRILINTA 90 MG TABS tablet TAKE 1 TABLET (90 MG TOTAL) BY MOUTH 2 (TWO) TIMES DAILY. 60 tablet 3  . Cholecalciferol (VITAMIN D3 PO) Take 1 tablet by mouth daily.    . diclofenac Sodium (VOLTAREN) 1 % GEL Apply 2 g topically 4 (four) times daily. 100 g 1  . escitalopram (LEXAPRO) 10 MG tablet TAKE 1 TABLET (10 MG TOTAL) BY MOUTH DAILY. 90 tablet 1  . glimepiride (AMARYL) 1 MG tablet TAKE 1 TABLET BY MOUTH DAILY WITH BREAKFAST. 30 tablet 2  . glucose blood (TRUE METRIX BLOOD GLUCOSE TEST) test strip Use as instructed 100  each 11  . levETIRAcetam (KEPPRA) 500 MG tablet Take 1 tablet (500 mg total) by mouth 2 (two) times daily. 180 tablet 3  . meclizine (ANTIVERT) 12.5 MG tablet TAKE 1 TABLET (12.5 MG TOTAL) BY MOUTH 2 (TWO) TIMES DAILY AS NEEDED FOR DIZZINESS. 30 tablet 2  . rosuvastatin (CRESTOR) 5 MG tablet Take 1 tablet (5 mg total) by mouth daily at 6 PM. 30 tablet 2  . traMADol (ULTRAM) 50 MG tablet Take by mouth every 6 (six) hours as needed.    . TRUEplus Lancets 28G MISC 28 g by Does not apply route QID. 120 each 3   No current facility-administered medications on file prior to visit.    No Known Allergies  Social History   Socioeconomic History  . Marital status: Single    Spouse name:  Not on file  . Number of children: Not on file  . Years of education: Not on file  . Highest education level: Not on file  Occupational History  . Not on file  Tobacco Use  . Smoking status: Former Smoker    Packs/day: 0.50    Years: 30.00    Pack years: 15.00    Types: Cigarettes    Quit date: 12/04/2017    Years since quitting: 2.9  . Smokeless tobacco: Never Used  Vaping Use  . Vaping Use: Never used  Substance and Sexual Activity  . Alcohol use: Not Currently    Comment: weekly  . Drug use: No  . Sexual activity: Not Currently    Birth control/protection: None  Other Topics Concern  . Not on file  Social History Narrative  . Not on file   Social Determinants of Health   Financial Resource Strain: Not on file  Food Insecurity: Not on file  Transportation Needs: Not on file  Physical Activity: Not on file  Stress: Not on file  Social Connections: Not on file  Intimate Partner Violence: Not on file    Family History  Problem Relation Age of Onset  . Diabetes Mother   . Pulmonary disease Mother   . Heart disease Father   . Stroke Sister     Past Surgical History:  Procedure Laterality Date  . BREAST EXCISIONAL BIOPSY    . BREAST SURGERY     right breast  . CHOLECYSTECTOMY    . IR ANGIO INTRA EXTRACRAN SEL COM CAROTID INNOMINATE BILAT MOD SED  03/06/2018  . IR ANGIO INTRA EXTRACRAN SEL COM CAROTID INNOMINATE BILAT MOD SED  11/20/2018  . IR ANGIO INTRA EXTRACRAN SEL COM CAROTID INNOMINATE BILAT MOD SED  10/31/2019  . IR ANGIO VERTEBRAL SEL VERTEBRAL BILAT MOD SED  03/06/2018  . IR ANGIO VERTEBRAL SEL VERTEBRAL BILAT MOD SED  11/20/2018  . IR ANGIO VERTEBRAL SEL VERTEBRAL BILAT MOD SED  10/31/2019  . IR CT HEAD LTD  12/18/2017  . IR INTRA CRAN STENT  12/18/2017  . IR PERCUTANEOUS ART THROMBECTOMY/INFUSION INTRACRANIAL INC DIAG ANGIO  12/18/2017  . IR RADIOLOGIST EVAL & MGMT  01/31/2018  . IR US GUIDE VASC ACCESS RIGHT  10/31/2019  . RADIOLOGY WITH ANESTHESIA N/A 12/18/2017    Procedure: RADIOLOGY WITH ANESTHESIA;  Surgeon: Luanne Bras, MD;  Location: [facility];  Service: Radiology;  Laterality: N/A;    ROS: Review of Systems Complains of an itchy rash on the left side of the abdomen close to the abdominal fold.  Present x1 week.  No initiating factors. PHYSICAL EXAM: BP 118/75   Pulse 71  Resp 16   Wt 241 lb 3.2 oz (109.4 kg)   SpO2 98%   BMI 36.67 kg/m   Wt Readings from Last 3 Encounters:  11/13/20 241 lb 3.2 oz (109.4 kg)  06/16/20 231 lb (104.8 kg)  05/12/20 230 lb (104.3 kg)    Physical Exam  General appearance - alert, well appearing, and in no distress Mental status - normal mood, behavior, speech, dress, motor activity, and thought processes Chest - clear to auscultation, no wheezes, rales or rhonchi, symmetric air entry Heart - normal rate, regular rhythm, normal S1, S2, no murmurs, rubs, clicks or gallops Musculoskeletal -knees: Joints are enlarged.  She also has a lot of soft tissue fat around both knees which she thinks is a malformation.  Mild crepitus on passive range of motion but passive range of motion is good.  No point tenderness along the joint lines.  Skin: two macular  3 cm areas on the left side of the abdomen.  Slight erythema.  Well-defined borders with central clearing. CMP Latest Ref Rng & Units 06/16/2020 10/31/2019 03/23/2019  Glucose 65 - 99 mg/dL 93 100(H) 135(H)  BUN 8 - 27 mg/dL _0 Creatinine 0.57 - 1.00 mg/dL 0.53(L) 0.30(L) 0.40(L)  Sodium 134 - 144 mmol/L 140 143 141  Potassium 3.5 - 5.2 mmol/L 4.8 4.5 4.8  Chloride 96 - 106 mmol/L 103 108 108  CO2 20 - 29 mmol/L 24 - -  Calcium 8.7 - 10.3 mg/dL 9.3 - -  Total Protein 6.0 - 8.5 g/dL 6.9 - -  Total Bilirubin 0.0 - 1.2 mg/dL 0.4 - -  Alkaline Phos 44 - 121 IU/L 95 - -  AST 0 - 40 IU/L 20 - -  ALT 0 - 32 IU/L 17 - -   Lipid Panel     Component Value Date/Time   CHOL 103 05/12/2020 0919   TRIG 53 05/12/2020 0919   HDL 54 05/12/2020 0919   CHOLHDL  1.9 05/12/2020 0919   CHOLHDL 2.5 03/24/2019 0611   VLDL 15 03/24/2019 0611   LDLCALC 36 05/12/2020 0919    CBC    Component Value Date/Time   WBC 6.7 06/16/2020 1140   WBC 5.5 10/31/2019 0847   RBC 4.81 06/16/2020 1140   RBC 4.62 10/31/2019 0847   HGB 14.1 06/16/2020 1140   HCT 43.6 06/16/2020 1140   PLT 273 06/16/2020 1140   MCV 91 06/16/2020 1140   MCH 29.3 06/16/2020 1140   MCH 30.5 10/31/2019 0847   MCHC 32.3 06/16/2020 1140   MCHC 33.7 10/31/2019 0847   RDW 12.4 06/16/2020 1140   LYMPHSABS 2.1 03/23/2019 1250   MONOABS 0.5 03/23/2019 1250   EOSABS 0.1 03/23/2019 1250   BASOSABS 0.0 03/23/2019 1250   Depression screen [facility] 2/9 11/13/2020 08/19/2020 06/16/2020  Decreased Interest 0 0 0  Down, Depressed, Hopeless 0 0 0  PHQ - 2 Score 0 0 0  Altered sleeping - - -  Tired, decreased energy - - -  Change in appetite - - -  Feeling bad or failure about yourself  - - -  Trouble concentrating - - -  Moving slowly or fidgety/restless - - -  Suicidal thoughts - - -  PHQ-9 Score - - -  Difficult doing work/chores - - -     ASSESSMENT AND PLAN: 1. Diabetes mellitus type 2 in obese ([place]) Home blood sugar readings are at goal.  Continue Metformin. Discussed and encourage healthy eating habits. Encouraged her to move  as much as she can.  2. Seizure disorder ([place]) Controlled on Keppra.  3. Need for vaccination against Streptococcus pneumoniae using pneumococcal conjugate vaccine 13 Given  4. Chronic pain of both knees Likely osteoarthritis.  We will get some baseline x-rays.  Strongly encouraged weight loss.  I have refilled Voltaren gel to use with Tylenol. - DG Knee Complete 4 Views Right; Future - DG Knee Complete 4 Views Left; Future - diclofenac Sodium (VOLTAREN) 1 % GEL; Apply 2 g topically 4 (four) times daily.  Dispense: 100 g; Refill: 1  5. Easy bruising Stop aspirin.  Continue Brilinta  6. Encounter for screening mammogram for malignant neoplasm of  breast - MM Digital Screening; Future  7. Dermatitis This looks like a fungal dermatitis. - nystatin cream (MYCOSTATIN); Apply to affected area on abdomen twice a day.  Dispense: 30 g; Refill: 0  F/U in 6 wks for PAP  Patient was given the opportunity to ask questions.  Patient verbalized understanding of the plan and was able to repeat key elements of the plan.  [employer] interpreter used during this encounter. #[id number] Christy Sartorius  No orders of the defined types were placed in this encounter.    Requested Prescriptions    No prescriptions requested or ordered in this encounter    No follow-ups on file.  Karle Plumber, MD, FACP

## 2020-11-13 NOTE — Patient Instructions (Addendum)
Diabetes mellitus y nutricin, en adultos Diabetes Mellitus and Nutrition, Adult Si sufre de diabetes, o diabetes mellitus, es muy importante tener hbitos alimenticios saludables debido a que sus niveles de Designer, television/film set sangre (glucosa) se ven afectados en gran medida por lo que come y bebe. [place] alimentos saludables en las cantidades correctas, aproximadamente a la misma hora todos los [place], [place] ayudar a:  Aeronautical engineer glucemia.  Disminuir el riesgo de sufrir una enfermedad cardaca.  Mejorar la presin arterial.  Science writer o mantener un peso saludable. Qu puede afectar mi plan de alimentacin? Todas las personas que sufren de diabetes son diferentes y cada una tiene necesidades diferentes en cuanto a un plan de alimentacin. El mdico puede recomendarle que trabaje con un nutricionista para elaborar el mejor plan para usted. Su plan de alimentacin puede variar segn factores como:  Las caloras que necesita.  Los medicamentos que toma.  Su peso.  Sus niveles de glucemia, presin arterial y colesterol.  Su nivel de [place].  Otras afecciones que tenga, como enfermedades cardacas o renales. Cmo me afectan los carbohidratos? Los carbohidratos, o hidratos de carbono, afectan su nivel de glucemia ms que cualquier otro tipo de alimento. La ingesta de carbohidratos naturalmente aumenta la cantidad de [employer]. El recuento de carbohidratos es un mtodo destinado a Catering manager un registro de la cantidad de carbohidratos que se consumen. El recuento de carbohidratos es importante para Theatre manager la glucemia a un nivel saludable, especialmente si utiliza insulina o toma determinados medicamentos por va oral para la diabetes. Es importante conocer la cantidad de carbohidratos que se pueden ingerir en cada comida sin correr Engineer, manufacturing. Esto es Psychologist, forensic. Su nutricionista puede ayudarlo a calcular la cantidad de carbohidratos que debe ingerir en cada comida y en cada  refrigerio. Cmo me afecta el alcohol? El alcohol puede provocar disminuciones sbitas de la glucemia (hipoglucemia), especialmente si utiliza insulina o toma determinados medicamentos por va oral para la diabetes. La hipoglucemia es una afeccin potencialmente mortal. Los sntomas de la hipoglucemia, como somnolencia, mareos y confusin, son similares a los sntomas de haber consumido demasiado alcohol.  No beba alcohol si: ? Su mdico le indica no hacerlo. ? Est embarazada, puede estar embarazada o est tratando de quedar embarazada.  Si bebe alcohol: ? No beba con el estmago vaco. ? Limite la cantidad que bebe:  De 0 a 1 medida por da para las mujeres.  De 0 a 2 medidas por da para los hombres. ? Est atento a la cantidad de alcohol que hay en las bebidas que toma. En los [place], una medida equivale a una botella de cerveza de 12oz (368m), un vaso de vino de 5oz (1468m o un vaso de una bebida alcohlica de alta graduacin de 1oz (4485m ? Mantngase hidratado bebiendo agua, refrescos dietticos o t helado sin azcar.  Tenga en cuenta que los refrescos comunes, los jugos y otras bebida para mezOptician, dispensingeden contener mucha azcar y se deben contar como carbohidratos. Consejos para seguir estPhotographers etiquetas de los alimentos  Comience por leer el tamao de la porcin en la "Informacin nutricional" en las etiquetas de los alimentos envasados y las bebidas. La cantidad de caloras, carbohidratos, grasas y otros nutrientes mencionados en la etiqueta se basan en una porcin del alimento. Muchos alimentos contienen ms de una porcin por envase.  Verifique la cantidad total de gramos (g) de carbohidratos totales en una porcin. Puede calcular la cantidad de porciones  de carbohidratos al dividir el total de carbohidratos por 15. Por ejemplo, si un alimento tiene un total de 30g de carbohidratos totales por porcin, equivale a 2 porciones de  carbohidratos.  Verifique la cantidad de gramos (g) de grasas saturadas y grasas trans de una porcin. Escoja alimentos que no contengan estas grasas o que su contenido de estas sea [place].  Verifique la cantidad de miligramos (mg) de sal (sodio) en una porcin. La State Farm de las personas deben limitar la ingesta de sodio total a menos de 2333m por dTraining and development officer  Siempre consulte la informacin nutricional de los alimentos etiquetados como "con bajo contenido de grasa" o "sin grasa". Estos alimentos pueden tener un mayor contenido de aLocation manageragregada o carbohidratos refinados, y deben evitarse.  Hable con su nutricionista para identificar sus objetivos diarios en cuanto a los nutrientes mencionados en la etiqueta. Al ir de compras  Evite comprar alimentos procesados, enlatados o precocidos. Estos alimentos tienden a tSpecial educational needs teachermayor cantidad de g[place] sodio y azcar agregada.  Compre en la zona exterior de la tienda de comestibles. Esta es la zona donde se encuentran con mayor frecuencia las frutas y las verduras frescas, los cereales a granel, las carnes frescas y los productos lcteos frescos. Al cocinar  Utilice mtodos de coccin a baja temperatura, como hornear, en lugar de mtodos de coccin a alta temperatura, como frer en abundante aceite.  Cocine con aceites saludables, como el aceite de oFrontier canola o gPiketon  Evite cocinar con manteca, crema o carnes con alto contenido de grasa. Planificacin de las comidas  Coma las comidas y los refrigerios regularmente, preferentemente a la misma hora todos lKnight[place] Evite pasar largos perodos de tiempo sin comer.  Consuma alimentos ricos en fibra, como frutas frescas, verduras, frijoles y cereales integrales. Consulte a su nutricionista sobre cuntas porciones de carbohidratos puede consumir en cada comida.  Consuma entre 4 y 6 onzas (entre 112 y 168g) de protenas magras por da, como carnes magras, pollo, pescado, huevos o tofu. Una onza (oz) de  protena magra equivale a: ? 1 onza (28g) de carne, pollo o pescado. ? 1huevo. ?  de taza (62 g) de tofu.  Coma algunos alimentos por da que contengan grasas saludables, como aguacates, frutos secos, semillas y pescado.   Qu alimentos debo comer? FLambert ModyBayas. Manzanas. Naranjas. Duraznos. Damascos. Ciruelas. Uvas. Mango. Papaya. GTolna Kiwi. Cerezas. VHolland CommonsLValeda Malm Espinaca. Verduras de hBoeing que incluyen col rizada, aFolsom hojas de bIraqy de mFountain Remolachas. Coliflor. Repollo. Brcoli. Zanahorias. Judas verdes. Tomates. Pimientos. Cebollas. Pepinos. Coles de Bruselas. Granos Granos integrales, como panes, galletas, tortillas, cereales y pastas de salvado o integrales. Avena sin azcar. Quinua. Arroz integral o salvaje. Carnes y oPsychiatric nurse Carne de ave sin piel. Cortes magros de ave y carne de res. Tofu. Frutos secos. Semillas. Lcteos Productos lcteos sin grasa o con bajo contenido de gPepin cUpper Stewartsville yogur y qKahaluu-Keauhou Es posible que los productos que se enumeran ms aNew Caledoniano constituyan una lista completa de los alimentos y las bebidas que puede tomar. Consulte a un nutricionista para obtener ms informacin. Qu alimentos debo evitar? FLambert ModyFrutas enlatadas al almbar. Verduras Verduras enlatadas. Verduras congeladas con mantequilla o salsa de crema. Granos Productos elaborados con hIsraely hLao People's Democratic Republic como panes, pastas, bocadillos y cereales. Evite todos los alimentos procesados. Carnes y otras protenas Cortes de carne con alto contenido de gLobbyist Carne de ave con piel. Carnes empanizadas o fritas. Carne procesada. Evite las grasas saturadas.  Lcteos Yogur, queso o Cardinal Health. Bebidas Bebidas azucaradas, como gaseosas o t helado. Es posible que los productos que se enumeran ms [place] no constituyan una lista completa de los alimentos y las bebidas que Personnel officer. Consulte a un nutricionista para obtener ms  informacin. Preguntas para hacerle al mdico  Es necesario que me rena con IT trainer en el cuidado de la diabetes?  Es necesario que me rena con un nutricionista?  A qu nmero puedo llamar si tengo preguntas?  Cules son los mejores momentos para controlar la glucemia? Dnde encontrar ms informacin:  Asociacin Estadounidense de la Diabetes (American Diabetes Association): diabetes.org  Academy of Nutrition and Dietetics (Academia de Nutricin y Pension scheme manager): www.eatright.AK Steel Holding Corporation of Diabetes and Digestive and Kidney Diseases Deere & Company de la Diabetes y las Enfermedades Digestivas y Renales): CarFlippers.tn  Association of Diabetes Care and Education Specialists (Asociacin de Especialistas en Atencin y Educacin sobre la Diabetes): www.diabeteseducator.org Resumen  Es importante tener hbitos alimenticios saludables debido a que sus niveles de Psychologist, counselling sangre (glucosa) se ven afectados en gran medida por lo que come y bebe.  Un plan de alimentacin saludable lo ayudar a controlar la glucemia y Pharmacologist un estilo de vida saludable.  El mdico puede recomendarle que trabaje con un nutricionista para elaborar el mejor plan para usted.  Tenga en cuenta que los carbohidratos (hidratos de carbono) y el alcohol tienen efectos inmediatos en sus niveles de glucemia. Es importante contar los carbohidratos que ingiere y consumir alcohol con prudencia. Esta informacin no tiene Theme park manager el consejo del mdico. Asegrese de hacerle al mdico cualquier pregunta que tenga. Document Revised: 10/24/2019 Document Reviewed: 10/24/2019 Elsevier Patient Education  2021 Elsevier Inc.   Madilyn Fireman antineumoccica conjugada (PCV13): Lo que debe saber Pneumococcal Conjugate Vaccine (PCV13): What You Need to Know 1. Por qu vacunarse? La vacuna antineumoccica conjugada (PCV13) puede prevenir la enfermedad neumoccica. Enfermedad neumoccica hace  referencia a cualquier enfermedad causada por bacterias neumoccicas. Estas bacterias pueden causar muchos tipos de enfermedades, entre ellas neumona, que es una infeccin de los pulmones. Las bacterias neumoccicas son Neomia Dear de las causas ms comunes de la neumona. Adems de neumona, las bacterias neumoccicas tambin pueden causar lo siguiente:  Infecciones en los odos  Infecciones de los senos paranasales  Meningitis (infeccin del tejido que recubre el cerebro y la mdula espinal)  Bacteriemia (infeccin de la Retail buyer) Cualquier persona puede contraer la enfermedad neumoccica, pero los nios menores de 2 aos, las personas con Exxon Mobil Corporation, los adultos mayores de 65 aos y los fumadores son los que corren un mayor riesgo. La mayora de las infecciones neumoccicas son leves. Sin embargo, algunas pueden causar problemas a largo plazo, como dao cerebral o prdida de la audicin. La meningitis, la bacteriemia y la neumona causadas por la enfermedad neumoccica pueden ser mortales. 2. PCV13 La vacuna PCV13 protege contra 13 tipos de bacterias que causan la enfermedad neumoccica. Los bebs y los nios pequeos por lo general necesitan 4 dosis de la vacuna antineumoccica conjugada, a los 2, 4, 6 y 16 a 15 meses de edad. Los nios mayores (Lubrizol Corporation de edad) pueden ser vacunados si no recibieron las dosis recomendadas. Tambin se recomienda una dosis de la vacuna PCV13 para adultos y nios de 6aos de edad o ms con ciertas enfermedades si todava no recibieron la vacuna PCV13. Esta vacuna se puede administrar a adultos sanos de 65 aos o ms que todava no recibieron la vacuna PCV13 si as  lo deciden tras conversar con su mdico. 3. Hable con el mdico Comunquese con la persona que le coloca las vacunas si la persona que la recibe:  Ha tenido una reaccin alrgica despus de Neomia Dear dosis previa de la vacuna PCV13, a una vacuna neumoccica conjugada anterior conocida como PCV7  o a cualquier vacuna que contenga toxoide diftrico (por ejemplo, difteria, ttanos y tos [place] [DTap]), o tiene [place] grave y potencialmente mortal En algunos casos, es posible que el mdico decida posponer la aplicacin de la vacuna PCV13 hasta una visita en el futuro. Las personas que sufren trastornos menores, como un resfro, pueden vacunarse. Las personas que tienen enfermedades moderadas o graves generalmente deben esperar hasta recuperarse para poder vacunarse con la PCV13. Su mdico puede darle ms informacin. 4. Riesgos de [place] reaccin a la vacuna  Puede sentir enrojecimiento, hinchazn, dolor o sensibilidad en Immunologist de la inyeccin, y Scientist, research (physical sciences), prdida del apetito, nerviosismo (irritabilidad), cansancio, dolor de [place] y escalofros despus de recibir la vacunacin con PCV13. Los nios pequeos pueden correr un mayor riesgo de sufrir convulsiones causadas por la fiebre despus de la administracin de la vacuna PCV13 si esta se administra al mismo tiempo que una vacuna contra la gripe inactivada. Consulte a su mdico para obtener ms informacin. Las personas a veces se desmayan despus de procedimientos mdicos, incluida la vacunacin. Informe al mdico si se siente mareado, tiene cambios en la visin o zumbidos en los odos. Al igual que con cualquier Automatic Data, existe una probabilidad muy remota de que una vacuna cause una reaccin alrgica grave, otra lesin grave o la muerte. 5. Qu pasa si se presenta un problema grave? Podra producirse una reaccin alrgica despus de que la persona vacunada abandone la clnica. Si observa signos de Runner, broadcasting/film/video grave (ronchas, hinchazn de la cara y la garganta, dificultad para respirar, latidos cardacos acelerados, mareos o debilidad), llame al 9-1-1 y lleve a la persona al hospital ms cercano. Si se presentan otros signos que le preocupan, comunquese con su mdico. Las reacciones adversas deben informarse al Sistema  de Informe de Eventos Adversos de Administrator, arts (VAERS). Por lo general, el mdico presenta este informe o puede hacerlo usted mismo. Visite el sitio web del VAERS en www.vaers.LAgents.no o llame al (984)760-0230. El VAERS es solo para Biomedical engineer, y los miembros de su personal no proporcionan asesoramiento mdico. 6. Programa Nacional de Compensacin de Daos por American Electric Power [place] de Compensacin de Daos por Administrator, arts (VICP) es un programa federal que fue creado para compensar a las personas que puedan haber sufrido daos al recibir ciertas vacunas. Las Investment banker, operational a presuntas lesiones o muerte debidas a la vacunacin tienen un lmite de tiempo para su presentacin, que puede ser de tan solo Lexmark International. Visite el sitio web del VICP en SpiritualWord.at o llame al 1-(586) 738-6253 para obtener ms informacin acerca del programa y de cmo presentar un reclamo. 7. Cmo puedo obtener ms informacin?  Pregntele a su mdico.  Comunquese con el servicio de salud de su localidad o su estado.  Visite el sitio web de Tax inspector) (Administracin de Alimentos y Media planner) para ver los prospectos de las vacunas e informacin adicional en FinderList.no.  Comunquese con Building control surveyor for SPX Corporation) (Centros para el Control y la Prevencin de [place]): ? Llame al 587-201-6222 (1-800-CDC-INFO) o ? Visite el sitio Environmental manager en PicCapture.uy. Declaracin de informacin sobre la vacuna PCV13 (05/08/2020) Esta informacin  no tiene Theme park manager el consejo del mdico. Asegrese de hacerle al mdico cualquier pregunta que tenga. Document Revised: 07/07/2020 Document Reviewed: 07/04/2020 Elsevier Patient Education  2021 ArvinMeritor.

## 2020-11-13 NOTE — Progress Notes (Signed)
Pt states her pain is coming from b/l leg and knee

## 2020-11-16 ENCOUNTER — Telehealth: Payer: Self-pay | Admitting: Internal Medicine

## 2020-11-16 NOTE — Telephone Encounter (Signed)
Called pt through interpreter to ask if she would like to schedule a covid vaccine booster appt. Pt accepted and an appt was made for her on Thurs, 2/17 at 1 pm for a covid booster.

## 2020-11-19 ENCOUNTER — Ambulatory Visit: Payer: No Typology Code available for payment source

## 2020-11-19 ENCOUNTER — Telehealth: Payer: Self-pay | Admitting: Internal Medicine

## 2020-11-19 NOTE — Telephone Encounter (Signed)
Copied from CRM [id number]. Topic: Referral - Request for Referral >> Nov 19, 2020 10:08 AM Louie Bun, Rosey Bath D wrote: Has patient seen PCP for this complaint? NO *If NO, is insurance requiring patient see PCP for this issue before PCP can refer them? Referral for which specialty: dentist Preferred provider/office: anywhere insurance will cover Reason for referral: Patient called and is requesting a referral to go see a dentist for molars pain. She would like to speak with pcp about this. Please call pt back, thanks.   Call placed to patient with interpreter services and LVM advising her to call back [phone] regarding her dental referral/ We have submitted this referral to [facility]. Their phone number is [phone] located at [address] [place] [address]. Let her know to call to schedule an appt.

## 2020-11-23 ENCOUNTER — Telehealth: Payer: Self-pay | Admitting: Internal Medicine

## 2020-11-23 DIAGNOSIS — K029 Dental caries, unspecified: Secondary | ICD-10-CM

## 2020-11-23 NOTE — Telephone Encounter (Signed)
Will forward to provider  

## 2020-11-23 NOTE — Telephone Encounter (Signed)
Copied from CRM [id number]. Topic: General - Other >> Nov 23, 2020  3:09 PM Marylen Ponto wrote: Reason for CRM: Pt stated she has the orange card and she needs a referral to a dentist to remove a tooth. Pt requests call back to discuss referral to a dentist

## 2020-11-25 ENCOUNTER — Telehealth: Payer: Self-pay | Admitting: Internal Medicine

## 2020-11-25 NOTE — Telephone Encounter (Signed)
Pt filled out mammogram scholarship application on last visit. Please have pt contact [facility] ar [phone] to follow up in regards to mammogram

## 2020-11-25 NOTE — Telephone Encounter (Signed)
Copied from CRM [id number]. Topic: General - Other >> Nov 24, 2020  4:44 PM Randol Kern wrote: Reason for CRM: Pt was sent to get a mammogram and was asked to pay $500,   Best contact: [phone] - pt is requesting a call back to select an alternative location where she does not have to pay.

## 2020-11-26 ENCOUNTER — Other Ambulatory Visit: Payer: Self-pay | Admitting: Internal Medicine

## 2020-12-15 ENCOUNTER — Ambulatory Visit: Payer: No Typology Code available for payment source

## 2020-12-22 ENCOUNTER — Other Ambulatory Visit (HOSPITAL_COMMUNITY): Payer: Self-pay | Admitting: Interventional Radiology

## 2020-12-22 ENCOUNTER — Other Ambulatory Visit: Payer: Self-pay

## 2020-12-22 ENCOUNTER — Ambulatory Visit: Payer: Self-pay | Attending: Internal Medicine

## 2020-12-22 DIAGNOSIS — I639 Cerebral infarction, unspecified: Secondary | ICD-10-CM

## 2020-12-28 ENCOUNTER — Other Ambulatory Visit: Payer: Self-pay | Admitting: Internal Medicine

## 2020-12-28 DIAGNOSIS — Z8673 Personal history of transient ischemic attack (TIA), and cerebral infarction without residual deficits: Secondary | ICD-10-CM

## 2020-12-28 NOTE — Telephone Encounter (Signed)
Requested medication (s) are due for refill today: no  Requested medication (s) are on the active medication list: yes  Last refill:  05/29/2020  Future visit scheduled: yes  Notes to clinic:  This refill cannot be delegated   Requested Prescriptions  Pending Prescriptions Disp Refills   BRILINTA 90 MG TABS tablet [Pharmacy Med Name: BRILINTA 90 MG TABLET] 60 tablet 3    Sig: TAKE 1 TABLET (90 MG TOTAL) BY MOUTH 2 (TWO) TIMES DAILY.      Not Delegated - Hematology: Antiplatelets - ticagrelor Failed - 12/28/2020  9:00 AM      Failed - This refill cannot be delegated      Failed - Cr in normal range and within 180 days    Creat  Date Value Ref Range Status  09/05/2016 0.53 0.50 - 0.99 mg/dL Final    Comment:      For patients > or = 67 years of age: The upper reference limit for Creatinine is approximately 13% higher for people identified as African-American.      Creatinine, Ser  Date Value Ref Range Status  06/16/2020 0.53 (L) 0.57 - 1.00 mg/dL Final          Failed - HCT in normal range and within 180 days    Hematocrit  Date Value Ref Range Status  06/16/2020 43.6 34.0 - 46.6 % Final          Failed - HGB in normal range and within 180 days    Hemoglobin  Date Value Ref Range Status  06/16/2020 14.1 11.1 - 15.9 g/dL Final          Failed - PLT in normal range and within 180 days    Platelets  Date Value Ref Range Status  06/16/2020 273 150 - 450 x10E3/uL Final          Passed - Valid encounter within last 6 months    Recent Outpatient Visits           1 month ago Diabetes mellitus type 2 in obese [facility]   [facility] Marcine Matar, MD   5 months ago    [facility] [place], Valetta Mole, MD   6 months ago Need for influenza vaccination   [facility] [place], Cornelius Moras, RPH-CPP   6 months ago Diabetes mellitus type 2 in obese [facility])   [facility] Marcine Matar, MD   1 year ago Chronic right shoulder pain   [facility] Marcine Matar, MD       Future Appointments             In 1 month Laural Benes, Binnie Rail, MD [facility]

## 2020-12-30 NOTE — Telephone Encounter (Signed)
Patient came into the clinic today. She renewed her orange card and needs the dental referral submitted ASAP. Patient states it is an emergency and she is not feeling well because of her tooth. Please follow up.

## 2021-01-01 NOTE — Telephone Encounter (Signed)
Noted  Sent Urgent Referral to [facility] ph. # [phone] [address] [place], [place] [address] They will contact the patient to schedule an appointment

## 2021-01-04 ENCOUNTER — Ambulatory Visit (HOSPITAL_COMMUNITY): Admission: RE | Admit: 2021-01-04 | Payer: Self-pay | Source: Ambulatory Visit

## 2021-01-05 NOTE — Telephone Encounter (Signed)
Called patient using interpreter services and advised her of the referral being sent. Patient understood.

## 2021-01-29 ENCOUNTER — Other Ambulatory Visit: Payer: Self-pay | Admitting: Internal Medicine

## 2021-01-29 ENCOUNTER — Other Ambulatory Visit: Payer: Self-pay

## 2021-01-29 MED FILL — Ticagrelor Tab 90 MG: ORAL | 30 days supply | Qty: 60 | Fill #0 | Status: AC

## 2021-01-29 MED FILL — Glimepiride Tab 1 MG: ORAL | 30 days supply | Qty: 30 | Fill #0 | Status: AC

## 2021-01-29 MED FILL — Levetiracetam Tab 500 MG: ORAL | 30 days supply | Qty: 60 | Fill #0 | Status: AC

## 2021-01-29 MED FILL — Meclizine HCl Tab 12.5 MG: ORAL | 30 days supply | Qty: 60 | Fill #0 | Status: AC

## 2021-02-19 ENCOUNTER — Encounter: Payer: Self-pay | Admitting: Internal Medicine

## 2021-02-19 ENCOUNTER — Other Ambulatory Visit: Payer: Self-pay

## 2021-02-19 ENCOUNTER — Ambulatory Visit (HOSPITAL_BASED_OUTPATIENT_CLINIC_OR_DEPARTMENT_OTHER): Payer: Self-pay | Admitting: Internal Medicine

## 2021-02-19 VITALS — BP 108/76 | HR 82 | Resp 16 | Wt 249.2 lb

## 2021-02-19 DIAGNOSIS — Z5321 Procedure and treatment not carried out due to patient leaving prior to being seen by health care provider: Secondary | ICD-10-CM

## 2021-02-19 NOTE — Progress Notes (Signed)
Patient had presented for office visit for Pap smear.  Her appointment was at 11:10 a.m. she was roomed. I went in to see patient at 11:47 AM.  But found that she had left.  Front desk personnel told me she checked out at 11:37 AM stating that she had to go pick up her daughter and that she will call back to reschedule.

## 2021-02-25 ENCOUNTER — Other Ambulatory Visit: Payer: Self-pay

## 2021-02-25 ENCOUNTER — Other Ambulatory Visit: Payer: Self-pay | Admitting: Internal Medicine

## 2021-02-25 MED FILL — Levetiracetam Tab 500 MG: ORAL | 30 days supply | Qty: 60 | Fill #1 | Status: AC

## 2021-02-25 MED FILL — Glimepiride Tab 1 MG: ORAL | 30 days supply | Qty: 30 | Fill #1 | Status: AC

## 2021-02-25 MED FILL — Ticagrelor Tab 90 MG: ORAL | 30 days supply | Qty: 60 | Fill #1 | Status: AC

## 2021-02-25 MED FILL — Ticagrelor Tab 90 MG: ORAL | 30 days supply | Qty: 60 | Fill #1 | Status: CN

## 2021-02-25 MED FILL — Meclizine HCl Tab 12.5 MG: ORAL | 15 days supply | Qty: 30 | Fill #1 | Status: AC

## 2021-02-25 NOTE — Telephone Encounter (Signed)
Pt called and is requesting to have this refilled. She states that she is completely out of this medication and that the pharmacy is requesting to have a new prescription for this medication. Please advise.

## 2021-02-26 ENCOUNTER — Other Ambulatory Visit: Payer: Self-pay

## 2021-02-26 MED ORDER — ESCITALOPRAM OXALATE 10 MG PO TABS
ORAL_TABLET | Freq: Every day | ORAL | 0 refills | Status: DC
  Filled 2021-02-26: qty 30, 30d supply, fill #0

## 2021-02-26 NOTE — Addendum Note (Signed)
Addended by: Lois Huxley, Jeannett Senior L on: 02/26/2021 09:14 AM   Modules accepted: Orders

## 2021-02-26 NOTE — Telephone Encounter (Signed)
Rx sent for 1 month. Pt needs an appointment. She has one upcoming in June. Must keep that appointment for additional refills.

## 2021-03-02 ENCOUNTER — Other Ambulatory Visit: Payer: Self-pay

## 2021-03-17 ENCOUNTER — Encounter (HOSPITAL_COMMUNITY): Payer: Self-pay | Admitting: Pharmacy Technician

## 2021-03-17 ENCOUNTER — Other Ambulatory Visit: Payer: Self-pay

## 2021-03-17 ENCOUNTER — Emergency Department (HOSPITAL_COMMUNITY)
Admission: EM | Admit: 2021-03-17 | Discharge: 2021-03-17 | Disposition: A | Discharge status: Home / Self Care | Payer: No Typology Code available for payment source | Attending: Emergency Medicine | Admitting: Emergency Medicine

## 2021-03-17 DIAGNOSIS — Z79899 Other long term (current) drug therapy: Secondary | ICD-10-CM | POA: Insufficient documentation

## 2021-03-17 DIAGNOSIS — K029 Dental caries, unspecified: Secondary | ICD-10-CM | POA: Insufficient documentation

## 2021-03-17 DIAGNOSIS — R519 Headache, unspecified: Secondary | ICD-10-CM | POA: Insufficient documentation

## 2021-03-17 DIAGNOSIS — Z87891 Personal history of nicotine dependence: Secondary | ICD-10-CM | POA: Insufficient documentation

## 2021-03-17 DIAGNOSIS — Z7984 Long term (current) use of oral hypoglycemic drugs: Secondary | ICD-10-CM | POA: Insufficient documentation

## 2021-03-17 DIAGNOSIS — E119 Type 2 diabetes mellitus without complications: Secondary | ICD-10-CM | POA: Insufficient documentation

## 2021-03-17 DIAGNOSIS — I10 Essential (primary) hypertension: Secondary | ICD-10-CM | POA: Insufficient documentation

## 2021-03-17 LAB — CBC WITH DIFFERENTIAL/PLATELET
Abs Immature Granulocytes: 0.01 10*3/uL (ref 0.00–0.07)
Basophils Absolute: 0 10*3/uL (ref 0.0–0.1)
Basophils Relative: 1 %
Eosinophils Absolute: 0.1 10*3/uL (ref 0.0–0.5)
Eosinophils Relative: 1 %
HCT: 45.3 % (ref 36.0–46.0)
Hemoglobin: 14.5 g/dL (ref 12.0–15.0)
Immature Granulocytes: 0 %
Lymphocytes Relative: 28 %
Lymphs Abs: 2.1 10*3/uL (ref 0.7–4.0)
MCH: 30.3 pg (ref 26.0–34.0)
MCHC: 32 g/dL (ref 30.0–36.0)
MCV: 94.8 fL (ref 80.0–100.0)
Monocytes Absolute: 0.5 10*3/uL (ref 0.1–1.0)
Monocytes Relative: 7 %
Neutro Abs: 4.7 10*3/uL (ref 1.7–7.7)
Neutrophils Relative %: 63 %
Platelets: 285 10*3/uL (ref 150–400)
RBC: 4.78 MIL/uL (ref 3.87–5.11)
RDW: 13.2 % (ref 11.5–15.5)
WBC: 7.3 10*3/uL (ref 4.0–10.5)
nRBC: 0 % (ref 0.0–0.2)

## 2021-03-17 LAB — BASIC METABOLIC PANEL
Anion gap: 7 (ref 5–15)
BUN: 10 mg/dL (ref 8–23)
CO2: 25 mmol/L (ref 22–32)
Calcium: 8.6 mg/dL — ABNORMAL LOW (ref 8.9–10.3)
Chloride: 106 mmol/L (ref 98–111)
Creatinine, Ser: 0.53 mg/dL (ref 0.44–1.00)
GFR, Estimated: >60 mL/min (ref >60)
Glucose, Bld: 128 mg/dL — ABNORMAL HIGH (ref 70–99)
Potassium: 4.2 mmol/L (ref 3.5–5.1)
Sodium: 138 mmol/L (ref 135–145)

## 2021-03-17 MED ORDER — AMOXICILLIN 500 MG PO CAPS: 500.0000 mg | ORAL_CAPSULE | Freq: Three times a day (TID) | ORAL | 0 refills | Status: DC

## 2021-03-17 NOTE — ED Triage Notes (Signed)
Pt here pov with reports of R sided headache, neck pain and dental pain for several days. Pt with no facial droop, unilateral weakness.

## 2021-03-17 NOTE — ED Provider Notes (Signed)
Emergency Medicine Provider Triage Evaluation Note  [facility] Wilburn Cornelia , a 65 y.o. female  was evaluated in triage.  Pt complains of right-sided headache x3 days. She endorses right upper posterior dental pain with numerous episodes of bleeding. She has seen a dentist; however, she states she has to see a specialist to get it fixed. No visual or speech changes. No unilateral weakness. No recent head trauma. Previous history of CVA. No fever or chills.   Review of Systems  Positive: Dental pain, headache Negative: fever  Physical Exam  BP 114/85 (BP Location: Left Arm)   Pulse 80   Temp 98.4 F (36.9 C) (Oral)   Resp 16   SpO2 96%  Gen:   Awake, no distress   Resp:  Normal effort  MSK:   Moves extremities without difficulty  Other:    Medical Decision Making  Medically screening exam initiated at 1:50 PM.  Appropriate orders placed.  Gladyes Davonna Belling Wilburn Cornelia was informed that the remainder of the evaluation will be completed by another provider, this initial triage assessment does not replace that evaluation, and the importance of remaining in the ED until their evaluation is complete.  No obvious abscess or facial edema. Labs ordered. Headache possibly referred pain from tooth.    Mannie Stabile, PA-C 03/17/21 1352    Arby Barrette, MD 03/28/21 1235

## 2021-03-23 ENCOUNTER — Other Ambulatory Visit: Payer: Self-pay | Admitting: Internal Medicine

## 2021-03-23 DIAGNOSIS — Z8673 Personal history of transient ischemic attack (TIA), and cerebral infarction without residual deficits: Secondary | ICD-10-CM

## 2021-03-23 MED ORDER — ESCITALOPRAM OXALATE 10 MG PO TABS
ORAL_TABLET | Freq: Every day | ORAL | 3 refills | Status: DC
  Filled 2021-03-23: qty 30, 30d supply, fill #0
  Filled 2021-04-26: qty 30, 30d supply, fill #1
  Filled 2021-05-25: qty 30, 30d supply, fill #2
  Filled 2021-06-16: qty 30, 30d supply, fill #3

## 2021-03-23 MED ORDER — LEVETIRACETAM 500 MG PO TABS
ORAL_TABLET | Freq: Two times a day (BID) | ORAL | 3 refills | Status: DC
  Filled 2021-03-23: qty 60, 30d supply, fill #0
  Filled 2021-04-26: qty 60, 30d supply, fill #1
  Filled 2021-05-25: qty 60, 30d supply, fill #2
  Filled 2021-06-16: qty 60, 30d supply, fill #3
  Filled 2021-07-15: qty 60, 30d supply, fill #4
  Filled 2021-08-04: qty 60, 30d supply, fill #5
  Filled 2021-09-06: qty 120, 60d supply, fill #6
  Filled 2021-10-24: qty 120, 60d supply, fill #7
  Filled 2021-10-25: qty 60, 30d supply, fill #0
  Filled 2021-11-18: qty 60, 30d supply, fill #1
  Filled 2021-12-19: qty 60, 30d supply, fill #2
  Filled 2022-01-19: qty 60, 30d supply, fill #3

## 2021-03-23 MED ORDER — TICAGRELOR 90 MG PO TABS
ORAL_TABLET | ORAL | 3 refills | Status: DC
  Filled 2021-03-23: qty 120, 60d supply, fill #0
  Filled 2021-04-26: qty 60, 30d supply, fill #1
  Filled 2021-05-25: qty 120, 60d supply, fill #1

## 2021-03-23 MED ORDER — MECLIZINE HCL 12.5 MG PO TABS
12.5000 mg | ORAL_TABLET | Freq: Two times a day (BID) | ORAL | 2 refills | Status: DC | PRN
  Filled 2021-03-23 – 2021-06-16 (×2): qty 30, 15d supply, fill #0

## 2021-03-23 MED ORDER — GLIMEPIRIDE 1 MG PO TABS
ORAL_TABLET | Freq: Every day | ORAL | 5 refills | Status: DC
  Filled 2021-03-23: qty 30, 30d supply, fill #0
  Filled 2021-04-26: qty 30, 30d supply, fill #1
  Filled 2021-05-25: qty 30, 30d supply, fill #2
  Filled 2021-06-16: qty 30, 30d supply, fill #3
  Filled 2021-07-15: qty 30, 30d supply, fill #4
  Filled 2021-08-04: qty 30, 30d supply, fill #5

## 2021-03-23 NOTE — Telephone Encounter (Signed)
Requested medication (s) are due for refill today:  yes ,see multiple Rx request.   Requested medication (s) are on the active medication list: yes  Last refill:    Future visit scheduled: yes in 1 week   Notes to clinic:  CHW- OPRX, not delegated per protocol- keppra, antivert, brilinta     Requested Prescriptions  Pending Prescriptions Disp Refills   ticagrelor (BRILINTA) 90 MG TABS tablet 60 tablet 3    Sig: TAKE 1 TABLET (90 MG TOTAL) BY MOUTH 2 (TWO) TIMES DAILY.      Not Delegated - Hematology: Antiplatelets - ticagrelor Failed - 03/23/2021  4:27 PM      Failed - This refill cannot be delegated      Passed - Cr in normal range and within 180 days    Creat  Date Value Ref Range Status  09/05/2016 0.53 0.50 - 0.99 mg/dL Final    Comment:      For patients > or = 66 years of age: The upper reference limit for Creatinine is approximately 13% higher for people identified as African-American.      Creatinine, Ser  Date Value Ref Range Status  03/17/2021 0.53 0.44 - 1.00 mg/dL Final          Passed - HCT in normal range and within 180 days    HCT  Date Value Ref Range Status  03/17/2021 45.3 36.0 - 46.0 % Final   Hematocrit  Date Value Ref Range Status  06/16/2020 43.6 34.0 - 46.6 % Final          Passed - HGB in normal range and within 180 days    Hemoglobin  Date Value Ref Range Status  03/17/2021 14.5 12.0 - 15.0 g/dL Final  96/28/3662 94.7 11.1 - 15.9 g/dL Final          Passed - PLT in normal range and within 180 days    Platelets  Date Value Ref Range Status  03/17/2021 285 150 - 400 K/uL Final  06/16/2020 273 150 - 450 x10E3/uL Final          Passed - Valid encounter within last 6 months    Recent Outpatient Visits           1 month ago Patient left before evaluation by physician   [facility] Jonah Blue B, MD   4 months ago Diabetes mellitus type 2 in obese [facility])   [facility] [facility] Marcine Matar, MD   8 months ago    [employer] [facility] [place], Valetta Mole, MD   9 months ago Need for influenza vaccination   [facility] [place], Cornelius Moras, RPH-CPP   9 months ago Diabetes mellitus type 2 in obese [facility])   [place] [facility] Marcine Matar, MD       Future Appointments             In 1 week Anders Simmonds, PA-C  [facility]               escitalopram (LEXAPRO) 10 MG tablet 30 tablet 0    Sig: TAKE 1 TABLET (10 MG TOTAL) BY MOUTH DAILY.      Psychiatry:  Antidepressants - SSRI Passed - 03/23/2021  4:27 PM      Passed - Completed PHQ-2 or PHQ-9 in the last 360 days  Passed - Valid encounter within last 6 months    Recent Outpatient Visits           1 month ago Patient left before evaluation by physician   [facility] Jonah Blue B, MD   4 months ago Diabetes mellitus type 2 in obese [facility])   [place] [facility] Marcine Matar, MD   8 months ago    [facility] [place], Valetta Mole, MD   9 months ago Need for influenza vaccination   [facility] [place], RPH-CPP   9 months ago Diabetes mellitus type 2 in obese [facility])   [place] [facility] Marcine Matar, MD       Future Appointments             In 1 week Anders Simmonds, PA-C [place] [facility]               meclizine (ANTIVERT) 12.5 MG tablet 30 tablet 2    Sig: TAKE 1 TABLET (12.5 MG TOTAL) BY MOUTH 2 (TWO) TIMES DAILY AS NEEDED FOR DIZZINESS.      Not Delegated - Gastroenterology: Antiemetics Failed - 03/23/2021  4:27 PM      Failed - This refill cannot be delegated      Passed - Valid encounter within last 6 months    Recent Outpatient Visits           1 month ago Patient left  before evaluation by physician   [facility] Jonah Blue B, MD   4 months ago Diabetes mellitus type 2 in obese [facility])   [place] [facility] Marcine Matar, MD   8 months ago    [employer] [facility] [place], Valetta Mole, MD   9 months ago Need for influenza vaccination   [facility] [place], RPH-CPP   9 months ago Diabetes mellitus type 2 in obese [facility])    [facility] Marcine Matar, MD       Future Appointments             In 1 week Anders Simmonds, PA-C  [facility]               glimepiride (AMARYL) 1 MG tablet 30 tablet 5    Sig: TAKE 1 TABLET BY MOUTH DAILY WITH BREAKFAST.      Endocrinology:  Diabetes - Sulfonylureas Failed - 03/23/2021  4:27 PM      Failed - HBA1C is between 0 and 7.9 and within 180 days    Hemoglobin A1C  Date Value Ref Range Status  06/16/2020 5.2 4.0 - 5.6 % Final   HbA1c, POC (prediabetic range)  Date Value Ref Range Status  02/20/2018 6.3 5.7 - 6.4 % Final   HbA1c POC (<> result, manual entry)  Date Value Ref Range Status  05/08/2018 5.5 4.0 - 5.6 % Final   Hgb A1c MFr Bld  Date Value Ref Range Status  08/23/2019 5.4 4.8 - 5.6 % Final    Comment:             Prediabetes: 5.7 - 6.4          Diabetes: >6.4          Glycemic control for adults  with diabetes: <7.0           Passed - Valid encounter within last 6 months    Recent Outpatient Visits           1 month ago Patient left before evaluation by physician   [facility] Jonah Blue B, MD   4 months ago Diabetes mellitus type 2 in obese [facility])   [place] [facility] Marcine Matar, MD   8 months ago    [facility] [place], Valetta Mole, MD   9 months ago Need for influenza vaccination   [facility] [place], RPH-CPP   9 months ago Diabetes mellitus type 2 in obese [facility])   [place] [facility] Marcine Matar, MD       Future Appointments             In 1 week Anders Simmonds, PA-C [place] [facility]               levETIRAcetam (KEPPRA) 500 MG tablet 180 tablet 3    Sig: TAKE 1 TABLET (500 MG TOTAL) BY MOUTH 2 (TWO) TIMES DAILY.      Not Delegated - Neurology:  Anticonvulsants Failed - 03/23/2021  4:27 PM      Failed - This refill cannot be delegated      Passed - HCT in normal range and within 360 days    HCT  Date Value Ref Range Status  03/17/2021 45.3 36.0 - 46.0 % Final   Hematocrit  Date Value Ref Range Status  06/16/2020 43.6 34.0 - 46.6 % Final          Passed - HGB in normal range and within 360 days    Hemoglobin  Date Value Ref Range Status  03/17/2021 14.5 12.0 - 15.0 g/dL Final  38/25/0539 76.7 11.1 - 15.9 g/dL Final          Passed - PLT in normal range and within 360 days    Platelets  Date Value Ref Range Status  03/17/2021 285 150 - 400 K/uL Final  06/16/2020 273 150 - 450 x10E3/uL Final          Passed - WBC in normal range and within 360 days    WBC  Date Value Ref Range Status  03/17/2021 7.3 4.0 - 10.5 K/uL Final          Passed - Valid encounter within last 12 months    Recent Outpatient Visits           1 month ago Patient left before evaluation by physician   [facility] Jonah Blue B, MD   4 months ago Diabetes mellitus type 2 in obese [facility])   [place] [facility] Marcine Matar, MD   8 months ago    [employer] [facility] [place], Valetta Mole, MD   9 months ago Need for influenza vaccination   [facility] [place], RPH-CPP   9 months ago Diabetes mellitus type 2 in obese [facility])   [place] [facility]  Marcine Matar, MD       Future Appointments             In 1 week Sharon Seller, Marzella Schlein, PA-C [facility]

## 2021-03-23 NOTE — Telephone Encounter (Signed)
Medication Refill - Medication: ticagrelor (BRILINTA) 90 MG TABS tablet escitalopram (LEXAPRO) 10 MG tablet meclizine (ANTIVERT) 12.5 MG tablet glimepiride (AMARYL) 1 MG tablet levETIRAcetam (KEPPRA) 500 MG tablet     Preferred Pharmacy (with phone number or street name):  [facility] Phone:  [phone]  Fax:  [phone]      Agent: Please be advised that RX refills may take up to 3 business days. We ask that you follow-up with your pharmacy.

## 2021-03-24 ENCOUNTER — Other Ambulatory Visit: Payer: Self-pay

## 2021-03-26 ENCOUNTER — Other Ambulatory Visit: Payer: Self-pay

## 2021-04-01 ENCOUNTER — Ambulatory Visit: Payer: Self-pay | Admitting: Physician Assistant

## 2021-04-09 ENCOUNTER — Other Ambulatory Visit: Payer: Self-pay

## 2021-04-16 NOTE — ED Provider Notes (Signed)
[facility] EMERGENCY DEPARTMENT Provider Note   CSN: [id number] Arrival date & time: 03/17/21  1218     History Chief Complaint  Patient presents with   Headache   Dental Pain    Heather [name] is a 65 y.o. female.  The history is provided by the patient. No language interpreter was used.  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   Major depression in remission ([place]) 06/16/2020   Class 2 severe obesity with serious comorbidity and body mass index (BMI) of 35.0 to 35.9 in adult ([place]) 06/16/2020   Stenosis of intracranial vessel 06/16/2020   Seizures ([place]) 03/23/2019   TIA (transient ischemic attack) 11/18/2018   Depression 02/21/2018   Essential hypertension 02/21/2018   Cerebrovascular accident (CVA) due to embolism of right middle cerebral artery ([place]) 02/21/2018   Diabetes mellitus type 2 in obese ([place]) 12/20/2017   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   Left hemiparesis ([place])    Dysphagia, oropharyngeal    Middle cerebral artery embolism, 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    Past Surgical History:  Procedure Laterality Date   BREAST EXCISIONAL BIOPSY     BREAST SURGERY     right breast   CHOLECYSTECTOMY     IR ANGIO INTRA EXTRACRAN SEL COM CAROTID INNOMINATE BILAT MOD SED  03/06/2018   IR ANGIO INTRA EXTRACRAN SEL COM CAROTID INNOMINATE BILAT MOD SED  11/20/2018   IR ANGIO INTRA EXTRACRAN SEL COM CAROTID INNOMINATE BILAT MOD SED   10/31/2019   IR ANGIO VERTEBRAL SEL VERTEBRAL BILAT MOD SED  03/06/2018   IR ANGIO VERTEBRAL SEL VERTEBRAL BILAT MOD SED  11/20/2018   IR ANGIO VERTEBRAL SEL VERTEBRAL BILAT MOD SED  10/31/2019   IR CT HEAD LTD  12/18/2017   IR INTRA CRAN STENT  12/18/2017   IR PERCUTANEOUS ART THROMBECTOMY/INFUSION INTRACRANIAL INC DIAG ANGIO  12/18/2017   IR RADIOLOGIST EVAL & MGMT  01/31/2018   IR US GUIDE VASC ACCESS RIGHT  10/31/2019   RADIOLOGY WITH ANESTHESIA N/A 12/18/2017   Procedure: RADIOLOGY WITH ANESTHESIA;  Surgeon: Luanne Bras, MD;  Location: [place];  Service: Radiology;  Laterality: N/A;     OB History     Gravida  1   Para      Term      Preterm      AB      Living  1      SAB      IAB      Ectopic      Multiple      Live Births  1           Family History  Problem Relation Age of Onset   Diabetes Mother    Pulmonary disease Mother    Heart disease Father    Stroke Sister     Social History   Tobacco Use   Smoking status: Former    Packs/day:  0.50    Years: 30.00    Pack years: 15.00    Types: Cigarettes    Quit date: 12/04/2017    Years since quitting: 3.3   Smokeless tobacco: Never  Vaping Use   Vaping Use: Never used  Substance Use Topics   Alcohol use: Not Currently    Comment: weekly   Drug use: No    Home Medications Prior to Admission medications   Medication Sig Start Date End Date Taking? Authorizing Provider  amoxicillin (AMOXIL) 500 MG capsule Take 1 capsule (500 mg total) by mouth 3 (three) times daily. 03/17/21  Yes Fransico Meadow, PA-C  acetaminophen (TYLENOL) 500 MG tablet Take 1,000 mg by mouth every 8 (eight) hours as needed for moderate pain or headache.    [provider]  Blood Glucose Monitoring Suppl (TRUE METRIX METER) DEVI 1 kit by Does not apply route 4 (four) times daily. 12/02/19   Ladell Pier, MD  Cholecalciferol (VITAMIN D3 PO) Take 1 tablet by mouth daily.    [provider]  diclofenac Sodium  (VOLTAREN) 1 % GEL APPLY 2 G TOPICALLY 4 (FOUR) TIMES DAILY. 11/13/20 11/13/21  Ladell Pier, MD  escitalopram (LEXAPRO) 10 MG tablet TAKE 1 TABLET (10 MG TOTAL) BY MOUTH DAILY. 03/23/21 03/23/22  Ladell Pier, MD  glimepiride (AMARYL) 1 MG tablet TAKE 1 TABLET BY MOUTH DAILY WITH BREAKFAST. 03/23/21 03/23/22  Ladell Pier, MD  glucose blood (TRUE METRIX BLOOD GLUCOSE TEST) test strip Use as instructed 06/16/20   Ladell Pier, MD  glucose blood test strip USE AS INSTRUCTED 06/16/20 06/16/21  Ladell Pier, MD  levETIRAcetam (KEPPRA) 500 MG tablet TAKE 1 TABLET (500 MG TOTAL) BY MOUTH 2 (TWO) TIMES DAILY. 03/23/21 03/23/22  Ladell Pier, MD  meclizine (ANTIVERT) 12.5 MG tablet TAKE 1 TABLET (12.5 MG TOTAL) BY MOUTH 2 (TWO) TIMES DAILY AS NEEDED FOR DIZZINESS. 03/23/21   Ladell Pier, MD  nystatin cream (MYCOSTATIN) APPLY TO AFFECTED AREA ON ABDOMEN TWICE A DAY. 11/13/20 11/13/21  Ladell Pier, MD  rosuvastatin (CRESTOR) 5 MG tablet Take 1 tablet (5 mg total) by mouth daily at 6 PM. 11/13/19   Frann Rider, NP  ticagrelor (BRILINTA) 90 MG TABS tablet TAKE 1 TABLET (90 MG TOTAL) BY MOUTH 2 (TWO) TIMES DAILY. 03/23/21   Ladell Pier, MD  traMADol (ULTRAM) 50 MG tablet Take by mouth every 6 (six) hours as needed.    [provider]  TRUEplus Lancets 28G MISC 28 g by Does not apply route QID. 03/27/19   Argentina Donovan, PA-C    Allergies    Patient has no known allergies.  Review of Systems   Review of Systems  Neurological:  Positive for headaches.  All other systems reviewed and are negative.  Physical Exam Updated Vital Signs BP (!) 127/98 (BP Location: Right Arm)   Pulse 74   Temp 98 F (36.7 C) (Oral)   Resp 18   SpO2 99%   Physical Exam Vitals and nursing note reviewed.  Constitutional:      Appearance: She is well-developed.  HENT:     Head: Normocephalic.  Eyes:     Extraocular Movements: Extraocular movements intact.   Cardiovascular:     Rate and Rhythm: Normal rate.  Pulmonary:     Effort: Pulmonary effort is normal.  Abdominal:     General: There is no distension.  Musculoskeletal:        General: Normal range  of motion.     Cervical back: Normal range of motion.  Neurological:     Mental Status: She is alert and oriented to person, place, and time.  Psychiatric:        Mood and Affect: Mood normal.    ED Results / Procedures / Treatments   Labs (all labs ordered are listed, but only abnormal results are displayed) Labs Reviewed  BASIC METABOLIC PANEL - Abnormal; Notable for the following components:      Result Value   Glucose, Bld 128 (*)    Calcium 8.6 (*)    All other components within normal limits  CBC WITH DIFFERENTIAL/PLATELET    EKG None  Radiology No results found.  Procedures Procedures   Medications Ordered in ED Medications - No data to display  ED Course  I have reviewed the triage vital signs and the nursing notes.  Pertinent labs & imaging results that were available during my care of the patient were reviewed by me and considered in my medical decision making (see chart for details).    MDM Rules/Calculators/A&P                          Mdm: Pt given rx for amoxicillian Pt advised to follow up with dentist Final Clinical Impression(s) / ED Diagnoses Final diagnoses:  Pain due to dental caries    Rx / DC Orders ED Discharge Orders          Ordered    amoxicillin (AMOXIL) 500 MG capsule  3 times daily        03/17/21 1827          An After Visit Summary was printed and given to the patient.    Sidney Ace 04/16/21 [id number]    Noemi Chapel, MD 04/16/21 2030

## 2021-04-23 ENCOUNTER — Other Ambulatory Visit: Payer: Self-pay

## 2021-04-26 ENCOUNTER — Other Ambulatory Visit: Payer: Self-pay

## 2021-04-27 ENCOUNTER — Other Ambulatory Visit: Payer: Self-pay

## 2021-04-28 ENCOUNTER — Other Ambulatory Visit: Payer: Self-pay

## 2021-05-13 ENCOUNTER — Other Ambulatory Visit: Payer: Self-pay

## 2021-05-13 ENCOUNTER — Ambulatory Visit (INDEPENDENT_AMBULATORY_CARE_PROVIDER_SITE_OTHER): Payer: Self-pay | Admitting: Adult Health

## 2021-05-13 ENCOUNTER — Encounter: Payer: Self-pay | Admitting: Adult Health

## 2021-05-13 VITALS — BP 131/89 | HR 69 | Ht 67.0 in | Wt 246.0 lb

## 2021-05-13 DIAGNOSIS — I69398 Other sequelae of cerebral infarction: Secondary | ICD-10-CM

## 2021-05-13 DIAGNOSIS — G44229 Chronic tension-type headache, not intractable: Secondary | ICD-10-CM

## 2021-05-13 DIAGNOSIS — I63311 Cerebral infarction due to thrombosis of right middle cerebral artery: Secondary | ICD-10-CM

## 2021-05-13 DIAGNOSIS — R569 Unspecified convulsions: Secondary | ICD-10-CM

## 2021-05-13 MED ORDER — ROSUVASTATIN CALCIUM 5 MG PO TABS
5.0000 mg | ORAL_TABLET | Freq: Every day | ORAL | 0 refills | Status: DC
  Filled 2021-05-13 – 2021-05-25 (×2): qty 30, 30d supply, fill #0
  Filled 2021-06-16: qty 30, 30d supply, fill #1
  Filled 2021-07-15: qty 30, 30d supply, fill #2

## 2021-05-13 NOTE — Patient Instructions (Addendum)
Continue Brilinta (ticagrelor) 90 mg bid  and Crestor  for secondary stroke prevention  Continue to follow up with PCP regarding cholesterol, blood pressure and diabetes management  Maintain strict control of hypertension with blood pressure goal below 130/90, diabetes with hemoglobin A1c goal below 7% and cholesterol with LDL cholesterol (bad cholesterol) goal below 70 mg/dL.   Continue keppra 500mg  twice daily for seizure prevention  You should be contacted by Dr. office to schedule follow up imaging - needs to also discuss ongoing use of Brilinta and dental procedure      Followup in the future with me in 1 year or call earlier if needed      Thank you for coming to see Reginia Forts at [facility]. I hope we have been able to provide you high quality care today.  You may receive a patient satisfaction survey over the next few weeks. We would appreciate your feedback and comments so that we may continue to improve ourselves and the health of our patients.

## 2021-05-13 NOTE — Progress Notes (Signed)
[facility] [address] [place]. [place] [address] [phone]       OFFICE FOLLOW-UP NOTE  [name] Date of Birth:  1956/04/26 Medical Record Number:  [id number]    Reason for visit: Stroke and seizure follow-up   Chief complaint: Chief Complaint  Patient presents with   Follow-up    Rm 3 alone (& cone interpreter) Pt is well and stable. Having tension on L side of face  No seizures or new symptoms       HPI:   Update 05/13/2021 JM: Returns for yearly routine follow-up visit accompanied by [facility] interpreter.  Reports over the past 6 months, occasional left mouth tension/pulling sensation usually in the evenings.  Denies any other associated symptoms such as visual changes, headache, pain, speech difficulties or focal weakness.  Continued chronic intermittent tension type frontal headaches more recently on the right side likely due to dental carries but unable to have tooth removal (per pt) due to use of Brilinta. Repeat MRA 07/2020 patent R M1 stent -recommended 15-monthfollow-up imaging with Dr. DEstanislado Pandy Not yet completed - she is requesting a referral to IR for repeat imaging.  Recently ran out of Crestor previously taking without side effects.  Blood pressure today 131/89.  Remains on Keppra 500 mg twice daily tolerating without recurrent seizure activity.  No further concerns at this time.    History provided for reference purposes only. Update 05/12/2020 JM: Ms. Heather Aspenreturns for stroke follow-up.  She is accompanied by interpreter  Stable since prior visit without new stroke/TIA symptoms Continues on aspirin and Brilinta (post stent per IR) with easy bruising but no bleeding Continue on Crestor without myalgias Blood pressure today 115/77  No reoccurring seizure type activity or events Continues on Keppra 500 mg twice daily without side effects  She does report chronic intermittent vertigo and requesting refill  of meclizine   Update 11/13/2019:  Stable.  Remains on Keppra without seizure activity ?  Atorvastatin side effect therefore Crestor initiated Blood pressure stable Cerebral angiogram 10/31/2019 50 to 70% patency of the stented segment of right MCA and recommended MRA in 6 months time.   Continues on aspirin and Brilinta as instructed by IR without bleeding or bruising.     05/29/2019 update:  New onset seizure 03/23/2019 -ED eval with reported tonic-clonic seizure Likely late effect of stroke Initiated Keppra without reoccurring seizure activity  Update 11/26/2018 :  Single episode of right-sided facial twitching lasting approximately 10 minutes of unknown etiology CTA 50% IntraStent stenosis  Cerebral angiogram 30 to 40% IntraStent restenosis EEG negative MRI no acute abnormality  Update 08/16/2018:  Stable C/o right-sided intermittent neck pain evaluated by PCP Compliant with aspirin and Brilinta  05/16/2018 visit:  Mild diminished fine motor skills left hand post stroke Patient reported 85% recovery Cerebral angiogram 03/06/2018 50% right MCA IntraStent stenosis  Stroke admission 12/2017: MMilka Windholzis an 65y.o. female Hispanic with PMH of HTN,Obesity who presents [name] room as a stroke alert with left hemiparesis. Patient is Spanish-speaking and initially EMS was told that her symptoms began at 9 PM after she dropped a glass in her left hand. CT head showed no hemorrhage and tPA was mixed. However after nurse spoke to her in [place] patient stated that she had a headache that began around 6:30pm and had left side weakness as she was outside window period upon completion of CT scan.   CTA head and neck was performed which  showed a superior division M2 cutoff. After discussing with family given her borderline NIHSS of 7, however worsening symptoms ( from EMS assessment to arrival at hospital)  we decided to take the patient for mechanical thrombectomy.  Date last known  well:3.17.19Time last known well: around  6.30 pm tPA Given: no, outside window at time of CT scan NIHSS: 7 ( at time of arrival)  Baseline MRS 0  . After written informed consent from the patient and family She was taken to the interventional radiology suite for thrombectomy and right middle cerebral artery  Rescue stent placement performed by Dr. Estanislado Pandy for progressive occlusion of the inferior division of the right middle cerebral artery due to underlying stenosis. She also received 10 mg of superselective into arterial Integrilin. She was given loading dose of 180 mg of Brilinta and aspirin. The patient was initially and in the neurological intensive care unit and closely monitored she did well. MRI scan of the brain showed a moderate-sized right MCA infarct.Marland Kitchen LDL cholesterol 67 mg percent and women A1c was 6.7. Transthoracic echo showed normal ejection fraction. Patient had initial right gaze preference and left hemiplegia but made gradual improvement. She was seen by physical occupational and speech therapy. She was transferred to inpatient rehabilitation and has done well.      ROS:   14 system review of systems is positive for those listed in HPI and all other systems negative  PMH:  Past Medical History:  Diagnosis Date   Depression    Diabetes ([place])    Hypertension    Seizure ([place])    Stroke ([place])     Social History:  Social History   Socioeconomic History   Marital status: Single    Spouse name: Not on file   Number of children: Not on file   Years of education: Not on file   Highest education level: Not on file  Occupational History   Not on file  Tobacco Use   Smoking status: Former    Packs/day: 0.50    Years: 30.00    Pack years: 15.00    Types: Cigarettes    Quit date: 12/04/2017    Years since quitting: 3.4   Smokeless tobacco: Never  Vaping Use   Vaping Use: Never used  Substance and Sexual Activity   Alcohol use: Not Currently    Comment: weekly   Drug  use: No   Sexual activity: Not Currently    Birth control/protection: None  Other Topics Concern   Not on file  Social History Narrative   Not on file   Social Determinants of Health   Financial Resource Strain: Not on file  Food Insecurity: Not on file  Transportation Needs: Not on file  Physical Activity: Not on file  Stress: Not on file  Social Connections: Not on file  Intimate Partner Violence: Not on file    Medications:   Current Outpatient Medications on File Prior to Visit  Medication Sig Dispense Refill   acetaminophen (TYLENOL) 500 MG tablet Take 1,000 mg by mouth every 8 (eight) hours as needed for moderate pain or headache.     Blood Glucose Monitoring Suppl (TRUE METRIX METER) DEVI 1 kit by Does not apply route 4 (four) times daily. 1 each 0   Cholecalciferol (VITAMIN D3 PO) Take 1 tablet by mouth daily.     diclofenac Sodium (VOLTAREN) 1 % GEL APPLY 2 G TOPICALLY 4 (FOUR) TIMES DAILY. 100 g 1   escitalopram (LEXAPRO) 10 MG tablet  TAKE 1 TABLET (10 MG TOTAL) BY MOUTH DAILY. 30 tablet 3   glimepiride (AMARYL) 1 MG tablet TAKE 1 TABLET BY MOUTH DAILY WITH BREAKFAST. 30 tablet 5   glucose blood (TRUE METRIX BLOOD GLUCOSE TEST) test strip Use as instructed 100 each 11   levETIRAcetam (KEPPRA) 500 MG tablet TAKE 1 TABLET (500 MG TOTAL) BY MOUTH 2 (TWO) TIMES DAILY. 180 tablet 3   meclizine (ANTIVERT) 12.5 MG tablet TAKE 1 TABLET (12.5 MG TOTAL) BY MOUTH 2 (TWO) TIMES DAILY AS NEEDED FOR DIZZINESS. 30 tablet 2   ticagrelor (BRILINTA) 90 MG TABS tablet TAKE 1 TABLET (90 MG TOTAL) BY MOUTH 2 (TWO) TIMES DAILY. 60 tablet 3   traMADol (ULTRAM) 50 MG tablet Take by mouth every 6 (six) hours as needed.     TRUEplus Lancets 28G MISC 28 g by Does not apply route QID. 120 each 3   rosuvastatin (CRESTOR) 5 MG tablet Take 1 tablet (5 mg total) by mouth daily at 6 PM. (Patient not taking: Reported on 05/13/2021) 30 tablet 2   No current facility-administered medications on file prior  to visit.    Allergies:  No Known Allergies  Physical Exam  Today's Vitals   05/13/21 0835  BP: 131/89  Pulse: 69  Weight: 246 lb (111.6 kg)  Height: '5\' 7"'  (1.702 m)    Body mass index is 38.53 kg/m.   General: Well-nourished, well-developed pleasant middle-age  Hispanic Spanish-speaking lady, seated, in no evident distress Neck: supple with no carotid or supraclavicular bruits Cardiovascular: regular rate and rhythm, no murmurs Vascular:  Normal pulses all extremities  Neurologic Exam Mental Status: Awake and fully alert.  Primary Spanish speaking.  Denies speech or language difficulty.  Oriented to place and time. Recent and remote memory intact. Attention span, concentration and fund of knowledge appropriate. Mood and affect appropriate.  Cranial Nerves: Pupils equal, briskly reactive to light. Extraocular movements full without nystagmus. Visual fields full to confrontation. Hearing intact. Facial sensation intact.  Mild left facial asymmetry when smiling.  Tongue and palate moves normally and symmetrically.  Motor: Normal bulk and tone. Normal strength in all tested extremity muscles.  Sensory.: intact to touch ,pinprick .position and vibratory sensation.  Coordination: Rapid alternating movements normal in all extremities. Finger-to-nose and heel-to-shin performed accurately bilaterally. Gait and Station: Arises from chair without difficulty. Stance is normal. Gait demonstrates normal stride length and balance without use of assistive device. Able to heel, toe and tandem walk without difficulty.  Reflexes: 1+ and symmetric. Toes downgoing.      ASSESSMENT: 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 Vascular risk factors of intracranial stenosis, diabetes, hypertension and hyperlipidemia.  Prior hospitalization 11/18/2018  with transient facial twitchings of unclear etiology. Presented to ED on 03/23/2019 with tonic-clonic seizure activity with Keppra initiated.       1.  Right MCA infract s/p MCA stent -Intermittent left facial "tightness/pulling" over the past 6 months with mild left facial asymmetry on exam - likely from prior stroke but needs to schedule f/u with Dr. Estanislado Pandy for repeat imaging as prior imaging 07/2020 and recommended 8-monthfollow up imaging not yet completed - will reach out to IR to further assist. Low suspicion symptoms in setting of new or worsening stroke as no other focal deficits appreciated although will be ruled out with f/u imaging with Dr. DEstanislado Pandy Of  note, eval 11/2018 for transient episode of right facial drawing and twitching with full work-up unremarkable. Discussed importance of proceeding to ED immediately with any new or reoccurring stroke/TIA symptoms for further evaluation - Continue Brilinta and restart Crestor 5 mg daily for secondary stroke prevention -Close PCP follow-up for aggressive stroke risk factor management including HTN with BP goal<130/90, HLD with LDL goal<70 and DM with A1c goal<7 - PCP f/u scheduled end of month with plans for repeat labs per pt  2.  Seizure, post stroke -No recurrent seizure type activity or symptoms -Continue taking Keppra 500 mg BID for seizure prophylaxis - refill provided by PCP  3.  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 Estanislado Pandy to undergo dental procedure    Follow-up in 1 year or call earlier if needed   CC:  [place] provider: Dr. Gershon Mussel, Dalbert Batman, MD    I spent 36 minutes of face-to-face and non-face-to-face time with patient assisted by interpreter.  This included previsit chart review, lab review, study  review, order entry, electronic health record documentation, patient education regarding history of right MCA stroke, post stroke seizures, intracranial stenosis, secondary stroke prevention measures and importance of managing stroke risk factors, chronic tension headaches and visual symptoms and answered all other questions to patient satisfaction  Frann Rider, AGNP-BC  [facility] [address] [place], [place] [address]  Phone [phone] Fax [phone] Note: This document was prepared with digital dictation and possible smart phrase technology. Any transcriptional errors that result from this process are unintentional.

## 2021-05-20 ENCOUNTER — Other Ambulatory Visit: Payer: Self-pay

## 2021-05-25 ENCOUNTER — Other Ambulatory Visit: Payer: Self-pay

## 2021-05-25 MED FILL — Diclofenac Sodium Gel 1% (1.16% Diethylamine Equiv): CUTANEOUS | 12 days supply | Qty: 100 | Fill #0 | Status: AC

## 2021-05-31 ENCOUNTER — Other Ambulatory Visit: Payer: Self-pay

## 2021-05-31 ENCOUNTER — Ambulatory Visit: Payer: Self-pay | Attending: Internal Medicine | Admitting: Internal Medicine

## 2021-05-31 DIAGNOSIS — K029 Dental caries, unspecified: Secondary | ICD-10-CM

## 2021-05-31 DIAGNOSIS — E669 Obesity, unspecified: Secondary | ICD-10-CM

## 2021-05-31 DIAGNOSIS — Z1211 Encounter for screening for malignant neoplasm of colon: Secondary | ICD-10-CM

## 2021-05-31 DIAGNOSIS — G40909 Epilepsy, unspecified, not intractable, without status epilepticus: Secondary | ICD-10-CM

## 2021-05-31 DIAGNOSIS — E1169 Type 2 diabetes mellitus with other specified complication: Secondary | ICD-10-CM

## 2021-05-31 DIAGNOSIS — I679 Cerebrovascular disease, unspecified: Secondary | ICD-10-CM

## 2021-05-31 NOTE — Progress Notes (Signed)
Patient ID: Heather [name], female   DOB: 26-Sep-1956, 65 y.o.   MRN: [id number] Virtual Visit via Telephone Note  I connected with Heather [name] on 05/31/2021 at 10:34 a.m by telephone and verified that I am speaking with the correct person using two identifiers  Location: Patient: home Provider: office  Participants: Myself Patient [name] interpreter: [phone], Heather [name]   I discussed the limitations, risks, security and privacy concerns of performing an evaluation and management service by telephone and the availability of in person appointments. I also discussed with the patient that there may be a patient responsible charge related to this service. The patient expressed understanding and agreed to proceed.   History of Present Illness: Hx of DM, HTN, HL,former smoker tob dep, CVA RT MCA territory, s/p thrombectomy followed by stenting of M2 by interventional radiology, sz disorder, depression.  last seen 11/2020.  Purpose of today's visit is chronic ds management.  Pt c/o tooth pain in RT upper jaw.  Tooth has broken off and decayed.  Causing facial and ear pain.  No facial swelling.  Called dentist but was told she needs referral.  DM:  checking BS daily.  Range 100-140. Doing well with eating habits.  Walking almost daily. Compliant with Amaryl  Sz disorder:  compliant with Keppra.  No sz since last visit.  Had recent f/u with neurology.  She recommended getting her back in with interventional radiologist Dr. Particia Lather and was going to facilitate that.  However patient states she but she has not received a call.  She reports compliance with taking Brilinta and Crestor.  No headaches or dizziness at this time.  Outpatient Encounter Medications as of 05/31/2021  Medication Sig   acetaminophen (TYLENOL) 500 MG tablet Take 1,000 mg by mouth every 8 (eight) hours as needed for moderate pain or headache.   Blood Glucose Monitoring Suppl (TRUE METRIX METER) DEVI 1 kit  by Does not apply route 4 (four) times daily.   Cholecalciferol (VITAMIN D3 PO) Take 1 tablet by mouth daily.   diclofenac Sodium (VOLTAREN) 1 % GEL APPLY 2 G TOPICALLY 4 (FOUR) TIMES DAILY.   escitalopram (LEXAPRO) 10 MG tablet TAKE 1 TABLET (10 MG TOTAL) BY MOUTH DAILY.   glimepiride (AMARYL) 1 MG tablet TAKE 1 TABLET BY MOUTH DAILY WITH BREAKFAST.   glucose blood (TRUE METRIX BLOOD GLUCOSE TEST) test strip Use as instructed   levETIRAcetam (KEPPRA) 500 MG tablet TAKE 1 TABLET (500 MG TOTAL) BY MOUTH 2 (TWO) TIMES DAILY.   meclizine (ANTIVERT) 12.5 MG tablet TAKE 1 TABLET (12.5 MG TOTAL) BY MOUTH 2 (TWO) TIMES DAILY AS NEEDED FOR DIZZINESS.   rosuvastatin (CRESTOR) 5 MG tablet Take 1 tablet (5 mg total) by mouth daily at 6 PM.   ticagrelor (BRILINTA) 90 MG TABS tablet TAKE 1 TABLET (90 MG TOTAL) BY MOUTH 2 (TWO) TIMES DAILY.   traMADol (ULTRAM) 50 MG tablet Take by mouth every 6 (six) hours as needed.   TRUEplus Lancets 28G MISC 28 g by Does not apply route QID.   No facility-administered encounter medications on file as of 05/31/2021.        Observations/Objective: No direct observation done as this was a telephone encounter. Lab Results  Component Value Date   HGBA1C 5.2 06/16/2020     Chemistry      Component Value Date/Time   NA 138 03/17/2021 1351   NA 140 06/16/2020 1140   K 4.2 03/17/2021 1351   CL 106 03/17/2021 1351  CO2 25 03/17/2021 1351   BUN 10 03/17/2021 1351   BUN 13 06/16/2020 1140   CREATININE 0.53 03/17/2021 1351   CREATININE 0.53 09/05/2016 1017      Component Value Date/Time   CALCIUM 8.6 (L) 03/17/2021 1351   ALKPHOS 95 06/16/2020 1140   AST 20 06/16/2020 1140   ALT 17 06/16/2020 1140   BILITOT 0.4 06/16/2020 1140     Lab Results  Component Value Date   WBC 7.3 03/17/2021   HGB 14.5 03/17/2021   HCT 45.3 03/17/2021   MCV 94.8 03/17/2021   PLT 285 03/17/2021     Assessment and Plan: 1. Diabetes mellitus type 2 in obese [facility]) Reported  blood sugar readings are at goal.  Continue Amaryl.  Encouraged to continue healthy eating habits and regular exercise.  She will come to the lab later this week for blood test. - Lipid panel; Future - Hemoglobin A1c; Future - Microalbumin / creatinine urine ratio; Future  2. Pain due to dental caries Referral submitted to dentistry.  3. Seizure disorder ([place]) Stable without recurrent seizures.  Continue Keppra  4. Intracranial vascular stenosis I will resubmit referral to the interventional radiologist Dr. Particia Lather for follow-up. - Ambulatory referral to Interventional Radiology  5. Screening for colon cancer - Fecal occult blood, imunochemical([facility]); Future   Follow Up Instructions: 6 wks for PAP   I discussed the assessment and treatment plan with the patient. The patient was provided an opportunity to ask questions and all were answered. The patient agreed with the plan and demonstrated an understanding of the instructions.   The patient was advised to call back or seek an in-person evaluation if the symptoms worsen or if the condition fails to improve as anticipated.  I  Spent 13 minutes on this telephone encounter  Karle Plumber, MD

## 2021-05-31 NOTE — Addendum Note (Signed)
Addended by: Jonah Blue B on: 05/31/2021 03:59 PM   Modules accepted: Orders

## 2021-06-03 NOTE — Addendum Note (Signed)
Addended byMemory Dance on: 06/03/2021 11:09 AM   Modules accepted: Orders

## 2021-06-04 ENCOUNTER — Telehealth: Payer: Self-pay

## 2021-06-04 LAB — LIPID PANEL
Chol/HDL Ratio: 3.1 ratio (ref 0.0–4.4)
Cholesterol, Total: 172 mg/dL (ref 100–199)
HDL: 56 mg/dL (ref >39)
LDL Chol Calc (NIH): 99 mg/dL (ref 0–99)
Triglycerides: 95 mg/dL (ref 0–149)
VLDL Cholesterol Cal: 17 mg/dL (ref 5–40)

## 2021-06-04 LAB — MICROALBUMIN / CREATININE URINE RATIO
Creatinine, Urine: 249.1 mg/dL
Microalb/Creat Ratio: 4 mg/g creat (ref 0–29)
Microalbumin, Urine: 11.1 ug/mL

## 2021-06-04 LAB — HEMOGLOBIN A1C
Est. average glucose Bld gHb Est-mCnc: 108 mg/dL
Hgb A1c MFr Bld: 5.4 % (ref 4.8–5.6)

## 2021-06-04 NOTE — Telephone Encounter (Signed)
Contacted pt to go over lab results pt didn't answer lvm   Sent a CRM and forward labs to NT to give pt labs when they call back   

## 2021-06-04 NOTE — Progress Notes (Signed)
Let patient know that her A1c is 5.4 meaning her diabetes is under good control.  LDL cholesterol is 99 with goal being less than 70.  Make sure that she takes the rosuvastatin every day as prescribed to help lower the cholesterol.

## 2021-06-09 ENCOUNTER — Telehealth: Payer: Self-pay

## 2021-06-09 NOTE — Telephone Encounter (Signed)
Pt has been scheduled and reminder has been mailed.  

## 2021-06-09 NOTE — Telephone Encounter (Signed)
-----   Message from Marcine Matar, MD sent at 05/31/2021 10:47 AM EDT ----- Needs follow-up visit in 6 weeks for Pap smear.

## 2021-06-09 NOTE — Telephone Encounter (Signed)
-----   Message from Marcine Matar, MD sent at 05/31/2021 12:45 PM EDT ----- Give follow-up in 6 weeks for Pap.

## 2021-06-09 NOTE — Telephone Encounter (Signed)
Appt has been made.

## 2021-06-16 ENCOUNTER — Other Ambulatory Visit: Payer: Self-pay | Admitting: Internal Medicine

## 2021-06-16 ENCOUNTER — Other Ambulatory Visit: Payer: Self-pay

## 2021-06-16 DIAGNOSIS — E669 Obesity, unspecified: Secondary | ICD-10-CM

## 2021-06-16 DIAGNOSIS — Z8673 Personal history of transient ischemic attack (TIA), and cerebral infarction without residual deficits: Secondary | ICD-10-CM

## 2021-06-16 DIAGNOSIS — E1169 Type 2 diabetes mellitus with other specified complication: Secondary | ICD-10-CM

## 2021-06-16 MED ORDER — TICAGRELOR 90 MG PO TABS
ORAL_TABLET | ORAL | 3 refills | Status: DC
  Filled 2021-06-16 – 2021-07-15 (×2): qty 60, fill #0
  Filled 2021-08-04: qty 60, 30d supply, fill #0
  Filled 2021-08-31: qty 60, 30d supply, fill #1
  Filled 2021-10-24: qty 60, 30d supply, fill #2
  Filled 2021-10-25: qty 60, 30d supply, fill #0
  Filled 2021-11-18: qty 60, 30d supply, fill #1

## 2021-06-16 MED ORDER — TRUE METRIX METER W/DEVICE KIT
1.0000 | PACK | Freq: Four times a day (QID) | 0 refills | Status: DC
Start: 2021-06-16 — End: 2022-10-07
  Filled 2021-06-16: qty 1, 30d supply, fill #0

## 2021-06-16 NOTE — Telephone Encounter (Signed)
Requested medication (s) are due for refill today: Yes  Requested medication (s) are on the active medication list: Yes  Last refill:  03/23/21  Future visit scheduled: Yes  Notes to clinic:  Unable to refill per protocol, cannot delegate.      Requested Prescriptions  Pending Prescriptions Disp Refills   ticagrelor (BRILINTA) 90 MG TABS tablet 60 tablet 3    Sig: TAKE 1 TABLET (90 MG TOTAL) BY MOUTH 2 (TWO) TIMES DAILY.     Not Delegated - Hematology: Antiplatelets - ticagrelor Failed - 06/16/2021 12:24 PM      Failed - This refill cannot be delegated      Passed - Cr in normal range and within 180 days    Creat  Date Value Ref Range Status  09/05/2016 0.53 0.50 - 0.99 mg/dL Final    Comment:      For patients > or = 66 years of age: The upper reference limit for Creatinine is approximately 13% higher for people identified as African-American.      Creatinine, Ser  Date Value Ref Range Status  03/17/2021 0.53 0.44 - 1.00 mg/dL Final          Passed - HCT in normal range and within 180 days    HCT  Date Value Ref Range Status  03/17/2021 45.3 36.0 - 46.0 % Final   Hematocrit  Date Value Ref Range Status  06/16/2020 43.6 34.0 - 46.6 % Final          Passed - HGB in normal range and within 180 days    Hemoglobin  Date Value Ref Range Status  03/17/2021 14.5 12.0 - 15.0 g/dL Final  85/11/7739 28.7 11.1 - 15.9 g/dL Final          Passed - PLT in normal range and within 180 days    Platelets  Date Value Ref Range Status  03/17/2021 285 150 - 400 K/uL Final  06/16/2020 273 150 - 450 x10E3/uL Final          Passed - Valid encounter within last 6 months    Recent Outpatient Visits           2 weeks ago Diabetes mellitus type 2 in obese [facility])   [facility] Marcine Matar, MD   3 months ago Patient left before evaluation by physician   [facility] Jonah Blue B, MD   7 months ago  Diabetes mellitus type 2 in obese [facility])   [facility] Marcine Matar, MD   11 months ago    [facility] Swords, Valetta Mole, MD   1 year ago Need for influenza vaccination   [facility] Lois Huxley, Cornelius Moras, RPH-CPP       Future Appointments             In 3 weeks Marcine Matar, MD [facility]

## 2021-06-17 ENCOUNTER — Other Ambulatory Visit: Payer: Self-pay

## 2021-06-18 ENCOUNTER — Telehealth (HOSPITAL_COMMUNITY): Payer: Self-pay

## 2021-06-18 ENCOUNTER — Other Ambulatory Visit: Payer: Self-pay

## 2021-06-18 NOTE — Telephone Encounter (Signed)
Called to schedule mra, no answer, left vm. AW  ?

## 2021-06-21 ENCOUNTER — Ambulatory Visit: Payer: Self-pay | Admitting: *Deleted

## 2021-06-21 NOTE — Telephone Encounter (Signed)
Pt reports had oral surgery today, extraction. Had been advised to stop Brilinta 6 days prior to surgery. Last took Tuesday 06/15/21. Pt questioning when she should resume med. Engineer, civil (consulting) advised to check with PCP. CB# [phone] Please advise.       Reason for Disposition  [1] Caller has URGENT medicine question about med that PCP or specialist prescribed AND [2] triager unable to answer question  Answer Assessment - Initial Assessment Questions 1. NAME of MEDICATION: "What medicine are you calling about?"     Brilinta 2. QUESTION: "What is your question?" (e.g., double dose of medicine, side effect)     Restarting after oral surgery 3. PRESCRIBING HCP: "Who prescribed it?" Reason: if prescribed by specialist, call should be referred to that group.     Dr Laural Benes 4. SYMPTOMS: "Do you have any symptoms?"     no 5. SEVERITY: If symptoms are present, ask "Are they mild, moderate or severe?"     *No Answer* 6. PREGNANCY:  "Is there any chance that you are pregnant?" "When was your last menstrual period?"     *No Answer*  Protocols used: Medication Question Call-A-AH

## 2021-06-23 NOTE — Telephone Encounter (Signed)
Via interpreter [phone] Okay to resume Brilinta when bleeding stops per Dr. Henriette Combs note. Bleeding should stop within 24 hours or so.

## 2021-07-06 ENCOUNTER — Ambulatory Visit (HOSPITAL_COMMUNITY)
Admission: RE | Admit: 2021-07-06 | Discharge: 2021-07-06 | Disposition: A | Discharge status: Home / Self Care | Payer: No Typology Code available for payment source | Source: Ambulatory Visit | Attending: Interventional Radiology | Admitting: Interventional Radiology

## 2021-07-06 ENCOUNTER — Other Ambulatory Visit: Payer: Self-pay

## 2021-07-06 DIAGNOSIS — I639 Cerebral infarction, unspecified: Secondary | ICD-10-CM | POA: Insufficient documentation

## 2021-07-07 ENCOUNTER — Telehealth (HOSPITAL_COMMUNITY): Payer: Self-pay

## 2021-07-07 NOTE — Telephone Encounter (Signed)
Called regarding recent imaging, no answer, no vm. AW

## 2021-07-13 ENCOUNTER — Other Ambulatory Visit: Payer: Self-pay

## 2021-07-13 ENCOUNTER — Encounter: Payer: Self-pay | Admitting: Internal Medicine

## 2021-07-13 ENCOUNTER — Other Ambulatory Visit: Payer: Self-pay | Admitting: Pharmacist

## 2021-07-13 ENCOUNTER — Other Ambulatory Visit (HOSPITAL_COMMUNITY)
Admission: RE | Admit: 2021-07-13 | Discharge: 2021-07-13 | Disposition: A | Discharge status: Home / Self Care | Payer: No Typology Code available for payment source | Source: Ambulatory Visit | Attending: Internal Medicine | Admitting: Internal Medicine

## 2021-07-13 ENCOUNTER — Ambulatory Visit: Payer: Self-pay | Attending: Internal Medicine | Admitting: Internal Medicine

## 2021-07-13 VITALS — BP 121/77 | HR 70 | Resp 16 | Wt 245.6 lb

## 2021-07-13 DIAGNOSIS — Z1231 Encounter for screening mammogram for malignant neoplasm of breast: Secondary | ICD-10-CM

## 2021-07-13 DIAGNOSIS — I669 Occlusion and stenosis of unspecified cerebral artery: Secondary | ICD-10-CM

## 2021-07-13 DIAGNOSIS — Z124 Encounter for screening for malignant neoplasm of cervix: Secondary | ICD-10-CM | POA: Insufficient documentation

## 2021-07-13 DIAGNOSIS — E1169 Type 2 diabetes mellitus with other specified complication: Secondary | ICD-10-CM

## 2021-07-13 DIAGNOSIS — Z1211 Encounter for screening for malignant neoplasm of colon: Secondary | ICD-10-CM

## 2021-07-13 DIAGNOSIS — Z23 Encounter for immunization: Secondary | ICD-10-CM

## 2021-07-13 MED ORDER — TRUE METRIX BLOOD GLUCOSE TEST VI STRP
ORAL_STRIP | 2 refills | Status: DC
Start: 2021-07-13 — End: 2022-11-04
  Filled 2021-07-13 – 2021-08-04 (×2): qty 100, 25d supply, fill #0
  Filled 2021-08-31: qty 100, 25d supply, fill #1
  Filled 2021-09-06: qty 200, 50d supply, fill #1

## 2021-07-13 MED ORDER — TRUEPLUS LANCETS 28G MISC
28.0000 g | Freq: Four times a day (QID) | 3 refills | Status: DC
  Filled 2021-07-13 – 2021-08-04 (×2): qty 100, 25d supply, fill #0
  Filled 2021-08-31: qty 100, 25d supply, fill #1
  Filled 2021-09-06: qty 200, 50d supply, fill #1
  Filled 2021-10-24: qty 200, 50d supply, fill #2
  Filled 2021-10-25: qty 100, 25d supply, fill #0

## 2021-07-13 NOTE — Progress Notes (Signed)
Patient ID: Heather [name], female    DOB: 04-Oct-1955  MRN: [id number]  CC: Gynecologic Exam   Subjective: Heather [name] is a 65 y.o. female who presents for PAP Her concerns today include:  Hx of DM, HTN, HL,former smoker tob dep, CVA RT MCA territory, s/p thrombectomy followed by stenting of M2 by interventional radiology, sz disorder, depression  GYN History:  Pt is G2P1 (one miscarriage) Any hx of abn paps?: no Menses regular or irregular?: menopausal How long does menses last? NA Menstrual flow light or heavy?: NA Method of birth control?: NA Any vaginal dischg at this time?: no Dysuria?: no Any hx of STI?: no Sexually active with how many partners: not active in yrs Desires STI screen: no Last MMG: over due.  Ordered 11/2020 Family hx of uterine, cervical or breast cancer?:  no  HM: did fit test and  mailed in.  We did not receive it.  Willing to do it again.  Needs 2 mth RF on meds as she will be going to visit family in [place] for 2 mths. Had MRA of the head on the fourth of this month as ordered by Dr. Estanislado Pandy.  Patient wanting to know results.  They tried to call her with results on 07/07/2021. Patient Active Problem List   Diagnosis Date Noted   Stenosis of artery ([place]) 11/13/2020   Major depression in remission ([place]) 06/16/2020   Class 2 severe obesity with serious comorbidity and body mass index (BMI) of 35.0 to 35.9 in adult [facility]) 06/16/2020   Stenosis of intracranial vessel 06/16/2020   Seizures ([place]) 03/23/2019   TIA (transient ischemic attack) 11/18/2018   Depression 02/21/2018   Essential hypertension 02/21/2018   Cerebrovascular accident (CVA) due to embolism of right middle cerebral artery ([place]) 02/21/2018   Diabetes mellitus type 2 in obese ([place]) 12/20/2017   Right middle cerebral artery stroke ([place]) 12/19/2017   Left hemiparesis ([place])    Dysphagia, oropharyngeal    Middle cerebral artery embolism, 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      Current Outpatient Medications on File Prior to Visit  Medication Sig Dispense Refill   acetaminophen (TYLENOL) 500 MG tablet Take 1,000 mg by mouth every 8 (eight) hours as needed for moderate pain or headache.     Blood Glucose Monitoring Suppl (TRUE METRIX METER) w/Device KIT 1 kit by Does not apply route 4 (four) times daily. 1 kit 0   Cholecalciferol (VITAMIN D3 PO) Take 1 tablet by mouth daily.     diclofenac Sodium (VOLTAREN) 1 % GEL APPLY 2 G TOPICALLY 4 (FOUR) TIMES DAILY. 100 g 1   escitalopram (LEXAPRO) 10 MG tablet TAKE 1 TABLET (10 MG TOTAL) BY MOUTH DAILY. 30 tablet 3   glimepiride (AMARYL) 1 MG tablet TAKE 1 TABLET BY MOUTH DAILY WITH BREAKFAST. 30 tablet 5   glucose blood (TRUE METRIX BLOOD GLUCOSE TEST) test strip Use as instructed 100 each 11   levETIRAcetam (KEPPRA) 500 MG tablet TAKE 1 TABLET (500 MG TOTAL) BY MOUTH 2 (TWO) TIMES DAILY. 180 tablet 3   meclizine (ANTIVERT) 12.5 MG tablet TAKE 1 TABLET (12.5 MG TOTAL) BY MOUTH 2 (TWO) TIMES DAILY AS NEEDED FOR DIZZINESS. 30 tablet 2   rosuvastatin (CRESTOR) 5 MG tablet Take 1 tablet (5 mg total) by mouth daily at 6 PM. 90 tablet 0   ticagrelor (BRILINTA) 90 MG TABS tablet TAKE 1 TABLET (90 MG TOTAL) BY MOUTH 2 (TWO) TIMES DAILY. 60 tablet 3   traMADol (ULTRAM)  50 MG tablet Take by mouth every 6 (six) hours as needed.     TRUEplus Lancets 28G MISC 28 g by Does not apply route QID. 120 each 3   No current facility-administered medications on file prior to visit.    No Known Allergies  Social History   Socioeconomic History   Marital status: Single    Spouse name: Not on file   Number of children: Not on file   Years of education: Not on file   Highest education level: Not on file  Occupational History   Not on file  Tobacco Use   Smoking status: Former    Packs/day: 0.50    Years: 30.00    Pack years: 15.00    Types: Cigarettes    Quit date: 12/04/2017    Years since quitting: 3.6   Smokeless tobacco: Never   Vaping Use   Vaping Use: Never used  Substance and Sexual Activity   Alcohol use: Not Currently    Comment: weekly   Drug use: No   Sexual activity: Not Currently    Birth control/protection: None  Other Topics Concern   Not on file  Social History Narrative   Not on file   Social Determinants of Health   Financial Resource Strain: Not on file  Food Insecurity: Not on file  Transportation Needs: Not on file  Physical Activity: Not on file  Stress: Not on file  Social Connections: Not on file  Intimate Partner Violence: Not on file    Family History  Problem Relation Age of Onset   Diabetes Mother    Pulmonary disease Mother    Heart disease Father    Stroke Sister     Past Surgical History:  Procedure Laterality Date   BREAST EXCISIONAL BIOPSY     BREAST SURGERY     right breast   CHOLECYSTECTOMY     IR ANGIO INTRA EXTRACRAN SEL COM CAROTID INNOMINATE BILAT MOD SED  03/06/2018   IR ANGIO INTRA EXTRACRAN SEL COM CAROTID INNOMINATE BILAT MOD SED  11/20/2018   IR ANGIO INTRA EXTRACRAN SEL COM CAROTID INNOMINATE BILAT MOD SED  10/31/2019   IR ANGIO VERTEBRAL SEL VERTEBRAL BILAT MOD SED  03/06/2018   IR ANGIO VERTEBRAL SEL VERTEBRAL BILAT MOD SED  11/20/2018   IR ANGIO VERTEBRAL SEL VERTEBRAL BILAT MOD SED  10/31/2019   IR CT HEAD LTD  12/18/2017   IR INTRA CRAN STENT  12/18/2017   IR PERCUTANEOUS ART THROMBECTOMY/INFUSION INTRACRANIAL INC DIAG ANGIO  12/18/2017   IR RADIOLOGIST EVAL & MGMT  01/31/2018   IR US GUIDE VASC ACCESS RIGHT  10/31/2019   RADIOLOGY WITH ANESTHESIA N/A 12/18/2017   Procedure: RADIOLOGY WITH ANESTHESIA;  Surgeon: Luanne Bras, MD;  Location: [facility];  Service: Radiology;  Laterality: N/A;    ROS: Review of Systems Negative except as stated above  PHYSICAL EXAM: BP 121/77   Pulse 70   Resp 16   Wt 245 lb 9.6 oz (111.4 kg)   SpO2 99%   BMI 38.47 kg/m   Physical Exam  General appearance - alert, well appearing, and in no distress Mental  status - normal mood, behavior, speech, dress, motor activity, and thought processes Breasts -CMA Claiborne Billings Fiscal was present for breast and pelvic exam:  present for breast and pelvic exam: breasts appear normal, no suspicious masses, no skin or nipple changes or axillary nodes Pelvic - normal external genitalia, vulva, vagina, cervix, uterus and adnexa   CMP Latest Ref Rng &  Units 03/17/2021 06/16/2020 10/31/2019  Glucose 70 - 99 mg/dL 128(H) 93 100(H)  BUN 8 - 23 mg/dL _0 Creatinine 0.44 - 1.00 mg/dL 0.53 0.53(L) 0.30(L)  Sodium 135 - 145 mmol/L 138 140 143  Potassium 3.5 - 5.1 mmol/L 4.2 4.8 4.5  Chloride 98 - 111 mmol/L 106 103 108  CO2 22 - 32 mmol/L 25 24 -  Calcium 8.9 - 10.3 mg/dL 8.6(L) 9.3 -  Total Protein 6.0 - 8.5 g/dL - 6.9 -  Total Bilirubin 0.0 - 1.2 mg/dL - 0.4 -  Alkaline Phos 44 - 121 IU/L - 95 -  AST 0 - 40 IU/L - 20 -  ALT 0 - 32 IU/L - 17 -   Lipid Panel     Component Value Date/Time   CHOL 172 06/03/2021 1111   TRIG 95 06/03/2021 1111   HDL 56 06/03/2021 1111   CHOLHDL 3.1 06/03/2021 1111   CHOLHDL 2.5 03/24/2019 0611   VLDL 15 03/24/2019 0611   LDLCALC 99 06/03/2021 1111    CBC    Component Value Date/Time   WBC 7.3 03/17/2021 1351   RBC 4.78 03/17/2021 1351   HGB 14.5 03/17/2021 1351   HGB 14.1 06/16/2020 1140   HCT 45.3 03/17/2021 1351   HCT 43.6 06/16/2020 1140   PLT 285 03/17/2021 1351   PLT 273 06/16/2020 1140   MCV 94.8 03/17/2021 1351   MCV 91 06/16/2020 1140   MCH 30.3 03/17/2021 1351   MCHC 32.0 03/17/2021 1351   RDW 13.2 03/17/2021 1351   RDW 12.4 06/16/2020 1140   LYMPHSABS 2.1 03/17/2021 1351   MONOABS 0.5 03/17/2021 1351   EOSABS 0.1 03/17/2021 1351   BASOSABS 0.0 03/17/2021 1351    ASSESSMENT AND PLAN: 1. Pap smear for cervical cancer screening - Cytology - PAP  2. Stenosis of intracranial vessel I went over MRA of the head results report with her.  It showed stable intracranial MRA with patent vascular stent in place  and no other large vessel occlusion or other new and or progressive findings.  3. Need for immunization against influenza - Flu Vaccine QUAD 40moIM (Fluarix, Fluzone & Alfiuria Quad PF)  4. Encounter for screening mammogram for malignant neoplasm of breast - MM Digital Screening; Future  5. Screening for colon cancer Patient will be given new kit today.  Have encouraged her to try to provide the sample while she is here today and turn it into the lab.   - Fecal occult blood, imunochemical([facility])     Patient was given the opportunity to ask questions.  Patient verbalized understanding of the plan and was able to repeat key elements of the plan.  [employer] Language interpreter used during this encounter. ##[id number] Tania  Orders Placed This Encounter  Procedures   Flu Vaccine QUAD 66moM (Fluarix, Fluzone & Alfiuria Quad PF)     Requested Prescriptions    No prescriptions requested or ordered in this encounter    No follow-ups on file.  DeKarle PlumberMD, FACP

## 2021-07-14 LAB — CYTOLOGY - PAP
Comment: NEGATIVE
Diagnosis: NEGATIVE
High risk HPV: NEGATIVE

## 2021-07-15 ENCOUNTER — Telehealth: Payer: Self-pay

## 2021-07-15 ENCOUNTER — Other Ambulatory Visit: Payer: Self-pay | Admitting: Internal Medicine

## 2021-07-15 ENCOUNTER — Other Ambulatory Visit: Payer: Self-pay

## 2021-07-15 LAB — FECAL OCCULT BLOOD, IMMUNOCHEMICAL: Fecal Occult Bld: NEGATIVE

## 2021-07-15 MED ORDER — ESCITALOPRAM OXALATE 10 MG PO TABS
ORAL_TABLET | Freq: Every day | ORAL | 5 refills | Status: DC
  Filled 2021-07-15: qty 30, 30d supply, fill #0
  Filled 2021-08-04: qty 30, 30d supply, fill #1
  Filled 2021-08-31: qty 30, 30d supply, fill #2
  Filled 2021-09-06: qty 60, 60d supply, fill #2
  Filled 2021-10-24: qty 60, 60d supply, fill #3
  Filled 2021-10-25: qty 30, 30d supply, fill #0
  Filled 2021-11-18: qty 30, 30d supply, fill #1

## 2021-07-15 NOTE — Telephone Encounter (Signed)
Contacted pt to go over lab results pt didn't answer lvm [employer] interperter: Rafel ID: [id number]   Sent a CRM and forward labs to NT to give pt labs when they call back

## 2021-07-15 NOTE — Telephone Encounter (Signed)
Requested Prescriptions  Pending Prescriptions Disp Refills  . escitalopram (LEXAPRO) 10 MG tablet 30 tablet 5    Sig: TAKE 1 TABLET (10 MG TOTAL) BY MOUTH DAILY.     Psychiatry:  Antidepressants - SSRI Passed - 07/15/2021  9:42 AM      Passed - Completed PHQ-2 or PHQ-9 in the last 360 days      Passed - Valid encounter within last 6 months    Recent Outpatient Visits          2 days ago Pap smear for cervical cancer screening   [place] [facility] Marcine Matar, MD   1 month ago Diabetes mellitus type 2 in obese [facility])   [place] [facility] Marcine Matar, MD   4 months ago Patient left before evaluation by physician   [facility] Marcine Matar, MD   8 months ago Diabetes mellitus type 2 in obese [facility])   [facility] Marcine Matar, MD   12 months ago    [employer] [facility], Valetta Mole, MD

## 2021-07-16 ENCOUNTER — Other Ambulatory Visit: Payer: Self-pay

## 2021-07-20 ENCOUNTER — Other Ambulatory Visit: Payer: Self-pay

## 2021-07-21 ENCOUNTER — Other Ambulatory Visit: Payer: Self-pay

## 2021-08-04 ENCOUNTER — Other Ambulatory Visit: Payer: Self-pay

## 2021-08-06 ENCOUNTER — Other Ambulatory Visit: Payer: Self-pay

## 2021-08-31 ENCOUNTER — Other Ambulatory Visit: Payer: Self-pay

## 2021-08-31 ENCOUNTER — Other Ambulatory Visit: Payer: Self-pay | Admitting: Internal Medicine

## 2021-08-31 ENCOUNTER — Telehealth: Payer: Self-pay | Admitting: Internal Medicine

## 2021-08-31 NOTE — Telephone Encounter (Signed)
Patient returning call regarding her lab and colonoscopy results

## 2021-08-31 NOTE — Telephone Encounter (Deleted)
ERROR

## 2021-09-01 NOTE — Telephone Encounter (Signed)
Unclear if pt needs refills on all of these medications or just the one.

## 2021-09-01 NOTE — Telephone Encounter (Signed)
Medication Refill - Medication: acetaminophen (TYLENOL) 500 MG tablet Blood Glucose Monitoring Suppl (TRUE METRIX METER) w/Device KIT Cholecalciferol (VITAMIN D3 PO) diclofenac Sodium (VOLTAREN) 1 % GEL escitalopram (LEXAPRO) 10 MG tablet glimepiride (AMARYL) 1 MG tablet glucose blood (TRUE METRIX BLOOD GLUCOSE TEST) test strip levETIRAcetam (KEPPRA) 500 MG tablet meclizine (ANTIVERT) 12.5 MG tablet rosuvastatin (CRESTOR) 5 MG tablet ticagrelor (BRILINTA) 90 MG TABS tablet traMADol (ULTRAM) 50 MG tablet TRUEplus Lancets 28G MISC Patient is requesting 3 month of medication due to [place].  Has the patient contacted their pharmacy? Yes.   (Agent: If no, request that the patient contact the pharmacy for the refill. If patient does not wish to contact the pharmacy document the reason why and proceed with request.) (Agent: If yes, when and what did the pharmacy advise?)  Preferred Pharmacy (with phone number or street name): [place] [facility] [address] Has the patient been seen for an appointment in the last year OR does the patient have an upcoming appointment? Yes.    Agent: Please be advised that RX refills may take up to 3 business days. We ask that you follow-up with your pharmacy.

## 2021-09-01 NOTE — Telephone Encounter (Signed)
Attempted to CB patient, "Voice mail not set up." Assisted by [employer]  # [phone]

## 2021-09-03 ENCOUNTER — Telehealth: Payer: Self-pay | Admitting: Internal Medicine

## 2021-09-03 ENCOUNTER — Other Ambulatory Visit: Payer: Self-pay

## 2021-09-03 MED ORDER — GLIMEPIRIDE 1 MG PO TABS
ORAL_TABLET | Freq: Every day | ORAL | 5 refills | Status: DC
  Filled 2021-09-03: qty 30, 30d supply, fill #0
  Filled 2021-09-06: qty 60, 60d supply, fill #0
  Filled 2021-10-24: qty 60, 60d supply, fill #1
  Filled 2021-10-25: qty 30, 30d supply, fill #0
  Filled 2021-11-18: qty 30, 30d supply, fill #1
  Filled 2021-12-19: qty 30, 30d supply, fill #2
  Filled 2022-01-19: qty 30, 30d supply, fill #3

## 2021-09-03 NOTE — Telephone Encounter (Signed)
Copied from CRM [id number]. Topic: General - Inquiry >> Sep 01, 2021 11:25 AM Louie Bun, Rosey Bath D wrote: Reason for CRM: Patient called and would like to get he last recent lab results for stool for colon. Please call patient, thanks.

## 2021-09-03 NOTE — Telephone Encounter (Signed)
Requested Prescriptions  Pending Prescriptions Disp Refills  . glimepiride (AMARYL) 1 MG tablet 30 tablet 5    Sig: TAKE 1 TABLET BY MOUTH DAILY WITH BREAKFAST.     Endocrinology:  Diabetes - Sulfonylureas Passed - 09/03/2021 12:37 PM      Passed - HBA1C is between 0 and 7.9 and within 180 days    HbA1c, POC (prediabetic range)  Date Value Ref Range Status  02/20/2018 6.3 5.7 - 6.4 % Final   HbA1c POC (<> result, manual entry)  Date Value Ref Range Status  05/08/2018 5.5 4.0 - 5.6 % Final   Hgb A1c MFr Bld  Date Value Ref Range Status  06/03/2021 5.4 4.8 - 5.6 % Final    Comment:             Prediabetes: 5.7 - 6.4          Diabetes: >6.4          Glycemic control for adults with diabetes: <7.0          Passed - Valid encounter within last 6 months    Recent Outpatient Visits          1 month ago Pap smear for cervical cancer screening   [place] [facility] Jonah Blue B, MD   3 months ago Diabetes mellitus type 2 in obese [facility])   [place] [facility] Marcine Matar, MD   6 months ago Patient left before evaluation by physician   [facility] Marcine Matar, MD   9 months ago Diabetes mellitus type 2 in obese [facility])   [facility] Marcine Matar, MD   1 year ago    [facility], Valetta Mole, MD

## 2021-09-03 NOTE — Telephone Encounter (Signed)
NO pt does not need ALL those meds. meclizine (ANTIVERT) 12.5 MG tablet glimepiride (AMARYL) 1 MG tablet levETIRAcetam (KEPPRA) 500 MG tablet ticagrelor (BRILINTA) 90 MG TABS tablet  Pt needs at least 2 month supply, as she will be [place] the [place].  Pt states she asked the dr to do this and she said she would.  [facility] [employer]

## 2021-09-06 ENCOUNTER — Other Ambulatory Visit: Payer: Self-pay

## 2021-09-08 ENCOUNTER — Telehealth: Payer: Self-pay | Admitting: Internal Medicine

## 2021-09-08 ENCOUNTER — Other Ambulatory Visit: Payer: Self-pay

## 2021-09-08 MED ORDER — MECLIZINE HCL 12.5 MG PO TABS: 12.5000 mg | ORAL_TABLET | Freq: Two times a day (BID) | ORAL | 2 refills | Status: DC | PRN

## 2021-09-08 NOTE — Telephone Encounter (Signed)
Medication Refill - Medication: meclizine (ANTIVERT) 12.5 MG tablet   Pt is completely out of her current supply   Has the patient contacted their pharmacy? Yes.   (Agent: If no, request that the patient contact the pharmacy for the refill. If patient does not wish to contact the pharmacy document the reason why and proceed with request.) (Agent: If yes, when and what did the pharmacy advise?)  Preferred Pharmacy (with phone number or street name):  [facility] [address] [place] - [address]  [address] [place] [address]  Phone: [phone] Fax: [phone]   Has the patient been seen for an appointment in the last year OR does the patient have an upcoming appointment? Yes.    Agent: Please be advised that RX refills may take up to 3 business days. We ask that you follow-up with your pharmacy.

## 2021-10-22 ENCOUNTER — Other Ambulatory Visit: Payer: Self-pay | Admitting: Internal Medicine

## 2021-10-22 NOTE — Telephone Encounter (Signed)
Requested medication (s) are due for refill today: yes  Requested medication (s) are on the active medication list: yes  Last refill:  09/08/21  Future visit scheduled: no  Notes to clinic:  Unable to refill per protocol, cannot delegate.      Requested Prescriptions  Pending Prescriptions Disp Refills   meclizine (ANTIVERT) 12.5 MG tablet [Pharmacy Med Name: Meclizine HCl 12.5 MG Oral Tablet] 30 tablet 0    Sig: TAKE 1 TABLET BY MOUTH TWICE DAILY AS NEEDED FOR DIZZINESS     Not Delegated - Gastroenterology: Antiemetics Failed - 10/22/2021 12:10 PM      Failed - This refill cannot be delegated      Passed - Valid encounter within last 6 months    Recent Outpatient Visits           3 months ago Pap smear for cervical cancer screening   [place] [facility] Jonah Blue B, MD   4 months ago Diabetes mellitus type 2 in obese [facility])   [place] [facility] Marcine Matar, MD   8 months ago Patient left before evaluation by physician   [facility] Marcine Matar, MD   11 months ago Diabetes mellitus type 2 in obese [facility])   [place] [facility] Marcine Matar, MD   1 year ago    [facility], Valetta Mole, MD

## 2021-10-24 ENCOUNTER — Other Ambulatory Visit: Payer: Self-pay | Admitting: Internal Medicine

## 2021-10-24 MED ORDER — MECLIZINE HCL 12.5 MG PO TABS
12.5000 mg | ORAL_TABLET | Freq: Two times a day (BID) | ORAL | 3 refills | Status: DC | PRN
  Filled 2021-10-24 – 2021-12-27 (×5): qty 30, 15d supply, fill #0

## 2021-10-25 ENCOUNTER — Other Ambulatory Visit: Payer: Self-pay

## 2021-10-26 ENCOUNTER — Other Ambulatory Visit: Payer: Self-pay

## 2021-11-04 ENCOUNTER — Ambulatory Visit: Payer: Self-pay | Attending: Physician Assistant | Admitting: Physician Assistant

## 2021-11-04 ENCOUNTER — Other Ambulatory Visit: Payer: Self-pay

## 2021-11-04 ENCOUNTER — Encounter: Payer: Self-pay | Admitting: Physician Assistant

## 2021-11-04 ENCOUNTER — Other Ambulatory Visit: Payer: Self-pay | Admitting: Internal Medicine

## 2021-11-04 VITALS — BP 105/75 | HR 78 | Resp 16 | Wt 240.8 lb

## 2021-11-04 DIAGNOSIS — N644 Mastodynia: Secondary | ICD-10-CM

## 2021-11-04 DIAGNOSIS — E669 Obesity, unspecified: Secondary | ICD-10-CM

## 2021-11-04 DIAGNOSIS — E1169 Type 2 diabetes mellitus with other specified complication: Secondary | ICD-10-CM

## 2021-11-04 LAB — GLUCOSE, POCT (MANUAL RESULT ENTRY): POC Glucose: 136 mg/dl — AB (ref 70–99)

## 2021-11-04 NOTE — Progress Notes (Signed)
Patient ID: Heather [name], female   DOB: 24-Nov-1955, 66 y.o.   MRN: [id number]    Heather [name], is a 66 y.o. female  AUQ:[id number]  [id number]  DOB - 1956-01-09  Chief Complaint  Patient presents with   Breast Pain       Subjective:   Heather [name] is a 66 y.o. female here today with R breast pain for the last few days and is overdue for a mammogram.  She signed the papers for mammogram scholarship in October but they are behind in scheduling. No lump or mass  No problems updated.  ALLERGIES: No Known Allergies  PAST MEDICAL HISTORY: Past Medical History:  Diagnosis Date   Depression    Diabetes ([place])    Hypertension    Seizure ([place])    Stroke [facility])     MEDICATIONS AT HOME: Prior to Admission medications   Medication Sig Start Date End Date Taking? Authorizing Provider  acetaminophen (TYLENOL) 500 MG tablet Take 1,000 mg by mouth every 8 (eight) hours as needed for moderate pain or headache.   Yes [provider]  Blood Glucose Monitoring Suppl (TRUE METRIX METER) w/Device KIT 1 kit by Does not apply route 4 (four) times daily. 06/16/21  Yes Ladell Pier, MD  Cholecalciferol (VITAMIN D3 PO) Take 1 tablet by mouth daily.   Yes [provider]  diclofenac Sodium (VOLTAREN) 1 % GEL APPLY 2 G TOPICALLY 4 (FOUR) TIMES DAILY. 11/13/20 11/13/21 Yes Ladell Pier, MD  escitalopram (LEXAPRO) 10 MG tablet TAKE 1 TABLET (10 MG TOTAL) BY MOUTH DAILY. 07/15/21 07/15/22 Yes Ladell Pier, MD  glimepiride (AMARYL) 1 MG tablet TAKE 1 TABLET BY MOUTH DAILY WITH BREAKFAST. 09/03/21 09/03/22 Yes Ladell Pier, MD  glucose blood (TRUE METRIX BLOOD GLUCOSE TEST) test strip Use as instructed 07/13/21  Yes Ladell Pier, MD  levETIRAcetam (KEPPRA) 500 MG tablet TAKE 1 TABLET (500 MG TOTAL) BY MOUTH 2 (TWO) TIMES DAILY. 03/23/21 03/23/22 Yes Ladell Pier, MD  meclizine (ANTIVERT) 12.5 MG tablet Take 1 tablet (12.5 mg total)  by mouth 2 (two) times daily as needed for dizziness. 10/24/21  Yes Ladell Pier, MD  ticagrelor (BRILINTA) 90 MG TABS tablet TAKE 1 TABLET (90 MG TOTAL) BY MOUTH 2 (TWO) TIMES DAILY. 06/16/21  Yes Ladell Pier, MD  traMADol (ULTRAM) 50 MG tablet Take by mouth every 6 (six) hours as needed.   Yes [provider]  TRUEplus Lancets 28G MISC Use 4 times daily 07/13/21  Yes Ladell Pier, MD  rosuvastatin (CRESTOR) 5 MG tablet Take 1 tablet (5 mg total) by mouth daily at 6 PM. Patient not taking: Reported on 11/04/2021 05/13/21   Frann Rider, NP    ROS: Neg HEENT Neg resp Neg cardiac Neg GI Neg GU Neg MS Neg psych Neg neuro  Objective:   Vitals:   11/04/21 0845  BP: 105/75  Pulse: 78  Resp: 16  SpO2: 95%  Weight: 240 lb 12.8 oz (109.2 kg)   Exam General appearance : Awake, alert, not in any distress. Speech Clear. Not toxic looking HEENT: Atraumatic and Normocephalic, pupils equally reactive to light and accomodation Neck: Supple, no JVD. No cervical lymphadenopathy.  Chest: Good air entry bilaterally, CTAB.  No rales/rhonchi/wheezing CVS: S1 S2 regular, no murmurs.  R breast-no lump or mass and no lump or mass in R axilla Extremities: B/L Lower Ext shows no edema, both legs are warm to touch Neurology: Awake alert,  and oriented X 3, CN II-XII intact, Non focal Skin: No Rash  Data Review Lab Results  Component Value Date   HGBA1C 5.4 06/03/2021   HGBA1C 5.2 06/16/2020   HGBA1C 5.4 08/23/2019    Assessment & Plan   1. Diabetes mellitus type 2 in obese (HCC) Continue current regimen - Glucose (CBG)  2. Breast pain Patient to call for mammogram.  Order has already been placed.     Patient have been counseled extensively about nutrition and exercise. Other issues discussed during this visit include: low cholesterol diet, weight control and daily exercise, foot care, annual eye examinations at Ophthalmology, importance of adherence with  medications and regular follow-up. We also discussed long term complications of uncontrolled diabetes and hypertension.   Return for keep appt with Dr Wynetta Emery.  The patient was given clear instructions to go to ER or return to medical center if symptoms don't improve, worsen or new problems develop. The patient verbalized understanding. The patient was told to call to get lab results if they haven't heard anything in the next week.      Freeman Caldron, PA-C [facility] and [facility] [place], [place]   11/04/2021, 8:55 AM

## 2021-11-04 NOTE — Patient Instructions (Addendum)
[phone] [phone] for mammogram

## 2021-11-08 ENCOUNTER — Ambulatory Visit: Payer: Self-pay | Attending: Internal Medicine

## 2021-11-08 ENCOUNTER — Other Ambulatory Visit: Payer: Self-pay

## 2021-11-08 ENCOUNTER — Other Ambulatory Visit: Payer: Self-pay | Admitting: Internal Medicine

## 2021-11-08 DIAGNOSIS — E1169 Type 2 diabetes mellitus with other specified complication: Secondary | ICD-10-CM

## 2021-11-09 ENCOUNTER — Other Ambulatory Visit: Payer: Self-pay

## 2021-11-09 MED ORDER — TRUEPLUS LANCETS 28G MISC
28.0000 g | Freq: Four times a day (QID) | 3 refills | Status: DC
  Filled 2021-11-09: qty 120, 30d supply, fill #0
  Filled 2021-11-18: qty 100, 25d supply, fill #0
  Filled 2022-03-14: qty 100, 25d supply, fill #1
  Filled 2022-04-07: qty 100, 25d supply, fill #2
  Filled 2022-05-18: qty 100, 25d supply, fill #3
  Filled 2022-06-11: qty 100, 25d supply, fill #4

## 2021-11-18 ENCOUNTER — Other Ambulatory Visit: Payer: Self-pay

## 2021-11-22 ENCOUNTER — Other Ambulatory Visit: Payer: No Typology Code available for payment source

## 2021-11-22 ENCOUNTER — Encounter: Payer: Self-pay | Admitting: Internal Medicine

## 2021-11-22 ENCOUNTER — Other Ambulatory Visit: Payer: Self-pay

## 2021-11-22 ENCOUNTER — Ambulatory Visit: Payer: Self-pay | Attending: Internal Medicine | Admitting: Internal Medicine

## 2021-11-22 VITALS — BP 120/84 | HR 76 | Resp 16 | Wt 244.2 lb

## 2021-11-22 DIAGNOSIS — E1169 Type 2 diabetes mellitus with other specified complication: Secondary | ICD-10-CM

## 2021-11-22 DIAGNOSIS — G8194 Hemiplegia, unspecified affecting left nondominant side: Secondary | ICD-10-CM

## 2021-11-22 DIAGNOSIS — I69398 Other sequelae of cerebral infarction: Secondary | ICD-10-CM

## 2021-11-22 DIAGNOSIS — M25562 Pain in left knee: Secondary | ICD-10-CM

## 2021-11-22 DIAGNOSIS — L309 Dermatitis, unspecified: Secondary | ICD-10-CM

## 2021-11-22 DIAGNOSIS — G8929 Other chronic pain: Secondary | ICD-10-CM

## 2021-11-22 DIAGNOSIS — M79672 Pain in left foot: Secondary | ICD-10-CM

## 2021-11-22 DIAGNOSIS — N644 Mastodynia: Secondary | ICD-10-CM

## 2021-11-22 DIAGNOSIS — R569 Unspecified convulsions: Secondary | ICD-10-CM

## 2021-11-22 DIAGNOSIS — M25561 Pain in right knee: Secondary | ICD-10-CM

## 2021-11-22 DIAGNOSIS — Z6838 Body mass index (BMI) 38.0-38.9, adult: Secondary | ICD-10-CM

## 2021-11-22 DIAGNOSIS — F325 Major depressive disorder, single episode, in full remission: Secondary | ICD-10-CM

## 2021-11-22 LAB — POCT GLYCOSYLATED HEMOGLOBIN (HGB A1C): HbA1c, POC (controlled diabetic range): 5.5 % (ref 0.0–7.0)

## 2021-11-22 MED ORDER — DICLOFENAC SODIUM 1 % EX GEL
2.0000 g | Freq: Two times a day (BID) | CUTANEOUS | 1 refills | Status: DC | PRN
  Filled 2021-11-22: qty 100, 25d supply, fill #0
  Filled 2021-12-06: qty 100, 12d supply, fill #0
  Filled 2021-12-19: qty 100, 12d supply, fill #1

## 2021-11-22 MED ORDER — PRAVASTATIN SODIUM 10 MG PO TABS
ORAL_TABLET | ORAL | 6 refills | Status: DC
  Filled 2021-11-22 – 2021-12-06 (×2): qty 30, 30d supply, fill #0
  Filled 2021-12-19: qty 12, 28d supply, fill #1
  Filled 2022-01-19: qty 12, 28d supply, fill #2
  Filled 2022-02-24: qty 12, 28d supply, fill #3
  Filled 2022-03-14: qty 12, 28d supply, fill #4
  Filled 2022-04-07: qty 12, 28d supply, fill #5
  Filled 2022-04-18 – 2022-05-18 (×2): qty 12, 28d supply, fill #6
  Filled 2022-06-11: qty 12, 28d supply, fill #7
  Filled 2022-06-30 – 2022-07-12 (×2): qty 12, 28d supply, fill #8
  Filled 2022-08-09: qty 12, 28d supply, fill #9

## 2021-11-22 MED ORDER — TRIAMCINOLONE ACETONIDE 0.1 % EX CREA
1.0000 "application " | TOPICAL_CREAM | Freq: Two times a day (BID) | CUTANEOUS | 0 refills | Status: DC
  Filled 2021-11-22 – 2021-12-06 (×2): qty 30, 15d supply, fill #0

## 2021-11-22 NOTE — [facility] Notes (Signed)
Patient ID: Heather [name], female    DOB: 01/09/1956  MRN: [id number]  CC: Diabetes   Subjective: Heather [name] is a 66 y.o. female who presents for chronic ds management Her concerns today include:  Hx of DM, HTN, HL,former smoker tob dep, CVA RT MCA territory, s/p thrombectomy followed by stenting of M2 by interventional radiology, sz disorder, depression  DM/Obesity:   Results for orders placed or performed in visit on 11/22/21  POCT glycosylated hemoglobin (Hb A1C)  Result Value Ref Range   Hemoglobin A1C     HbA1c POC (<> result, manual entry)     HbA1c, POC (prediabetic range)     HbA1c, POC (controlled diabetic range) 5.5 0.0 - 7.0 %    checking BS daily in a.m.  Range in 90-95.  No low BS episode.  On Amaryl 1 mg She stopped walking for exercise due to pain on plantar heel LT foot x 3 mths.    Worse when she first steps out of bed in the mornings.  Also pain in both knees.  Pain in knees for a while that is worse when she takes Crestor  HL/CVA:  does not take the Crestor every day.  Takes about 2x a wk because it causes a lot of pain in her jts.   Pain in shoulders, knees and elbows  Taking and tolerating Brilinta.  Bruises easily but no bleeding CVA left her with mild weakness in LT arm that has gotten better  Sz:  no sz since last visit.  Still taking and tolerating Keppra  Depression:  feels depression in remission on Lexapro.  Pain on RT breast for a while.  Worse over past 1 mth.  She has not felt any masses in the breast.  Has MMG schedule for 12/06/2021  Complains of itchy rash below her abdominal fold on the left side of the abdomen.  Reports having been given a cream for this in the past but it has not helped.  HM:  due for Shingrix.  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/13/2020   Major depression in remission ([place]) 06/16/2020   Class 2 severe  obesity with serious comorbidity and body mass index (BMI) of 35.0 to 35.9 in adult ([place]) 06/16/2020   Stenosis of intracranial vessel 06/16/2020   Seizures ([place]) 03/23/2019   TIA (transient ischemic attack) 11/18/2018   Depression 02/21/2018   Essential hypertension 02/21/2018   Cerebrovascular accident (CVA) due to embolism of right middle cerebral artery ([place]) 02/21/2018   Diabetes mellitus type 2 in obese ([place]) 12/20/2017   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     Blood Glucose Monitoring Suppl (TRUE METRIX METER) w/Device KIT 1 kit by Does not apply route 4 (four) times daily. 1 kit 0   Cholecalciferol (VITAMIN D3 PO) Take 1 tablet by mouth daily.     escitalopram (LEXAPRO) 10 MG tablet TAKE 1 TABLET (10 MG TOTAL) BY MOUTH DAILY. 30 tablet 5   glimepiride (AMARYL) 1 MG tablet TAKE 1 TABLET  BY MOUTH DAILY WITH BREAKFAST. 30 tablet 5   glucose blood (TRUE METRIX BLOOD GLUCOSE TEST) test strip Use as instructed 100 each 2   levETIRAcetam (KEPPRA) 500 MG tablet TAKE 1 TABLET (500 MG TOTAL) BY MOUTH 2 (TWO) TIMES DAILY. 180 tablet 3   meclizine (ANTIVERT) 12.5 MG tablet Take 1 tablet (12.5 mg total) by mouth 2 (two) times daily as needed for dizziness. 30 tablet 3   rosuvastatin (CRESTOR) 5 MG tablet Take 1 tablet (5 mg total) by mouth daily at 6 PM. (Patient not taking: Reported on 11/04/2021) 90 tablet 0   ticagrelor (BRILINTA) 90 MG TABS tablet TAKE 1 TABLET (90 MG TOTAL) BY MOUTH 2 (TWO) TIMES DAILY. 60 tablet 3   traMADol (ULTRAM) 50 MG tablet Take by mouth every 6 (six) hours as needed.     TRUEplus Lancets 28G MISC Use 4 times daily 120 each 3   No  current facility-administered medications on file prior to visit.    No Known Allergies  Social History   Socioeconomic History   Marital status: Single    Spouse name: Not on file   Number of children: Not on file   Years of education: Not on file   Highest education level: Not on file  Occupational History   Not on file  Tobacco Use   Smoking status: Former    Packs/day: 0.50    Years: 30.00    Pack years: 15.00    Types: Cigarettes    Quit date: 12/04/2017    Years since quitting: 3.9   Smokeless tobacco: Never  Vaping Use   Vaping Use: Never used  Substance and Sexual Activity   Alcohol use: Not Currently    Comment: weekly   Drug use: No   Sexual activity: Not Currently    Birth control/protection: None  Other Topics Concern   Not on file  Social History Narrative   Not on file   Social Determinants of Health   Financial Resource Strain: Not on file  Food Insecurity: Not on file  Transportation Needs: Not on file  Physical Activity: Not on file  Stress: Not on file  Social Connections: Not on file  Intimate Partner Violence: Not on file    Family History  Problem Relation Age of Onset   Diabetes Mother    Pulmonary disease Mother    Heart disease Father    Stroke Sister     Past Surgical History:  Procedure Laterality Date   BREAST EXCISIONAL BIOPSY     BREAST SURGERY     right breast   CHOLECYSTECTOMY     IR ANGIO INTRA EXTRACRAN SEL COM CAROTID INNOMINATE BILAT MOD SED  03/06/2018   IR ANGIO INTRA EXTRACRAN SEL COM CAROTID INNOMINATE BILAT MOD SED  11/20/2018   IR ANGIO INTRA EXTRACRAN SEL COM CAROTID INNOMINATE BILAT MOD SED  10/31/2019   IR ANGIO VERTEBRAL SEL VERTEBRAL BILAT MOD SED  03/06/2018   IR ANGIO VERTEBRAL SEL VERTEBRAL BILAT MOD SED  11/20/2018   IR ANGIO VERTEBRAL SEL VERTEBRAL BILAT MOD SED  10/31/2019   IR CT HEAD LTD  12/18/2017   IR INTRA CRAN STENT  12/18/2017   IR PERCUTANEOUS ART THROMBECTOMY/INFUSION INTRACRANIAL INC DIAG ANGIO   12/18/2017   IR RADIOLOGIST EVAL & MGMT  01/31/2018   IR US GUIDE VASC ACCESS RIGHT  10/31/2019   RADIOLOGY WITH ANESTHESIA N/A 12/18/2017   Procedure: RADIOLOGY WITH ANESTHESIA;  Surgeon: Luanne Bras, MD;  Location: [facility];  Service:  Radiology;  Laterality: N/A;    ROS: Review of Systems Negative except as stated above  PHYSICAL EXAM: BP 120/84    Pulse 76    Resp 16    Wt 244 lb 3.2 oz (110.8 kg)    SpO2 98%    BMI 38.25 kg/m   Physical Exam  General appearance - alert, well appearing, and in no distress Mental status - normal mood, behavior, speech, dress, motor activity, and thought processes Mouth - mucous membranes moist, pharynx normal without lesions Neck - supple, no significant adenopathy Chest - clear to auscultation, no wheezes, rales or rhonchi, symmetric air entry Heart - normal rate, regular rhythm, normal S1, S2, no murmurs, rubs, clicks or gallops Extremities - peripheral pulses normal, no pedal edema, no clubbing or cyanosis Skin -patient has 4 to 5 cm flat patchy scaly area under the abdominal fold on the left side.  It is not erythematous.  No odor. Neuro: Grip right 5/5.  Grip left 4+/5.  Power in the upper extremities proximally and distally 5/5 bilaterally. MSK: Knees large body habitus.  No point tenderness.  Good passive range of motion without crepitus.  Left heel: Mild tenderness on palpation of plantar heel.  No edema or erythema seen.  Depression screen [facility] 2/9 11/22/2021 11/04/2021 02/19/2021  Decreased Interest 0 0 0  Down, Depressed, Hopeless 0 0 0  PHQ - 2 Score 0 0 0  Altered sleeping 0 0 -  Tired, decreased energy 0 0 -  Change in appetite 0 0 -  Feeling bad or failure about yourself  0 0 -  Trouble concentrating 0 0 -  Moving slowly or fidgety/restless 0 0 -  Suicidal thoughts 0 0 -  PHQ-9 Score 0 0 -  Difficult doing work/chores - - -  Some recent data might be hidden    CMP Latest Ref Rng & Units 03/17/2021 06/16/2020 10/31/2019  Glucose 70  - 99 mg/dL 128(H) 93 100(H)  BUN 8 - 23 mg/dL _0 Creatinine 0.44 - 1.00 mg/dL 0.53 0.53(L) 0.30(L)  Sodium 135 - 145 mmol/L 138 140 143  Potassium 3.5 - 5.1 mmol/L 4.2 4.8 4.5  Chloride 98 - 111 mmol/L 106 103 108  CO2 22 - 32 mmol/L 25 24 -  Calcium 8.9 - 10.3 mg/dL 8.6(L) 9.3 -  Total Protein 6.0 - 8.5 g/dL - 6.9 -  Total Bilirubin 0.0 - 1.2 mg/dL - 0.4 -  Alkaline Phos 44 - 121 IU/L - 95 -  AST 0 - 40 IU/L - 20 -  ALT 0 - 32 IU/L - 17 -   Lipid Panel     Component Value Date/Time   CHOL 172 06/03/2021 1111   TRIG 95 06/03/2021 1111   HDL 56 06/03/2021 1111   CHOLHDL 3.1 06/03/2021 1111   CHOLHDL 2.5 03/24/2019 0611   VLDL 15 03/24/2019 0611   LDLCALC 99 06/03/2021 1111    CBC    Component Value Date/Time   WBC 7.3 03/17/2021 1351   RBC 4.78 03/17/2021 1351   HGB 14.5 03/17/2021 1351   HGB 14.1 06/16/2020 1140   HCT 45.3 03/17/2021 1351   HCT 43.6 06/16/2020 1140   PLT 285 03/17/2021 1351   PLT 273 06/16/2020 1140   MCV 94.8 03/17/2021 1351   MCV 91 06/16/2020 1140   MCH 30.3 03/17/2021 1351   MCHC 32.0 03/17/2021 1351   RDW 13.2 03/17/2021 1351   RDW 12.4 06/16/2020 1140   LYMPHSABS 2.1 03/17/2021 1351  MONOABS 0.5 03/17/2021 1351   EOSABS 0.1 03/17/2021 1351   BASOSABS 0.0 03/17/2021 1351    ASSESSMENT AND PLAN: 1. Diabetes mellitus type 2 in obese (HCC) At goal.  She is on low-dose of Amaryl without any hypoglycemic episodes.  Encourage healthy eating habits.  We will try to get things straightened out with her left heel so that she can resume exercising - POCT glycosylated hemoglobin (Hb A1C)  2. Class 2 severe obesity due to excess calories with serious comorbidity and body mass index (BMI) of 38.0 to 38.9 in adult [facility]) Patient advised to eliminate sugary drinks from the diet, cut back on portion sizes especially of white carbohydrates, eat more white lean meat like chicken [place] and seafood instead of beef or pork and incorporate fresh fruits  and vegetables into the diet daily.   3. Left hemiparesis ([place]) From previous CVA.  Continue Brilinta.  Since she is having a lot of joint pains with Crestor, we will try different statin in the form of pravastatin.  Start with her taking it 3 times a week. - pravastatin (PRAVACHOL) 10 MG tablet; Take 1 tablet by mouth on Monday, Wednesday, and Friday.  Dispense: 30 tablet; Refill: 6  4. Major depression in remission (HCC) Continue Lexapro  5. Seizure as late effect of cerebrovascular accident (CVA) ([place]) Continue Keppra.  Controlled on this medication  6. Breast pain, right Keep upcoming appointment for mammogram  7. Heel pain, chronic, left Likely heel spur versus plantar fasciitis. Recommend purchasing over-the-counter heel inserts. X-ray of the heel.  Further management will be based on results - DG Foot Complete Left; Future  8. Dermatitis - triamcinolone cream (KENALOG) 0.1 %; Apply cream to affected area(s) (two) times daily. Apply to rash on lower abdomen  Dispense: 30 g; Refill: 0  9. Chronic pain of both knees Discussed the importance of weight loss to take mechanical strain off the knees.  Check baseline x-rays. - DG Knee Complete 4 Views Left; Future - DG Knee Complete 4 Views Right; Future - diclofenac Sodium (VOLTAREN) 1 % GEL; Apply 2 g topically 2 (two) times daily as needed (apply to knees).  Dispense: 100 g; Refill: 1    AM[employer] Language interpreter used during this encounter. #[id number], lizbeth  Patient was given the opportunity to ask questions.  Patient verbalized understanding of the plan and was able to repeat key elements of the plan.   Orders Placed This Encounter  Procedures   POCT glycosylated hemoglobin (Hb A1C)     Requested Prescriptions    No prescriptions requested or ordered in this encounter    No follow-ups on file.  Karle Plumber, MD, FACP

## 2021-11-29 ENCOUNTER — Other Ambulatory Visit: Payer: Self-pay

## 2021-12-06 ENCOUNTER — Other Ambulatory Visit: Payer: Self-pay

## 2021-12-06 ENCOUNTER — Inpatient Hospital Stay: Admission: RE | Admit: 2021-12-06 | Payer: No Typology Code available for payment source | Source: Ambulatory Visit

## 2021-12-06 ENCOUNTER — Other Ambulatory Visit: Payer: No Typology Code available for payment source

## 2021-12-07 ENCOUNTER — Other Ambulatory Visit: Payer: Self-pay

## 2021-12-16 ENCOUNTER — Telehealth (INDEPENDENT_AMBULATORY_CARE_PROVIDER_SITE_OTHER): Payer: Self-pay | Admitting: Internal Medicine

## 2021-12-16 NOTE — Telephone Encounter (Signed)
Copied from CRM [id number]. Topic: General - Other ?>> Dec 14, 2021  2:18 PM [name], Heather [name] wrote: ?Reason for CRM: Pt requested [name] call back from [facility] regarding insurance / orange card. ?

## 2021-12-16 NOTE — Telephone Encounter (Signed)
I return Pt call, LM to call back ?

## 2021-12-20 ENCOUNTER — Ambulatory Visit
Admission: RE | Admit: 2021-12-20 | Discharge: 2021-12-20 | Disposition: A | Discharge status: Home / Self Care | Payer: No Typology Code available for payment source | Source: Ambulatory Visit | Attending: Internal Medicine | Admitting: Internal Medicine

## 2021-12-20 ENCOUNTER — Other Ambulatory Visit: Payer: Self-pay

## 2021-12-20 DIAGNOSIS — N644 Mastodynia: Secondary | ICD-10-CM

## 2021-12-24 ENCOUNTER — Other Ambulatory Visit: Payer: Self-pay

## 2021-12-27 ENCOUNTER — Other Ambulatory Visit: Payer: Self-pay | Admitting: Internal Medicine

## 2021-12-27 ENCOUNTER — Other Ambulatory Visit: Payer: Self-pay

## 2021-12-27 DIAGNOSIS — Z8673 Personal history of transient ischemic attack (TIA), and cerebral infarction without residual deficits: Secondary | ICD-10-CM

## 2021-12-29 ENCOUNTER — Other Ambulatory Visit: Payer: Self-pay

## 2021-12-29 MED ORDER — TICAGRELOR 90 MG PO TABS
ORAL_TABLET | ORAL | 3 refills | Status: DC
  Filled 2021-12-29: qty 60, 30d supply, fill #0

## 2022-01-06 ENCOUNTER — Ambulatory Visit: Payer: Self-pay | Attending: Physician Assistant | Admitting: Physician Assistant

## 2022-01-06 ENCOUNTER — Encounter: Payer: Self-pay | Admitting: Physician Assistant

## 2022-01-06 ENCOUNTER — Other Ambulatory Visit: Payer: Self-pay

## 2022-01-06 VITALS — BP 109/78 | HR 97 | Wt 242.4 lb

## 2022-01-06 DIAGNOSIS — R42 Dizziness and giddiness: Secondary | ICD-10-CM

## 2022-01-06 DIAGNOSIS — M25561 Pain in right knee: Secondary | ICD-10-CM

## 2022-01-06 DIAGNOSIS — G8929 Other chronic pain: Secondary | ICD-10-CM

## 2022-01-06 DIAGNOSIS — M25661 Stiffness of right knee, not elsewhere classified: Secondary | ICD-10-CM

## 2022-01-06 DIAGNOSIS — M25662 Stiffness of left knee, not elsewhere classified: Secondary | ICD-10-CM

## 2022-01-06 DIAGNOSIS — M722 Plantar fascial fibromatosis: Secondary | ICD-10-CM

## 2022-01-06 DIAGNOSIS — M25562 Pain in left knee: Secondary | ICD-10-CM

## 2022-01-06 DIAGNOSIS — Z8673 Personal history of transient ischemic attack (TIA), and cerebral infarction without residual deficits: Secondary | ICD-10-CM

## 2022-01-06 DIAGNOSIS — L309 Dermatitis, unspecified: Secondary | ICD-10-CM

## 2022-01-06 MED ORDER — MECLIZINE HCL 12.5 MG PO TABS
12.5000 mg | ORAL_TABLET | Freq: Two times a day (BID) | ORAL | 3 refills | Status: DC | PRN
  Filled 2022-01-06 – 2022-10-07 (×4): qty 30, 15d supply, fill #0

## 2022-01-06 MED ORDER — TRIAMCINOLONE ACETONIDE 0.1 % EX CREA
1.0000 "application " | TOPICAL_CREAM | Freq: Two times a day (BID) | CUTANEOUS | 0 refills | Status: DC
  Filled 2022-01-06: qty 30, 15d supply, fill #0

## 2022-01-06 MED ORDER — TICAGRELOR 90 MG PO TABS
ORAL_TABLET | ORAL | 3 refills | Status: DC
  Filled 2022-01-06: qty 60, fill #0
  Filled 2022-01-19: qty 60, 30d supply, fill #0
  Filled 2022-01-31: qty 60, fill #0
  Filled 2022-02-02: qty 60, 30d supply, fill #0
  Filled 2022-02-24: qty 30, 15d supply, fill #1
  Filled 2022-03-14: qty 60, 30d supply, fill #2
  Filled 2022-04-07 – 2022-08-17 (×10): qty 60, 30d supply, fill #3

## 2022-01-06 NOTE — [facility] Notes (Signed)
Patient ID: Heather [name], female   DOB: April 03, 1956, 66 y.o.   MRN: [id number] ? ? ?Heather [name], is a 66 y.o. female ? ?[id number] ? ?[id number] ? ?DOB - 15-Sep-1956 ? ?No chief complaint on file. ?    ? ?Subjective:  ? ?Heather [name] is a 66 y.o. female here today for wanting to get xrays chronic knee and foot pain.  She apparently did not realize these were ordered at her visit about 1 month ago by her PCP.   ? ?She also needs RF on steroid cream, brillinta and meclizine.  No new issues or concerns today ? ?No problems updated. ? ?ALLERGIES: ?No Known Allergies ? ?PAST MEDICAL HISTORY: ?Past Medical History:  ?Diagnosis Date  ? Depression   ? Diabetes ([place])   ? Hypertension   ? Seizure ([place])   ? Stroke [facility])   ? ? ?MEDICATIONS AT HOME: ?Prior to Admission medications   ?Medication Sig Start Date End Date Taking? Authorizing Provider  ?acetaminophen (TYLENOL) 500 MG tablet Take 1,000 mg by mouth every 8 (eight) hours as needed for moderate pain or headache.   Yes [provider]  ?Blood Glucose Monitoring Suppl (TRUE METRIX METER) w/Device KIT 1 kit by Does not apply route 4 (four) times daily. 06/16/21  Yes Ladell Pier, MD  ?Cholecalciferol (VITAMIN D3 PO) Take 1 tablet by mouth daily.   Yes [provider]  ?diclofenac Sodium (VOLTAREN) 1 % GEL Apply 2 g topically 2 (two) times daily as needed (apply to knees). 11/22/21  Yes Ladell Pier, MD  ?escitalopram (LEXAPRO) 10 MG tablet TAKE 1 TABLET (10 MG TOTAL) BY MOUTH DAILY. 07/15/21 07/15/22 Yes Ladell Pier, MD  ?glimepiride (AMARYL) 1 MG tablet TAKE 1 TABLET BY MOUTH DAILY WITH BREAKFAST. 09/03/21 09/03/22 Yes Ladell Pier, MD  ?glucose blood (TRUE METRIX BLOOD GLUCOSE TEST) test strip Use as instructed 07/13/21  Yes Ladell Pier, MD  ?levETIRAcetam (KEPPRA) 500 MG tablet TAKE 1 TABLET (500 MG TOTAL) BY MOUTH 2 (TWO) TIMES DAILY. 03/23/21 03/23/22 Yes Ladell Pier, MD  ?pravastatin  (PRAVACHOL) 10 MG tablet Take 1 tablet by mouth on Monday, Wednesday, and Friday. 11/22/21  Yes Ladell Pier, MD  ?traMADol (ULTRAM) 50 MG tablet Take by mouth every 6 (six) hours as needed.   Yes [provider]  ?TRUEplus Lancets 28G MISC Use 4 times daily 11/09/21  Yes Ladell Pier, MD  ?meclizine (ANTIVERT) 12.5 MG tablet Take 1 tablet (12.5 mg total) by mouth 2 (two) times daily as needed for dizziness. 01/06/22   Argentina Donovan, PA-C  ?ticagrelor (BRILINTA) 90 MG TABS tablet TAKE 1 TABLET (90 MG TOTAL) BY MOUTH 2 (TWO) TIMES DAILY. 01/06/22   Argentina Donovan, PA-C  ?triamcinolone cream (KENALOG) 0.1 % Apply cream to affected area(s) (two) times daily. Apply to rash on lower abdomen 01/06/22   Argentina Donovan, PA-C  ? ? ?ROS: ?Neg HEENT ?Neg resp ?Neg cardiac ?Neg GI ?Neg GU ?Neg MS ?Neg psych ?Neg neuro ? ?Objective:  ? ?Vitals:  ? 01/06/22 0851  ?BP: 109/78  ?Pulse: 97  ?SpO2: 94%  ?Weight: 242 lb 6.4 oz (110 kg)  ? ?Exam ?General appearance : Awake, alert, not in any distress. Speech Clear. Not toxic looking ?HEENT: Atraumatic and Normocephalic ?Neck: Supple, no JVD. No cervical lymphadenopathy.  ?Chest: Good air entry bilaterally, CTAB.  No rales/rhonchi/wheezing ?CVS: S1 S2 regular, no murmurs.  ?Extremities: B/L Lower Ext shows no edema,  both legs are warm to touch ?Neurology: Awake alert, and oriented X 3, CN II-XII intact, Non focal ?Skin: No Rash ? ?Data Review ?Lab Results  ?Component Value Date  ? HGBA1C 5.5 11/22/2021  ? HGBA1C 5.4 06/03/2021  ? HGBA1C 5.2 06/16/2020  ? ? ?Assessment & Plan  ? ?1. Dermatitis ?- triamcinolone cream (KENALOG) 0.1 %; Apply cream to affected area(s) (two) times daily. Apply to rash on lower abdomen  Dispense: 30 g; Refill: 0 ? ?2. History of cardioembolic cerebrovascular accident (CVA) ?- ticagrelor (BRILINTA) 90 MG TABS tablet; TAKE 1 TABLET (90 MG TOTAL) BY MOUTH 2 (TWO) TIMES DAILY.  Dispense: 60 tablet; Refill: 3 ? ?3. Plantar fasciitis ?Go to  [facility] and ask where the radiology department is.  Go to the radiology dept and let them know you are there for xrays Dr Wynetta Emery ordered last month ?- Ambulatory referral to Podiatry ? ?4. Knee joint stiffness, bilateral ?Go to [facility] and ask where the radiology department is.  Go to the radiology dept and let them know you are there for xrays Dr Wynetta Emery ordered last month ? ?5. Chronic pain of both knees ?Go to [facility] and ask where the radiology department is.  Go to the radiology dept and let them know you are there for xrays Dr Wynetta Emery ordered last month ? ?6. Dizziness ?Unchanged/stable ?- meclizine (ANTIVERT) 12.5 MG tablet; Take 1 tablet (12.5 mg total) by mouth 2 (two) times daily as needed for dizziness.  Dispense: 30 tablet; Refill: 3 ? ?AMN interpreters "Norma Fredrickson" used and additional time performing visit was required. ? ? ? ?Patient have been counseled extensively about nutrition and exercise. Other issues discussed during this visit include: low cholesterol diet, weight control and daily exercise, foot care, annual eye examinations at Ophthalmology, importance of adherence with medications and regular follow-up. We also discussed long term complications of uncontrolled diabetes and hypertension.  ? ?Return in about 4 months (around 05/08/2022) for Dr Wynetta Emery for chronic conditions. ? ?The patient was given clear instructions to go to ER or return to medical center if symptoms don't improve, worsen or new problems develop. The patient verbalized understanding. The patient was told to call to get lab results if they haven't heard anything in the next week.  ? ? ? ? ?Freeman Caldron, PA-C ?[place] ?[place], [place] ?[phone]   ?01/06/2022, 9:26 AM  ?

## 2022-01-06 NOTE — Patient Instructions (Signed)
Go to [facility] and ask where the radiology department is.  Go to the radiology dept and let them know you are there for xrays Dr Laural Benes ordered last month ?

## 2022-01-20 ENCOUNTER — Other Ambulatory Visit: Payer: Self-pay

## 2022-01-21 ENCOUNTER — Other Ambulatory Visit: Payer: Self-pay

## 2022-01-24 ENCOUNTER — Ambulatory Visit: Payer: No Typology Code available for payment source

## 2022-01-24 ENCOUNTER — Ambulatory Visit (INDEPENDENT_AMBULATORY_CARE_PROVIDER_SITE_OTHER): Payer: No Typology Code available for payment source

## 2022-01-24 ENCOUNTER — Ambulatory Visit (INDEPENDENT_AMBULATORY_CARE_PROVIDER_SITE_OTHER): Payer: No Typology Code available for payment source | Admitting: Podiatry

## 2022-01-24 ENCOUNTER — Other Ambulatory Visit: Payer: Self-pay

## 2022-01-24 DIAGNOSIS — M722 Plantar fascial fibromatosis: Secondary | ICD-10-CM

## 2022-01-24 MED ORDER — METHYLPREDNISOLONE 4 MG PO TBPK
ORAL_TABLET | ORAL | 0 refills | Status: DC
  Filled 2022-01-24: qty 21, 6d supply, fill #0

## 2022-01-24 MED ORDER — BETAMETHASONE SOD PHOS & ACET 6 (3-3) MG/ML IJ SUSP
3.0000 mg | Freq: Once | INTRAMUSCULAR | Status: AC
  Administered 2022-01-24: 3 mg via INTRA_ARTICULAR

## 2022-01-24 NOTE — [facility] Notes (Signed)
? ?  Subjective: ?66 y.o. female PMHx CVA on chronic anticoagulant medication, DM type II presenting for evaluation of left heel pain has been going on for about 3-5 weeks now.  Sudden onset.  Denies a history of injury or falls.  She experiences sharp pain in the heel especially getting out of bed.  She presents for further treatment and evaluation ? ? ?Past Medical History:  ?Diagnosis Date  ? Depression   ? Diabetes ([place])   ? Hypertension   ? Seizure ([place])   ? Stroke [facility])   ? ?Past Surgical History:  ?Procedure Laterality Date  ? BREAST EXCISIONAL BIOPSY    ? BREAST SURGERY    ? right breast  ? CHOLECYSTECTOMY    ? IR ANGIO INTRA EXTRACRAN SEL COM CAROTID INNOMINATE BILAT MOD SED  03/06/2018  ? IR ANGIO INTRA EXTRACRAN SEL COM CAROTID INNOMINATE BILAT MOD SED  11/20/2018  ? IR ANGIO INTRA EXTRACRAN SEL COM CAROTID INNOMINATE BILAT MOD SED  10/31/2019  ? IR ANGIO VERTEBRAL SEL VERTEBRAL BILAT MOD SED  03/06/2018  ? IR ANGIO VERTEBRAL SEL VERTEBRAL BILAT MOD SED  11/20/2018  ? IR ANGIO VERTEBRAL SEL VERTEBRAL BILAT MOD SED  10/31/2019  ? IR CT HEAD LTD  12/18/2017  ? [place] STENT  12/18/2017  ? IR PERCUTANEOUS ART THROMBECTOMY/INFUSION INTRACRANIAL INC DIAG ANGIO  12/18/2017  ? IR RADIOLOGIST EVAL & MGMT  01/31/2018  ? IR US GUIDE VASC ACCESS RIGHT  10/31/2019  ? RADIOLOGY WITH ANESTHESIA N/A 12/18/2017  ? Procedure: RADIOLOGY WITH ANESTHESIA;  Surgeon: Luanne Bras, MD;  Location: [place];  Service: Radiology;  Laterality: N/A;  ? ?No Known Allergies ? ? ?Objective: ?Physical Exam ?General: The patient is alert and oriented x3 in no acute distress. ? ?Dermatology: Skin is warm, dry and supple bilateral lower extremities. Negative for open lesions or macerations bilateral.  ? ?Vascular: Dorsalis Pedis and Posterior Tibial pulses palpable bilateral.  Capillary fill time is immediate to all digits. ? ?Neurological: Epicritic and protective threshold intact bilateral.  ? ?Musculoskeletal: Tenderness to palpation to the  plantar aspect of the left heel along the plantar fascia. All other joints range of motion within normal limits bilateral. Strength 5/5 in all groups bilateral.  ? ?Radiographic exam: Normal osseous mineralization. Joint spaces preserved. No fracture/dislocation/boney destruction. No other soft tissue abnormalities or radiopaque foreign bodies.  Plantar heel spur noted on lateral view ? ?Assessment: ?1. Plantar fasciitis left foot ? ?Plan of Care:  ?1. Patient evaluated. Xrays reviewed.   ?2. Injection of 0.5cc Celestone soluspan injected into the left plantar fascia.  ?3. Rx for Medrol Dose Pak placed ?4.  No NSAIDs prescribed.  Patient is on chronic anticoagulant medication ?5. Plantar fascial band(s) dispensed.  Wear daily ?6. Instructed patient regarding therapies and modalities at home to alleviate symptoms.  ?7. Return to clinic in 4 weeks.   ? ? ?Edrick Kins, DPM ?[place] ? ?Dr. Edrick Kins, DPM  ?  ?[address]                                     ?[place], [place] [address]                ?Office [phone]  ?Fax [phone] ? ? ? ? ?

## 2022-01-28 ENCOUNTER — Other Ambulatory Visit: Payer: Self-pay

## 2022-01-31 ENCOUNTER — Other Ambulatory Visit: Payer: Self-pay

## 2022-02-02 ENCOUNTER — Other Ambulatory Visit: Payer: Self-pay

## 2022-02-21 ENCOUNTER — Ambulatory Visit: Payer: No Typology Code available for payment source | Admitting: Podiatry

## 2022-02-24 ENCOUNTER — Other Ambulatory Visit: Payer: Self-pay

## 2022-02-25 ENCOUNTER — Other Ambulatory Visit: Payer: Self-pay

## 2022-03-01 ENCOUNTER — Other Ambulatory Visit: Payer: Self-pay | Admitting: Internal Medicine

## 2022-03-01 MED ORDER — GLIMEPIRIDE 1 MG PO TABS
ORAL_TABLET | Freq: Every day | ORAL | 0 refills | Status: DC
Start: 2022-03-01 — End: 2022-04-18
  Filled 2022-03-01: qty 30, 30d supply, fill #0

## 2022-03-02 ENCOUNTER — Other Ambulatory Visit: Payer: Self-pay

## 2022-03-03 ENCOUNTER — Other Ambulatory Visit: Payer: Self-pay

## 2022-03-14 ENCOUNTER — Other Ambulatory Visit: Payer: Self-pay

## 2022-03-14 ENCOUNTER — Other Ambulatory Visit: Payer: Self-pay | Admitting: Internal Medicine

## 2022-03-15 ENCOUNTER — Other Ambulatory Visit: Payer: Self-pay

## 2022-03-15 MED ORDER — LEVETIRACETAM 500 MG PO TABS
ORAL_TABLET | Freq: Two times a day (BID) | ORAL | 2 refills | Status: DC
  Filled 2022-03-15: qty 60, 30d supply, fill #0
  Filled 2022-04-07: qty 60, 30d supply, fill #1
  Filled 2022-04-18 – 2022-05-18 (×2): qty 60, 30d supply, fill #2

## 2022-03-15 MED ORDER — ESCITALOPRAM OXALATE 10 MG PO TABS
ORAL_TABLET | Freq: Every day | ORAL | 2 refills | Status: DC
  Filled 2022-03-15: qty 30, 30d supply, fill #0
  Filled 2022-04-07: qty 30, 30d supply, fill #1
  Filled 2022-04-18 – 2022-05-18 (×2): qty 30, 30d supply, fill #2

## 2022-03-16 ENCOUNTER — Other Ambulatory Visit: Payer: Self-pay

## 2022-03-21 ENCOUNTER — Other Ambulatory Visit: Payer: Self-pay

## 2022-03-28 ENCOUNTER — Ambulatory Visit: Payer: Self-pay | Admitting: Adult Health

## 2022-03-28 ENCOUNTER — Encounter: Payer: Self-pay | Admitting: Adult Health

## 2022-03-28 NOTE — Progress Notes (Deleted)
[facility] [address] [place]. [place] [address] [phone]       OFFICE FOLLOW-UP NOTE  [name] Date of Birth:  [birth year] Medical Record Number:  [id number]    Reason for visit: Stroke and seizure follow-up   Chief complaint: No chief complaint on file.     HPI:   Update 03/28/2022 JM: Patient returns for stroke and seizure follow-up after prior visit 10 months ago.  She is accompanied by [facility] interpreter.  Overall stable without new or reoccurring stroke/TIA symptoms.  Reports compliance on Brilinta, denies side effects.  PCP switched Crestor to pravastatin due to myalgias ***.  Blood pressure today ***.  Reports compliance on Keppra 500 mg twice daily, denies side effects, denies any seizure activity.  Did have follow-up with Dr. Estanislado [name] with repeat MRA 07/2021 which showed patent R M1 stent.        History provided for reference purposes only Update 05/13/2021 JM: Returns for yearly routine follow-up visit accompanied by [facility] interpreter.  Reports over the past 6 months, occasional left mouth tension/pulling sensation usually in the evenings.  Denies any other associated symptoms such as visual changes, headache, pain, speech difficulties or focal weakness.  Continued chronic intermittent tension type frontal headaches more recently on the right side likely due to dental carries but unable to have tooth removal (per pt) due to use of Brilinta. Repeat MRA 07/2020 patent R M1 stent -recommended 62-monthfollow-up imaging with Dr. DEstanislado [name] Not yet completed - she is requesting a referral to IR for repeat imaging.  Recently ran out of Crestor previously taking without side effects.  Blood pressure today 131/89.  Remains on Keppra 500 mg twice daily tolerating without recurrent seizure activity.  No further concerns at this time.  Update 05/12/2020 JM: Ms. Heather Aspenreturns for stroke follow-up.  She is accompanied by  interpreter  Stable since prior visit without new stroke/TIA symptoms Continues on aspirin and Brilinta (post stent per IR) with easy bruising but no bleeding Continue on Crestor without myalgias Blood pressure today 115/77  No reoccurring seizure type activity or events Continues on Keppra 500 mg twice daily without side effects  She does report chronic intermittent vertigo and requesting refill of meclizine   Update 11/13/2019:  Stable.  Remains on Keppra without seizure activity ?  Atorvastatin side effect therefore Crestor initiated Blood pressure stable Cerebral angiogram 10/31/2019 50 to 70% patency of the stented segment of right MCA and recommended MRA in 6 months time.   Continues on aspirin and Brilinta as instructed by IR without bleeding or bruising.     05/29/2019 update:  New onset seizure 03/23/2019 -ED eval with reported tonic-clonic seizure Likely late effect of stroke Initiated Keppra without reoccurring seizure activity  Update 11/26/2018 :  Single episode of right-sided facial twitching lasting approximately 10 minutes of unknown etiology CTA 50% IntraStent stenosis  Cerebral angiogram 30 to 40% IntraStent restenosis EEG negative MRI no acute abnormality  Update 08/16/2018:  Stable C/o right-sided intermittent neck pain evaluated by PCP Compliant with aspirin and Brilinta  05/16/2018 visit:  Mild diminished fine motor skills left hand post stroke Patient reported 85% recovery Cerebral angiogram 03/06/2018 50% right MCA IntraStent stenosis  Stroke admission 12/2017: MShaunice Levitanis an 66y.o. female Hispanic with PMH of HTN,Obesity who presents Heather [name] room as a stroke alert with left hemiparesis. Patient is Spanish-speaking and initially EMS was told that her symptoms began at 910PM after  she dropped a glass in her left hand. CT head showed no hemorrhage and tPA was mixed. However after nurse spoke to her in [place], patient stated that she had a  headache that began around 6:30pm and had left side weakness as she was outside window period upon completion of CT scan.   CTA head and neck was performed which showed a superior division M2 cutoff. After discussing with family given her borderline NIHSS of 7, however worsening symptoms ( from EMS assessment to arrival at hospital)  we decided to take the patient for mechanical thrombectomy.  Date last known well:3.17.19Time last known well: around  6.30 pm tPA Given: no, outside window at time of CT scan NIHSS: 7 ( at time of arrival)  Baseline MRS 0  . After written informed consent from the patient and family She was taken to the interventional radiology suite for thrombectomy and right middle cerebral artery  Rescue stent placement performed by Dr. Estanislado [name] for progressive occlusion of the inferior division of the right middle cerebral artery due to underlying stenosis. She also received 10 mg of superselective into arterial Integrilin. She was given loading dose of 180 mg of Brilinta and aspirin. The patient was initially and in the neurological intensive care unit and closely monitored she did well. MRI scan of the brain showed a moderate-sized right MCA infarct.Marland Kitchen LDL cholesterol 67 mg percent and women A1c was 6.7. Transthoracic echo showed normal ejection fraction. Patient had initial right gaze preference and left hemiplegia but made gradual improvement. She was seen by physical occupational and speech therapy. She was transferred to inpatient rehabilitation and has done well.      ROS:   14 system review of systems is positive for those listed in HPI and all other systems negative  PMH:  Past Medical History:  Diagnosis Date   Depression    Diabetes ([place])    Hypertension    Seizure ([place])    Stroke ([place])     Social History:  Social History   Socioeconomic History   Marital status: Single    Spouse name: Not on file   Number of children: Not on file   Years of education: Not on  file   Highest education level: Not on file  Occupational History   Not on file  Tobacco Use   Smoking status: Former    Packs/day: 0.50    Years: 30.00    Total pack years: 15.00    Types: Cigarettes    Quit date: 12/04/2017    Years since quitting: 4.3   Smokeless tobacco: Never  Vaping Use   Vaping Use: Never used  Substance and Sexual Activity   Alcohol use: Not Currently    Comment: weekly   Drug use: No   Sexual activity: Not Currently    Birth control/protection: None  Other Topics Concern   Not on file  Social History Narrative   Not on file   Social Determinants of Health   Financial Resource Strain: Not on file  Food Insecurity: Not on file  Transportation Needs: Not on file  Physical Activity: Not on file  Stress: Not on file  Social Connections: Not on file  Intimate Partner Violence: Not on file    Medications:   Current Outpatient Medications on File Prior to Visit  Medication Sig Dispense Refill   acetaminophen (TYLENOL) 500 MG tablet Take 1,000 mg by mouth every 8 (eight) hours as needed for moderate pain or headache.  Blood Glucose Monitoring Suppl (TRUE METRIX METER) w/Device KIT 1 kit by Does not apply route 4 (four) times daily. 1 kit 0   Cholecalciferol (VITAMIN D3 PO) Take 1 tablet by mouth daily.     diclofenac Sodium (VOLTAREN) 1 % GEL Apply 2 g topically 2 (two) times daily as needed (apply to knees). 100 g 1   escitalopram (LEXAPRO) 10 MG tablet TAKE 1 TABLET (10 MG TOTAL) BY MOUTH DAILY. 30 tablet 2   glimepiride (AMARYL) 1 MG tablet TAKE 1 TABLET BY MOUTH DAILY WITH BREAKFAST. 30 tablet 0   glucose blood (TRUE METRIX BLOOD GLUCOSE TEST) test strip Use as instructed 100 each 2   levETIRAcetam (KEPPRA) 500 MG tablet TAKE 1 TABLET (500 MG TOTAL) BY MOUTH 2 (TWO) TIMES DAILY. 60 tablet 2   meclizine (ANTIVERT) 12.5 MG tablet Take 1 tablet (12.5 mg total) by mouth 2 (two) times daily as needed for dizziness. 30 tablet 3   methylPREDNISolone  (MEDROL DOSEPAK) 4 MG TBPK tablet Take as directed 21 tablet 0   pravastatin (PRAVACHOL) 10 MG tablet Take 1 tablet by mouth on Monday, Wednesday, and Friday. 30 tablet 6   ticagrelor (BRILINTA) 90 MG TABS tablet TAKE 1 TABLET (90 MG TOTAL) BY MOUTH 2 (TWO) TIMES DAILY. 60 tablet 3   traMADol (ULTRAM) 50 MG tablet Take by mouth every 6 (six) hours as needed.     triamcinolone cream (KENALOG) 0.1 % Apply cream to affected area(s) (two) times daily. Apply to rash on lower abdomen 30 g 0   TRUEplus Lancets 28G MISC Use 4 times daily 120 each 3   No current facility-administered medications on file prior to visit.    Allergies:  No Known Allergies  Physical Exam  There were no vitals filed for this visit.   There is no height or weight on file to calculate BMI.   General: Well-nourished, well-developed pleasant middle-age  Hispanic Spanish-speaking lady, seated, in no evident distress Neck: supple with no carotid or supraclavicular bruits Cardiovascular: regular rate and rhythm, no murmurs Vascular:  Normal pulses all extremities  Neurologic Exam Mental Status: Awake and fully alert.  Primary Spanish speaking.  Denies speech or language difficulty.  Oriented to place and time. Recent and remote memory intact. Attention span, concentration and fund of knowledge appropriate. Mood and affect appropriate.  Cranial Nerves: Pupils equal, briskly reactive to light. Extraocular movements full without nystagmus. Visual fields full to confrontation. Hearing intact. Facial sensation intact.  Mild left facial asymmetry when smiling.  Tongue and palate moves normally and symmetrically.  Motor: Normal bulk and tone. Normal strength in all tested extremity muscles.  Sensory.: intact to touch ,pinprick .position and vibratory sensation.  Coordination: Rapid alternating movements normal in all extremities. Finger-to-nose and heel-to-shin performed accurately bilaterally. Gait and Station: Arises from chair  without difficulty. Stance is normal. Gait demonstrates normal stride length and balance without use of assistive device. Able to heel, toe and tandem walk without difficulty.  Reflexes: 1+ and symmetric. Toes downgoing.      ASSESSMENT: 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 Vascular risk factors of intracranial stenosis, diabetes, hypertension and hyperlipidemia.  Prior hospitalization 11/18/2018 with transient facial twitchings of unclear etiology. Presented to ED on 03/23/2019 with tonic-clonic seizure activity with Keppra initiated.       1.  Right MCA infract s/p MCA  stent -Intermittent left facial "tightness/pulling" over the past 6 months with mild left facial asymmetry on exam - likely from prior stroke but needs to schedule f/u with Dr. Estanislado [name] for repeat imaging as prior imaging 07/2020 and recommended 76-monthfollow up imaging not yet completed - will reach out to IR to further assist. Low suspicion symptoms in setting of new or worsening stroke as no other focal deficits appreciated although will be ruled out with f/u imaging with Dr. DEstanislado [name] Of note, eval 11/2018 for transient episode of right facial drawing and twitching with full work-up unremarkable. Discussed importance of proceeding to ED immediately with any new or reoccurring stroke/TIA symptoms for further evaluation -Continue to follow with Dr. DEstanislado PandyIR s/p stent -MRA 07/2021 patent R M1 stent - Continue Brilinta and restart Crestor 5 mg daily for secondary stroke prevention -Close PCP follow-up for aggressive stroke risk factor management including HTN with BP goal<130/90, HLD with LDL goal<70 and DM with A1c goal<7  -A1c 5.5 (11/2021) -LDL 99 (06/2021)  2.  Seizure, post stroke -No recurrent seizure type activity or symptoms -Continue taking Keppra 500 mg  BID for seizure prophylaxis - refill provided by PCP  3.  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 DEstanislado Pandyto undergo dental procedure    Follow-up in 1 year or call earlier if needed   CC:  JLadell Pier MD     I spent 36 minutes of face-to-face and non-face-to-face time with patient assisted by interpreter.  This included previsit chart review, lab review, study review, order entry, electronic health record documentation, patient education regarding history of right MCA stroke, post stroke seizures, intracranial stenosis, secondary stroke prevention measures and importance of managing stroke risk factors, chronic tension headaches and visual symptoms and answered all other questions to patient satisfaction   JFrann Rider AGNP-BC  [facility] [address]SFolsomGRuidoso  [id number] Phone [phone] [phone]: This document was prepared with digital dictation and possible smart phrase technology. Any transcriptional errors that result from this process are unintentional.

## 2022-03-31 ENCOUNTER — Ambulatory Visit: Payer: Self-pay

## 2022-03-31 NOTE — Telephone Encounter (Signed)
   Chief Complaint: Dizziness, headache Symptoms: Above Frequency: Today Pertinent Negatives: Patient denies any other symptoms Disposition: [] ED /[] Urgent Care (no appt availability in office) / [] Appointment(In office/virtual)/ []  [facility]/ [] Home Care/ [] Refused Recommended Disposition /[] [facility]/ [x]  Follow-up with PCP Additional Notes: Will only see Dr. . Asking to be worked in. Please advise pt.  Answer Assessment - Initial Assessment Questions 1. DESCRIPTION: "Describe your dizziness."     Dizzy 2. LIGHTHEADED: "Do you feel lightheaded?" (e.g., somewhat faint, woozy, weak upon standing)     Woozy 3. VERTIGO: "Do you feel like either you or the room is spinning or tilting?" (i.e. vertigo)     No 4. SEVERITY: "How bad is it?"  "Do you feel like you are going to faint?" "Can you stand and walk?"   - MILD: Feels slightly dizzy, but walking normally.   - MODERATE: Feels unsteady when walking, but not falling; interferes with normal activities (e.g., school, work).   - SEVERE: Unable to walk without falling, or requires assistance to walk without falling; feels like passing out now.      Mild 5. ONSET:  "When did the dizziness begin?"     Today 6. AGGRAVATING FACTORS: "Does anything make it worse?" (e.g., standing, change in head position)     Walking 7. HEART RATE: "Can you tell me your heart rate?" "How many beats in 15 seconds?"  (Note: not all patients can do this)       No 8. CAUSE: "What do you think is causing the dizziness?"     Unsure 9. RECURRENT SYMPTOM: "Have you had dizziness before?" If Yes, ask: "When was the last time?" "What happened that time?"     Yes 10. OTHER SYMPTOMS: "Do you have any other symptoms?" (e.g., fever, chest pain, vomiting, diarrhea, bleeding)       Headache 11. PREGNANCY: "Is there any chance you are pregnant?" "When was your last menstrual period?"       No  Protocols used: Dizziness -  Lightheadedness-A-AH

## 2022-04-07 ENCOUNTER — Other Ambulatory Visit: Payer: Self-pay

## 2022-04-07 ENCOUNTER — Other Ambulatory Visit: Payer: Self-pay | Admitting: Internal Medicine

## 2022-04-08 ENCOUNTER — Other Ambulatory Visit: Payer: Self-pay

## 2022-04-08 IMAGING — MG DIGITAL DIAGNOSTIC BILAT W/ TOMO W/ CAD
8 series · 8 of 24 positions shown · non-contrast
Comparison: Previous exam(s).

CLINICAL DATA: Patient complains of diffuse right breast pain.

EXAM:
DIGITAL DIAGNOSTIC BILATERAL MAMMOGRAM WITH TOMOSYNTHESIS AND CAD
TECHNIQUE: Bilateral digital diagnostic mammography and breast tomosynthesis
was performed. The images were evaluated with computer-aided
detection.

[L MLO synth-2D]
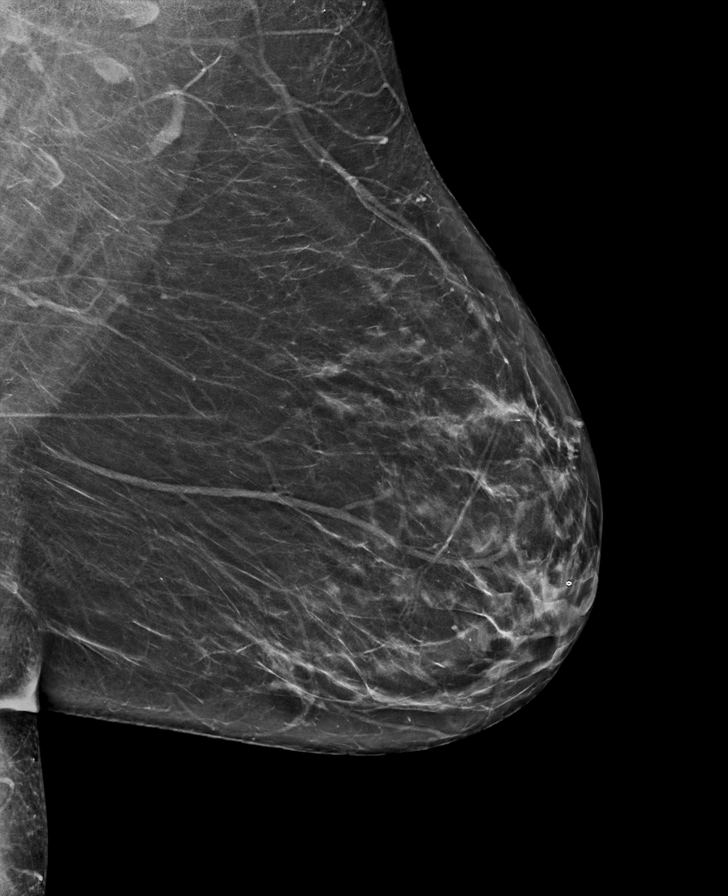

[R CC synth-2D]
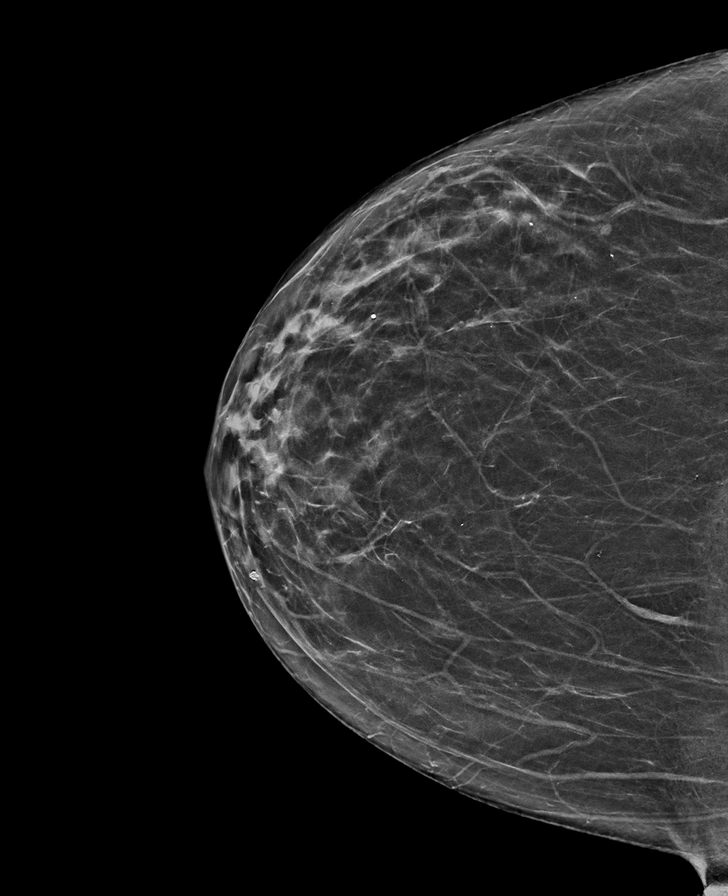

[L CC synth-2D]
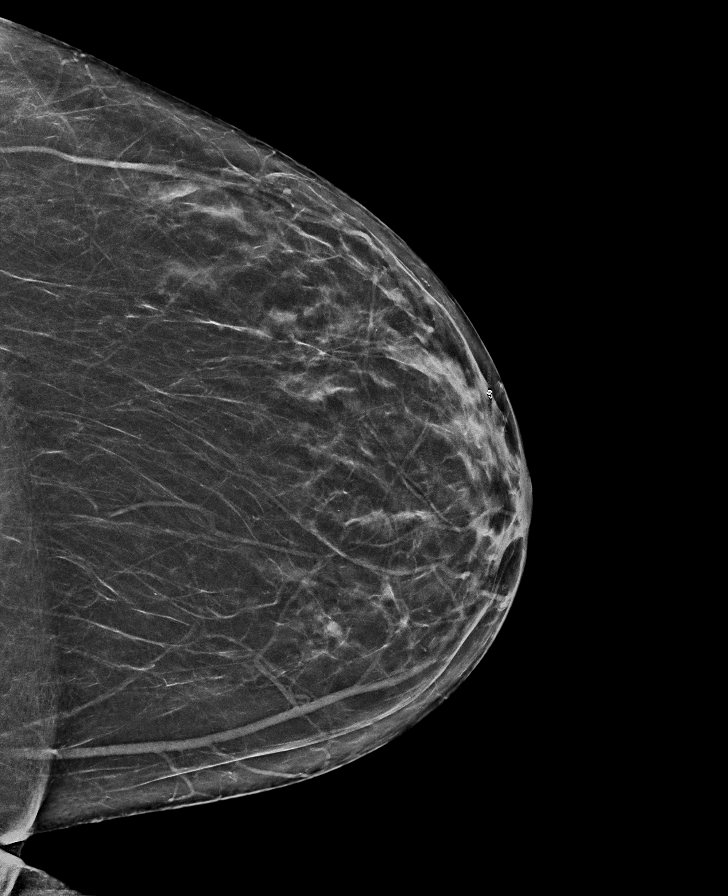

[R MLO synth-2D]
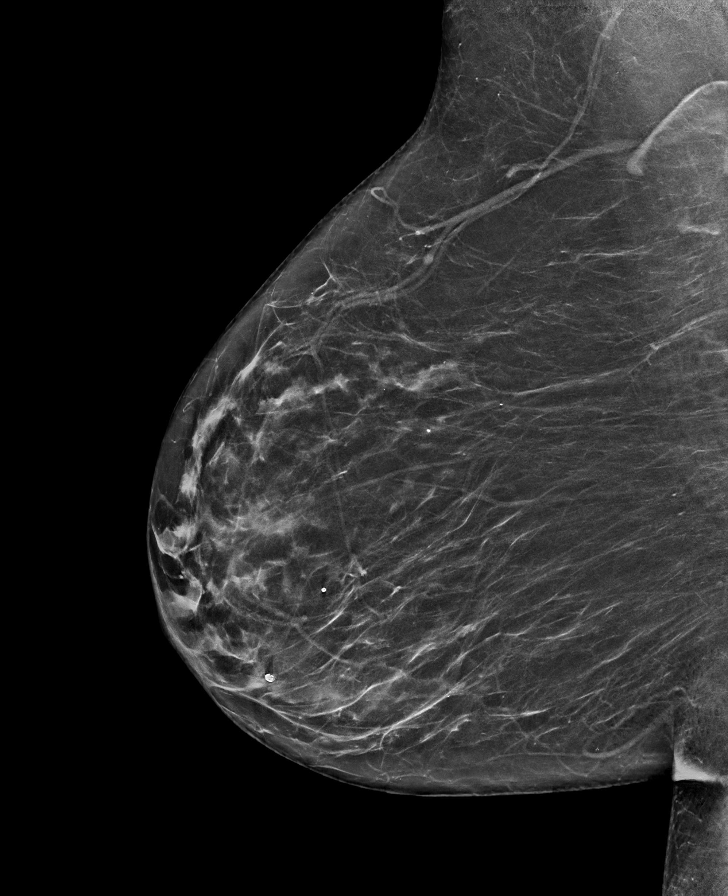

[R MLO tomo · tomo slice 37/72.0]
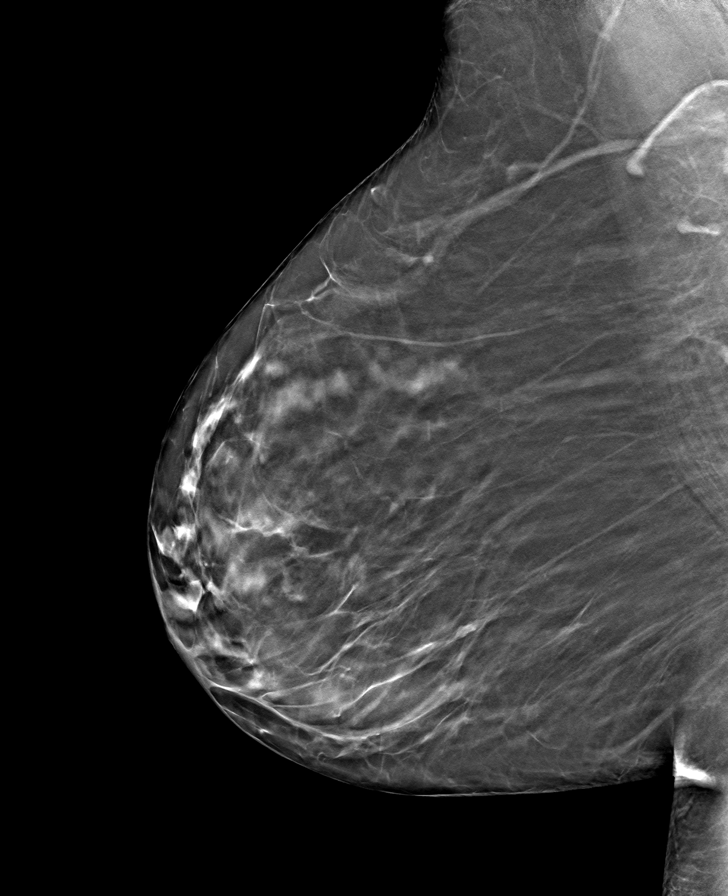

[L MLO tomo · tomo slice 37/73.0]
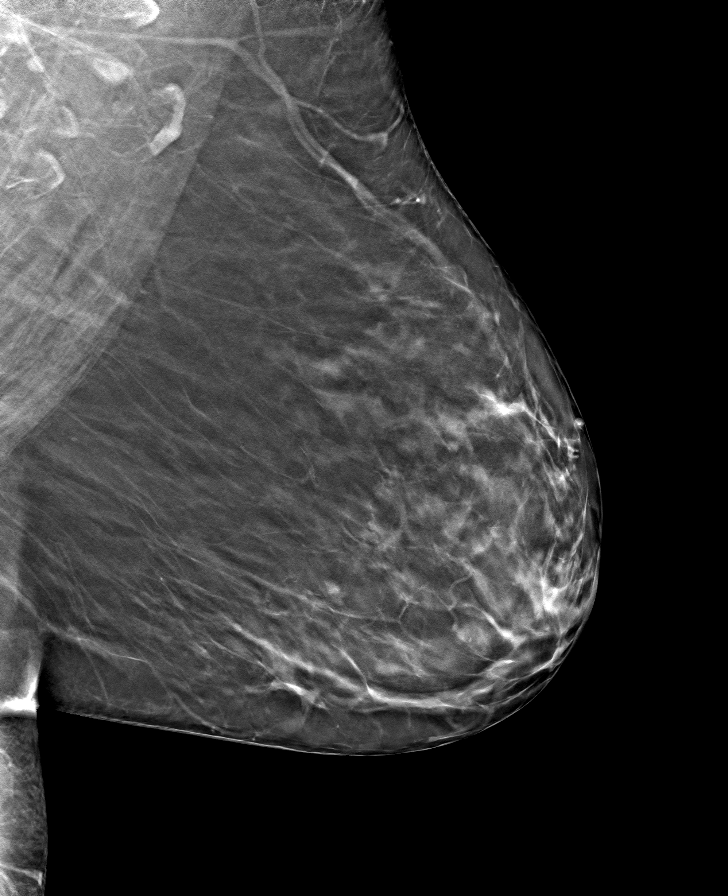

[L CC tomo · tomo slice 33/65.0]
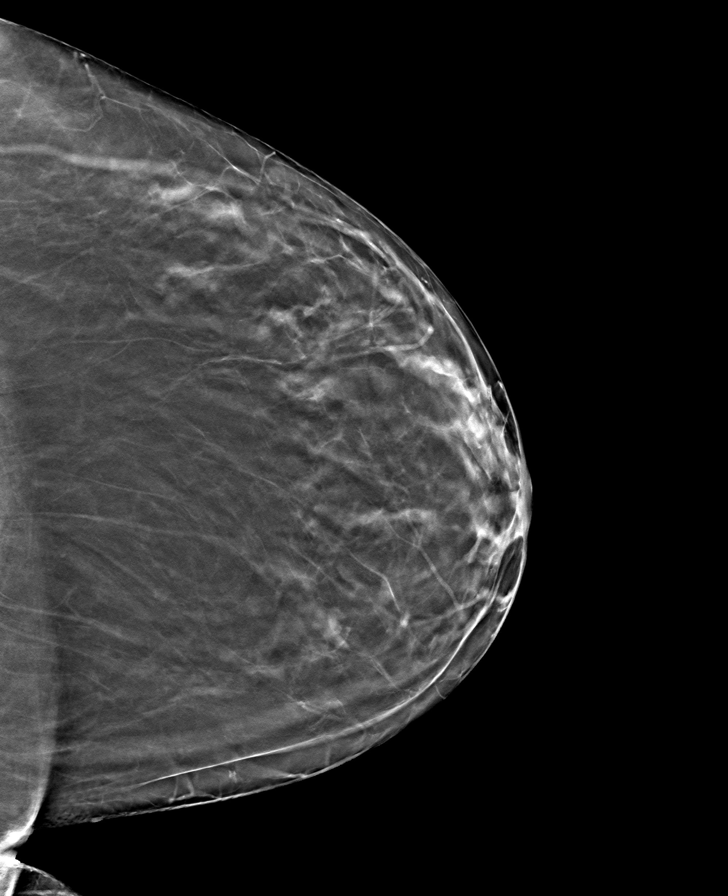

[R CC tomo · tomo slice 33/65.0]
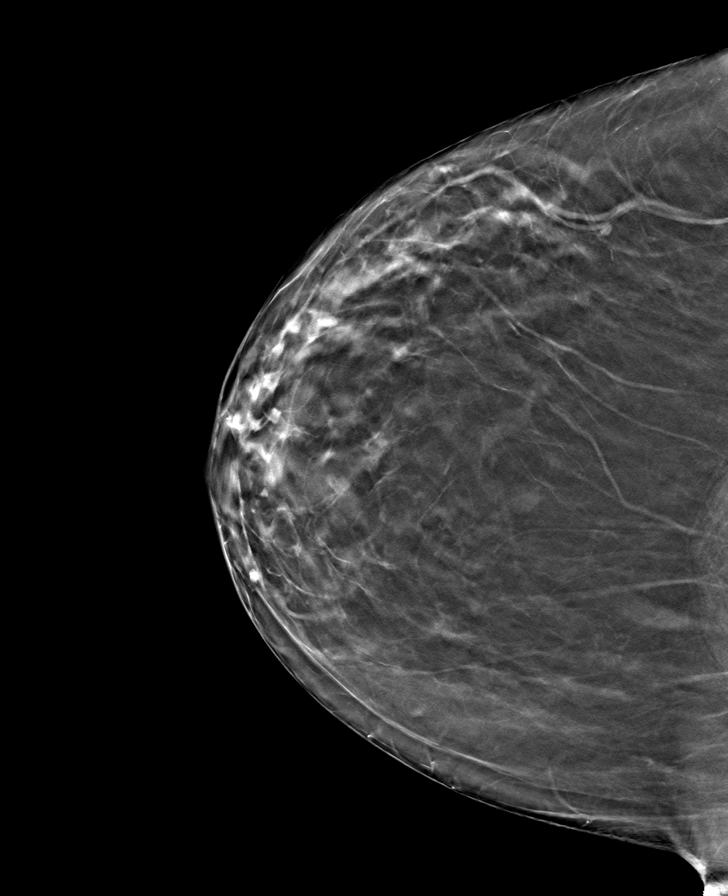

[8 of 24 positions shown; findings below may reference images not displayed]

ACR Breast Density Category b: There are scattered areas of
fibroglandular density.
FINDINGS: No suspicious mass, malignant type microcalcifications or distortion
detected in either breast.
IMPRESSION: No evidence of malignancy in either breast.

RECOMMENDATION:
Bilateral screening mammogram in 1 year is recommended.

I have discussed the findings and recommendations with the patient.
If applicable, a reminder letter will be sent to the patient
regarding the next appointment.

BI-RADS CATEGORY  1: Negative.

## 2022-04-11 ENCOUNTER — Other Ambulatory Visit: Payer: Self-pay

## 2022-04-11 NOTE — Telephone Encounter (Addendum)
Late entry. Patient was called on 04/08/2022 by assist of a interpreter, [place], [place] # [phone]  Scheduled an appt with PCP.

## 2022-04-18 ENCOUNTER — Other Ambulatory Visit: Payer: Self-pay

## 2022-04-18 ENCOUNTER — Other Ambulatory Visit: Payer: Self-pay | Admitting: Internal Medicine

## 2022-04-18 MED ORDER — GLIMEPIRIDE 1 MG PO TABS
ORAL_TABLET | Freq: Every day | ORAL | 0 refills | Status: DC
  Filled 2022-04-18: qty 30, 30d supply, fill #0

## 2022-04-19 ENCOUNTER — Other Ambulatory Visit: Payer: Self-pay

## 2022-05-03 ENCOUNTER — Ambulatory Visit: Payer: Self-pay | Attending: Internal Medicine | Admitting: Internal Medicine

## 2022-05-03 ENCOUNTER — Encounter: Payer: Self-pay | Admitting: Internal Medicine

## 2022-05-03 ENCOUNTER — Other Ambulatory Visit: Payer: Self-pay

## 2022-05-03 VITALS — BP 110/79 | HR 85 | Temp 98.6°F | Resp 20 | Ht 66.0 in | Wt 247.0 lb

## 2022-05-03 DIAGNOSIS — F320 Major depressive disorder, single episode, mild: Secondary | ICD-10-CM

## 2022-05-03 DIAGNOSIS — E782 Mixed hyperlipidemia: Secondary | ICD-10-CM

## 2022-05-03 DIAGNOSIS — E669 Obesity, unspecified: Secondary | ICD-10-CM

## 2022-05-03 DIAGNOSIS — E1169 Type 2 diabetes mellitus with other specified complication: Secondary | ICD-10-CM

## 2022-05-03 DIAGNOSIS — R300 Dysuria: Secondary | ICD-10-CM

## 2022-05-03 DIAGNOSIS — Z8673 Personal history of transient ischemic attack (TIA), and cerebral infarction without residual deficits: Secondary | ICD-10-CM

## 2022-05-03 LAB — POCT URINALYSIS DIP (CLINITEK)
Bilirubin, UA: NEGATIVE
Blood, UA: NEGATIVE
Glucose, UA: NEGATIVE mg/dL
Ketones, POC UA: NEGATIVE mg/dL
Nitrite, UA: NEGATIVE
POC PROTEIN,UA: NEGATIVE
Spec Grav, UA: 1.02 (ref 1.010–1.025)
Urobilinogen, UA: 0.2 E.U./dL
pH, UA: 5.5 (ref 5.0–8.0)

## 2022-05-03 LAB — POCT GLYCOSYLATED HEMOGLOBIN (HGB A1C): Hemoglobin A1C: 5.6 % (ref 4.0–5.6)

## 2022-05-03 LAB — GLUCOSE, POCT (MANUAL RESULT ENTRY): POC Glucose: 116 mg/dl — AB (ref 70–99)

## 2022-05-03 MED ORDER — TRAZODONE HCL 50 MG PO TABS
ORAL_TABLET | ORAL | 3 refills | Status: DC
  Filled 2022-05-03: qty 15, 30d supply, fill #0
  Filled 2022-06-11: qty 15, 30d supply, fill #1
  Filled 2022-06-30 – 2022-07-12 (×2): qty 15, 30d supply, fill #2
  Filled 2022-08-09: qty 15, 30d supply, fill #3

## 2022-05-03 NOTE — Progress Notes (Signed)
"  Issues to pee" Pain LLQ x since last Wednesday  Denies dysuria today.   Spots on her face

## 2022-05-03 NOTE — Progress Notes (Signed)
Patient ID: Heather [name], female    DOB: 1955-11-11  MRN: [id number]  CC: Chronic disease management  Subjective: Heather [name] is a 66 y.o. female who presents for chronic disease management Her concerns today include:  Hx of DM, HTN, HL,former smoker tob dep, CVA RT MCA territory, s/p thrombectomy followed by stenting of M2 by interventional radiology, sz disorder, depression   C/o discomfort with urination with small amount of urinae production 6 days ago.  Lasted 2 days then resolved.  Currently no dysuria or fever.  Sz Disorder:   no sz since I last saw her.  Taking Keppra consistently. Sometimes she feels twitching on the corner of the mouth on LT side.  Does not happen often Would like to schedule f/u appt with [facility].  Missed appt in June because she did not have transportation and does not drive  HL/CVA:  taking  Brilinta but has problems tolerating statin due to pain/body aches.  Has tried her with several.  Currently taking Pravachol 3 times a wk and still has body aches  DM: A1C today is 5.6.  Still on 1 mg Amaryl.  Checks BS 4-5 times a wk.  Range in the 120s. Eats a lot of sweets and ice cream.  She tries to walk several days a week when it is not too hot outside.  No able to sleep and feels the antidepressant not working well.  Feels very depressed.  Denies any suicidal ideation.  "Sometimes I feel like screaming."    HM:  since declines shingles vaccine. 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[place]) 11/13/2020   Major depression in remission ([place]) 06/16/2020   Class 2 severe obesity with serious comorbidity and body mass index (BMI) of 35.0 to 35.9 in adult ([place]) 06/16/2020   Stenosis of intracranial vessel 06/16/2020   Seizures ([place]) 03/23/2019   TIA (transient ischemic attack) 11/18/2018   Depression 02/21/2018   Essential hypertension 02/21/2018   Cerebrovascular accident (CVA)  due to embolism of right middle cerebral artery ([place]) 02/21/2018   Diabetes mellitus type 2 in obese ([place]) 12/20/2017   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     Blood Glucose Monitoring Suppl (TRUE METRIX METER) w/Device KIT 1 kit by Does not apply route 4 (four) times daily. 1 kit 0   Cholecalciferol (VITAMIN D3 PO) Take 1 tablet by mouth daily.     diclofenac Sodium (VOLTAREN) 1 % GEL Apply 2 g topically 2 (two) times daily as needed (apply to knees). 100 g 1   escitalopram (LEXAPRO) 10 MG tablet TAKE 1 TABLET (10 MG TOTAL) BY MOUTH DAILY. 30 tablet 2   glimepiride (AMARYL) 1 MG tablet TAKE 1 TABLET BY MOUTH DAILY WITH BREAKFAST. (Must keep upcoming office visit for refills) 30 tablet 0   glucose blood (TRUE METRIX BLOOD GLUCOSE TEST) test strip Use as instructed 100 each 2   levETIRAcetam (KEPPRA) 500 MG tablet TAKE 1 TABLET (500 MG TOTAL) BY MOUTH 2 (TWO) TIMES DAILY. 60 tablet 2   meclizine (ANTIVERT) 12.5 MG tablet Take  1 tablet (12.5 mg total) by mouth 2 (two) times daily as needed for dizziness. 30 tablet 3   methylPREDNISolone (MEDROL DOSEPAK) 4 MG TBPK tablet Take as directed 21 tablet 0   pravastatin (PRAVACHOL) 10 MG tablet Take 1 tablet by mouth on Monday, Wednesday, and Friday. 30 tablet 6   ticagrelor (BRILINTA) 90 MG TABS tablet TAKE 1 TABLET (90 MG TOTAL) BY MOUTH 2 (TWO) TIMES DAILY. 60 tablet 3   traMADol (ULTRAM) 50 MG tablet Take by mouth every 6 (six) hours as needed.     triamcinolone cream (KENALOG) 0.1 % Apply cream to affected area(s) (two) times daily. Apply to rash on lower abdomen 30 g 0   TRUEplus Lancets 28G MISC Use 4 times daily  120 each 3   No current facility-administered medications on file prior to visit.    No Known Allergies  Social History   Socioeconomic History   Marital status: Single    Spouse name: Not on file   Number of children: Not on file   Years of education: Not on file   Highest education level: Not on file  Occupational History   Not on file  Tobacco Use   Smoking status: Former    Packs/day: 0.50    Years: 30.00    Total pack years: 15.00    Types: Cigarettes    Quit date: 12/04/2017    Years since quitting: 4.4   Smokeless tobacco: Never  Vaping Use   Vaping Use: Never used  Substance and Sexual Activity   Alcohol use: Not Currently    Comment: weekly   Drug use: No   Sexual activity: Not Currently    Birth control/protection: None  Other Topics Concern   Not on file  Social History Narrative   Not on file   Social Determinants of Health   Financial Resource Strain: Not on file  Food Insecurity: Not on file  Transportation Needs: Not on file  Physical Activity: Not on file  Stress: Not on file  Social Connections: Not on file  Intimate Partner Violence: Not on file    Family History  Problem Relation Age of Onset   Diabetes Mother    Pulmonary disease Mother    Heart disease Father    Stroke Sister     Past Surgical History:  Procedure Laterality Date   BREAST EXCISIONAL BIOPSY     BREAST SURGERY     right breast   CHOLECYSTECTOMY     IR ANGIO INTRA EXTRACRAN SEL COM CAROTID INNOMINATE BILAT MOD SED  03/06/2018   IR ANGIO INTRA EXTRACRAN SEL COM CAROTID INNOMINATE BILAT MOD SED  11/20/2018   IR ANGIO INTRA EXTRACRAN SEL COM CAROTID INNOMINATE BILAT MOD SED  10/31/2019   IR ANGIO VERTEBRAL SEL VERTEBRAL BILAT MOD SED  03/06/2018   IR ANGIO VERTEBRAL SEL VERTEBRAL BILAT MOD SED  11/20/2018   IR ANGIO VERTEBRAL SEL VERTEBRAL BILAT MOD SED  10/31/2019   IR CT HEAD LTD  12/18/2017   IR INTRA CRAN STENT  12/18/2017   IR PERCUTANEOUS ART THROMBECTOMY/INFUSION  INTRACRANIAL INC DIAG ANGIO  12/18/2017   IR RADIOLOGIST EVAL & MGMT  01/31/2018   IR US GUIDE VASC ACCESS RIGHT  10/31/2019   RADIOLOGY WITH ANESTHESIA N/A 12/18/2017   Procedure: RADIOLOGY WITH ANESTHESIA;  Surgeon: Luanne Bras, MD;  Location: [facility];  Service: Radiology;  Laterality: N/A;    ROS: Review of Systems Negative except as stated above  PHYSICAL EXAM: BP 110/79 (BP Location:  Left Arm, Patient Position: Sitting, Cuff Size: Large)   Pulse 85   Temp 98.6 F (37 C) (Oral)   Resp 20   Ht '5\' 6"'  (1.676 m)   Wt 247 lb (112 kg)   SpO2 98%   BMI 39.87 kg/m   Wt Readings from Last 3 Encounters:  05/03/22 247 lb (112 kg)  01/06/22 242 lb 6.4 oz (110 kg)  11/22/21 244 lb 3.2 oz (110.8 kg)    Physical Exam  General appearance - alert, well appearing, and in no distress Neck - supple, no significant adenopathy Chest - clear to auscultation, no wheezes, rales or rhonchi, symmetric air entry Heart - normal rate, regular rhythm, normal S1, S2, no murmurs, rubs, clicks or gallops Extremities - peripheral pulses normal, no pedal edema, no clubbing or cyanosis  Results for orders placed or performed in visit on 05/03/22  Glucose (CBG)  Result Value Ref Range   POC Glucose 116 (A) 70 - 99 mg/dl  HgB A1c  Result Value Ref Range   Hemoglobin A1C 5.6 4.0 - 5.6 %   HbA1c POC (<> result, manual entry)     HbA1c, POC (prediabetic range)     HbA1c, POC (controlled diabetic range)    POCT URINALYSIS DIP (CLINITEK)  Result Value Ref Range   Color, UA yellow yellow   Clarity, UA clear clear   Glucose, UA negative negative mg/dL   Bilirubin, UA negative negative   Ketones, POC UA negative negative mg/dL   Spec Grav, UA 1.020 1.010 - 1.025   Blood, UA negative negative   pH, UA 5.5 5.0 - 8.0   POC PROTEIN,UA negative negative, trace   Urobilinogen, UA 0.2 0.2 or 1.0 E.U./dL   Nitrite, UA Negative Negative   Leukocytes, UA Trace (A) Negative      05/03/2022    3:16 PM 01/06/2022     8:52 AM 11/22/2021   11:02 AM  Depression screen PHQ 2/9  Decreased Interest 0 0 0  Down, Depressed, Hopeless 0 0 0  PHQ - 2 Score 0 0 0  Altered sleeping 2 0 0  Tired, decreased energy 1 0 0  Change in appetite 1 0 0  Feeling bad or failure about yourself  0 0 0  Trouble concentrating 0 0 0  Moving slowly or fidgety/restless 0 0 0  Suicidal thoughts 0 0 0  PHQ-9 Score 4 0 0       Latest Ref Rng & Units 03/17/2021    1:51 PM 06/16/2020   11:40 AM 10/31/2019   10:35 AM  CMP  Glucose 70 - 99 mg/dL 128  93  100   BUN 8 - 23 mg/dL '10  13  16   ' Creatinine 0.44 - 1.00 mg/dL 0.53  0.53  0.30   Sodium 135 - 145 mmol/L 138  140  143   Potassium 3.5 - 5.1 mmol/L 4.2  4.8  4.5   Chloride 98 - 111 mmol/L 106  103  108   CO2 22 - 32 mmol/L 25  24    Calcium 8.9 - 10.3 mg/dL 8.6  9.3    Total Protein 6.0 - 8.5 g/dL  6.9    Total Bilirubin 0.0 - 1.2 mg/dL  0.4    Alkaline Phos 44 - 121 IU/L  95    AST 0 - 40 IU/L  20    ALT 0 - 32 IU/L  17     Lipid Panel     Component Value Date/Time  CHOL 172 06/03/2021 1111   TRIG 95 06/03/2021 1111   HDL 56 06/03/2021 1111   CHOLHDL 3.1 06/03/2021 1111   CHOLHDL 2.5 03/24/2019 0611   VLDL 15 03/24/2019 0611   LDLCALC 99 06/03/2021 1111    CBC    Component Value Date/Time   WBC 7.3 03/17/2021 1351   RBC 4.78 03/17/2021 1351   HGB 14.5 03/17/2021 1351   HGB 14.1 06/16/2020 1140   HCT 45.3 03/17/2021 1351   HCT 43.6 06/16/2020 1140   PLT 285 03/17/2021 1351   PLT 273 06/16/2020 1140   MCV 94.8 03/17/2021 1351   MCV 91 06/16/2020 1140   MCH 30.3 03/17/2021 1351   MCHC 32.0 03/17/2021 1351   RDW 13.2 03/17/2021 1351   RDW 12.4 06/16/2020 1140   LYMPHSABS 2.1 03/17/2021 1351   MONOABS 0.5 03/17/2021 1351   EOSABS 0.1 03/17/2021 1351   BASOSABS 0.0 03/17/2021 1351    ASSESSMENT AND PLAN: 1. Diabetes mellitus type 2 in obese (HCC) Controlled on Amaryl low-dose.  Discussed and encouraged healthy eating habits.  Encouraged her to  move as much as she can. - Glucose (CBG) - HgB A1c - Comprehensive metabolic panel  2. Dysuria UA negative for UTI. - POCT URINALYSIS DIP (CLINITEK)  3. Mixed hyperlipidemia Since patient has a hard time tolerating statins, will refer to advanced lipid clinic - AMB Referral to [facility] - Lipid panel - Comprehensive metabolic panel  4. History of cardioembolic cerebrovascular accident (CVA) Continue Brilinta. - Ambulatory referral to Neurology - AMB Referral to [facility]  5. Mild major depression (HCC) Continue Lexapro.  We discussed adding low-dose of trazodone to help with sleep at night.  Discussed referral for counseling and patient was agreeable to that.  Denies any suicidal ideation at this time. - traZODone (DESYREL) 50 MG tablet; Take 1/2  tablet by mouth nightly  Dispense: 15 tablet; Refill: 3 - Ambulatory referral to Psychiatry   [employer] Language interpreter used during this encounter. #[id number], [place]  Patient was given the opportunity to ask questions.  Patient verbalized understanding of the plan and was able to repeat key elements of the plan.   This documentation was completed using Radio producer.  Any transcriptional errors are unintentional.  Orders Placed This Encounter  Procedures   Lipid panel   Comprehensive metabolic panel   Ambulatory referral to Neurology   AMB Referral to [facility]   Ambulatory referral to Psychiatry   Glucose (CBG)   HgB A1c   POCT URINALYSIS DIP (CLINITEK)     Requested Prescriptions   Signed Prescriptions Disp Refills   traZODone (DESYREL) 50 MG tablet 15 tablet 3    Sig: Take 1/2  tablet by mouth nightly    Return in about 4 months (around 09/02/2022).  Karle Plumber, MD, FACP

## 2022-05-04 LAB — COMPREHENSIVE METABOLIC PANEL
ALT: 12 IU/L (ref 0–32)
AST: 20 IU/L (ref 0–40)
Albumin/Globulin Ratio: 1.5 (ref 1.2–2.2)
Albumin: 4.1 g/dL (ref 3.9–4.9)
Alkaline Phosphatase: 81 IU/L (ref 44–121)
BUN/Creatinine Ratio: 22 (ref 12–28)
BUN: 12 mg/dL (ref 8–27)
Bilirubin Total: 0.4 mg/dL (ref 0.0–1.2)
CO2: 23 mmol/L (ref 20–29)
Calcium: 9.5 mg/dL (ref 8.7–10.3)
Chloride: 101 mmol/L (ref 96–106)
Creatinine, Ser: 0.55 mg/dL — ABNORMAL LOW (ref 0.57–1.00)
Globulin, Total: 2.7 g/dL (ref 1.5–4.5)
Glucose: 106 mg/dL — ABNORMAL HIGH (ref 70–99)
Potassium: 5.1 mmol/L (ref 3.5–5.2)
Sodium: 141 mmol/L (ref 134–144)
Total Protein: 6.8 g/dL (ref 6.0–8.5)
eGFR: 101 mL/min/{1.73_m2} (ref >59)

## 2022-05-04 LAB — LIPID PANEL
Chol/HDL Ratio: 2.7 ratio (ref 0.0–4.4)
Cholesterol, Total: 164 mg/dL (ref 100–199)
HDL: 60 mg/dL (ref >39)
LDL Chol Calc (NIH): 91 mg/dL (ref 0–99)
Triglycerides: 67 mg/dL (ref 0–149)
VLDL Cholesterol Cal: 13 mg/dL (ref 5–40)

## 2022-05-18 ENCOUNTER — Other Ambulatory Visit: Payer: Self-pay | Admitting: Internal Medicine

## 2022-05-19 ENCOUNTER — Other Ambulatory Visit: Payer: Self-pay

## 2022-05-19 MED ORDER — GLIMEPIRIDE 1 MG PO TABS
ORAL_TABLET | Freq: Every day | ORAL | 0 refills | Status: DC
  Filled 2022-05-19: qty 90, 90d supply, fill #0

## 2022-05-19 NOTE — Telephone Encounter (Signed)
Requested Prescriptions  Pending Prescriptions Disp Refills  . glimepiride (AMARYL) 1 MG tablet 30 tablet 0    Sig: TAKE 1 TABLET BY MOUTH DAILY WITH BREAKFAST. (Must keep upcoming office visit for refills)     Endocrinology:  Diabetes - Sulfonylureas Failed - 05/18/2022  9:14 PM      Failed - Cr in normal range and within 360 days    Creat  Date Value Ref Range Status  09/05/2016 0.53 0.50 - 0.99 mg/dL Final    Comment:      For patients > or = 67 years of age: The upper reference limit for Creatinine is approximately 13% higher for people identified as African-American.      Creatinine, Ser  Date Value Ref Range Status  05/03/2022 0.55 (L) 0.57 - 1.00 mg/dL Final         Passed - HBA1C is between 0 and 7.9 and within 180 days    Hemoglobin A1C  Date Value Ref Range Status  05/03/2022 5.6 4.0 - 5.6 % Final   HbA1c, POC (prediabetic range)  Date Value Ref Range Status  02/20/2018 6.3 5.7 - 6.4 % Final   HbA1c, POC (controlled diabetic range)  Date Value Ref Range Status  11/22/2021 5.5 0.0 - 7.0 % Final         Passed - Valid encounter within last 6 months    Recent Outpatient Visits          2 weeks ago Diabetes mellitus type 2 in obese [facility])   [facility] Marcine Matar, MD   4 months ago Plantar fasciitis   [facility] [place], [place], [place]   5 months ago Diabetes mellitus type 2 in obese [facility])   [facility] Jonah Blue B, MD   6 months ago Diabetes mellitus type 2 in obese [facility])   [facility] [place], Marylene Land M, [place]   10 months ago Pap smear for cervical cancer screening   [facility] Marcine Matar, MD      Future Appointments            In 3 months Laural Benes, Binnie Rail, MD [facility]

## 2022-05-20 ENCOUNTER — Other Ambulatory Visit: Payer: Self-pay

## 2022-05-23 ENCOUNTER — Other Ambulatory Visit: Payer: Self-pay

## 2022-06-09 NOTE — Progress Notes (Unsigned)
Heather [name] [address] [place]. [place] [address] [phone]       OFFICE FOLLOW-UP NOTE  [name] Date of Birth:  02/20/1956 Medical Record Number:  [id number]    Reason for visit: Stroke and seizure follow-up   Chief complaint: No chief complaint on file.     HPI:   Update 06/13/2022 JM: Patient returns for yearly seizure and stroke follow-up visit accompanied by Heather [name] health interpreter.  Denies any new stroke/TIA symptoms or seizure activity.  Compliant on Keppra 500 mg twice daily, denies side effects.  She did have follow-up with Dr. Estanislado [name] back in October with repeat MRA showing patent stent and no other new or progressive findings.  She has remained on Brilinta and Crestor.  Blood pressure ***.        History provided for reference purposes only Update 05/13/2021 JM: Returns for yearly routine follow-up visit accompanied by [facility] interpreter.  Reports over the past 6 months, occasional left mouth tension/pulling sensation usually in the evenings.  Denies any other associated symptoms such as visual changes, headache, pain, speech difficulties or focal weakness.  Continued chronic intermittent tension type frontal headaches more recently on the right side likely due to dental carries but unable to have tooth removal (per pt) due to use of Brilinta. Repeat MRA 07/2020 patent R M1 stent -recommended 66-monthfollow-up imaging with Dr. DEstanislado [name] Not yet completed - she is requesting a referral to IR for repeat imaging.  Recently ran out of Crestor previously taking without side effects.  Blood pressure today 131/89.  Remains on Keppra 500 mg twice daily tolerating without recurrent seizure activity.  No further concerns at this time.   Update 05/12/2020 JM: Ms. Heather Aspenreturns for stroke follow-up.  She is accompanied by interpreter  Stable since prior visit without new stroke/TIA symptoms Continues on aspirin and  Brilinta (post stent per IR) with easy bruising but no bleeding Continue on Crestor without myalgias Blood pressure today 115/77  No reoccurring seizure type activity or events Continues on Keppra 500 mg twice daily without side effects  She does report chronic intermittent vertigo and requesting refill of meclizine   Update 11/13/2019:  Stable.  Remains on Keppra without seizure activity ?  Atorvastatin side effect therefore Crestor initiated Blood pressure stable Cerebral angiogram 10/31/2019 50 to 70% patency of the stented segment of right MCA and recommended MRA in 6 months time.   Continues on aspirin and Brilinta as instructed by IR without bleeding or bruising.     05/29/2019 update:  New onset seizure 03/23/2019 -ED eval with reported tonic-clonic seizure Likely late effect of stroke Initiated Keppra without reoccurring seizure activity  Update 11/26/2018 :  Single episode of right-sided facial twitching lasting approximately 10 minutes of unknown etiology CTA 50% IntraStent stenosis  Cerebral angiogram 30 to 40% IntraStent restenosis EEG negative MRI no acute abnormality  Update 08/16/2018:  Stable C/o right-sided intermittent neck pain evaluated by PCP Compliant with aspirin and Brilinta  05/16/2018 visit:  Mild diminished fine motor skills left hand post stroke Patient reported 85% recovery Cerebral angiogram 03/06/2018 50% right MCA IntraStent stenosis  Stroke admission 12/2017: Heather [name] an 66y.o. female Hispanic with PMH of HTN,Obesity who [facility] as a stroke alert with left hemiparesis. Patient is Spanish-speaking and initially EMS was told that her symptoms began at 9 PM after she dropped a glass in her left hand. CT head showed no hemorrhage and tPA  was mixed. However after nurse spoke to her in [place], patient stated that she had a headache that began around 6:30pm and had left side weakness as she was outside window period upon  completion of CT scan.   CTA head and neck was performed which showed a superior division M2 cutoff. After discussing with family given her borderline NIHSS of 7, however worsening symptoms ( from EMS assessment to arrival at hospital)  we decided to take the patient for mechanical thrombectomy.  Date last known well:3.17.19Time last known well: around  6.30 pm tPA Given: no, outside window at time of CT scan NIHSS: 7 ( at time of arrival)  Baseline MRS 0  . After written informed consent from the patient and family She was taken to the interventional radiology suite for thrombectomy and right middle cerebral artery  Rescue stent placement performed by Dr. Estanislado [name] for progressive occlusion of the inferior division of the right middle cerebral artery due to underlying stenosis. She also received 10 mg of superselective into arterial Integrilin. She was given loading dose of 180 mg of Brilinta and aspirin. The patient was initially and in the neurological intensive care unit and closely monitored she did well. MRI scan of the brain showed a moderate-sized right MCA infarct.Marland Kitchen LDL cholesterol 67 mg percent and women A1c was 6.7. Transthoracic echo showed normal ejection fraction. Patient had initial right gaze preference and left hemiplegia but made gradual improvement. She was seen by physical occupational and speech therapy. She was transferred to inpatient rehabilitation and has done well.      ROS:   14 system review of systems is positive for those listed in HPI and all other systems negative  PMH:  Past Medical History:  Diagnosis Date   Depression    Diabetes ([place])    Hypertension    Seizure ([place])    Stroke ([place])     Social History:  Social History   Socioeconomic History   Marital status: Single    Spouse name: Not on file   Number of children: Not on file   Years of education: Not on file   Highest education level: Not on file  Occupational History   Not on file  Tobacco Use    Smoking status: Former    Packs/day: 0.50    Years: 30.00    Total pack years: 15.00    Types: Cigarettes    Quit date: 12/04/2017    Years since quitting: 4.5   Smokeless tobacco: Never  Vaping Use   Vaping Use: Never used  Substance and Sexual Activity   Alcohol use: Not Currently    Comment: weekly   Drug use: No   Sexual activity: Not Currently    Birth control/protection: None  Other Topics Concern   Not on file  Social History Narrative   Not on file   Social Determinants of Health   Financial Resource Strain: Not on file  Food Insecurity: Not on file  Transportation Needs: Not on file  Physical Activity: Not on file  Stress: Not on file  Social Connections: Not on file  Intimate Partner Violence: Not on file    Medications:   Current Outpatient Medications on File Prior to Visit  Medication Sig Dispense Refill   acetaminophen (TYLENOL) 500 MG tablet Take 1,000 mg by mouth every 8 (eight) hours as needed for moderate pain or headache.     Blood Glucose Monitoring Suppl (TRUE METRIX METER) w/Device KIT 1 kit by Does not apply  route 4 (four) times daily. 1 kit 0   Cholecalciferol (VITAMIN D3 PO) Take 1 tablet by mouth daily.     diclofenac Sodium (VOLTAREN) 1 % GEL Apply 2 g topically 2 (two) times daily as needed (apply to knees). 100 g 1   escitalopram (LEXAPRO) 10 MG tablet TAKE 1 TABLET (10 MG TOTAL) BY MOUTH DAILY. 30 tablet 2   glimepiride (AMARYL) 1 MG tablet TAKE 1 TABLET BY MOUTH DAILY WITH BREAKFAST. (Must keep upcoming office visit for refills) 90 tablet 0   glucose blood (TRUE METRIX BLOOD GLUCOSE TEST) test strip Use as instructed 100 each 2   levETIRAcetam (KEPPRA) 500 MG tablet TAKE 1 TABLET (500 MG TOTAL) BY MOUTH 2 (TWO) TIMES DAILY. 60 tablet 2   meclizine (ANTIVERT) 12.5 MG tablet Take 1 tablet (12.5 mg total) by mouth 2 (two) times daily as needed for dizziness. 30 tablet 3   methylPREDNISolone (MEDROL DOSEPAK) 4 MG TBPK tablet Take as directed 21  tablet 0   pravastatin (PRAVACHOL) 10 MG tablet Take 1 tablet by mouth on Monday, Wednesday, and Friday. 30 tablet 6   ticagrelor (BRILINTA) 90 MG TABS tablet TAKE 1 TABLET (90 MG TOTAL) BY MOUTH 2 (TWO) TIMES DAILY. 60 tablet 3   traMADol (ULTRAM) 50 MG tablet Take by mouth every 6 (six) hours as needed.     traZODone (DESYREL) 50 MG tablet Take 1/2  tablet by mouth nightly 15 tablet 3   triamcinolone cream (KENALOG) 0.1 % Apply cream to affected area(s) (two) times daily. Apply to rash on lower abdomen 30 g 0   TRUEplus Lancets 28G MISC Use 4 times daily 120 each 3   No current facility-administered medications on file prior to visit.    Allergies:  No Known Allergies  Physical Exam  There were no vitals filed for this visit.   There is no height or weight on file to calculate BMI.   General: Well-nourished, well-developed pleasant middle-age  Hispanic Spanish-speaking lady, seated, in no evident distress Neck: supple with no carotid or supraclavicular bruits Cardiovascular: regular rate and rhythm, no murmurs Vascular:  Normal pulses all extremities  Neurologic Exam Mental Status: Awake and fully alert.  Primary Spanish speaking.  Denies speech or language difficulty.  Oriented to place and time. Recent and remote memory intact. Attention span, concentration and fund of knowledge appropriate. Mood and affect appropriate.  Cranial Nerves: Pupils equal, briskly reactive to light. Extraocular movements full without nystagmus. Visual fields full to confrontation. Hearing intact. Facial sensation intact.  Mild left facial asymmetry when smiling.  Tongue and palate moves normally and symmetrically.  Motor: Normal bulk and tone. Normal strength in all tested extremity muscles.  Sensory.: intact to touch ,pinprick .position and vibratory sensation.  Coordination: Rapid alternating movements normal in all extremities. Finger-to-nose and heel-to-shin performed accurately bilaterally. Gait and  Station: Arises from chair without difficulty. Stance is normal. Gait demonstrates normal stride length and balance without use of assistive device. Able to heel, toe and tandem walk without difficulty.  Reflexes: 1+ and symmetric. Toes downgoing.      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 Vascular risk factors of intracranial stenosis, diabetes, hypertension and hyperlipidemia.  Prior hospitalization 11/18/2018 with transient facial twitchings of unclear etiology. Presented to ED on 03/23/2019 with tonic-clonic seizure activity with Keppra initiated.  1.  Right MCA infract s/p MCA stent -Intermittent left facial "tightness/pulling" over the past 6 months with mild left facial asymmetry on exam - likely from prior stroke but needs to schedule f/u with Dr. Estanislado [name] for repeat imaging as prior imaging 07/2020 and recommended 71-monthfollow up imaging not yet completed - will reach out to IR to further assist. Low suspicion symptoms in setting of new or worsening stroke as no other focal deficits appreciated although will be ruled out with f/u imaging with Dr. DEstanislado [name] Of note, eval 11/2018 for transient episode of right facial drawing and twitching with full work-up unremarkable. Discussed importance of proceeding to ED immediately with any new or reoccurring stroke/TIA symptoms for further evaluation - Continue Brilinta and restart Crestor 5 mg daily for secondary stroke prevention -Close PCP follow-up for aggressive stroke risk factor management including HTN with BP goal<130/90, HLD with LDL goal<70 and DM with A1c goal<7 - PCP f/u scheduled end of month with plans for repeat labs per pt  2.  Seizure, post stroke -No recurrent seizure type activity or symptoms -Continue taking Keppra 500 mg BID for seizure prophylaxis -  refill provided by PCP  3.  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 DEstanislado Pandyto undergo dental procedure     Follow-up in 1 year or call earlier if needed     CC:  JLadell Pier MD    I spent 36 minutes of face-to-face and non-face-to-face time with patient assisted by interpreter.  This included previsit chart review, lab review, study review, order entry, electronic health record documentation, patient education regarding history of right MCA stroke, post stroke seizures, intracranial stenosis, secondary stroke prevention measures and importance of managing stroke risk factors, chronic tension headaches and visual symptoms and answered all other questions to patient satisfaction  JFrann Rider AGNP-BC  [facility]De[name] [address]SClyde HillGEagar [place] [id number] Phone [phone] [phone]: This document was prepared with digital dictation and possible smart phrase technology. Any transcriptional errors that result from this process are unintentional.

## 2022-06-11 ENCOUNTER — Other Ambulatory Visit: Payer: Self-pay | Admitting: Internal Medicine

## 2022-06-13 ENCOUNTER — Encounter: Payer: Self-pay | Admitting: Adult Health

## 2022-06-13 ENCOUNTER — Ambulatory Visit (INDEPENDENT_AMBULATORY_CARE_PROVIDER_SITE_OTHER): Payer: Self-pay | Admitting: Adult Health

## 2022-06-13 ENCOUNTER — Other Ambulatory Visit: Payer: Self-pay | Admitting: Internal Medicine

## 2022-06-13 ENCOUNTER — Other Ambulatory Visit: Payer: Self-pay

## 2022-06-13 VITALS — BP 154/94 | HR 83 | Ht 67.0 in | Wt 246.0 lb

## 2022-06-13 DIAGNOSIS — R569 Unspecified convulsions: Secondary | ICD-10-CM

## 2022-06-13 DIAGNOSIS — I69398 Other sequelae of cerebral infarction: Secondary | ICD-10-CM

## 2022-06-13 DIAGNOSIS — E1169 Type 2 diabetes mellitus with other specified complication: Secondary | ICD-10-CM

## 2022-06-13 DIAGNOSIS — E669 Obesity, unspecified: Secondary | ICD-10-CM

## 2022-06-13 DIAGNOSIS — I63311 Cerebral infarction due to thrombosis of right middle cerebral artery: Secondary | ICD-10-CM

## 2022-06-13 MED ORDER — LEVETIRACETAM 500 MG PO TABS
500.0000 mg | ORAL_TABLET | Freq: Two times a day (BID) | ORAL | 4 refills | Status: DC
  Filled 2022-06-13: qty 180, 90d supply, fill #0
  Filled 2022-06-30 – 2022-07-12 (×2): qty 180, 90d supply, fill #1

## 2022-06-13 MED ORDER — ESCITALOPRAM OXALATE 10 MG PO TABS
ORAL_TABLET | Freq: Every day | ORAL | 2 refills | Status: DC
  Filled 2022-06-13: qty 30, 30d supply, fill #0
  Filled 2022-06-30 – 2022-07-12 (×2): qty 30, 30d supply, fill #1
  Filled 2022-08-17 – 2022-10-07 (×2): qty 30, 30d supply, fill #2

## 2022-06-13 NOTE — Patient Instructions (Addendum)
Your Plan:  Continue keppra 500mg  twice daily for seizure prevention   Can try over the counter medications for allergies such as Allegra or Zyrtec. If no benefit, would recommend you follow up with your primary care provider for further recommendations   Follow up with Dr. around October for repeat imaging - if you do not hear from them by mid October, please call them to schedule at [phone]     Follow up in 1 year or call earlier if needed      Thank you for coming to see [phone] at [facility]. I hope we have been able to provide you high quality care today.  You may receive a patient satisfaction survey over the next few weeks. We would appreciate your feedback and comments so that we may continue to improve ourselves and the health of our patients.

## 2022-06-14 ENCOUNTER — Other Ambulatory Visit: Payer: Self-pay

## 2022-06-14 MED ORDER — TRUEPLUS LANCETS 28G MISC
28.0000 g | Freq: Four times a day (QID) | 3 refills | Status: DC
  Filled 2022-06-14: qty 100, 25d supply, fill #0
  Filled 2022-07-12: qty 100, 25d supply, fill #1
  Filled 2022-08-09: qty 100, 25d supply, fill #2
  Filled 2022-10-07: qty 100, 25d supply, fill #3

## 2022-06-15 ENCOUNTER — Other Ambulatory Visit: Payer: Self-pay

## 2022-06-23 ENCOUNTER — Telehealth: Payer: Self-pay | Admitting: Emergency Medicine

## 2022-06-23 DIAGNOSIS — Z8673 Personal history of transient ischemic attack (TIA), and cerebral infarction without residual deficits: Secondary | ICD-10-CM

## 2022-06-23 DIAGNOSIS — I669 Occlusion and stenosis of unspecified cerebral artery: Secondary | ICD-10-CM

## 2022-06-23 DIAGNOSIS — I679 Cerebrovascular disease, unspecified: Secondary | ICD-10-CM

## 2022-06-23 NOTE — Telephone Encounter (Signed)
Copied from [place] [phone]. Topic: Appointment Scheduling - Scheduling Inquiry for Clinic >> Jun 23, 2022 10:41 AM Penni Bombard wrote: Reason for CRM: pt is needing an order for her yearly mri for a stroke she had.  CB@  [phone]

## 2022-06-30 ENCOUNTER — Other Ambulatory Visit: Payer: Self-pay | Admitting: Internal Medicine

## 2022-06-30 ENCOUNTER — Other Ambulatory Visit: Payer: Self-pay

## 2022-06-30 MED ORDER — GLIMEPIRIDE 1 MG PO TABS
1.0000 mg | ORAL_TABLET | Freq: Every day | ORAL | 0 refills | Status: DC
  Filled 2022-06-30: qty 90, fill #0
  Filled 2022-07-12 – 2022-08-09 (×3): qty 90, 90d supply, fill #0

## 2022-06-30 NOTE — Telephone Encounter (Signed)
LOV 05/03/2022 in regards to notes saying she needs an OV.  Renewed for 90 days  Requested Prescriptions  Pending Prescriptions Disp Refills  . glimepiride (AMARYL) 1 MG tablet 90 tablet 0    Sig: TAKE 1 TABLET BY MOUTH DAILY WITH BREAKFAST. (Must keep upcoming office visit for refills)     Endocrinology:  Diabetes - Sulfonylureas Failed - 06/30/2022 11:16 AM      Failed - Cr in normal range and within 360 days    Creat  Date Value Ref Range Status  09/05/2016 0.53 0.50 - 0.99 mg/dL Final    Comment:      For patients > or = 67 years of age: The upper reference limit for Creatinine is approximately 13% higher for people identified as African-American.      Creatinine, Ser  Date Value Ref Range Status  05/03/2022 0.55 (L) 0.57 - 1.00 mg/dL Final         Passed - HBA1C is between 0 and 7.9 and within 180 days    Hemoglobin A1C  Date Value Ref Range Status  05/03/2022 5.6 4.0 - 5.6 % Final   HbA1c, POC (prediabetic range)  Date Value Ref Range Status  02/20/2018 6.3 5.7 - 6.4 % Final   HbA1c, POC (controlled diabetic range)  Date Value Ref Range Status  11/22/2021 5.5 0.0 - 7.0 % Final         Passed - Valid encounter within last 6 months    Recent Outpatient Visits          1 month ago Diabetes mellitus type 2 in obese [facility])   [place], Deborah B, MD   5 months ago Plantar fasciitis   [place], [place], [place]   7 months ago Diabetes mellitus type 2 in obese [facility])   [place], MD   7 months ago Diabetes mellitus type 2 in obese [facility])   [place], Levada Dy M, [place]   11 months ago Pap smear for cervical cancer screening   [place], Deborah B, MD      Future Appointments            In 2 months Wynetta Emery Dalbert Batman, MD [place]    In 3 months Hilty, Nadean Corwin, MD [facility]

## 2022-07-04 ENCOUNTER — Other Ambulatory Visit: Payer: Self-pay

## 2022-07-06 NOTE — Telephone Encounter (Signed)
Pt informed that referral sent and that Dr.  Estanislado Pandy office will call to schedule appt.Pt expressed understanding. -----DD,RMA

## 2022-07-06 NOTE — Addendum Note (Signed)
Addended by: Karle Plumber B on: 07/06/2022 02:17 PM   Modules accepted: Orders

## 2022-07-06 NOTE — Telephone Encounter (Signed)
Please advise.----DD,RMA  

## 2022-07-12 ENCOUNTER — Other Ambulatory Visit: Payer: Self-pay

## 2022-07-14 ENCOUNTER — Telehealth: Payer: Self-pay | Admitting: Emergency Medicine

## 2022-07-14 NOTE — Telephone Encounter (Signed)
Copied from [place] [phone]. Topic: General - Other >> Jul 14, 2022  8:49 AM Chapman Fitch wrote: Reason for CRM: Pts daughter called to see if MRI was scheduled for pt or if she needs to call to schedule / please advise on MRI/ referral asap

## 2022-07-18 ENCOUNTER — Other Ambulatory Visit: Payer: Self-pay | Admitting: Internal Medicine

## 2022-07-18 ENCOUNTER — Encounter (HOSPITAL_COMMUNITY): Payer: Self-pay | Admitting: Emergency Medicine

## 2022-07-18 ENCOUNTER — Emergency Department (HOSPITAL_COMMUNITY): Payer: No Typology Code available for payment source

## 2022-07-18 ENCOUNTER — Emergency Department (HOSPITAL_COMMUNITY)
Admission: EM | Admit: 2022-07-18 | Discharge: 2022-07-19 | Discharge status: Against Medical Advice | Payer: Self-pay | Attending: Student | Admitting: Student

## 2022-07-18 ENCOUNTER — Other Ambulatory Visit: Payer: Self-pay

## 2022-07-18 DIAGNOSIS — R002 Palpitations: Secondary | ICD-10-CM | POA: Insufficient documentation

## 2022-07-18 DIAGNOSIS — R1013 Epigastric pain: Secondary | ICD-10-CM | POA: Insufficient documentation

## 2022-07-18 DIAGNOSIS — Z5321 Procedure and treatment not carried out due to patient leaving prior to being seen by health care provider: Secondary | ICD-10-CM | POA: Insufficient documentation

## 2022-07-18 DIAGNOSIS — R079 Chest pain, unspecified: Secondary | ICD-10-CM | POA: Insufficient documentation

## 2022-07-18 LAB — CBC WITH DIFFERENTIAL/PLATELET
Abs Immature Granulocytes: 0.04 10*3/uL (ref 0.00–0.07)
Basophils Absolute: 0 10*3/uL (ref 0.0–0.1)
Basophils Relative: 0 %
Eosinophils Absolute: 0.1 10*3/uL (ref 0.0–0.5)
Eosinophils Relative: 1 %
HCT: 45 % (ref 36.0–46.0)
Hemoglobin: 14.9 g/dL (ref 12.0–15.0)
Immature Granulocytes: 0 %
Lymphocytes Relative: 27 %
Lymphs Abs: 2.8 10*3/uL (ref 0.7–4.0)
MCH: 31.2 pg (ref 26.0–34.0)
MCHC: 33.1 g/dL (ref 30.0–36.0)
MCV: 94.1 fL (ref 80.0–100.0)
Monocytes Absolute: 0.6 10*3/uL (ref 0.1–1.0)
Monocytes Relative: 6 %
Neutro Abs: 6.6 10*3/uL (ref 1.7–7.7)
Neutrophils Relative %: 66 %
Platelets: 280 10*3/uL (ref 150–400)
RBC: 4.78 MIL/uL (ref 3.87–5.11)
RDW: 12.6 % (ref 11.5–15.5)
WBC: 10.1 10*3/uL (ref 4.0–10.5)
nRBC: 0 % (ref 0.0–0.2)

## 2022-07-18 LAB — COMPREHENSIVE METABOLIC PANEL
ALT: 18 U/L (ref 0–44)
AST: 25 U/L (ref 15–41)
Albumin: 3.7 g/dL (ref 3.5–5.0)
Alkaline Phosphatase: 69 U/L (ref 38–126)
Anion gap: 9 (ref 5–15)
BUN: 11 mg/dL (ref 8–23)
CO2: 24 mmol/L (ref 22–32)
Calcium: 9.4 mg/dL (ref 8.9–10.3)
Chloride: 103 mmol/L (ref 98–111)
Creatinine, Ser: 0.53 mg/dL (ref 0.44–1.00)
GFR, Estimated: >60 mL/min (ref >60)
Glucose, Bld: 85 mg/dL (ref 70–99)
Potassium: 4.1 mmol/L (ref 3.5–5.1)
Sodium: 136 mmol/L (ref 135–145)
Total Bilirubin: 0.4 mg/dL (ref 0.3–1.2)
Total Protein: 7.2 g/dL (ref 6.5–8.1)

## 2022-07-18 LAB — TROPONIN I (HIGH SENSITIVITY): Troponin I (High Sensitivity): 3 ng/L (ref <18)

## 2022-07-18 LAB — LIPASE, BLOOD: Lipase: 46 U/L (ref 11–51)

## 2022-07-18 NOTE — ED Triage Notes (Signed)
Patient here with complaint of heart palpitations that started yesterday after eating a sandwich at 1400. Patient denies nausea, denies vomiting. Patient is alert, oriented, and in no apparent distress at this time.

## 2022-07-18 NOTE — ED Provider Triage Note (Cosign Needed Addendum)
Emergency Medicine Provider Triage Evaluation Note  [facility] Heather [name] , a 66 y.o. female  was evaluated in triage.  Pt complains of palpitations.  Patient states that yesterday afternoon she ate a [employer] sandwich.  She states that after that she began having chest palpitations, epigastric abdominal pain and chest pain.  She states that she also became dizzy.  States that the symptoms have persisted until today.  She denies any nausea, vomiting, diarrhea or fevers.  Denies shortness of breath or syncope..  Review of Systems  Positive: See above Negative:   Physical Exam  BP (!) 160/98 (BP Location: Right Arm)   Pulse 72   Temp 98.3 F (36.8 C) (Oral)   Resp 16   SpO2 100%  Gen:   Awake, alert and oriented no distress   Resp:  Normal effort  MSK:   Moves extremities without difficulty  Other:  S1/S2 without murmur.  Lungs are clear bilaterally.    Medical Decision Making  Medically screening exam initiated at 6:26 PM.  Appropriate orders placed.  Rashel Alyson Locket Heather [name] was informed that the remainder of the evaluation will be completed by another provider, this initial triage assessment does not replace that evaluation, and the importance of remaining in the ED until their evaluation is complete.     Mickie Hillier, PA-C 07/18/22 1826    Mickie Hillier, PA-C 07/18/22 1827

## 2022-07-19 ENCOUNTER — Other Ambulatory Visit: Payer: Self-pay

## 2022-07-19 NOTE — ED Notes (Signed)
Patient family states wait is too long and they are leaving

## 2022-07-27 ENCOUNTER — Telehealth: Payer: Self-pay | Admitting: Emergency Medicine

## 2022-07-27 NOTE — Telephone Encounter (Signed)
Copied from [place] [phone]. Topic: General - Other >> Jul 14, 2022  8:49 AM Chapman Fitch wrote: Reason for CRM: Pts daughter called to see if MRI was scheduled for pt or if she needs to call to schedule / please advise on MRI/ referral asap >> Jul 25, 2022 11:17 AM Oley Balm E wrote: Pt called back regarding her MRI, says she is still waiting to be scheduled

## 2022-07-28 ENCOUNTER — Telehealth: Payer: Self-pay | Admitting: Emergency Medicine

## 2022-07-28 DIAGNOSIS — Z8673 Personal history of transient ischemic attack (TIA), and cerebral infarction without residual deficits: Secondary | ICD-10-CM

## 2022-07-28 DIAGNOSIS — I669 Occlusion and stenosis of unspecified cerebral artery: Secondary | ICD-10-CM

## 2022-07-28 NOTE — Addendum Note (Signed)
Addended by: Karle Plumber B on: 07/28/2022 06:06 PM   Modules accepted: Orders

## 2022-07-28 NOTE — Telephone Encounter (Signed)
Copied from [place] [id number]. Topic: General - Other >> Jul 28, 2022 12:27 PM Heather [name] wrote: Reason for CRM: The patient has called to request orders for an MRI   Please contact the patient further when the orders are created

## 2022-08-05 NOTE — Telephone Encounter (Signed)
Called and scheduled appt, patient is also aware   Interpreter [phone]

## 2022-08-09 ENCOUNTER — Other Ambulatory Visit: Payer: Self-pay

## 2022-08-15 ENCOUNTER — Other Ambulatory Visit: Payer: Self-pay

## 2022-08-17 ENCOUNTER — Other Ambulatory Visit: Payer: Self-pay

## 2022-08-17 ENCOUNTER — Ambulatory Visit (HOSPITAL_COMMUNITY)
Admission: RE | Admit: 2022-08-17 | Discharge: 2022-08-17 | Disposition: A | Discharge status: Home / Self Care | Payer: Self-pay | Source: Ambulatory Visit | Attending: Internal Medicine | Admitting: Internal Medicine

## 2022-08-17 DIAGNOSIS — Z8673 Personal history of transient ischemic attack (TIA), and cerebral infarction without residual deficits: Secondary | ICD-10-CM | POA: Insufficient documentation

## 2022-08-17 DIAGNOSIS — I669 Occlusion and stenosis of unspecified cerebral artery: Secondary | ICD-10-CM | POA: Insufficient documentation

## 2022-08-23 ENCOUNTER — Other Ambulatory Visit: Payer: Self-pay

## 2022-09-02 ENCOUNTER — Ambulatory Visit: Payer: Self-pay | Attending: Internal Medicine | Admitting: Internal Medicine

## 2022-09-02 ENCOUNTER — Ambulatory Visit (HOSPITAL_BASED_OUTPATIENT_CLINIC_OR_DEPARTMENT_OTHER): Payer: No Typology Code available for payment source | Admitting: Internal Medicine

## 2022-09-02 ENCOUNTER — Other Ambulatory Visit: Payer: Self-pay

## 2022-09-02 DIAGNOSIS — Z760 Encounter for issue of repeat prescription: Secondary | ICD-10-CM

## 2022-09-02 DIAGNOSIS — J22 Unspecified acute lower respiratory infection: Secondary | ICD-10-CM

## 2022-09-02 MED ORDER — GLIMEPIRIDE 1 MG PO TABS
1.0000 mg | ORAL_TABLET | Freq: Every day | ORAL | 0 refills | Status: DC
  Filled 2022-09-02 – 2022-11-07 (×3): qty 90, 90d supply, fill #0
  Filled ????-??-??: fill #0

## 2022-09-02 MED ORDER — TICAGRELOR 90 MG PO TABS
90.0000 mg | ORAL_TABLET | Freq: Two times a day (BID) | ORAL | 3 refills | Status: DC
  Filled 2022-09-02 – 2023-01-12 (×3): qty 60, 30d supply, fill #0
  Filled 2023-01-20 – 2023-07-27 (×8): qty 60, 30d supply, fill #1
  Filled ????-??-??: fill #0

## 2022-09-02 MED ORDER — TRAZODONE HCL 50 MG PO TABS
25.0000 mg | ORAL_TABLET | Freq: Every evening | ORAL | 3 refills | Status: DC
  Filled 2022-09-02: qty 30, 60d supply, fill #0
  Filled 2022-10-07: qty 30, 60d supply, fill #1
  Filled 2022-11-07: qty 45, 90d supply, fill #1
  Filled 2023-01-12 – 2023-02-14 (×2): qty 45, 90d supply, fill #2
  Filled ????-??-??: fill #1

## 2022-09-02 MED ORDER — LEVETIRACETAM 500 MG PO TABS
500.0000 mg | ORAL_TABLET | Freq: Two times a day (BID) | ORAL | 4 refills | Status: DC
  Filled 2022-09-02: qty 180, 90d supply, fill #0
  Filled 2022-10-07 – 2022-11-07 (×2): qty 180, 90d supply, fill #1
  Filled 2023-01-12 – 2023-02-14 (×3): qty 180, 90d supply, fill #2
  Filled 2023-04-25 – 2023-06-27 (×3): qty 180, 90d supply, fill #3
  Filled ????-??-??: fill #1

## 2022-09-02 MED ORDER — PRAVASTATIN SODIUM 10 MG PO TABS
10.0000 mg | ORAL_TABLET | ORAL | 0 refills | Status: DC
  Filled 2022-09-02: qty 12, 84d supply, fill #0

## 2022-09-02 NOTE — Progress Notes (Signed)
Patient ID: Heather [name], female   DOB: [birth year], 66 y.o.   MRN: [id number] Virtual Visit via Telephone Note  I connected with [name] on 09/02/2022 at 5:04 PM by telephone and verified that I am speaking with the correct person using two identifiers This was to be an in-person appt but pt had called and requested it to be changed to telephone Location: Patient: home Provider: office  Participants: Myself Patient [place] interpreter: Monica Becton [phone]   I discussed the limitations, risks, security and privacy concerns of performing an evaluation and management service by telephone and the availability of in person appointments. I also discussed with the patient that there may be a patient responsible charge related to this service. The patient expressed understanding and agreed to proceed.   History of Present Illness: Hx of DM, HTN, HL,former smoker tob dep, CVA RT MCA territory, s/p thrombectomy followed by stenting of M2 by interventional radiology, sz disorder, depression   C/o having "a little flu" x 3 days Symptoms include a lot of cough productive of brown phlegm, subjective fever. Little sore throat; some congestion when it first start.  No SOB, loss taste, smell. Her 26 yr old child is sick.  She already took her to the doctor and states she was diagnosed with an ear infection and given antibiotics. She has not done a home COVID test   Request RF on meds will be going to [place] for 2 mths  Outpatient Encounter Medications as of 09/02/2022  Medication Sig   acetaminophen (TYLENOL) 500 MG tablet Take 1,000 mg by mouth every 8 (eight) hours as needed for moderate pain or headache.   Blood Glucose Monitoring Suppl (TRUE METRIX METER) w/Device KIT 1 kit by Does not apply route 4 (four) times daily.   Cholecalciferol (VITAMIN D3 PO) Take 1 tablet by mouth daily.   diclofenac Sodium (VOLTAREN) 1 % GEL Apply 2 g topically 2 (two) times daily as needed  (apply to knees).   escitalopram (LEXAPRO) 10 MG tablet TAKE 1 TABLET (10 MG TOTAL) BY MOUTH DAILY.   glimepiride (AMARYL) 1 MG tablet TAKE 1 TABLET BY MOUTH DAILY WITH BREAKFAST. (Must keep upcoming office visit for refills)   glucose blood (TRUE METRIX BLOOD GLUCOSE TEST) test strip Use as instructed   levETIRAcetam (KEPPRA) 500 MG tablet Take 1 tablet (500 mg total) by mouth 2 (two) times daily.   meclizine (ANTIVERT) 12.5 MG tablet Take 1 tablet (12.5 mg total) by mouth 2 (two) times daily as needed for dizziness.   methylPREDNISolone (MEDROL DOSEPAK) 4 MG TBPK tablet Take as directed   pravastatin (PRAVACHOL) 10 MG tablet Take 1 tablet by mouth on Monday, Wednesday, and Friday.   ticagrelor (BRILINTA) 90 MG TABS tablet TAKE 1 TABLET (90 MG TOTAL) BY MOUTH 2 (TWO) TIMES DAILY.   traMADol (ULTRAM) 50 MG tablet Take by mouth every 6 (six) hours as needed.   traZODone (DESYREL) 50 MG tablet Take 1/2  tablet by mouth nightly   triamcinolone cream (KENALOG) 0.1 % Apply cream to affected area(s) (two) times daily. Apply to rash on lower abdomen   TRUEplus Lancets 28G MISC Use 4 times daily   No facility-administered encounter medications on file as of 09/02/2022.      Observations/Objective: No direct observation done as this was a telephone visit.  Patient did not sound acutely short of breath or congested.  She was able to talk in complete sentences.  Assessment and Plan: 1. Acute respiratory infection  Common cold versus COVID infection.  Advised patient to get a COVID-19 test from the grocery store and do a home test.  She states she will do so.  Call us back if it is positive.  However it sounds as though her symptoms are improving from 3 days ago.  Advised to use Tylenol as needed for any further fever and Robitussin for the cough.  If she develops shortness of breath or fever returns with shortness of breath, she should be seen in the emergency room.  She expressed understanding.  2.  Medicine refill Refills sent on medications including Keppra, Pravachol, glimepiride.   Follow Up Instructions: Follow-up with [place] in 6 weeks for chronic disease management.  Message sent to the administrative pool for scheduling.   I discussed the assessment and treatment plan with the patient. The patient was provided an opportunity to ask questions and all were answered. The patient agreed with the plan and demonstrated an understanding of the instructions.   The patient was advised to call back or seek an in-person evaluation if the symptoms worsen or if the condition fails to improve as anticipated.  I  Spent 16 minutes on this telephone encounter  This note has been created with Surveyor, quantity. Any transcriptional errors are unintentional.  Karle Plumber, MD

## 2022-09-05 ENCOUNTER — Other Ambulatory Visit: Payer: Self-pay

## 2022-10-07 ENCOUNTER — Other Ambulatory Visit: Payer: Self-pay | Admitting: Internal Medicine

## 2022-10-07 DIAGNOSIS — E1169 Type 2 diabetes mellitus with other specified complication: Secondary | ICD-10-CM

## 2022-10-10 ENCOUNTER — Other Ambulatory Visit: Payer: Self-pay

## 2022-10-10 MED ORDER — TRUE METRIX METER W/DEVICE KIT
1.0000 | PACK | Freq: Four times a day (QID) | 0 refills | Status: DC
  Filled 2022-10-10: qty 1, 30d supply, fill #0

## 2022-10-10 NOTE — Telephone Encounter (Signed)
Requested Prescriptions  Pending Prescriptions Disp Refills   Blood Glucose Monitoring Suppl (TRUE METRIX METER) w/Device KIT 1 kit 0    Sig: 1 kit by Does not apply route 4 (four) times daily.     Endocrinology: Diabetes - Testing Supplies Passed - 10/07/2022  7:30 PM      Passed - Valid encounter within last 12 months    Recent Outpatient Visits           1 month ago Acute respiratory infection   [place], MD   5 months ago Diabetes mellitus type 2 in obese [facility])   [place], MD   9 months ago Plantar fasciitis   [place], [place]   10 months ago Diabetes mellitus type 2 in obese [facility])   [place], MD   11 months ago Diabetes mellitus type 2 in obese [facility])   [place], Dionne Bucy, [place]       Future Appointments             In 2 weeks Hilty, Nadean Corwin, MD [facility], [facility]

## 2022-10-12 ENCOUNTER — Other Ambulatory Visit: Payer: Self-pay

## 2022-10-26 ENCOUNTER — Other Ambulatory Visit: Payer: Self-pay

## 2022-10-26 ENCOUNTER — Encounter (HOSPITAL_BASED_OUTPATIENT_CLINIC_OR_DEPARTMENT_OTHER): Payer: Self-pay | Admitting: Internal Medicine

## 2022-10-26 ENCOUNTER — Ambulatory Visit (INDEPENDENT_AMBULATORY_CARE_PROVIDER_SITE_OTHER): Payer: Self-pay | Admitting: Internal Medicine

## 2022-10-26 VITALS — BP 100/70 | HR 78 | Ht 67.0 in | Wt 250.0 lb

## 2022-10-26 DIAGNOSIS — I10 Essential (primary) hypertension: Secondary | ICD-10-CM

## 2022-10-26 DIAGNOSIS — T466X5A Adverse effect of antihyperlipidemic and antiarteriosclerotic drugs, initial encounter: Secondary | ICD-10-CM

## 2022-10-26 DIAGNOSIS — E785 Hyperlipidemia, unspecified: Secondary | ICD-10-CM

## 2022-10-26 DIAGNOSIS — Z8673 Personal history of transient ischemic attack (TIA), and cerebral infarction without residual deficits: Secondary | ICD-10-CM

## 2022-10-26 DIAGNOSIS — E119 Type 2 diabetes mellitus without complications: Secondary | ICD-10-CM

## 2022-10-26 DIAGNOSIS — M791 Myalgia, unspecified site: Secondary | ICD-10-CM

## 2022-10-26 MED ORDER — EZETIMIBE 10 MG PO TABS
10.0000 mg | ORAL_TABLET | Freq: Every day | ORAL | 3 refills | Status: DC
  Filled 2022-10-26: qty 90, 90d supply, fill #0
  Filled 2023-01-12 – 2023-01-20 (×2): qty 90, 90d supply, fill #1
  Filled 2023-02-14 – 2023-04-25 (×5): qty 90, 90d supply, fill #2
  Filled 2023-07-27 – 2023-07-28 (×2): qty 90, 90d supply, fill #3
  Filled ????-??-??: fill #1

## 2022-10-26 MED ORDER — PRAVASTATIN SODIUM 10 MG PO TABS
10.0000 mg | ORAL_TABLET | Freq: Every day | ORAL | 3 refills | Status: DC
  Filled 2022-10-26 – 2022-11-07 (×2): qty 90, 90d supply, fill #0
  Filled 2023-01-12 – 2023-02-14 (×3): qty 90, 90d supply, fill #1
  Filled 2023-04-25 – 2023-05-11 (×4): qty 90, 90d supply, fill #2
  Filled 2023-09-05 – 2023-09-06 (×2): qty 90, 90d supply, fill #3
  Filled ????-??-??: fill #0

## 2022-10-26 NOTE — Progress Notes (Signed)
LIPID CLINIC CONSULT NOTE  Chief Complaint:  Manage dyslipidemia  Primary Care Physician: Ladell Pier, MD  Primary Cardiologist:  None  HPI:  Heather [name] is a 67 y.o. female who is being seen today for the evaluation of dyslipidemia at the request of Ladell Pier, MD. This is a pleasant 67 yo spanish-speaking female seen today with a Psychologist, sport and exercise. She has had an unfortunate history of stroke.  She has been on statin therapy in the past, primarily on atorvastatin and then subsequently on rosuvastatin due to side effects which were upper arm myalgias.  At 1 point her LDL was down to 36, however as she could not tolerate the rosuvastatin she was then switched to pravastatin 10 mg daily.  She was only been taking the pravastatin every other day but her LDL cholesterol was still at 91.  Her target LDL is less than 70.  She was advised to increase the pravastatin to daily which she did but still gets some low-grade muscle pain with that.  She does say that it is fairly tolerable.  She has tried to lower cholesterol with changes in diet and activity.  She is working on weight loss which is also a factor.  Unfortunately she had a smoking history but quit about 4 years ago.  There is heart disease in the family primarily in her father.  Other risk factors include type 2 diabetes and hypertension.  PMHx:  Past Medical History:  Diagnosis Date   Depression    Diabetes ([place])    Hypertension    Seizure ([place])    Stroke [facility])     Past Surgical History:  Procedure Laterality Date   BREAST EXCISIONAL BIOPSY     BREAST SURGERY     right breast   CHOLECYSTECTOMY     IR ANGIO INTRA EXTRACRAN SEL COM CAROTID INNOMINATE BILAT MOD SED  03/06/2018   IR ANGIO INTRA EXTRACRAN SEL COM CAROTID INNOMINATE BILAT MOD SED  11/20/2018   IR ANGIO INTRA EXTRACRAN SEL COM CAROTID INNOMINATE BILAT MOD SED  10/31/2019   IR ANGIO VERTEBRAL SEL VERTEBRAL BILAT MOD SED  03/06/2018    IR ANGIO VERTEBRAL SEL VERTEBRAL BILAT MOD SED  11/20/2018   IR ANGIO VERTEBRAL SEL VERTEBRAL BILAT MOD SED  10/31/2019   IR CT HEAD LTD  12/18/2017   IR INTRA CRAN STENT  12/18/2017   IR PERCUTANEOUS ART THROMBECTOMY/INFUSION INTRACRANIAL INC DIAG ANGIO  12/18/2017   IR RADIOLOGIST EVAL & MGMT  01/31/2018   IR US GUIDE VASC ACCESS RIGHT  10/31/2019   RADIOLOGY WITH ANESTHESIA N/A 12/18/2017   Procedure: RADIOLOGY WITH ANESTHESIA;  Surgeon: Luanne Bras, MD;  Location: [place];  Service: Radiology;  Laterality: N/A;    FAMHx:  Family History  Problem Relation Age of Onset   Diabetes Mother    Pulmonary disease Mother    Heart disease Father    Stroke Sister     SOCHx:   reports that she quit smoking about 4 years ago. Her smoking use included cigarettes. She has a 15.00 pack-year smoking history. She has never used smokeless tobacco. She reports that she does not currently use alcohol. She reports that she does not use drugs.  ALLERGIES:  No Known Allergies  ROS: Pertinent items noted in HPI and remainder of comprehensive ROS otherwise negative.  HOME MEDS: Current Outpatient Medications on File Prior to Visit  Medication Sig Dispense Refill   acetaminophen (TYLENOL) 500 MG tablet Take 1,000  mg by mouth every 8 (eight) hours as needed for moderate pain or headache.     Blood Glucose Monitoring Suppl (TRUE METRIX METER) w/Device KIT Use 4 (four) times daily to check blood sugar 1 kit 0   Cholecalciferol (VITAMIN D3 PO) Take 1 tablet by mouth daily.     diclofenac Sodium (VOLTAREN) 1 % GEL Apply 2 g topically 2 (two) times daily as needed (apply to knees). 100 g 1   escitalopram (LEXAPRO) 10 MG tablet TAKE 1 TABLET (10 MG TOTAL) BY MOUTH DAILY. (Patient taking differently: Take 10 mg by mouth daily as needed.) 30 tablet 2   glimepiride (AMARYL) 1 MG tablet Take 1 tablet (1 mg total) by mouth daily with breakfast. 90 tablet 0   glucose blood (TRUE METRIX BLOOD GLUCOSE TEST) test strip  Use as instructed 100 each 2   levETIRAcetam (KEPPRA) 500 MG tablet Take 1 tablet (500 mg total) by mouth 2 (two) times daily. (Patient taking differently: Take 500 mg by mouth as needed.) 180 tablet 4   meclizine (ANTIVERT) 12.5 MG tablet Take 1 tablet (12.5 mg total) by mouth 2 (two) times daily as needed for dizziness. 30 tablet 3   ticagrelor (BRILINTA) 90 MG TABS tablet Take 1 tablet (90 mg total) by mouth 2 (two) times daily. 60 tablet 3   traMADol (ULTRAM) 50 MG tablet Take by mouth every 6 (six) hours as needed.     traZODone (DESYREL) 50 MG tablet Take 0.5 tablets (25 mg total) by mouth Nightly. (Patient taking differently: Take 25 mg by mouth as needed.) 30 tablet 3   triamcinolone cream (KENALOG) 0.1 % Apply cream to affected area(s) (two) times daily. Apply to rash on lower abdomen (Patient taking differently: Apply 1 application  topically 2 (two) times daily as needed.) 30 g 0   TRUEplus Lancets 28G MISC Use 4 times daily 100 each 3   No current facility-administered medications on file prior to visit.    LABS/IMAGING: No results found for this or any previous visit (from the past 48 hour(s)). No results found.  LIPID PANEL:    Component Value Date/Time   CHOL 164 05/03/2022 1618   TRIG 67 05/03/2022 1618   HDL 60 05/03/2022 1618   CHOLHDL 2.7 05/03/2022 1618   CHOLHDL 2.5 03/24/2019 0611   VLDL 15 03/24/2019 0611   LDLCALC 91 05/03/2022 1618    WEIGHTS: Wt Readings from Last 3 Encounters:  10/26/22 250 lb (113.4 kg)  06/13/22 246 lb (111.6 kg)  05/03/22 247 lb (112 kg)    VITALS: BP 100/70   Pulse 78   Ht 5\' 7"  (1.702 m)   Wt 250 lb (113.4 kg)   BMI 39.16 kg/m   EXAM: Deferred  EKG: Deferred  ASSESSMENT: Mixed dyslipidemia, goal LDL less than 70 History of prior stroke Hypertension Type 2 diabetes Statin intolerance-myalgias  PLAN: 1.   Ms. was intolerant to high intensity statin such as rosuvastatin and atorvastatin but is tolerating  very low-dose pravastatin.  She is requesting a refill for 10 mg daily which she says she is able to take but does not feel that she could tolerate more the medication.  She will need additional therapy to reach her goals and I would recommend starting ezetimibe 10 mg daily.  She will continue with her pravastatin and plan to have repeat lipids in about 3 to 4 months.  She can follow-up with me at that time.  Thanks again for the kind referral.  Pixie Casino, MD, [facility], [place] Director of the Advanced Lipid Disorders &  Cardiovascular Risk Reduction Clinic Diplomate of the American Board of Clinical Lipidology Attending Cardiologist  Direct Dial: [phone]  Fax: [phone]  Website:  [email or web address] Ronika Kelson 10/26/2022, 11:49 AM

## 2022-10-26 NOTE — Patient Instructions (Signed)
Medication Instructions:  START zetia 10mg  daily in addition to pravastatin 10mg  daily   *If you need a refill on your cardiac medications before your next appointment, please call your pharmacy*   Lab Work: FASTING lab work to check cholesterol in about 3-4 months  If you have labs (blood work) drawn today and your tests are completely normal, you will receive your results only by: [place] (if you have MyChart) OR A paper copy in the mail If you have any lab test that is abnormal or we need to change your treatment, we will call you to review the results.   Testing/Procedures: NONE   Follow-Up: At [facility], you and your health needs are our priority.  As part of our continuing mission to provide you with exceptional heart care, we have created designated Provider Care Teams.  These Care Teams include your primary Cardiologist (physician) and Advanced Practice Providers (APPs -  Physician Assistants and Nurse Practitioners) who all work together to provide you with the care you need, when you need it.  We recommend signing up for the patient portal called "MyChart".  Sign up information is provided on this After Visit Summary.  MyChart is used to connect with patients for Virtual Visits (Telemedicine).  Patients are able to view lab/test results, encounter notes, upcoming appointments, etc.  Non-urgent messages can be sent to your provider as well.   To learn more about what you can do with MyChart, go to [email or web address].    Your next appointment:    3-4 months with Dr. Debara Pickett -- after lab work

## 2022-10-27 ENCOUNTER — Other Ambulatory Visit: Payer: Self-pay

## 2022-11-04 ENCOUNTER — Other Ambulatory Visit (HOSPITAL_COMMUNITY): Payer: Self-pay

## 2022-11-04 ENCOUNTER — Other Ambulatory Visit: Payer: Self-pay | Admitting: Internal Medicine

## 2022-11-04 DIAGNOSIS — E1169 Type 2 diabetes mellitus with other specified complication: Secondary | ICD-10-CM

## 2022-11-04 MED ORDER — TRUE METRIX BLOOD GLUCOSE TEST VI STRP
ORAL_STRIP | 2 refills | Status: DC
  Filled 2022-11-08: qty 200, 50d supply, fill #0
  Filled 2023-01-12: qty 100, 25d supply, fill #1
  Filled ????-??-??: fill #0

## 2022-11-04 MED ORDER — ESCITALOPRAM OXALATE 10 MG PO TABS
ORAL_TABLET | Freq: Every day | ORAL | 1 refills | Status: DC
  Filled 2022-11-07: qty 90, 90d supply, fill #0
  Filled 2023-01-12 – 2023-02-14 (×3): qty 90, 90d supply, fill #1
  Filled ????-??-??: fill #0

## 2022-11-04 NOTE — Telephone Encounter (Signed)
Patient will need an office visit for further refills. Requested Prescriptions  Pending Prescriptions Disp Refills   glucose blood (TRUE METRIX BLOOD GLUCOSE TEST) test strip 100 each 2    Sig: Use as instructed     Endocrinology: Diabetes - Testing Supplies Passed - 11/04/2022 10:18 AM      Passed - Valid encounter within last 12 months    Recent Outpatient Visits           2 months ago Acute respiratory infection   [place], MD   6 months ago Diabetes mellitus type 2 in obese [facility])   [place] Ladell Pier, MD   10 months ago Plantar fasciitis   [place], Levada Dy M, [place]   11 months ago Diabetes mellitus type 2 in obese [facility])   [place] Karle Plumber B, MD   1 year ago Diabetes mellitus type 2 in obese [facility])   [place], [place], [place]       Future Appointments             In 5 months [place], Nadean Corwin, MD [facility], [facility]             escitalopram (LEXAPRO) 10 MG tablet 90 tablet 1    Sig: TAKE 1 TABLET (10 MG TOTAL) BY MOUTH DAILY.     Psychiatry:  Antidepressants - SSRI Failed - 11/04/2022 10:18 AM      Failed - Valid encounter within last 6 months    Recent Outpatient Visits           2 months ago Acute respiratory infection   [place], MD   6 months ago Diabetes mellitus type 2 in obese [facility])   [place] Ladell Pier, MD   10 months ago Plantar fasciitis   [place], Levada Dy [place], [place]   11 months ago Diabetes mellitus type 2 in obese [facility])   [place] Ladell Pier, MD   1 year ago Diabetes mellitus type 2 in obese  [facility])   [place] [address], Dionne Bucy, [place]       Future Appointments             In 5 months Hilty, Nadean Corwin, MD [facility], [place] - Completed PHQ-2 or PHQ-9 in the last 360 days

## 2022-11-07 ENCOUNTER — Other Ambulatory Visit: Payer: Self-pay

## 2022-11-08 ENCOUNTER — Other Ambulatory Visit: Payer: Self-pay

## 2022-12-21 ENCOUNTER — Ambulatory Visit: Payer: Self-pay | Attending: Physician Assistant | Admitting: Physician Assistant

## 2022-12-21 ENCOUNTER — Other Ambulatory Visit: Payer: Self-pay

## 2022-12-21 ENCOUNTER — Encounter: Payer: Self-pay | Admitting: Physician Assistant

## 2022-12-21 VITALS — BP 117/83 | HR 88 | Wt 253.0 lb

## 2022-12-21 DIAGNOSIS — Z23 Encounter for immunization: Secondary | ICD-10-CM

## 2022-12-21 DIAGNOSIS — R42 Dizziness and giddiness: Secondary | ICD-10-CM

## 2022-12-21 DIAGNOSIS — L309 Dermatitis, unspecified: Secondary | ICD-10-CM

## 2022-12-21 DIAGNOSIS — E1169 Type 2 diabetes mellitus with other specified complication: Secondary | ICD-10-CM

## 2022-12-21 DIAGNOSIS — E782 Mixed hyperlipidemia: Secondary | ICD-10-CM

## 2022-12-21 DIAGNOSIS — L989 Disorder of the skin and subcutaneous tissue, unspecified: Secondary | ICD-10-CM

## 2022-12-21 DIAGNOSIS — E669 Obesity, unspecified: Secondary | ICD-10-CM

## 2022-12-21 LAB — POCT GLYCOSYLATED HEMOGLOBIN (HGB A1C): HbA1c, POC (controlled diabetic range): 5.8 % (ref 0.0–7.0)

## 2022-12-21 LAB — GLUCOSE, POCT (MANUAL RESULT ENTRY): POC Glucose: 143 mg/dl — AB (ref 70–99)

## 2022-12-21 MED ORDER — GLIMEPIRIDE 1 MG PO TABS
1.0000 mg | ORAL_TABLET | Freq: Every day | ORAL | 1 refills | Status: DC
  Filled 2022-12-21 – 2023-01-12 (×2): qty 90, 90d supply, fill #0
  Filled 2023-01-20: qty 30, 30d supply, fill #0
  Filled 2023-02-14: qty 90, 90d supply, fill #0
  Filled 2023-04-25 – 2023-05-11 (×4): qty 90, 90d supply, fill #1

## 2022-12-21 MED ORDER — MECLIZINE HCL 12.5 MG PO TABS
12.5000 mg | ORAL_TABLET | Freq: Two times a day (BID) | ORAL | 3 refills | Status: DC | PRN
  Filled 2022-12-21: qty 100, 50d supply, fill #0
  Filled 2023-01-12 – 2023-04-25 (×4): qty 100, 50d supply, fill #1
  Filled 2023-04-25: qty 70, 35d supply, fill #1
  Filled 2023-06-27: qty 100, 50d supply, fill #2

## 2022-12-21 MED ORDER — TRIAMCINOLONE ACETONIDE 0.1 % EX CREA
1.0000 | TOPICAL_CREAM | Freq: Two times a day (BID) | CUTANEOUS | 2 refills | Status: DC | PRN
  Filled 2022-12-21: qty 45, 23d supply, fill #0
  Filled 2023-01-12: qty 45, 23d supply, fill #1
  Filled 2023-01-20 – 2023-02-14 (×2): qty 45, 23d supply, fill #2

## 2022-12-21 NOTE — Progress Notes (Signed)
Patient ID: Heather [name], female   DOB: 27-Mar-1956, 67 y.o.   MRN: [id number]   Heather [name], is a 67 y.o. female  [id number]  [id number]  DOB - April 08, 1956  Chief Complaint  Patient presents with   Diabetes       Subjective:   Heather [name] is a 67 y.o. female here today for med RF and check up.  Doing well.  She also patches of itchiness on her face and a new skin lesion on her R upper chest for about 2 weeks.    No problems updated.  ALLERGIES: No Known Allergies  PAST MEDICAL HISTORY: Past Medical History:  Diagnosis Date   Depression    Diabetes ([place])    Hypertension    Seizure ([place])    Stroke [facility])     MEDICATIONS AT HOME: Prior to Admission medications   Medication Sig Start Date End Date Taking? Authorizing Provider  acetaminophen (TYLENOL) 500 MG tablet Take 1,000 mg by mouth every 8 (eight) hours as needed for moderate pain or headache.   Yes [provider]  Blood Glucose Monitoring Suppl (TRUE METRIX METER) w/Device KIT Use 4 (four) times daily to check blood sugar 10/10/22  Yes Ladell Pier, MD  Cholecalciferol (VITAMIN D3 PO) Take 1 tablet by mouth daily.   Yes [provider]  diclofenac Sodium (VOLTAREN) 1 % GEL Apply 2 g topically 2 (two) times daily as needed (apply to knees). 11/22/21  Yes Ladell Pier, MD  escitalopram (LEXAPRO) 10 MG tablet TAKE 1 TABLET (10 MG TOTAL) BY MOUTH DAILY. 11/04/22  Yes Ladell Pier, MD  ezetimibe (ZETIA) 10 MG tablet Take 1 tablet (10 mg total) by mouth daily. 10/26/22 10/21/23 Yes Hilty, Nadean Corwin, MD  glucose blood (TRUE METRIX BLOOD GLUCOSE TEST) test strip Use as instructed 11/04/22  Yes Ladell Pier, MD  levETIRAcetam (KEPPRA) 500 MG tablet Take 1 tablet (500 mg total) by mouth 2 (two) times daily. Patient taking differently: Take 500 mg by mouth as needed. 09/02/22  Yes Ladell Pier, MD  pravastatin (PRAVACHOL) 10 MG tablet Take 1 tablet (10 mg  total) by mouth daily. 10/26/22  Yes Hilty, Nadean Corwin, MD  ticagrelor (BRILINTA) 90 MG TABS tablet Take 1 tablet (90 mg total) by mouth 2 (two) times daily. 09/02/22  Yes Ladell Pier, MD  traMADol (ULTRAM) 50 MG tablet Take by mouth every 6 (six) hours as needed.   Yes [provider]  traZODone (DESYREL) 50 MG tablet Take 0.5 tablets (25 mg total) by mouth Nightly. Patient taking differently: Take 25 mg by mouth as needed. 09/02/22  Yes Ladell Pier, MD  TRUEplus Lancets 28G MISC Use 4 times daily 06/14/22  Yes Ladell Pier, MD  glimepiride (AMARYL) 1 MG tablet Take 1 tablet (1 mg total) by mouth daily with breakfast. 12/21/22   Argentina Donovan, PA-C  meclizine (ANTIVERT) 12.5 MG tablet Take 1 tablet (12.5 mg total) by mouth 2 (two) times daily as needed for dizziness. 12/21/22   Argentina Donovan, PA-C  triamcinolone cream (KENALOG) 0.1 % Apply 1 Application topically 2 (two) times daily as needed. 12/21/22   Margeaux Swantek, [place], PA-C    ROS: Neg HEENT Neg resp Neg cardiac Neg GI Neg GU Neg MS Neg psych Neg neuro  Objective:   Vitals:   12/21/22 0917  BP: 117/83  Pulse: 88  SpO2: 97%  Weight: 253 lb (114.8 kg)   Exam General  appearance : Awake, alert, not in any distress. Speech Clear. Not toxic looking HEENT: Atraumatic and Normocephalic, pupils equally reactive to light and accomodation Neck: Supple, no JVD. No cervical lymphadenopathy.  Chest: Good air entry bilaterally, CTAB.  No rales/rhonchi/wheezing CVS: S1 S2 regular, no murmurs.  Abdomen: Bowel sounds present, Non tender and not distended with no gaurding, rigidity or rebound. Extremities: B/L Lower Ext shows no edema, both legs are warm to touch Neurology: Awake alert, and oriented X 3, CN II-XII intact, Non focal Skin: a couple patches of dry skin on face; very superficial.  Lesion on chest is about 1 cm hyperpigmented and scaly.    Data Review Lab Results  Component Value Date   HGBA1C  5.8 12/21/2022   HGBA1C 5.6 05/03/2022   HGBA1C 5.5 11/22/2021    Assessment & Plan   1. Diabetes mellitus type 2 in obese (HCC) controlled - Glucose (CBG) - HgB A1c - Comprehensive metabolic panel - glimepiride (AMARYL) 1 MG tablet; Take 1 tablet (1 mg total) by mouth daily with breakfast.  Dispense: 90 tablet; Refill: 1  2. Mixed hyperlipidemia Followed by cardiology but patient insisting to check today - Lipid panel  3. Dizziness Not new/stable - meclizine (ANTIVERT) 12.5 MG tablet; Take 1 tablet (12.5 mg total) by mouth 2 (two) times daily as needed for dizziness.  Dispense: 30 tablet; Refill: 3  4. Skin lesion - Ambulatory referral to Dermatology  5. Dermatitis - triamcinolone cream (KENALOG) 0.1 %; Apply 1 Application topically 2 (two) times daily as needed sparingly on face.  Dispense: 45 g; Refill: 2    Return in about 6 months (around 06/23/2023) for Dr Wynetta Emery for chronic conditions.  The patient was given clear instructions to go to ER or return to medical center if symptoms don't improve, worsen or new problems develop. The patient verbalized understanding. The patient was told to call to get lab results if they haven't heard anything in the next week.      Freeman Caldron, PA-C [facility], [place]   12/21/2022, 9:32 AM

## 2022-12-22 LAB — COMPREHENSIVE METABOLIC PANEL
ALT: 14 IU/L (ref 0–32)
AST: 23 IU/L (ref 0–40)
Albumin/Globulin Ratio: 1.4 (ref 1.2–2.2)
Albumin: 4.1 g/dL (ref 3.9–4.9)
Alkaline Phosphatase: 85 IU/L (ref 44–121)
BUN/Creatinine Ratio: 20 (ref 12–28)
BUN: 11 mg/dL (ref 8–27)
Bilirubin Total: 0.5 mg/dL (ref 0.0–1.2)
CO2: 19 mmol/L — ABNORMAL LOW (ref 20–29)
Calcium: 9.4 mg/dL (ref 8.7–10.3)
Chloride: 105 mmol/L (ref 96–106)
Creatinine, Ser: 0.54 mg/dL — ABNORMAL LOW (ref 0.57–1.00)
Globulin, Total: 2.9 g/dL (ref 1.5–4.5)
Glucose: 142 mg/dL — ABNORMAL HIGH (ref 70–99)
Potassium: 5 mmol/L (ref 3.5–5.2)
Sodium: 139 mmol/L (ref 134–144)
Total Protein: 7 g/dL (ref 6.0–8.5)
eGFR: 101 mL/min/{1.73_m2} (ref >59)

## 2022-12-22 LAB — LIPID PANEL
Chol/HDL Ratio: 3.4 ratio (ref 0.0–4.4)
Cholesterol, Total: 175 mg/dL (ref 100–199)
HDL: 52 mg/dL (ref >39)
LDL Chol Calc (NIH): 104 mg/dL — ABNORMAL HIGH (ref 0–99)
Triglycerides: 105 mg/dL (ref 0–149)
VLDL Cholesterol Cal: 19 mg/dL (ref 5–40)

## 2022-12-23 ENCOUNTER — Other Ambulatory Visit: Payer: Self-pay

## 2022-12-26 ENCOUNTER — Telehealth: Payer: Self-pay

## 2022-12-26 NOTE — Telephone Encounter (Signed)
-----   Message from Argentina Donovan, [place] sent at 12/22/2022  5:15 PM EDT ----- Increase water intake to help kidney function and better blood sugar will help too. Liver function and electrolytes normal.  Cholesterol is good.  Thanks, Freeman Caldron, PA-C

## 2022-12-26 NOTE — Telephone Encounter (Signed)
Pt was called and is aware of results, DOB was confirmed.  Interpreter [phone]

## 2023-01-02 ENCOUNTER — Other Ambulatory Visit: Payer: Self-pay

## 2023-01-13 ENCOUNTER — Other Ambulatory Visit: Payer: Self-pay

## 2023-01-19 ENCOUNTER — Other Ambulatory Visit: Payer: Self-pay

## 2023-01-20 ENCOUNTER — Other Ambulatory Visit: Payer: Self-pay

## 2023-01-23 ENCOUNTER — Other Ambulatory Visit: Payer: Self-pay

## 2023-01-24 ENCOUNTER — Other Ambulatory Visit: Payer: Self-pay

## 2023-02-01 ENCOUNTER — Other Ambulatory Visit (HOSPITAL_COMMUNITY): Payer: Self-pay

## 2023-02-07 ENCOUNTER — Other Ambulatory Visit: Payer: Self-pay | Admitting: Internal Medicine

## 2023-02-07 DIAGNOSIS — Z1231 Encounter for screening mammogram for malignant neoplasm of breast: Secondary | ICD-10-CM

## 2023-02-14 ENCOUNTER — Ambulatory Visit
Admission: RE | Admit: 2023-02-14 | Discharge: 2023-02-14 | Disposition: A | Discharge status: Home / Self Care | Payer: Self-pay | Source: Ambulatory Visit | Attending: Internal Medicine | Admitting: Internal Medicine

## 2023-02-14 DIAGNOSIS — Z1231 Encounter for screening mammogram for malignant neoplasm of breast: Secondary | ICD-10-CM | POA: Insufficient documentation

## 2023-02-15 ENCOUNTER — Other Ambulatory Visit: Payer: Self-pay

## 2023-02-16 ENCOUNTER — Other Ambulatory Visit: Payer: Self-pay

## 2023-02-21 ENCOUNTER — Telehealth: Payer: Self-pay | Admitting: Internal Medicine

## 2023-02-21 ENCOUNTER — Ambulatory Visit (HOSPITAL_BASED_OUTPATIENT_CLINIC_OR_DEPARTMENT_OTHER): Payer: Self-pay | Admitting: Internal Medicine

## 2023-02-21 DIAGNOSIS — Z91199 Patient's noncompliance with other medical treatment and regimen due to unspecified reason: Secondary | ICD-10-CM

## 2023-02-21 NOTE — Progress Notes (Unsigned)
Patient ID: Heather [name], female   DOB: 1956/01/22, 67 y.o.   MRN: [id number] Virtual Visit via Telephone Note Patient was called at 8:17 AM.  She no showed this appointment.  Left voicemail message for her to call us back to reschedule. [employer] interpreter was used ID number [id number] Lonn [place].

## 2023-02-21 NOTE — Telephone Encounter (Signed)
Phone call placed to patient today at 7:30 AM using [employer] interpreters Albin Felling [phone]).  Left voicemail message letting patient know that her visit this morning will be telehealth via telephone, not in-person.  Advised that I will call her at the time of her appointment.

## 2023-02-22 ENCOUNTER — Other Ambulatory Visit: Payer: Self-pay

## 2023-02-23 ENCOUNTER — Other Ambulatory Visit: Payer: Self-pay

## 2023-03-13 ENCOUNTER — Other Ambulatory Visit: Payer: Self-pay

## 2023-03-17 ENCOUNTER — Other Ambulatory Visit: Payer: Self-pay

## 2023-03-31 ENCOUNTER — Ambulatory Visit: Payer: Self-pay

## 2023-03-31 NOTE — Telephone Encounter (Signed)
Using [employer] [phone].   Chief Complaint: Bilateral knee swelling  Symptoms: swelling around the knee and the back of the knees  Frequency: About 1 week constantly  Pertinent Negatives: Patient denies pain and other symptoms  Disposition: [] ED /[x] Urgent Care (no appt availability in office) / [] Appointment(In office/virtual)/ []  [place] [facility]/ [] Home Care/ [] Refused Recommended Disposition /[] [facility]/ []  Follow-up with PCP Additional Notes: Patient called stated that she has swelling around the knee and it feels like it has fluid in the back of the knee. Patient reports onset 1 week ago and is constant. Denies any pain or other symptoms at this time. She also reports some swelling in the legs as well. Advised patient to be evaluated, patient was agreeable to go to urgent care, no appointments available in PCP office.  Summary: swelling in knees and legs   Pt stated she has noticed swelling in her knees and legs like water and that she feels very tired. Stated she is scared and doesn't know what to do.  Seeking clinical advice.     Reason for Disposition  Fluid-filled sack just below knee cap (i.e., inferior aspect of the anterior knee)  Answer Assessment - Initial Assessment Questions 1. LOCATION: "Where is the swelling located?"  (e.g., left, right, both knees)     Bilateral knees  2. ONSET: "When did the swelling start?" "Does it come and go, or is it there all the time?"     About a week ago, constant 3. SWELLING: "How bad is the swelling?" Or, "How large is it?" (e.g., mild, moderate, severe; size of localized swelling)    - NONE: No joint swelling.   - LOCALIZED: Localized; small area of puffy or swollen skin (e.g., insect bite, skin irritation).   - MILD: Joint looks or feels mildly swollen or puffy.   - MODERATE: Swollen; interferes with normal activities (e.g., work or school); can't move joint normally (bend and straighten  completely); may be limping.   - SEVERE: Very swollen; can't move swollen joint at all; limping a lot or unable to walk.     Mild 4. PAIN: "Is there any pain?" If Yes, ask: "How bad is it?" (Scale 1-10; or mild, moderate, severe)   - NONE (0): no pain.   - MILD (1-3): doesn't interfere with normal activities.    - MODERATE (4-7): interferes with normal activities (e.g., work or school) or awakens from sleep, limping.    - SEVERE (8-10): excruciating pain, unable to do any normal activities, unable to walk.      Yes, on my right knee mostly at knee when trying to sleep.  5. SETTING: "Has there been any recent work, exercise or other activity that involved that part of the body?"      Not that I can think  6. AGGRAVATING FACTORS: "What makes the knee swelling worse?" (e.g., walking, climbing stairs, running)     No, not that I can think of.  7. ASSOCIATED SYMPTOMS: "Is there any pain or redness?"     No  8. OTHER SYMPTOMS: "Do you have any other symptoms?" (e.g., chest pain, difficulty breathing, fever, calf pain)     It hurts and bothers me more at night.  Protocols used: Knee Swelling-A-AH

## 2023-04-18 ENCOUNTER — Other Ambulatory Visit: Payer: Self-pay

## 2023-04-18 ENCOUNTER — Emergency Department (HOSPITAL_COMMUNITY)
Admission: EM | Admit: 2023-04-18 | Discharge: 2023-04-19 | Disposition: A | Discharge status: Home / Self Care | Payer: Self-pay | Attending: Emergency Medicine | Admitting: Emergency Medicine

## 2023-04-18 ENCOUNTER — Encounter (HOSPITAL_COMMUNITY): Payer: Self-pay

## 2023-04-18 DIAGNOSIS — R600 Localized edema: Secondary | ICD-10-CM

## 2023-04-18 DIAGNOSIS — R1011 Right upper quadrant pain: Secondary | ICD-10-CM | POA: Insufficient documentation

## 2023-04-18 LAB — CBC WITH DIFFERENTIAL/PLATELET
Abs Immature Granulocytes: 0.02 10*3/uL (ref 0.00–0.07)
Basophils Absolute: 0 10*3/uL (ref 0.0–0.1)
Basophils Relative: 1 %
Eosinophils Absolute: 0.1 10*3/uL (ref 0.0–0.5)
Eosinophils Relative: 1 %
HCT: 40.2 % (ref 36.0–46.0)
Hemoglobin: 13.1 g/dL (ref 12.0–15.0)
Immature Granulocytes: 0 %
Lymphocytes Relative: 30 %
Lymphs Abs: 2.6 10*3/uL (ref 0.7–4.0)
MCH: 30.5 pg (ref 26.0–34.0)
MCHC: 32.6 g/dL (ref 30.0–36.0)
MCV: 93.7 fL (ref 80.0–100.0)
Monocytes Absolute: 0.6 10*3/uL (ref 0.1–1.0)
Monocytes Relative: 7 %
Neutro Abs: 5.5 10*3/uL (ref 1.7–7.7)
Neutrophils Relative %: 61 %
Platelets: 286 10*3/uL (ref 150–400)
RBC: 4.29 MIL/uL (ref 3.87–5.11)
RDW: 13.2 % (ref 11.5–15.5)
WBC: 8.8 10*3/uL (ref 4.0–10.5)
nRBC: 0 % (ref 0.0–0.2)

## 2023-04-18 LAB — COMPREHENSIVE METABOLIC PANEL
ALT: 19 U/L (ref 0–44)
AST: 22 U/L (ref 15–41)
Albumin: 3.4 g/dL — ABNORMAL LOW (ref 3.5–5.0)
Alkaline Phosphatase: 62 U/L (ref 38–126)
Anion gap: 10 (ref 5–15)
BUN: 15 mg/dL (ref 8–23)
CO2: 24 mmol/L (ref 22–32)
Calcium: 9.5 mg/dL (ref 8.9–10.3)
Chloride: 103 mmol/L (ref 98–111)
Creatinine, Ser: 0.62 mg/dL (ref 0.44–1.00)
GFR, Estimated: >60 mL/min (ref >60)
Glucose, Bld: 115 mg/dL — ABNORMAL HIGH (ref 70–99)
Potassium: 4.2 mmol/L (ref 3.5–5.1)
Sodium: 137 mmol/L (ref 135–145)
Total Bilirubin: <0.1 mg/dL — ABNORMAL LOW (ref 0.3–1.2)
Total Protein: 7 g/dL (ref 6.5–8.1)

## 2023-04-18 LAB — LIPASE, BLOOD: Lipase: 54 U/L — ABNORMAL HIGH (ref 11–51)

## 2023-04-18 MED ORDER — ONDANSETRON 4 MG PO TBDP
8.0000 mg | ORAL_TABLET | Freq: Once | ORAL | Status: AC
  Administered 2023-04-19: 8 mg via ORAL
  Filled 2023-04-18: qty 2

## 2023-04-18 MED ORDER — ONDANSETRON HCL 4 MG/2ML IJ SOLN
4.0000 mg | Freq: Once | INTRAMUSCULAR | Status: AC
  Administered 2023-04-19: 4 mg via INTRAVENOUS
  Filled 2023-04-18: qty 2

## 2023-04-18 MED ORDER — SODIUM CHLORIDE 0.9 % IV BOLUS
500.0000 mL | Freq: Once | INTRAVENOUS | Status: AC
  Administered 2023-04-19: 500 mL via INTRAVENOUS

## 2023-04-18 NOTE — ED Provider Notes (Signed)
[place] EMERGENCY DEPARTMENT AT [facility] Provider Note   CSN: [id number] Arrival date & time: 04/18/23  1946     History {Add pertinent medical, surgical, social history, OB history to HPI:1} Chief Complaint  Patient presents with   Right Side Pain    Heather [name] is a 67 y.o. female.  Patient presents to the emergency department for evaluation of right-sided pain.  Patient indicates the pain is in the right upper area of her abdomen as well as into her back.  Pain has been present for 1 week but worsened today.  No nausea, vomiting, diarrhea.  No shortness of breath.  She has not had any cough or congestion.  She denies fever.  Patient reports that she does not have a gallbladder.       Home Medications Prior to Admission medications   Medication Sig Start Date End Date Taking? Authorizing Provider  acetaminophen (TYLENOL) 500 MG tablet Take 1,000 mg by mouth every 8 (eight) hours as needed for moderate pain or headache.    [provider]  Blood Glucose Monitoring Suppl (TRUE METRIX METER) w/Device KIT Use 4 (four) times daily to check blood sugar 10/10/22   Marcine Matar, MD  Cholecalciferol (VITAMIN D3 PO) Take 1 tablet by mouth daily.    [provider]  diclofenac Sodium (VOLTAREN) 1 % GEL Apply 2 g topically 2 (two) times daily as needed (apply to knees). 11/22/21   Marcine Matar, MD  escitalopram (LEXAPRO) 10 MG tablet TAKE 1 TABLET (10 MG TOTAL) BY MOUTH DAILY. 11/04/22   Marcine Matar, MD  ezetimibe (ZETIA) 10 MG tablet Take 1 tablet (10 mg total) by mouth daily. 10/26/22 10/21/23  Chrystie Nose, MD  glimepiride (AMARYL) 1 MG tablet Take 1 tablet (1 mg total) by mouth daily with breakfast. 12/21/22   Anders Simmonds, PA-C  glucose blood (TRUE METRIX BLOOD GLUCOSE TEST) test strip Use as instructed 11/04/22   Marcine Matar, MD  levETIRAcetam (KEPPRA) 500 MG tablet Take 1 tablet (500 mg total) by mouth 2 (two)  times daily. Patient taking differently: Take 500 mg by mouth as needed. 09/02/22   Marcine Matar, MD  meclizine (ANTIVERT) 12.5 MG tablet Take 1 tablet (12.5 mg total) by mouth 2 (two) times daily as needed for dizziness. 12/21/22   Anders Simmonds, PA-C  pravastatin (PRAVACHOL) 10 MG tablet Take 1 tablet (10 mg total) by mouth daily. 10/26/22   Hilty, Lisette Abu, MD  ticagrelor (BRILINTA) 90 MG TABS tablet Take 1 tablet (90 mg total) by mouth 2 (two) times daily. 09/02/22   Marcine Matar, MD  traMADol (ULTRAM) 50 MG tablet Take by mouth every 6 (six) hours as needed.    [provider]  traZODone (DESYREL) 50 MG tablet Take 0.5 tablets (25 mg total) by mouth Nightly. Patient taking differently: Take 25 mg by mouth as needed. 09/02/22   Marcine Matar, MD  triamcinolone cream (KENALOG) 0.1 % Apply 1 Application topically 2 (two) times daily as needed. 12/21/22   Anders Simmonds, PA-C  TRUEplus Lancets 28G MISC Use 4 times daily 06/14/22   Marcine Matar, MD      Allergies    Patient has no known allergies.    Review of Systems   Review of Systems  Physical Exam Updated Vital Signs BP (!) 141/92   Pulse 63   Temp 98.7 F (37.1 C) (Oral)   Resp 20  Ht 5\' 7"  (1.702 m)   Wt 108.9 kg   SpO2 99%   BMI 37.59 kg/m  Physical Exam Vitals and nursing note reviewed.  Constitutional:      General: She is not in acute distress.    Appearance: She is well-developed.  HENT:     Head: Normocephalic and atraumatic.     Mouth/Throat:     Mouth: Mucous membranes are moist.  Eyes:     General: Vision grossly intact. Gaze aligned appropriately.     Extraocular Movements: Extraocular movements intact.     Conjunctiva/sclera: Conjunctivae normal.  Cardiovascular:     Rate and Rhythm: Normal rate and regular rhythm.     Pulses: Normal pulses.     Heart sounds: Normal heart sounds, S1 normal and S2 normal. No murmur heard.    No friction rub. No gallop.  Pulmonary:      Effort: Pulmonary effort is normal. No respiratory distress.     Breath sounds: Normal breath sounds.  Chest:    Abdominal:     General: Bowel sounds are normal.     Palpations: Abdomen is soft.     Tenderness: There is abdominal tenderness in the right upper quadrant. There is no guarding or rebound.     Hernia: No hernia is present.  Musculoskeletal:        General: No swelling.     Cervical back: Full passive range of motion without pain, normal range of motion and neck supple. No spinous process tenderness or muscular tenderness. Normal range of motion.       Back:     Right lower leg: No edema.     Left lower leg: No edema.  Skin:    General: Skin is warm and dry.     Capillary Refill: Capillary refill takes less than 2 seconds.     Findings: No ecchymosis, erythema, rash or wound.     Comments: No zoster rash  Neurological:     General: No focal deficit present.     Mental Status: She is alert and oriented to person, place, and time.     GCS: GCS eye subscore is 4. GCS verbal subscore is 5. GCS motor subscore is 6.     Cranial Nerves: Cranial nerves 2-12 are intact.     Sensory: Sensation is intact.     Motor: Motor function is intact.     Coordination: Coordination is intact.  Psychiatric:        Attention and Perception: Attention normal.        Mood and Affect: Mood normal.        Speech: Speech normal.        Behavior: Behavior normal.     ED Results / Procedures / Treatments   Labs (all labs ordered are listed, but only abnormal results are displayed) Labs Reviewed  COMPREHENSIVE METABOLIC PANEL - Abnormal; Notable for the following components:      Result Value   Glucose, Bld 115 (*)    Albumin 3.4 (*)    Total Bilirubin <0.1 (*)    All other components within normal limits  LIPASE, BLOOD - Abnormal; Notable for the following components:   Lipase 54 (*)    All other components within normal limits  CBC WITH DIFFERENTIAL/PLATELET  URINALYSIS, ROUTINE W  REFLEX MICROSCOPIC  LACTIC ACID, PLASMA  URINALYSIS, W/ REFLEX TO CULTURE (INFECTION SUSPECTED)  D-DIMER, QUANTITATIVE  BRAIN NATRIURETIC PEPTIDE  TROPONIN I (HIGH SENSITIVITY)    EKG None  Radiology No results found.  Procedures Procedures  {Document cardiac monitor, telemetry assessment procedure when appropriate:1}  Medications Ordered in ED Medications  sodium chloride 0.9 % bolus 500 mL (has no administration in time range)  ondansetron (ZOFRAN) injection 4 mg (has no administration in time range)  ondansetron (ZOFRAN-ODT) disintegrating tablet 8 mg (has no administration in time range)    ED Course/ Medical Decision Making/ A&P   {   Click here for ABCD2, HEART and other calculatorsREFRESH Note before signing :1}                          Medical Decision Making Amount and/or Complexity of Data Reviewed Labs: ordered.  Risk Prescription drug management.   ***  {Document critical care time when appropriate:1} {Document review of labs and clinical decision tools ie heart score, Chads2Vasc2 etc:1}  {Document your independent review of radiology images, and any outside records:1} {Document your discussion with family members, caretakers, and with consultants:1} {Document social determinants of health affecting pt's care:1} {Document your decision making why or why not admission, treatments were needed:1} Final Clinical Impression(s) / ED Diagnoses Final diagnoses:  None    Rx / DC Orders ED Discharge Orders     None

## 2023-04-18 NOTE — ED Triage Notes (Signed)
Google translate used for triage.   Right sided flank pain x 1 week. Denies n/v/d. Pain seems to be mid right side.   Hx of cholecystectomy.

## 2023-04-19 ENCOUNTER — Other Ambulatory Visit: Payer: Self-pay

## 2023-04-19 ENCOUNTER — Emergency Department (HOSPITAL_COMMUNITY): Payer: Self-pay

## 2023-04-19 LAB — LACTIC ACID, PLASMA: Lactic Acid, Venous: 0.9 mmol/L (ref 0.5–1.9)

## 2023-04-19 LAB — BRAIN NATRIURETIC PEPTIDE: B Natriuretic Peptide: 65.9 pg/mL (ref 0.0–100.0)

## 2023-04-19 LAB — TROPONIN I (HIGH SENSITIVITY): Troponin I (High Sensitivity): 3 ng/L (ref <18)

## 2023-04-19 LAB — D-DIMER, QUANTITATIVE: D-Dimer, Quant: <0.27 ug/mL-FEU (ref 0.00–0.50)

## 2023-04-19 MED ORDER — PANTOPRAZOLE SODIUM 40 MG PO TBEC
40.0000 mg | DELAYED_RELEASE_TABLET | Freq: Every day | ORAL | 3 refills | Status: AC
  Filled 2023-04-19: qty 30, 30d supply, fill #0
  Filled 2023-04-25 – 2023-06-27 (×3): qty 30, 30d supply, fill #1
  Filled 2023-07-27 – 2023-07-28 (×2): qty 30, 30d supply, fill #2
  Filled 2023-09-05 – 2023-09-06 (×2): qty 30, 30d supply, fill #3

## 2023-04-19 MED ORDER — MORPHINE SULFATE (PF) 4 MG/ML IV SOLN
4.0000 mg | Freq: Once | INTRAVENOUS | Status: AC
  Administered 2023-04-19: 4 mg via INTRAVENOUS
  Filled 2023-04-19: qty 1

## 2023-04-19 MED ORDER — IOHEXOL 350 MG/ML SOLN
100.0000 mL | Freq: Once | INTRAVENOUS | Status: AC | PRN
  Administered 2023-04-19: 100 mL via INTRAVENOUS

## 2023-04-19 MED ORDER — FUROSEMIDE 20 MG PO TABS
20.0000 mg | ORAL_TABLET | Freq: Every day | ORAL | 0 refills | Status: DC | PRN
  Filled 2023-04-19: qty 30, 30d supply, fill #0

## 2023-04-19 MED ORDER — METOCLOPRAMIDE HCL 5 MG/ML IJ SOLN
10.0000 mg | Freq: Once | INTRAMUSCULAR | Status: AC
  Administered 2023-04-19: 10 mg via INTRAVENOUS
  Filled 2023-04-19: qty 2

## 2023-04-25 ENCOUNTER — Other Ambulatory Visit: Payer: Self-pay | Admitting: Internal Medicine

## 2023-04-25 ENCOUNTER — Other Ambulatory Visit: Payer: Self-pay

## 2023-04-25 ENCOUNTER — Other Ambulatory Visit: Payer: Self-pay | Admitting: Emergency Medicine

## 2023-04-25 ENCOUNTER — Other Ambulatory Visit: Payer: Self-pay | Admitting: Physician Assistant

## 2023-04-25 DIAGNOSIS — L309 Dermatitis, unspecified: Secondary | ICD-10-CM

## 2023-04-25 MED ORDER — TRIAMCINOLONE ACETONIDE 0.1 % EX CREA
1.0000 | TOPICAL_CREAM | Freq: Two times a day (BID) | CUTANEOUS | 0 refills | Status: DC | PRN
  Filled 2023-04-25: qty 45, 23d supply, fill #0

## 2023-04-25 MED ORDER — ESCITALOPRAM OXALATE 10 MG PO TABS
10.0000 mg | ORAL_TABLET | Freq: Every day | ORAL | 0 refills | Status: DC
  Filled 2023-04-25 – 2023-05-09 (×4): qty 90, 90d supply, fill #0

## 2023-04-25 NOTE — Telephone Encounter (Signed)
Requested Prescriptions  Pending Prescriptions Disp Refills   escitalopram (LEXAPRO) 10 MG tablet 90 tablet 0    Sig: TAKE 1 TABLET (10 MG TOTAL) BY MOUTH DAILY.     Psychiatry:  Antidepressants - SSRI Passed - 04/25/2023 12:56 PM      Passed - Completed PHQ-2 or PHQ-9 in the last 360 days      Passed - Valid encounter within last 6 months    Recent Outpatient Visits           2 months ago No-show for appointment   [facility] Jonah Blue B, MD   4 months ago Diabetes mellitus type 2 in obese [facility])   [place] [facility] [place], [place], [place]   7 months ago Acute respiratory infection   [place] [facility] Jonah Blue B, MD   11 months ago Diabetes mellitus type 2 in obese [facility])   [place] [facility] Marcine Matar, MD   1 year ago Plantar fasciitis   [place] [facility] [place], [place], [place]       Future Appointments             In 3 days Hilty, Lisette Abu, MD [facility], [place]   In 1 month [place] Charlcie Cradle, MD [facility]   In 2 months Marcine Matar, MD [facility]

## 2023-04-25 NOTE — Telephone Encounter (Signed)
Requested medication (s) are due for refill today: Yes  Requested medication (s) are on the active medication list: Yes  Last refill:  12/21/22  Future visit scheduled: Yes  Notes to clinic:  Unable to refill per protocol, cannot delegate.      Requested Prescriptions  Pending Prescriptions Disp Refills   triamcinolone cream (KENALOG) 0.1 % 45 g 2    Sig: Apply 1 Application topically 2 (two) times daily as needed.     Not Delegated - Dermatology:  Corticosteroids Failed - 04/25/2023 12:56 PM      Failed - This refill cannot be delegated      Passed - Valid encounter within last 12 months    Recent Outpatient Visits           2 months ago No-show for appointment   [facility] Jonah Blue B, MD   4 months ago Diabetes mellitus type 2 in obese [facility])   [place] [facility] [place], [place]   7 months ago Acute respiratory infection   [facility] Jonah Blue B, MD   11 months ago Diabetes mellitus type 2 in obese [facility])   [place] [facility] Marcine Matar, MD   1 year ago Plantar fasciitis   [place] [facility] [place], [place], [place]       Future Appointments             In 3 days Hilty, Lisette Abu, MD [facility] at [facility], [place]   In 1 month [place] Charlcie Cradle, MD [facility]   In 2 months Marcine Matar, MD [facility]

## 2023-04-26 ENCOUNTER — Other Ambulatory Visit (HOSPITAL_COMMUNITY): Payer: Self-pay

## 2023-04-26 ENCOUNTER — Other Ambulatory Visit: Payer: Self-pay

## 2023-04-28 ENCOUNTER — Ambulatory Visit (HOSPITAL_BASED_OUTPATIENT_CLINIC_OR_DEPARTMENT_OTHER): Payer: Self-pay | Admitting: Internal Medicine

## 2023-04-28 ENCOUNTER — Other Ambulatory Visit: Payer: Self-pay

## 2023-04-28 ENCOUNTER — Other Ambulatory Visit (HOSPITAL_COMMUNITY): Payer: Self-pay

## 2023-05-01 ENCOUNTER — Other Ambulatory Visit: Payer: Self-pay

## 2023-05-09 ENCOUNTER — Other Ambulatory Visit: Payer: Self-pay

## 2023-05-09 ENCOUNTER — Other Ambulatory Visit: Payer: Self-pay | Admitting: Internal Medicine

## 2023-05-09 MED ORDER — TRAZODONE HCL 50 MG PO TABS
25.0000 mg | ORAL_TABLET | Freq: Every evening | ORAL | 0 refills | Status: DC
  Filled 2023-05-09: qty 15, 30d supply, fill #0

## 2023-05-10 ENCOUNTER — Other Ambulatory Visit: Payer: Self-pay

## 2023-05-11 ENCOUNTER — Other Ambulatory Visit: Payer: Self-pay

## 2023-05-12 ENCOUNTER — Other Ambulatory Visit: Payer: Self-pay

## 2023-05-22 ENCOUNTER — Other Ambulatory Visit: Payer: Self-pay | Admitting: Internal Medicine

## 2023-05-22 ENCOUNTER — Other Ambulatory Visit: Payer: Self-pay

## 2023-05-22 MED ORDER — FUROSEMIDE 20 MG PO TABS
20.0000 mg | ORAL_TABLET | Freq: Every day | ORAL | 0 refills | Status: DC | PRN
  Filled 2023-05-22 – 2023-06-27 (×2): qty 30, 30d supply, fill #0

## 2023-05-29 ENCOUNTER — Other Ambulatory Visit: Payer: Self-pay

## 2023-05-31 ENCOUNTER — Ambulatory Visit: Payer: Self-pay | Admitting: Critical Care Medicine

## 2023-05-31 NOTE — Progress Notes (Deleted)
Acute Office Visit  Subjective:     Patient ID: Heather [name], female    DOB: [birth year], 67 y.o.   MRN: [id number]  No chief complaint on file.   HPI Patient is in today for knee swelling  Pcp johnson   ROS      Objective:    There were no vitals taken for this visit. {Vitals History (Optional):23777}  Physical Exam  No results found for any visits on 05/31/23.      Assessment & Plan:   Problem List Items Addressed This Visit   None   No orders of the defined types were placed in this encounter.   No follow-ups on file.  Shan Levans, MD

## 2023-06-13 NOTE — Progress Notes (Unsigned)
[facility] [address] [place]. [place] [address] [id number]       OFFICE FOLLOW-UP NOTE  [name] Date of Birth:  10/14/1955 Medical Record Number:  [id number]    Reason for visit: Stroke and seizure follow-up   Chief complaint: No chief complaint on file.     HPI:   Update 06/14/2023 JM: Patient returns for follow-up visit.  Overall stable without new stroke/TIA symptoms or seizure activity.  Compliant on Keppra 500 mg twice daily, tolerating well.  On Brilinta, pravastatin and Zetia.  Routinely follows with PCP for stroke risk factor management as well as currently being followed by lipid clinic.       History provided for reference purposes only Update 06/13/2022 JM: Patient returns for yearly seizure and stroke follow-up visit accompanied by [facility] health interpreter.  Denies any new stroke/TIA symptoms or seizure activity.  Continued very occasional facial twitching, stable since prior visit.  Occasional tension type headaches, stable since prior visit.  Compliant on Keppra 500 mg twice daily, denies side effects.  She did have follow-up with Dr. Corliss Skains back in October with repeat MRA showing patent stent and no other new or progressive findings.   She has remained on Brilinta and pravastatin -was recently referred to lipid clinic by PCP due to difficulty tolerating statins Blood pressure 154/94.   Closely followed by PCP Dr. Laural Benes  No further neurological concerns at this time  Update 05/13/2021 JM: Returns for yearly routine follow-up visit accompanied by [facility] interpreter.  Reports over the past 6 months, occasional left mouth tension/pulling sensation usually in the evenings.  Denies any other associated symptoms such as visual changes, headache, pain, speech difficulties or focal weakness.  Continued chronic intermittent tension type frontal headaches more recently on the right side likely due to dental  carries but unable to have tooth removal (per pt) due to use of Brilinta. Repeat MRA 07/2020 patent R M1 stent -recommended 57-month follow-up imaging with Dr. Corliss Skains. Not yet completed - she is requesting a referral to IR for repeat imaging.  Recently ran out of Crestor previously taking without side effects.  Blood pressure today 131/89.  Remains on Keppra 500 mg twice daily tolerating without recurrent seizure activity.  No further concerns at this time.   Update 05/12/2020 JM: Heather [name] returns for stroke follow-up.  She is accompanied by interpreter  Stable since prior visit without new stroke/TIA symptoms Continues on aspirin and Brilinta (post stent per IR) with easy bruising but no bleeding Continue on Crestor without myalgias Blood pressure today 115/77  No reoccurring seizure type activity or events Continues on Keppra 500 mg twice daily without side effects  She does report chronic intermittent vertigo and requesting refill of meclizine   Update 11/13/2019:  Stable.  Remains on Keppra without seizure activity ?  Atorvastatin side effect therefore Crestor initiated Blood pressure stable Cerebral angiogram 10/31/2019 50 to 70% patency of the stented segment of right MCA and recommended MRA in 6 months time.   Continues on aspirin and Brilinta as instructed by IR without bleeding or bruising.     05/29/2019 update:  New onset seizure 03/23/2019 -ED eval with reported tonic-clonic seizure Likely late effect of stroke Initiated Keppra without reoccurring seizure activity  Update 11/26/2018 :  Single episode of right-sided facial twitching lasting approximately 10 minutes of unknown etiology CTA 50% IntraStent stenosis  Cerebral angiogram 30 to 40% IntraStent restenosis EEG negative MRI no acute abnormality  Update  08/16/2018:  Stable C/o right-sided intermittent neck pain evaluated by PCP Compliant with aspirin and Brilinta  05/16/2018 visit:  Mild diminished fine motor  skills left hand post stroke Patient reported 85% recovery Cerebral angiogram 03/06/2018 50% right MCA IntraStent stenosis  Stroke admission 12/2017: Heather [name] is an 67 y.o. female Hispanic with PMH of HTN,Obesity who presents [facility] emergency room as a stroke alert with left hemiparesis. Patient is Spanish-speaking and initially EMS was told that her symptoms began at 9 PM after she dropped a glass in her left hand. CT head showed no hemorrhage and tPA was mixed. However after nurse spoke to her in spanish, patient stated that she had a headache that began around 6:30pm and had left side weakness as she was outside window period upon completion of CT scan.   CTA head and neck was performed which showed a superior division M2 cutoff. After discussing with family given her borderline NIHSS of 7, however worsening symptoms ( from EMS assessment to arrival at hospital)  we decided to take the patient for mechanical thrombectomy.  Date last known well:3.17.19Time last known well: around  6.30 pm tPA Given: no, outside window at time of CT scan NIHSS: 7 ( at time of arrival)  Baseline MRS 0  . After written informed consent from the patient and family She was taken to the interventional radiology suite for thrombectomy and right middle cerebral artery  Rescue stent placement performed by Dr. Corliss Skains for progressive occlusion of the inferior division of the right middle cerebral artery due to underlying stenosis. She also received 10 mg of superselective into arterial Integrilin. She was given loading dose of 180 mg of Brilinta and aspirin. The patient was initially and in the neurological intensive care unit and closely monitored she did well. MRI scan of the brain showed a moderate-sized right MCA infarct.Marland Kitchen LDL cholesterol 67 mg percent and women A1c was 6.7. Transthoracic echo showed normal ejection fraction. Patient had initial right gaze preference and left hemiplegia but made gradual improvement. She  was seen by physical occupational and speech therapy. She was transferred to inpatient rehabilitation and has done well.      ROS:   14 system review of systems is positive for those listed in HPI and all other systems negative  PMH:  Past Medical History:  Diagnosis Date   Depression    Diabetes (HCC)    Hypertension    Seizure (HCC)    Stroke (HCC)     Social History:  Social History   Socioeconomic History   Marital status: Single    Spouse name: Not on file   Number of children: Not on file   Years of education: Not on file   Highest education level: Not on file  Occupational History   Not on file  Tobacco Use   Smoking status: Former    Current packs/day: 0.00    Average packs/day: 0.5 packs/day for 30.0 years (15.0 ttl pk-yrs)    Types: Cigarettes    Start date: 12/05/1987    Quit date: 12/04/2017    Years since quitting: 5.5   Smokeless tobacco: Never  Vaping Use   Vaping status: Never Used  Substance and Sexual Activity   Alcohol use: Not Currently    Comment: weekly   Drug use: No   Sexual activity: Not Currently    Birth control/protection: None  Other Topics Concern   Not on file  Social History Narrative   Not on file  Social Determinants of Health   Financial Resource Strain: Not on file  Food Insecurity: Not on file  Transportation Needs: Not on file  Physical Activity: Not on file  Stress: Not on file  Social Connections: Not on file  Intimate Partner Violence: Not on file    Medications:   Current Outpatient Medications on File Prior to Visit  Medication Sig Dispense Refill   acetaminophen (TYLENOL) 500 MG tablet Take 1,000 mg by mouth every 8 (eight) hours as needed for moderate pain or headache.     Blood Glucose Monitoring Suppl (TRUE METRIX METER) w/Device KIT Use 4 (four) times daily to check blood sugar 1 kit 0   Cholecalciferol (VITAMIN D3 PO) Take 1 tablet by mouth daily.     diclofenac Sodium (VOLTAREN) 1 % GEL Apply 2 g  topically 2 (two) times daily as needed (apply to knees). 100 g 1   escitalopram (LEXAPRO) 10 MG tablet Take 1 tablet (10 mg total) by mouth daily. 90 tablet 0   ezetimibe (ZETIA) 10 MG tablet Take 1 tablet (10 mg total) by mouth daily. 90 tablet 3   furosemide (LASIX) 20 MG tablet Take 1 tablet (20 mg total) by mouth daily as needed for fluid. 30 tablet 0   glimepiride (AMARYL) 1 MG tablet Take 1 tablet (1 mg total) by mouth daily with breakfast. 90 tablet 1   glucose blood (TRUE METRIX BLOOD GLUCOSE TEST) test strip Use as instructed 100 each 2   levETIRAcetam (KEPPRA) 500 MG tablet Take 1 tablet (500 mg total) by mouth 2 (two) times daily. (Patient taking differently: Take 500 mg by mouth as needed.) 180 tablet 4   meclizine (ANTIVERT) 12.5 MG tablet Take 1 tablet (12.5 mg total) by mouth 2 (two) times daily as needed for dizziness. 100 tablet 3   pantoprazole (PROTONIX) 40 MG tablet Take 1 tablet (40 mg total) by mouth daily. 30 tablet 3   pravastatin (PRAVACHOL) 10 MG tablet Take 1 tablet (10 mg total) by mouth daily. 90 tablet 3   ticagrelor (BRILINTA) 90 MG TABS tablet Take 1 tablet (90 mg total) by mouth 2 (two) times daily. 60 tablet 3   traMADol (ULTRAM) 50 MG tablet Take by mouth every 6 (six) hours as needed.     traZODone (DESYREL) 50 MG tablet Take 0.5 tablets (25 mg total) by mouth Nightly. 15 tablet 0   triamcinolone cream (KENALOG) 0.1 % Apply 1 Application topically 2 (two) times daily as needed. 45 g 0   TRUEplus Lancets 28G MISC Use 4 times daily 100 each 3   No current facility-administered medications on file prior to visit.    Allergies:  No Known Allergies  Physical Exam  There were no vitals filed for this visit.  There is no height or weight on file to calculate BMI.   General: Well-nourished, well-developed pleasant middle-age  Hispanic Spanish-speaking lady, seated, in no evident distress Neck: supple with no carotid or supraclavicular bruits Cardiovascular:  regular rate and rhythm, no murmurs Vascular:  Normal pulses all extremities  Neurologic Exam Mental Status: Awake and fully alert.  Primary Spanish speaking.  Denies speech or language difficulty.  Oriented to place and time. Recent and remote memory intact. Attention span, concentration and fund of knowledge appropriate. Mood and affect appropriate.  Cranial Nerves: Pupils equal, briskly reactive to light. Extraocular movements full without nystagmus. Visual fields full to confrontation. Hearing intact. Facial sensation intact.  Mild left facial asymmetry when smiling.  Tongue and palate  moves normally and symmetrically.  Motor: Normal bulk and tone. Normal strength in all tested extremity muscles.  Sensory.: intact to touch ,pinprick .position and vibratory sensation.  Coordination: Rapid alternating movements normal in all extremities. Finger-to-nose and heel-to-shin performed accurately bilaterally. Gait and Station: Arises from chair without difficulty. Stance is normal. Gait demonstrates normal stride length and balance without use of assistive device. Able to heel, toe and tandem walk without difficulty.  Reflexes: 1+ and symmetric. Toes downgoing.      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 Vascular risk factors of intracranial stenosis, diabetes, hypertension and hyperlipidemia.  Prior hospitalization 11/18/2018 with transient facial twitchings of unclear etiology. Presented to ED on 03/23/2019 with tonic-clonic seizure activity with Keppra initiated.       1.  Right MCA infract s/p MCA stent - Continue Brilinta, Zetia and pravastatin for secondary stroke prevention managed/prescribed by PCP -Advised to reschedule follow-up visit with lipid clinic, prior LDL 107 which is above goal of 70 despite compliance on  pravastatin and Zetia. May need to consider PCSK9 inhibitor but will defer to lipid clinic -Close PCP follow-up for aggressive stroke risk factor management including HTN with BP goal<130/90, HLD with LDL goal<70 and DM with A1c goal<7 -Followed by IR post MCA stent - MRA head 08/2022 patent stent   2.  Seizure, post stroke -No recurrent seizure type activity or symptoms -Continue taking Keppra 500 mg BID for seizure prophylaxis - refill provided  -If facial twitching become more persistent or frequent, can consider increasing Keppra dosage       Follow-up in 1 year or call earlier if needed     CC:  Marcine Matar, MD    I spent 31 minutes of face-to-face and non-face-to-face time with patient assisted by interpreter.  This included previsit chart review, lab review, study review, order entry, electronic health record documentation, patient education regarding history of right MCA stroke, post stroke seizures, intracranial stenosis, secondary stroke prevention measures and importance of managing stroke risk factors, and answered all other questions to patient satisfaction  Ihor Austin, AGNP-BC  [facility] [address] [place], [place] [address]  Phone [phone] Fax [phone] Note: This document was prepared with digital dictation and possible smart phrase technology. Any transcriptional errors that result from this process are unintentional.

## 2023-06-14 ENCOUNTER — Ambulatory Visit: Payer: Self-pay | Admitting: Adult Health

## 2023-06-27 ENCOUNTER — Other Ambulatory Visit: Payer: Self-pay | Admitting: Internal Medicine

## 2023-06-27 ENCOUNTER — Other Ambulatory Visit: Payer: Self-pay

## 2023-06-27 DIAGNOSIS — L309 Dermatitis, unspecified: Secondary | ICD-10-CM

## 2023-06-27 MED ORDER — TRAZODONE HCL 50 MG PO TABS
25.0000 mg | ORAL_TABLET | Freq: Every evening | ORAL | 0 refills | Status: DC
  Filled 2023-06-27 – 2023-06-28 (×2): qty 15, 30d supply, fill #0

## 2023-06-27 MED ORDER — TRIAMCINOLONE ACETONIDE 0.1 % EX CREA
1.0000 | TOPICAL_CREAM | Freq: Two times a day (BID) | CUTANEOUS | 0 refills | Status: AC | PRN
Start: 2023-06-27 — End: ?
  Filled 2023-06-27 – 2023-06-28 (×2): qty 45, 23d supply, fill #0

## 2023-06-28 ENCOUNTER — Other Ambulatory Visit: Payer: Self-pay

## 2023-07-03 ENCOUNTER — Ambulatory Visit: Payer: Self-pay | Attending: Internal Medicine | Admitting: Internal Medicine

## 2023-07-03 ENCOUNTER — Other Ambulatory Visit: Payer: Self-pay

## 2023-07-03 ENCOUNTER — Encounter: Payer: Self-pay | Admitting: Internal Medicine

## 2023-07-03 VITALS — BP 117/82 | HR 76 | Temp 98.3°F | Ht 67.0 in | Wt 255.0 lb

## 2023-07-03 DIAGNOSIS — F330 Major depressive disorder, recurrent, mild: Secondary | ICD-10-CM

## 2023-07-03 DIAGNOSIS — I69398 Other sequelae of cerebral infarction: Secondary | ICD-10-CM

## 2023-07-03 DIAGNOSIS — I771 Stricture of artery: Secondary | ICD-10-CM

## 2023-07-03 DIAGNOSIS — E669 Obesity, unspecified: Secondary | ICD-10-CM

## 2023-07-03 DIAGNOSIS — E785 Hyperlipidemia, unspecified: Secondary | ICD-10-CM

## 2023-07-03 DIAGNOSIS — R569 Unspecified convulsions: Secondary | ICD-10-CM

## 2023-07-03 DIAGNOSIS — Z7984 Long term (current) use of oral hypoglycemic drugs: Secondary | ICD-10-CM

## 2023-07-03 DIAGNOSIS — Z1211 Encounter for screening for malignant neoplasm of colon: Secondary | ICD-10-CM

## 2023-07-03 DIAGNOSIS — Z23 Encounter for immunization: Secondary | ICD-10-CM

## 2023-07-03 DIAGNOSIS — Z7985 Long-term (current) use of injectable non-insulin antidiabetic drugs: Secondary | ICD-10-CM

## 2023-07-03 DIAGNOSIS — E1169 Type 2 diabetes mellitus with other specified complication: Secondary | ICD-10-CM

## 2023-07-03 LAB — POCT GLYCOSYLATED HEMOGLOBIN (HGB A1C): HbA1c, POC (prediabetic range): 6 % (ref 5.7–6.4)

## 2023-07-03 LAB — GLUCOSE, POCT (MANUAL RESULT ENTRY): POC Glucose: 123 mg/dL — AB (ref 70–99)

## 2023-07-03 MED ORDER — LEVETIRACETAM 500 MG PO TABS
500.0000 mg | ORAL_TABLET | Freq: Two times a day (BID) | ORAL | 4 refills | Status: DC
  Filled 2023-07-03 – 2023-09-30 (×3): qty 180, 90d supply, fill #0
  Filled 2023-12-26 (×2): qty 180, 90d supply, fill #1
  Filled 2024-03-26 – 2024-03-27 (×2): qty 180, 90d supply, fill #2
  Filled 2024-06-19 – 2024-07-01 (×2): qty 180, 90d supply, fill #3

## 2023-07-03 MED ORDER — TRULICITY 0.75 MG/0.5ML ~~LOC~~ SOAJ
0.7500 mg | SUBCUTANEOUS | 2 refills | Status: DC
Start: 2023-07-03 — End: 2023-08-10
  Filled 2023-07-03: qty 2, 28d supply, fill #0
  Filled 2023-07-27 – 2023-07-28 (×2): qty 2, 28d supply, fill #1

## 2023-07-03 MED ORDER — ZOSTER VAC RECOMB ADJUVANTED 50 MCG/0.5ML IM SUSR
0.5000 mL | Freq: Once | INTRAMUSCULAR | 0 refills | Status: AC
Start: 2023-07-03 — End: 2023-07-03

## 2023-07-03 MED ORDER — ESCITALOPRAM OXALATE 20 MG PO TABS
20.0000 mg | ORAL_TABLET | Freq: Every day | ORAL | 4 refills | Status: DC
Start: 2023-07-03 — End: 2023-08-10
  Filled 2023-07-03: qty 30, 30d supply, fill #0

## 2023-07-03 NOTE — Progress Notes (Signed)
Patient ID: Heather [name], female    DOB: 12/24/1955  MRN: [id number]  CC: Diabetes (DM f/u. Nicki Reaper that Trazodone is not helping - waking up at 2 - 3 a.m. & unable to fall back asleep. Valentino Hue to flu vax)   Subjective: Heather [name] is a 67 y.o. female who presents for chronic ds management. Her concerns today include:  Hx of DM, HTN, HL,former smoker tob dep, CVA RT MCA territory, s/p thrombectomy followed by stenting of M2 by interventional radiology, sz disorder, depression   [employer] Language interpreter used during this encounter. #[phone] [id number]  Did not bring meds with her today.    MDD:  c/o feeling very depress over past several days. Thinks she feels this way because she has been away from her kids for a year.  Plans to visit them in [place] for Thanksgiving and stay until after christmas.  One of her grand-daughters will be coming today for a few days; she is excited about this.    DM: A1C 6.0, BS 123 Still on 1 mg Amaryl. Checks BS daily in a.m with highest being 137.  No low BS.   "I try to follow my diet and I try to walk."  Would like to get on pills to help her lose wgh. Also open to trying Ozempic Taking Pravachol 3x/wk. taking it daily causes too much body aches.  SZ: Continue to take Keppra 500 mg twice a day.  Wonders whether she still needs to take this medicine since she has not had seizure in a while.  Last saw neurologist 1 year ago.  She is also wondering when she is due for follow-up MRA of the head.  Last one was done November of last year with Dr. Corliss Skains.  HM: Yes to flu vaccine.  Yes to shingles vaccine but for another day.  Due for colon cancer screening. 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   Major depression in remission (HCC) 06/16/2020   Stenosis of intracranial vessel 06/16/2020   Seizures (HCC) 03/23/2019   TIA (transient ischemic attack) 11/18/2018   Depression 02/21/2018    Essential hypertension 02/21/2018   Cerebrovascular accident (CVA) due to embolism of right middle cerebral artery (HCC) 02/21/2018   Diabetes mellitus type 2 in obese 12/20/2017   Right middle cerebral artery stroke (HCC) 12/19/2017   Left hemiparesis (HCC)    Dysphagia, oropharyngeal    Middle cerebral artery embolism, 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     Current Outpatient Medications on File Prior to Visit  Medication Sig Dispense Refill   acetaminophen (TYLENOL) 500 MG tablet Take 1,000 mg by mouth every 8 (eight) hours as needed for moderate pain or headache.     Blood Glucose Monitoring Suppl (TRUE METRIX METER) w/Device KIT Use 4 (four) times daily to check blood sugar 1 kit 0   Cholecalciferol (VITAMIN D3 PO) Take 1 tablet by mouth daily.     diclofenac Sodium (VOLTAREN) 1 % GEL Apply 2 g topically 2 (two) times daily as needed (apply to knees). 100 g 1   ezetimibe (ZETIA) 10 MG tablet Take 1 tablet (10 mg total) by mouth daily. 90 tablet 3   furosemide (LASIX) 20 MG tablet Take 1 tablet (20 mg total) by mouth daily as needed for fluid. 30 tablet 0   glimepiride (AMARYL) 1 MG tablet Take 1 tablet (1 mg total) by mouth daily with breakfast. 90 tablet 1   glucose blood (  TRUE METRIX BLOOD GLUCOSE TEST) test strip Use as instructed 100 each 2   meclizine (ANTIVERT) 12.5 MG tablet Take 1 tablet (12.5 mg total) by mouth 2 (two) times daily as needed for dizziness. 100 tablet 3   pantoprazole (PROTONIX) 40 MG tablet Take 1 tablet (40 mg total) by mouth daily. 30 tablet 3   pravastatin (PRAVACHOL) 10 MG tablet Take 1 tablet (10 mg total) by mouth daily. 90 tablet 3   ticagrelor (BRILINTA) 90 MG TABS tablet Take 1 tablet (90 mg total) by mouth 2 (two) times daily. 60 tablet 3   traMADol (ULTRAM) 50 MG tablet Take by mouth every 6 (six) hours as needed.     traZODone (DESYREL) 50 MG tablet Take 0.5 tablets (25 mg total) by mouth Nightly. 15 tablet 0   triamcinolone cream (KENALOG) 0.1 % Apply 1 Application  topically 2 (two) times daily as needed. 45 g 0   TRUEplus Lancets 28G MISC Use 4 times daily 100 each 3   No current facility-administered medications on file prior to visit.    No Known Allergies  Social History   Socioeconomic History   Marital status: Single    Spouse name: Not on file   Number of children: Not on file   Years of education: Not on file   Highest education level: Not on file  Occupational History   Not on file  Tobacco Use   Smoking status: Former    Current packs/day: 0.00    Average packs/day: 0.5 packs/day for 30.0 years (15.0 ttl pk-yrs)    Types: Cigarettes    Start date: 12/05/1987    Quit date: 12/04/2017    Years since quitting: 5.5   Smokeless tobacco: Never  Vaping Use   Vaping status: Never Used  Substance and Sexual Activity   Alcohol use: Not Currently    Comment: weekly   Drug use: No   Sexual activity: Not Currently    Birth control/protection: None  Other Topics Concern   Not on file  Social History Narrative   Not on file   Social Determinants of Health   Financial Resource Strain: Not on file  Food Insecurity: Not on file  Transportation Needs: Not on file  Physical Activity: Not on file  Stress: Not on file  Social Connections: Not on file  Intimate Partner Violence: Not on file    Family History  Problem Relation Age of Onset   Diabetes Mother    Pulmonary disease Mother    Heart disease Father    Stroke Sister     Past Surgical History:  Procedure Laterality Date   BREAST EXCISIONAL BIOPSY     BREAST SURGERY     right breast   CHOLECYSTECTOMY     IR ANGIO INTRA EXTRACRAN SEL COM CAROTID INNOMINATE BILAT MOD SED  03/06/2018   IR ANGIO INTRA EXTRACRAN SEL COM CAROTID INNOMINATE BILAT MOD SED  11/20/2018   IR ANGIO INTRA EXTRACRAN SEL COM CAROTID INNOMINATE BILAT MOD SED  10/31/2019   IR ANGIO VERTEBRAL SEL VERTEBRAL BILAT MOD SED  03/06/2018   IR ANGIO VERTEBRAL SEL VERTEBRAL BILAT MOD SED  11/20/2018   IR ANGIO  VERTEBRAL SEL VERTEBRAL BILAT MOD SED  10/31/2019   IR CT HEAD LTD  12/18/2017   IR INTRA CRAN STENT  12/18/2017   IR PERCUTANEOUS ART THROMBECTOMY/INFUSION INTRACRANIAL INC DIAG ANGIO  12/18/2017   IR RADIOLOGIST EVAL & MGMT  01/31/2018   IR US GUIDE VASC ACCESS RIGHT  10/31/2019  RADIOLOGY WITH ANESTHESIA N/A 12/18/2017   Procedure: RADIOLOGY WITH ANESTHESIA;  Surgeon: Julieanne Cotton, MD;  Location: [facility] OR;  Service: Radiology;  Laterality: N/A;    ROS: Review of Systems Negative except as stated above  PHYSICAL EXAM: BP 117/82 (BP Location: Left Arm, Patient Position: Sitting, Cuff Size: Large)   Pulse 76   Temp 98.3 F (36.8 C) (Oral)   Ht 5\' 7"  (1.702 m)   Wt 255 lb (115.7 kg)   SpO2 100%   BMI 39.94 kg/m   Wt Readings from Last 3 Encounters:  07/03/23 255 lb (115.7 kg)  04/18/23 240 lb (108.9 kg)  12/21/22 253 lb (114.8 kg)    Physical Exam  General appearance - alert, well appearing, morbidly obese Hispanic female and in no distress Mental status - normal mood, behavior, speech, dress, motor activity, and thought processes Neck - supple, no significant adenopathy Chest - clear to auscultation, no wheezes, rales or rhonchi, symmetric air entry Heart - normal rate, regular rhythm, normal S1, S2, no murmurs, rubs, clicks or gallops Extremities - peripheral pulses normal, no pedal edema, no clubbing or cyanosis      Latest Ref Rng & Units 04/18/2023    8:17 PM 12/21/2022   10:15 AM 07/18/2022    6:35 PM  CMP  Glucose 70 - 99 mg/dL 161  096  85   BUN 8 - 23 mg/dL 15  11  11    Creatinine 0.44 - 1.00 mg/dL 0.45  4.09  8.11   Sodium 135 - 145 mmol/L 137  139  136   Potassium 3.5 - 5.1 mmol/L 4.2  5.0  4.1   Chloride 98 - 111 mmol/L 103  105  103   CO2 22 - 32 mmol/L 24  19  24    Calcium 8.9 - 10.3 mg/dL 9.5  9.4  9.4   Total Protein 6.5 - 8.1 g/dL 7.0  7.0  7.2   Total Bilirubin 0.3 - 1.2 mg/dL <9.1  0.5  0.4   Alkaline Phos 38 - 126 U/L 62  85  69   AST 15 - 41 U/L  22  23  25    ALT 0 - 44 U/L 19  14  18     Lipid Panel     Component Value Date/Time   CHOL 175 12/21/2022 1015   TRIG 105 12/21/2022 1015   HDL 52 12/21/2022 1015   CHOLHDL 3.4 12/21/2022 1015   CHOLHDL 2.5 03/24/2019 0611   VLDL 15 03/24/2019 0611   LDLCALC 104 (H) 12/21/2022 1015    CBC    Component Value Date/Time   WBC 8.8 04/18/2023 2017   RBC 4.29 04/18/2023 2017   HGB 13.1 04/18/2023 2017   HGB 14.1 06/16/2020 1140   HCT 40.2 04/18/2023 2017   HCT 43.6 06/16/2020 1140   PLT 286 04/18/2023 2017   PLT 273 06/16/2020 1140   MCV 93.7 04/18/2023 2017   MCV 91 06/16/2020 1140   MCH 30.5 04/18/2023 2017   MCHC 32.6 04/18/2023 2017   RDW 13.2 04/18/2023 2017   RDW 12.4 06/16/2020 1140   LYMPHSABS 2.6 04/18/2023 2017   MONOABS 0.6 04/18/2023 2017   EOSABS 0.1 04/18/2023 2017   BASOSABS 0.0 04/18/2023 2017    ASSESSMENT AND PLAN: 1. Type 2 diabetes mellitus with obesity (HCC) At goal. We discussed trying her with Trulicity to help with weight loss and further maintain diabetes control.  I went over with her how the medication works and possible side effects.  She has not had any history of pancreatitis.  No history of thyroid disorder.  She has had her gallbladder removed.  Advised that the medication can cause nausea.  Advised that other side effects can include vomiting, pancreatitis, bowel blockage, diarrhea or severe constipation.  Advised that if she develops any vomiting or pain in the abdomen or severe diarrhea, she should stop the medicine and be seen.  We will start her on the low-dose of 0.75 mg once a week.  I had our clinical pharmacist meet with her today to be taught administration.  Once she gets and starts the Trulicity, I told her to stop the Amaryl. - POCT glycosylated hemoglobin (Hb A1C) - POCT glucose (manual entry) - Microalbumin / creatinine urine ratio - Dulaglutide (TRULICITY) 0.75 MG/0.5ML SOPN; Inject 0.75 mg into the skin once a week. STOP  Glimiperide (AMaryL)  Dispense: 2 mL; Refill: 2 - Lipid panel  2. Major depressive disorder, recurrent episode, mild (HCC) Not controlled.  We discussed increasing the Lexapro to 20 mg daily versus trying a different antidepressant.  We decided on increasing the Lexapro instead.  She denies any suicidal ideation at this time.  We discussed referral for some counseling but patient wants to hold off since she will be going out of town. - escitalopram (LEXAPRO) 20 MG tablet; Take 1 tablet (20 mg total) by mouth daily.  Dispense: 30 tablet; Refill: 4  3. Seizure as late effect of cerebrovascular accident (CVA) (HCC) Stable on Keppra.  Will get her back in with neurologist to decide whether the Keppra can be stopped. - levETIRAcetam (KEPPRA) 500 MG tablet; Take 1 tablet (500 mg total) by mouth 2 (two) times daily.  Dispense: 180 tablet; Refill: 4 - Ambulatory referral to Neurology  4. Hyperlipidemia associated with type 2 diabetes mellitus (HCC) Continue Pravachol taking it 3 times a week.  5. Stenosis of artery (HCC) We will have her follow-up with Dr. Silver Huguenin show at this fall for repeat imaging. - Ambulatory referral to Interventional Radiology  6. Need for shingles vaccine Prescription given for her to take to our pharmacy for the Shingrix - Zoster Vaccine Adjuvanted [facility]) injection; Inject 0.5 mLs into the muscle once for 1 dose.  Dispense: 0.5 mL; Refill: 0  7. Screening for colon cancer - Fecal occult blood, imunochemical([facility])  8. Encounter for immunization - Flu Vaccine Trivalent High Dose (Fluad)                There are no diagnoses linked to this encounter.   Patient was given the opportunity to ask questions.  Patient verbalized understanding of the plan and was able to repeat key elements of the plan.   This documentation was completed using Paediatric nurse.  Any transcriptional errors are unintentional.  Orders Placed This  Encounter  Procedures   Fecal occult blood, imunochemical([facility])   Flu Vaccine Trivalent High Dose (Fluad)   Microalbumin / creatinine urine ratio   Lipid panel   Ambulatory referral to Neurology   Ambulatory referral to Interventional Radiology   POCT glycosylated hemoglobin (Hb A1C)   POCT glucose (manual entry)     Requested Prescriptions   Signed Prescriptions Disp Refills   Zoster Vaccine Adjuvanted [facility]) injection 0.5 mL 0    Sig: Inject 0.5 mLs into the muscle once for 1 dose.   escitalopram (LEXAPRO) 20 MG tablet 30 tablet 4    Sig: Take 1 tablet (20 mg total) by mouth daily.   Dulaglutide (TRULICITY) 0.75 MG/0.5ML  SOPN 2 mL 2    Sig: Inject 0.75 mg into the skin once a week. STOP Glimiperide (AMaryL)   levETIRAcetam (KEPPRA) 500 MG tablet 180 tablet 4    Sig: Take 1 tablet (500 mg total) by mouth 2 (two) times daily.    Return in about 2 months (around 09/02/2023).  Jonah Blue, MD, FACP

## 2023-07-03 NOTE — Patient Instructions (Signed)
Start Trulicity 0.75 mg injection once a week. Stop glimepiride/Amaryl once you start the Trulicity. Increase Lexapro to 20 mg daily.

## 2023-07-04 LAB — MICROALBUMIN / CREATININE URINE RATIO
Creatinine, Urine: 91.4 mg/dL
Microalb/Creat Ratio: <3 mg/g{creat} (ref 0–29)
Microalbumin, Urine: <3 ug/mL

## 2023-07-06 ENCOUNTER — Other Ambulatory Visit: Payer: Self-pay

## 2023-07-21 ENCOUNTER — Telehealth: Payer: Self-pay

## 2023-07-21 NOTE — Telephone Encounter (Signed)
Copied from CRM [id number]. Topic: General - Other >> Jul 21, 2023 11:01 AM Phill Myron wrote: Just sending a reminder I need my annual MRI,  I leave for [place] in December. PLease advise

## 2023-07-27 ENCOUNTER — Other Ambulatory Visit: Payer: Self-pay | Admitting: Internal Medicine

## 2023-07-27 ENCOUNTER — Telehealth (HOSPITAL_COMMUNITY): Payer: Self-pay

## 2023-07-27 NOTE — Telephone Encounter (Signed)
-----   Message from Hoyt Koch sent at 07/24/2023  4:29 PM EDT ----- Dr. Corliss Skains was contacted by patient's PCP asking about need for repeat imaging. Per Dr. Corliss Skains patient may extend imaging to 2 years and should get CTA Head and Neck at that time.   Lannette Donath

## 2023-07-28 ENCOUNTER — Other Ambulatory Visit: Payer: Self-pay

## 2023-07-28 MED ORDER — TRAZODONE HCL 50 MG PO TABS
25.0000 mg | ORAL_TABLET | Freq: Every evening | ORAL | 0 refills | Status: DC
  Filled 2023-07-28: qty 45, 90d supply, fill #0

## 2023-07-31 NOTE — Telephone Encounter (Signed)
Pt is calling to follow up on the referral for her annual MRI. Please advise CB- [phone]

## 2023-07-31 NOTE — Telephone Encounter (Signed)
Provider has requested with specialist how long should the patient continue getting annual MRI

## 2023-08-01 ENCOUNTER — Other Ambulatory Visit: Payer: Self-pay

## 2023-08-09 ENCOUNTER — Ambulatory Visit: Payer: Self-pay

## 2023-08-09 NOTE — Telephone Encounter (Signed)
Yes. Thank you.  Let her know I reached out to the specialist on this.

## 2023-08-09 NOTE — Telephone Encounter (Signed)
  Chief Complaint: fell and slipped  Symptoms:right  lower back  and bruising Frequency: last night Pertinent Negatives: Patient denies hitting head LOC, no  other sx just where her right side hit the door Disposition: [] ED /[] Urgent Care (no appt availability in office) / [x] Appointment(In office/virtual)/ []  [place] [facility]/ [] Home Care/ [] Refused Recommended Disposition /[] [place] [place] [facility]/ []  Follow-up with PCP Additional Notes: in office appt tomorrow Reason for Disposition . [1] MODERATE back pain (e.g., interferes with normal activities) AND [2] present > 3 days    <3 days  Answer Assessment - Initial Assessment Questions 1. MECHANISM: "How did the fall happen?"     Slipped on water and  fell sitting side of her back hit the door 3. ONSET: "When did the fall happen?" (e.g., minutes, hours, or days ago)     Last night 4. LOCATION: "What part of the body hit the ground?" (e.g., back, buttocks, head, hips, knees, hands, head, stomach)    Lower  back right side  5. INJURY: "Did you hurt (injure) yourself when you fell?" If Yes, ask: "What did you injure? Tell me more about this?" (e.g., body area; type of injury; pain severity)"     Back pain 6. PAIN: "Is there any pain?" If Yes, ask: "How bad is the pain?" (e.g., Scale 1-10; or mild,  moderate, severe)   - NONE (0): No pain   - MILD (1-3): Doesn't interfere with normal activities    - MODERATE (4-7): Interferes with normal activities or awakens from sleep    - SEVERE (8-10): Excruciating pain, unable to do any normal activities      5-6 7. SIZE: For cuts, bruises, or swelling, ask: "How large is it?" (e.g., inches or centimeters)      bruise  9. OTHER SYMPTOMS: "Do you have any other symptoms?" (e.g., dizziness, fever, weakness; new onset or worsening).      no 10. CAUSE: "What do you think caused the fall (or falling)?" (e.g., tripped, dizzy spell)       Slipped on water  Answer Assessment - Initial Assessment  Questions 1. ONSET: "When did the pain begin?"      Last night  2. LOCATION: "Where does it hurt?" (upper, mid or lower back)     Right lower back  3. SEVERITY: "How bad is the pain?"  (e.g., Scale 1-10; mild, moderate, or severe)   - MILD (1-3): Doesn't interfere with normal activities.    - MODERATE (4-7): Interferes with normal activities or awakens from sleep.    - SEVERE (8-10): Excruciating pain, unable to do any normal activities.      moderate 5. RADIATION: "Does the pain shoot into your legs or somewhere else?"     no 6. CAUSE:  "What do you think is causing the back pain?"      Fell last night 7. BACK OVERUSE:  "Any recent lifting of heavy objects, strenuous work or exercise?"     no 8. MEDICINES: "What have you taken so far for the pain?" (e.g., nothing, acetaminophen, NSAIDS)     tylenol 9. NEUROLOGIC SYMPTOMS: "Do you have any weakness, numbness, or problems with bowel/bladder control?"     no 10. OTHER SYMPTOMS: "Do you have any other symptoms?" (e.g., fever, abdomen pain, burning with urination, blood in urine)       no  Protocols used: Falls and Falling-A-AH, Back Pain-A-AH

## 2023-08-09 NOTE — Telephone Encounter (Signed)
Pt is calling to follow up; she stated she typically gets an MRI done once a year.  Please advise.

## 2023-08-10 ENCOUNTER — Encounter: Payer: Self-pay | Admitting: Internal Medicine

## 2023-08-10 ENCOUNTER — Other Ambulatory Visit: Payer: Self-pay

## 2023-08-10 ENCOUNTER — Ambulatory Visit: Payer: Self-pay | Attending: Internal Medicine | Admitting: Internal Medicine

## 2023-08-10 VITALS — BP 127/88 | HR 78 | Temp 97.7°F | Ht 67.0 in | Wt 250.0 lb

## 2023-08-10 DIAGNOSIS — F330 Major depressive disorder, recurrent, mild: Secondary | ICD-10-CM

## 2023-08-10 DIAGNOSIS — E1169 Type 2 diabetes mellitus with other specified complication: Secondary | ICD-10-CM

## 2023-08-10 DIAGNOSIS — E66812 Obesity, class 2: Secondary | ICD-10-CM

## 2023-08-10 DIAGNOSIS — E669 Obesity, unspecified: Secondary | ICD-10-CM

## 2023-08-10 DIAGNOSIS — Z6839 Body mass index (BMI) 39.0-39.9, adult: Secondary | ICD-10-CM

## 2023-08-10 DIAGNOSIS — S300XXA Contusion of lower back and pelvis, initial encounter: Secondary | ICD-10-CM

## 2023-08-10 DIAGNOSIS — Z7985 Long-term (current) use of injectable non-insulin antidiabetic drugs: Secondary | ICD-10-CM

## 2023-08-10 DIAGNOSIS — Z7984 Long term (current) use of oral hypoglycemic drugs: Secondary | ICD-10-CM

## 2023-08-10 DIAGNOSIS — I10 Essential (primary) hypertension: Secondary | ICD-10-CM

## 2023-08-10 MED ORDER — ESCITALOPRAM OXALATE 20 MG PO TABS
20.0000 mg | ORAL_TABLET | Freq: Every day | ORAL | 1 refills | Status: DC
Start: 2023-08-10 — End: 2024-02-09
  Filled 2023-08-10: qty 90, 90d supply, fill #0
  Filled 2023-09-05 – 2023-11-06 (×3): qty 90, 90d supply, fill #1

## 2023-08-10 MED ORDER — SEMAGLUTIDE(0.25 OR 0.5MG/DOS) 2 MG/3ML ~~LOC~~ SOPN
0.2500 mg | PEN_INJECTOR | SUBCUTANEOUS | 1 refills | Status: DC
  Filled 2023-08-10: qty 3, 56d supply, fill #0
  Filled 2023-09-05: qty 3, 56d supply, fill #1
  Filled 2023-09-07: qty 3, 28d supply, fill #1

## 2023-08-10 NOTE — Telephone Encounter (Signed)
Spoke with interpeter [phone]- patient is aware the MRI is now  every two years

## 2023-08-10 NOTE — Progress Notes (Signed)
Patient ID: Heather [name], female    DOB: 03-16-1956  MRN: [id number]  CC: Fall (Slipped on 09/07/23 - caused hematoma on lower R side of back, pain/Discuss Ozempic )   Subjective: Heather [name] is a 67 y.o. female who presents for chronic ds management. Her concerns today include:  Hx of DM, HTN, HL,former smoker tob dep, CVA RT MCA territory, s/p thrombectomy followed by stenting of M2 by interventional radiology, sz disorder, depression   [employer] Language interpreter used during this encounter. #[place] [id number]  C/o pain RT buttock after she slipped on a child's toy and fell in her kitchen 08/08/2023 No dizziness at the time.  No LOC Sustained bruise to the area. Endorses icing area and taking Tylenol.  Pain has decreased.  C/o a lot of N/V, body aches and diarrhea since being on Trulicity that was started on last visit end of Sept. she stopped taking the medicine 1 week ago. Daughter and sister are on Ozempic and tolerating it well.  Wants to try to Ozempic instead.  Patient had called in earlier this week asking about scheduling for imaging of the head given history of stenting of intracerebral artery.  I did hear back from Dr. Corliss Skains who stated that imaging can be extended to 2 years and can get CT of the head and neck at that time.  Blood pressure noted to be elevated today.  She is not on any blood pressure medication as blood pressure has been good without meds.  Not eating too much salty foods 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   Major depression in remission (HCC) 06/16/2020   Stenosis of intracranial vessel 06/16/2020   Seizures (HCC) 03/23/2019   TIA (transient ischemic attack) 11/18/2018   Depression 02/21/2018   Essential hypertension 02/21/2018   Cerebrovascular accident (CVA) due to embolism of right middle cerebral artery (HCC) 02/21/2018   Type 2 diabetes mellitus with obesity (HCC)  12/20/2017   Right middle cerebral artery stroke (HCC) 12/19/2017   Left hemiparesis (HCC)    Dysphagia, oropharyngeal    Middle cerebral artery embolism, 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     Current Outpatient Medications on File Prior to Visit  Medication Sig Dispense Refill   acetaminophen (TYLENOL) 500 MG tablet Take 1,000 mg by mouth every 8 (eight) hours as needed for moderate pain or headache.     Blood Glucose Monitoring Suppl (TRUE METRIX METER) w/Device KIT Use 4 (four) times daily to check blood sugar 1 kit 0   Cholecalciferol (VITAMIN D3 PO) Take 1 tablet by mouth daily.     diclofenac Sodium (VOLTAREN) 1 % GEL Apply 2 g topically 2 (two) times daily as needed (apply to knees). 100 g 1   ezetimibe (ZETIA) 10 MG tablet Take 1 tablet (10 mg total) by mouth daily. 90 tablet 3   furosemide (LASIX) 20 MG tablet Take 1 tablet (20 mg total) by mouth daily as needed for fluid. 30 tablet 0   glimepiride (AMARYL) 1 MG tablet Take 1 tablet (1 mg total) by mouth daily with breakfast. 90 tablet 1   glucose blood (TRUE METRIX BLOOD GLUCOSE TEST) test strip Use as instructed 100 each 2   levETIRAcetam (KEPPRA) 500 MG tablet Take 1 tablet (500 mg total) by mouth 2 (two) times daily. 180 tablet 4   meclizine (ANTIVERT) 12.5 MG tablet Take 1 tablet (12.5 mg total) by mouth 2 (two) times daily as needed for  dizziness. 100 tablet 3   pantoprazole (PROTONIX) 40 MG tablet Take 1 tablet (40 mg total) by mouth daily. 30 tablet 3   pravastatin (PRAVACHOL) 10 MG tablet Take 1 tablet (10 mg total) by mouth daily. 90 tablet 3   ticagrelor (BRILINTA) 90 MG TABS tablet Take 1 tablet (90 mg total) by mouth 2 (two) times daily. 60 tablet 3   traMADol (ULTRAM) 50 MG tablet Take by mouth every 6 (six) hours as needed.     traZODone (DESYREL) 50 MG tablet Take 0.5 tablets (25 mg total) by mouth Nightly. 45 tablet 0   triamcinolone cream (KENALOG) 0.1 % Apply 1 Application topically 2 (two) times daily as needed. 45 g 0   TRUEplus  Lancets 28G MISC Use 4 times daily 100 each 3   No current facility-administered medications on file prior to visit.    No Known Allergies  Social History   Socioeconomic History   Marital status: Single    Spouse name: Not on file   Number of children: Not on file   Years of education: Not on file   Highest education level: Not on file  Occupational History   Not on file  Tobacco Use   Smoking status: Former    Current packs/day: 0.00    Average packs/day: 0.5 packs/day for 30.0 years (15.0 ttl pk-yrs)    Types: Cigarettes    Start date: 12/05/1987    Quit date: 12/04/2017    Years since quitting: 5.6   Smokeless tobacco: Never  Vaping Use   Vaping status: Never Used  Substance and Sexual Activity   Alcohol use: Not Currently    Comment: weekly   Drug use: No   Sexual activity: Not Currently    Birth control/protection: None  Other Topics Concern   Not on file  Social History Narrative   Not on file   Social Determinants of Health   Financial Resource Strain: Not on file  Food Insecurity: No Food Insecurity (08/10/2023)   Hunger Vital Sign    Worried About Running Out of Food in the Last Year: Never true    Ran Out of Food in the Last Year: Never true  Transportation Needs: No Transportation Needs (08/10/2023)   PRAPARE - Administrator, Civil Service (Medical): No    Lack of Transportation (Non-Medical): No  Physical Activity: Not on file  Stress: Not on file  Social Connections: Not on file  Intimate Partner Violence: Not At Risk (08/10/2023)   Humiliation, Afraid, Rape, and Kick questionnaire    Fear of Current or Ex-Partner: No    Emotionally Abused: No    Physically Abused: No    Sexually Abused: No    Family History  Problem Relation Age of Onset   Diabetes Mother    Pulmonary disease Mother    Heart disease Father    Stroke Sister     Past Surgical History:  Procedure Laterality Date   BREAST EXCISIONAL BIOPSY     BREAST SURGERY      right breast   CHOLECYSTECTOMY     IR ANGIO INTRA EXTRACRAN SEL COM CAROTID INNOMINATE BILAT MOD SED  03/06/2018   IR ANGIO INTRA EXTRACRAN SEL COM CAROTID INNOMINATE BILAT MOD SED  11/20/2018   IR ANGIO INTRA EXTRACRAN SEL COM CAROTID INNOMINATE BILAT MOD SED  10/31/2019   IR ANGIO VERTEBRAL SEL VERTEBRAL BILAT MOD SED  03/06/2018   IR ANGIO VERTEBRAL SEL VERTEBRAL BILAT MOD SED  11/20/2018  IR ANGIO VERTEBRAL SEL VERTEBRAL BILAT MOD SED  10/31/2019   IR CT HEAD LTD  12/18/2017   IR INTRA CRAN STENT  12/18/2017   IR PERCUTANEOUS ART THROMBECTOMY/INFUSION INTRACRANIAL INC DIAG ANGIO  12/18/2017   IR RADIOLOGIST EVAL & MGMT  01/31/2018   IR US GUIDE VASC ACCESS RIGHT  10/31/2019   RADIOLOGY WITH ANESTHESIA N/A 12/18/2017   Procedure: RADIOLOGY WITH ANESTHESIA;  Surgeon: Julieanne Cotton, MD;  Location: [facility] OR;  Service: Radiology;  Laterality: N/A;    ROS: Review of Systems Negative except as stated above  PHYSICAL EXAM: BP 127/88   Pulse 78   Temp 97.7 F (36.5 C) (Oral)   Ht 5\' 7"  (1.702 m)   Wt 250 lb (113.4 kg)   SpO2 97%   BMI 39.16 kg/m   Wt Readings from Last 3 Encounters:  08/10/23 250 lb (113.4 kg)  07/03/23 255 lb (115.7 kg)  04/18/23 240 lb (108.9 kg)    Physical Exam  General appearance - alert, well appearing, and in no distress Mental status -  Skin - pt has about 7x7 cm area of ecchymosis on the right upper hip.  Slight tender to touch.      Latest Ref Rng & Units 04/18/2023    8:17 PM 12/21/2022   10:15 AM 07/18/2022    6:35 PM  CMP  Glucose 70 - 99 mg/dL 875  643  85   BUN 8 - 23 mg/dL 15  11  11    Creatinine 0.44 - 1.00 mg/dL 3.29  5.18  8.41   Sodium 135 - 145 mmol/L 137  139  136   Potassium 3.5 - 5.1 mmol/L 4.2  5.0  4.1   Chloride 98 - 111 mmol/L 103  105  103   CO2 22 - 32 mmol/L 24  19  24    Calcium 8.9 - 10.3 mg/dL 9.5  9.4  9.4   Total Protein 6.5 - 8.1 g/dL 7.0  7.0  7.2   Total Bilirubin 0.3 - 1.2 mg/dL <6.6  0.5  0.4   Alkaline Phos 38 - 126  U/L 62  85  69   AST 15 - 41 U/L 22  23  25    ALT 0 - 44 U/L 19  14  18     Lipid Panel     Component Value Date/Time   CHOL 175 12/21/2022 1015   TRIG 105 12/21/2022 1015   HDL 52 12/21/2022 1015   CHOLHDL 3.4 12/21/2022 1015   CHOLHDL 2.5 03/24/2019 0611   VLDL 15 03/24/2019 0611   LDLCALC 104 (H) 12/21/2022 1015    CBC    Component Value Date/Time   WBC 8.8 04/18/2023 2017   RBC 4.29 04/18/2023 2017   HGB 13.1 04/18/2023 2017   HGB 14.1 06/16/2020 1140   HCT 40.2 04/18/2023 2017   HCT 43.6 06/16/2020 1140   PLT 286 04/18/2023 2017   PLT 273 06/16/2020 1140   MCV 93.7 04/18/2023 2017   MCV 91 06/16/2020 1140   MCH 30.5 04/18/2023 2017   MCHC 32.6 04/18/2023 2017   RDW 13.2 04/18/2023 2017   RDW 12.4 06/16/2020 1140   LYMPHSABS 2.6 04/18/2023 2017   MONOABS 0.6 04/18/2023 2017   EOSABS 0.1 04/18/2023 2017   BASOSABS 0.0 04/18/2023 2017    ASSESSMENT AND PLAN: 1. Hematoma of buttock Advised to start using some warm compresses to the area.  Continue Tylenol as needed.  She is on Brilinta.  Will have her continue it  but check CBC. - CBC; Future  2. Type 2 diabetes mellitus with obesity (HCC) Advised patient the nausea, vomiting and diarrhea are all possible side effects of GLP 1 agents and can occur with Trulicity and Ozempic.  On last visit I went over the possible side effects to watch out for including vomiting, abdominal pain, severe constipation or diarrhea.  I had told her that if these occur she should stop the medicine and give me a call. -Advised that if she had developed nausea, vomiting and diarrhea with Trulicity, she will likely have the same with Ozempic.  However patient still wanted to give the Ozempic a try stating that she will stop it if the same side effects occur.  I went over with her how to use the Ozempic.  I recommend that she has that the pharmacist show her how to dial up the dose once the medication is dispensed - Semaglutide,0.25 or 0.5MG /DOS,  2 MG/3ML SOPN; Inject 0.25 mg into the skin once a week.  Dispense: 3 mL; Refill: 1  3. Major depressive disorder, recurrent episode, mild (HCC) Requested RF on Lexapro - escitalopram (LEXAPRO) 20 MG tablet; Take 1 tablet (20 mg total) by mouth daily.  Dispense: 90 tablet; Refill: 1  4. Essential hypertension BP had been good off med but DBP elev today DASH discussed F/u with clinical pharmacist in 2 wks for recheck.  Low threshold to put her on blood pressure medication given previous history of CVA.  Patient was given the opportunity to ask questions.  Patient verbalized understanding of the plan and was able to repeat key elements of the plan.   This documentation was completed using Paediatric nurse.  Any transcriptional errors are unintentional.  Orders Placed This Encounter  Procedures   CBC     Requested Prescriptions   Signed Prescriptions Disp Refills   escitalopram (LEXAPRO) 20 MG tablet 90 tablet 1    Sig: Take 1 tablet (20 mg total) by mouth daily.   Semaglutide,0.25 or 0.5MG /DOS, 2 MG/3ML SOPN 3 mL 1    Sig: Inject 0.25 mg into the skin once a week.    Return in about 2 months (around 10/10/2023) for Appt with [facility] in 2 wks for BP check.  Jonah Blue, MD, FACP

## 2023-08-11 ENCOUNTER — Other Ambulatory Visit: Payer: Self-pay

## 2023-08-14 ENCOUNTER — Other Ambulatory Visit: Payer: Self-pay

## 2023-08-21 ENCOUNTER — Other Ambulatory Visit: Payer: Self-pay

## 2023-08-28 ENCOUNTER — Other Ambulatory Visit: Payer: Self-pay

## 2023-08-29 ENCOUNTER — Other Ambulatory Visit: Payer: Self-pay

## 2023-09-05 ENCOUNTER — Other Ambulatory Visit: Payer: Self-pay | Admitting: Physician Assistant

## 2023-09-05 DIAGNOSIS — E1169 Type 2 diabetes mellitus with other specified complication: Secondary | ICD-10-CM

## 2023-09-06 ENCOUNTER — Other Ambulatory Visit: Payer: Self-pay

## 2023-09-06 MED ORDER — GLIMEPIRIDE 1 MG PO TABS
1.0000 mg | ORAL_TABLET | Freq: Every day | ORAL | 1 refills | Status: DC
  Filled 2023-09-06: qty 90, 90d supply, fill #0
  Filled 2023-12-03: qty 90, 90d supply, fill #1

## 2023-09-07 ENCOUNTER — Other Ambulatory Visit: Payer: Self-pay

## 2023-09-14 ENCOUNTER — Encounter: Payer: Self-pay | Admitting: Pharmacist

## 2023-09-14 ENCOUNTER — Other Ambulatory Visit: Payer: Self-pay

## 2023-09-14 ENCOUNTER — Ambulatory Visit: Payer: Self-pay | Attending: Family Medicine | Admitting: Pharmacist

## 2023-09-14 VITALS — BP 126/83 | HR 77

## 2023-09-14 DIAGNOSIS — I10 Essential (primary) hypertension: Secondary | ICD-10-CM

## 2023-09-14 DIAGNOSIS — Z794 Long term (current) use of insulin: Secondary | ICD-10-CM

## 2023-09-14 DIAGNOSIS — Z7984 Long term (current) use of oral hypoglycemic drugs: Secondary | ICD-10-CM

## 2023-09-14 DIAGNOSIS — E1169 Type 2 diabetes mellitus with other specified complication: Secondary | ICD-10-CM

## 2023-09-14 DIAGNOSIS — E669 Obesity, unspecified: Secondary | ICD-10-CM

## 2023-09-14 MED ORDER — SEMAGLUTIDE(0.25 OR 0.5MG/DOS) 2 MG/3ML ~~LOC~~ SOPN
0.5000 mg | PEN_INJECTOR | SUBCUTANEOUS | 1 refills | Status: DC
  Filled 2023-09-14: qty 3, 28d supply, fill #0
  Filled 2023-10-10 – 2023-10-11 (×2): qty 3, 28d supply, fill #1

## 2023-09-14 NOTE — Progress Notes (Signed)
S:     No chief complaint on file.  67 y.o. female who presents for hypertension evaluation, education, and management.  PMH is significant for DM, HTN, HL,former smoker tob dep, CVA RT MCA territory, s/p thrombectomy followed by stenting of M2 by interventional radiology, sz disorder, depression.  Patient was referred and last seen by Primary Care Provider, Dr. Laural Benes, on 08/10/2023. BP at that visit was 127/88 mmHg. She was asked to come back to see me for a BP check.  Today, patient arrives in good spirits and presents without assistance. Denies dizziness, headache, blurred vision, swelling.   Patient reports hypertension is longstanding. She was on lisinopril several years ago but BP was okay without it so this was discontinued. Of note she does have a hx of T2DM but her urine microalbumin level was normal 07/03/23.   Family/Social history:  -Fhx: DM, heart disease, stroke  Medication adherence reported to all medications, however, she is not currently taking any antihypertensives. Of note, she tells me she is tolerating her Ozempic well and notes a 7lb wt loss since her visit with Dr. Laural Benes last month. She is requesting to increase to the 0.5 mg dose today.   Current antihypertensives include: none  Antihypertensives tried in the past include: lisinopril  Reported home BP readings:  -Reports SBP range in the 120s - 130s -Reports DBP range in the 70s - 80s  Patient reported dietary habits:  -Compliant with sodium restriction -Denies excessive intake of caffeine   Patient-reported exercise habits:  -Walks daily   O:  Vitals:   09/14/23 0910  BP: 126/83  Pulse: 77     Last 3 Office BP readings: BP Readings from Last 3 Encounters:  09/14/23 126/83  08/10/23 127/88  07/03/23 117/82    BMET    Component Value Date/Time   NA 137 04/18/2023 2017   NA 139 12/21/2022 1015   K 4.2 04/18/2023 2017   CL 103 04/18/2023 2017   CO2 24 04/18/2023 2017   GLUCOSE 115 (H)  04/18/2023 2017   BUN 15 04/18/2023 2017   BUN 11 12/21/2022 1015   CREATININE 0.62 04/18/2023 2017   CREATININE 0.53 09/05/2016 1017   CALCIUM 9.5 04/18/2023 2017   GFRNONAA >60 04/18/2023 2017   GFRAA 116 06/16/2020 1140    Renal function: CrCl cannot be calculated (Patient's most recent lab result is older than the maximum 21 days allowed.).  Clinical ASCVD: Yes  The ASCVD Risk score (Arnett DK, et al., 2019) failed to calculate for the following reasons:   Risk score cannot be calculated because patient has a medical history suggesting prior/existing ASCVD  Patient is participating in a Managed Medicaid Plan:  No    A/P: Hypertension longstanding currently no on medications. BP goal < 130/80 mmHg. Continue to monitor without medication for now.  -Counseled on lifestyle modifications for blood pressure control including reduced dietary sodium, increased exercise, adequate sleep. -Encouraged patient to check BP at home and bring log of readings to next visit. Counseled on proper use of home BP cuff.   Diabetes Type 2 diabetes longstanding currently under good control. A1c goal is <7%. She is tolerating her glimepiride and Ozempic well. Denies any hypoglycemia at home. Denies any NV, abdominal pain, or vision changes with the 0.25mg  weekly dose of Ozempic. Will increase to 0.5 mg weekly today.  -Continue glimepiride 1 mg daily.  -Increase Ozempic to 0.5 mg weekly.   Results reviewed and written information provided.    Written  patient instructions provided. Patient verbalized understanding of treatment plan.  Total time in face to face counseling 20 minutes.    Follow-up:  Pharmacist prn. PCP clinic visit in 10/12/2023.   Butch Penny, PharmD, Patsy [place], CPP Clinical Pharmacist [facility] [phone]

## 2023-09-18 ENCOUNTER — Other Ambulatory Visit: Payer: Self-pay

## 2023-09-22 ENCOUNTER — Other Ambulatory Visit: Payer: Self-pay

## 2023-09-30 ENCOUNTER — Other Ambulatory Visit: Payer: Self-pay | Admitting: Emergency Medicine

## 2023-09-30 ENCOUNTER — Other Ambulatory Visit: Payer: Self-pay | Admitting: Internal Medicine

## 2023-09-30 DIAGNOSIS — E669 Obesity, unspecified: Secondary | ICD-10-CM

## 2023-10-02 ENCOUNTER — Other Ambulatory Visit: Payer: Self-pay

## 2023-10-02 MED ORDER — TRUE METRIX BLOOD GLUCOSE TEST VI STRP
ORAL_STRIP | 2 refills | Status: DC
  Filled 2023-10-02 – 2023-11-04 (×2): qty 100, 30d supply, fill #0
  Filled 2023-12-03: qty 100, 30d supply, fill #1
  Filled 2023-12-26 – 2024-02-08 (×3): qty 100, 30d supply, fill #2

## 2023-10-02 MED ORDER — TRAZODONE HCL 50 MG PO TABS
25.0000 mg | ORAL_TABLET | Freq: Every evening | ORAL | 0 refills | Status: DC
  Filled 2023-10-02 – 2023-11-04 (×2): qty 45, 90d supply, fill #0

## 2023-10-10 ENCOUNTER — Other Ambulatory Visit: Payer: Self-pay | Admitting: Emergency Medicine

## 2023-10-11 ENCOUNTER — Other Ambulatory Visit: Payer: Self-pay

## 2023-10-12 ENCOUNTER — Ambulatory Visit: Payer: Self-pay | Attending: Internal Medicine | Admitting: Internal Medicine

## 2023-10-12 ENCOUNTER — Encounter: Payer: Self-pay | Admitting: Internal Medicine

## 2023-10-12 ENCOUNTER — Other Ambulatory Visit: Payer: Self-pay

## 2023-10-12 VITALS — BP 120/84 | HR 78 | Temp 97.8°F | Ht 67.0 in | Wt 253.0 lb

## 2023-10-12 DIAGNOSIS — L299 Pruritus, unspecified: Secondary | ICD-10-CM

## 2023-10-12 DIAGNOSIS — Z1211 Encounter for screening for malignant neoplasm of colon: Secondary | ICD-10-CM

## 2023-10-12 DIAGNOSIS — E669 Obesity, unspecified: Secondary | ICD-10-CM

## 2023-10-12 DIAGNOSIS — E1169 Type 2 diabetes mellitus with other specified complication: Secondary | ICD-10-CM

## 2023-10-12 DIAGNOSIS — I152 Hypertension secondary to endocrine disorders: Secondary | ICD-10-CM

## 2023-10-12 DIAGNOSIS — Z23 Encounter for immunization: Secondary | ICD-10-CM

## 2023-10-12 DIAGNOSIS — E1159 Type 2 diabetes mellitus with other circulatory complications: Secondary | ICD-10-CM

## 2023-10-12 DIAGNOSIS — Z7985 Long-term (current) use of injectable non-insulin antidiabetic drugs: Secondary | ICD-10-CM

## 2023-10-12 LAB — POCT GLYCOSYLATED HEMOGLOBIN (HGB A1C): HbA1c, POC (controlled diabetic range): 5.6 % (ref 0.0–7.0)

## 2023-10-12 LAB — GLUCOSE, POCT (MANUAL RESULT ENTRY): POC Glucose: 119 mg/dL — AB (ref 70–99)

## 2023-10-12 MED ORDER — FUROSEMIDE 20 MG PO TABS
20.0000 mg | ORAL_TABLET | Freq: Every day | ORAL | 0 refills | Status: AC | PRN
  Filled 2023-10-12: qty 30, 30d supply, fill #0

## 2023-10-12 MED ORDER — SEMAGLUTIDE (1 MG/DOSE) 4 MG/3ML ~~LOC~~ SOPN
1.0000 mg | PEN_INJECTOR | SUBCUTANEOUS | 1 refills | Status: DC
  Filled 2023-10-12: qty 3, 28d supply, fill #0

## 2023-10-12 MED ORDER — LISINOPRIL 2.5 MG PO TABS
2.5000 mg | ORAL_TABLET | Freq: Every day | ORAL | 3 refills | Status: DC
  Filled 2023-10-12: qty 30, 30d supply, fill #0

## 2023-10-12 NOTE — Progress Notes (Signed)
 Patient ID: Heather [name], female    DOB: 1956-02-21  MRN: [id number]  CC: Diabetes (DM f/u. Med refill. Raguel about fluid accumulation on R leg/Concern of elevated BP readings/Concern of L ear, itching, dryness X2 weeks/Yes to pneumonia vax.)   Subjective: Heather [name] is a 68 y.o. female who presents for 2 mth f/u BP check/DM. Her concerns today include:  Hx of DM, HTN, HL,former smoker tob dep, CVA RT MCA territory, s/p thrombectomy followed by stenting of M2 by interventional radiology, sz disorder, depression   [employer] Language interpreter used during this encounter. #[email or web address] [id number]  DM: Results for orders placed or performed in visit on 10/12/23  POCT glycosylated hemoglobin (Hb A1C)   Collection Time: 10/12/23  9:48 AM  Result Value Ref Range   Hemoglobin A1C     HbA1c POC (<> result, manual entry)     HbA1c, POC (prediabetic range)     HbA1c, POC (controlled diabetic range) 5.6 0.0 - 7.0 %  POCT glucose (manual entry)   Collection Time: 10/12/23  9:48 AM  Result Value Ref Range   POC Glucose 119 (A) 70 - 99 mg/dl  Changed from Trulicity  to Ozempic  2 mths ago due to GI side effects from the former.  Reports tolerating the Ozempic  0.25 mg dose which was increased one mth ago by the clinical pharmacist to the 0.5 mg.  It has decreased her appetite a little. No significant wgh loss so far based on our scale today; down only 3 lbs. However she reports having loss 11 lbs by her scale at home -over due for DM eye exam.  No insurance  We have been keeping eye on B, DBP remains elev.  Diet control. She limits salt in foods.  Checks BP regularly at home.  Recent readings were: 127/90, 115/87, 124/86 Feels she has fluid build up below knees  Concern of L ear, itching, dryness X2 weeks.  No drainage  HM:  has FIT kit at home, needs PCV and shingles.  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    Major depression in remission (HCC) 06/16/2020   Stenosis of intracranial vessel 06/16/2020   Seizures (HCC) 03/23/2019   TIA (transient ischemic attack) 11/18/2018   Depression 02/21/2018   Essential hypertension 02/21/2018   Cerebrovascular accident (CVA) due to embolism of right middle cerebral artery (HCC) 02/21/2018   Type 2 diabetes mellitus with obesity (HCC) 12/20/2017   Right middle cerebral artery stroke (HCC) 12/19/2017   Left hemiparesis (HCC)    Dysphagia, oropharyngeal    Middle cerebral artery embolism, 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     Current Outpatient Medications on File Prior to Visit  Medication Sig Dispense Refill   acetaminophen  (TYLENOL ) 500 MG tablet Take 1,000 mg by mouth every 8 (eight) hours as needed for moderate pain or headache.     Blood Glucose Monitoring Suppl (TRUE METRIX METER) w/Device KIT Use 4 (four) times daily to check blood sugar 1 kit 0   Cholecalciferol (VITAMIN D3 PO) Take 1 tablet by mouth daily.     diclofenac  Sodium (VOLTAREN ) 1 % GEL Apply 2 g topically 2 (two) times daily as needed (apply to knees). 100 g 1   escitalopram  (LEXAPRO ) 20 MG tablet Take 1 tablet (20 mg total) by mouth daily. 90 tablet 1   ezetimibe  (ZETIA ) 10 MG tablet Take 1 tablet (10 mg total) by mouth daily. 90 tablet 3   glimepiride  (  AMARYL ) 1 MG tablet Take 1 tablet (1 mg total) by mouth daily with breakfast. 90 tablet 1   glucose blood (TRUE METRIX BLOOD GLUCOSE TEST) test strip Use as instructed 100 each 2   levETIRAcetam  (KEPPRA ) 500 MG tablet Take 1 tablet (500 mg total) by mouth 2 (two) times daily. 180 tablet 4   meclizine  (ANTIVERT ) 12.5 MG tablet Take 1 tablet (12.5 mg total) by mouth 2 (two) times daily as needed for dizziness. 100 tablet 3   pantoprazole  (PROTONIX ) 40 MG tablet Take 1 tablet (40 mg total) by mouth daily. 30 tablet 3   pravastatin  (PRAVACHOL ) 10 MG tablet Take 1 tablet (10 mg total) by mouth daily. 90 tablet 3   ticagrelor  (BRILINTA ) 90 MG TABS tablet Take 1  tablet (90 mg total) by mouth 2 (two) times daily. 60 tablet 3   traMADol  (ULTRAM ) 50 MG tablet Take by mouth every 6 (six) hours as needed.     traZODone  (DESYREL ) 50 MG tablet Take 0.5 tablets (25 mg total) by mouth Nightly. 45 tablet 0   triamcinolone  cream (KENALOG ) 0.1 % Apply 1 Application topically 2 (two) times daily as needed. 45 g 0   TRUEplus Lancets 28G MISC Use 4 times daily 100 each 3   No current facility-administered medications on file prior to visit.    No Known Allergies  Social History   Socioeconomic History   Marital status: Single    Spouse name: Not on file   Number of children: Not on file   Years of education: Not on file   Highest education level: Not on file  Occupational History   Not on file  Tobacco Use   Smoking status: Former    Current packs/day: 0.00    Average packs/day: 0.5 packs/day for 30.0 years (15.0 ttl pk-yrs)    Types: Cigarettes    Start date: 12/05/1987    Quit date: 12/04/2017    Years since quitting: 5.8   Smokeless tobacco: Never  Vaping Use   Vaping status: Never Used  Substance and Sexual Activity   Alcohol use: Not Currently    Comment: weekly   Drug use: No   Sexual activity: Not Currently    Birth control/protection: None  Other Topics Concern   Not on file  Social History Narrative   Not on file   Social Drivers of Health   Financial Resource Strain: Not on file  Food Insecurity: No Food Insecurity (08/10/2023)   Hunger Vital Sign    Worried About Running Out of Food in the Last Year: Never true    Ran Out of Food in the Last Year: Never true  Transportation Needs: No Transportation Needs (08/10/2023)   PRAPARE - Administrator, Civil Service (Medical): No    Lack of Transportation (Non-Medical): No  Physical Activity: Not on file  Stress: Not on file  Social Connections: Not on file  Intimate Partner Violence: Not At Risk (08/10/2023)   Humiliation, Afraid, Rape, and Kick questionnaire    Fear of  Current or Ex-Partner: No    Emotionally Abused: No    Physically Abused: No    Sexually Abused: No    Family History  Problem Relation Age of Onset   Diabetes Mother    Pulmonary disease Mother    Heart disease Father    Stroke Sister     Past Surgical History:  Procedure Laterality Date   BREAST EXCISIONAL BIOPSY     BREAST SURGERY  right breast   CHOLECYSTECTOMY     IR ANGIO INTRA EXTRACRAN SEL COM CAROTID INNOMINATE BILAT MOD SED  03/06/2018   IR ANGIO INTRA EXTRACRAN SEL COM CAROTID INNOMINATE BILAT MOD SED  11/20/2018   IR ANGIO INTRA EXTRACRAN SEL COM CAROTID INNOMINATE BILAT MOD SED  10/31/2019   IR ANGIO VERTEBRAL SEL VERTEBRAL BILAT MOD SED  03/06/2018   IR ANGIO VERTEBRAL SEL VERTEBRAL BILAT MOD SED  11/20/2018   IR ANGIO VERTEBRAL SEL VERTEBRAL BILAT MOD SED  10/31/2019   IR CT HEAD LTD  12/18/2017   IR INTRA CRAN STENT  12/18/2017   IR PERCUTANEOUS ART THROMBECTOMY/INFUSION INTRACRANIAL INC DIAG ANGIO  12/18/2017   IR RADIOLOGIST EVAL & MGMT  01/31/2018   IR US  GUIDE VASC ACCESS RIGHT  10/31/2019   RADIOLOGY WITH ANESTHESIA N/A 12/18/2017   Procedure: RADIOLOGY WITH ANESTHESIA;  Surgeon: Dolphus Carrion, MD;  Location: [facility] OR;  Service: Radiology;  Laterality: N/A;    ROS: Review of Systems Negative except as stated above  PHYSICAL EXAM: BP 120/84   Pulse 78   Temp 97.8 F (36.6 C) (Oral)   Ht 5' 7 (1.702 m)   Wt 253 lb (114.8 kg)   SpO2 97%   BMI 39.63 kg/m   Wt Readings from Last 3 Encounters:  10/12/23 253 lb (114.8 kg)  08/10/23 250 lb (113.4 kg)  07/03/23 255 lb (115.7 kg)  120/84  Physical Exam  General appearance - alert, well appearing, morbidly obese older Hispanic female and in no distress Mental status - normal mood, behavior, speech, dress, motor activity, and thought processes Ears - bilateral TM's and external ear canals normal Neck - supple, no significant adenopathy Chest - clear to auscultation, no wheezes, rales or rhonchi, symmetric  air entry Heart - normal rate, regular rhythm, normal S1, S2, no murmurs, rubs, clicks or gallops Extremities - peripheral pulses normal, no pedal edema, no clubbing or cyanosis.  Increase fatty tissue around medial knees Diabetic Foot Exam - Simple   Simple Foot Form Diabetic Foot exam was performed with the following findings: Yes 10/12/2023 10:28 AM  Visual Inspection No deformities, no ulcerations, no other skin breakdown bilaterally: Yes Sensation Testing Intact to touch and monofilament testing bilaterally: Yes Pulse Check Posterior Tibialis and Dorsalis pulse intact bilaterally: Yes Comments         Latest Ref Rng & Units 04/18/2023    8:17 PM 12/21/2022   10:15 AM 07/18/2022    6:35 PM  CMP  Glucose 70 - 99 mg/dL 884  857  85   BUN 8 - 23 mg/dL 15  11  11    Creatinine 0.44 - 1.00 mg/dL 9.37  9.45  9.46   Sodium 135 - 145 mmol/L 137  139  136   Potassium 3.5 - 5.1 mmol/L 4.2  5.0  4.1   Chloride 98 - 111 mmol/L 103  105  103   CO2 22 - 32 mmol/L 24  19  24    Calcium  8.9 - 10.3 mg/dL 9.5  9.4  9.4   Total Protein 6.5 - 8.1 g/dL 7.0  7.0  7.2   Total Bilirubin 0.3 - 1.2 mg/dL <9.8  0.5  0.4   Alkaline Phos 38 - 126 U/L 62  85  69   AST 15 - 41 U/L 22  23  25    ALT 0 - 44 U/L 19  14  18     Lipid Panel     Component Value Date/Time   CHOL  175 12/21/2022 1015   TRIG 105 12/21/2022 1015   HDL 52 12/21/2022 1015   CHOLHDL 3.4 12/21/2022 1015   CHOLHDL 2.5 03/24/2019 0611   VLDL 15 03/24/2019 0611   LDLCALC 104 (H) 12/21/2022 1015    CBC    Component Value Date/Time   WBC 8.8 04/18/2023 2017   RBC 4.29 04/18/2023 2017   HGB 13.1 04/18/2023 2017   HGB 14.1 06/16/2020 1140   HCT 40.2 04/18/2023 2017   HCT 43.6 06/16/2020 1140   PLT 286 04/18/2023 2017   PLT 273 06/16/2020 1140   MCV 93.7 04/18/2023 2017   MCV 91 06/16/2020 1140   MCH 30.5 04/18/2023 2017   MCHC 32.6 04/18/2023 2017   RDW 13.2 04/18/2023 2017   RDW 12.4 06/16/2020 1140   LYMPHSABS 2.6  04/18/2023 2017   MONOABS 0.6 04/18/2023 2017   EOSABS 0.1 04/18/2023 2017   BASOSABS 0.0 04/18/2023 2017    ASSESSMENT AND PLAN: 1. Type 2 diabetes mellitus with obesity (HCC) (Primary) A1c is at goal.  She is not had any significant weight loss as yet on the Ozempic .  She has 1 more dose of the 0.5 mg pen.  She is agreeable to us  increasing the dose to 1 mg once a week after that.  Went over side effects of the medication again.  She should stop the medicine if she develops any vomiting, abdominal pain, severe diarrhea.  Encourage healthy eating habits - POCT glycosylated hemoglobin (Hb A1C) - POCT glucose (manual entry) - Semaglutide , 1 MG/DOSE, 4 MG/3ML SOPN; Inject 1 mg as directed once a week. Start after completing the 0.5 mg dose  Dispense: 3 mL; Refill: 1  2. Long-term (current) use of injectable non-insulin  antidiabetic drugs See #1 above  3. Hypertension associated with type 2 diabetes mellitus (HCC) Diastolic remains persistently above goal.  Start low dose lisinopril  - lisinopril  (ZESTRIL ) 2.5 MG tablet; Take 1 tablet (2.5 mg total) by mouth daily.  Dispense: 30 tablet; Refill: 3  4. Itching of ear Advised use of hydrocortisone cream over-the-counter as needed.  Can put a small amount on a Q-tip and rub it at the entrance of the ear when she has itching.  5. Screening for colon cancer Encouraged her to use the fit test and return it to us  as soon as possible for colon cancer screening.  6. Need for vaccination against Streptococcus pneumoniae Given PCV 20    Patient was given the opportunity to ask questions.  Patient verbalized understanding of the plan and was able to repeat key elements of the plan.   This documentation was completed using Paediatric nurse.  Any transcriptional errors are unintentional.  Orders Placed This Encounter  Procedures   Pneumococcal conjugate vaccine 20-valent   POCT glycosylated hemoglobin (Hb A1C)   POCT glucose  (manual entry)     Requested Prescriptions   Signed Prescriptions Disp Refills   furosemide  (LASIX ) 20 MG tablet 30 tablet 0    Sig: Take 1 tablet (20 mg total) by mouth daily as needed for fluid.   Semaglutide , 1 MG/DOSE, 4 MG/3ML SOPN 3 mL 1    Sig: Inject 1 mg as directed once a week. Start after completing the 0.5 mg dose   lisinopril  (ZESTRIL ) 2.5 MG tablet 30 tablet 3    Sig: Take 1 tablet (2.5 mg total) by mouth daily.    Return in about 4 months (around 02/09/2024).  Barnie Louder, MD, FACP

## 2023-10-12 NOTE — Patient Instructions (Signed)
 Increased dose of Ozempic  to 1 mg once a week after you complete your last pen of the 0.5 mg.  We have started you on a low-dose of a blood pressure medicine called lisinopril  2.5 mg once a day.  You can purchase some hydrocortisone cream over-the-counter and use a little on a Q-tip at the opening to the left ear canal as needed for itching.

## 2023-10-13 ENCOUNTER — Other Ambulatory Visit: Payer: Self-pay

## 2023-10-31 ENCOUNTER — Other Ambulatory Visit: Payer: Self-pay | Admitting: Internal Medicine

## 2023-10-31 DIAGNOSIS — E1169 Type 2 diabetes mellitus with other specified complication: Secondary | ICD-10-CM

## 2023-10-31 DIAGNOSIS — E1159 Type 2 diabetes mellitus with other circulatory complications: Secondary | ICD-10-CM

## 2023-10-31 NOTE — Telephone Encounter (Unsigned)
Copied from CRM [id number]. Topic: Clinical - Medication Refill >> Oct 31, 2023  9:34 AM Prudencio Pair wrote: Most Recent Primary Care Visit:  Provider: Jonah Blue B  Department: [facility]  Visit Type: OFFICE VISIT  Date: 10/12/2023  Medication: lisinopril (ZESTRIL) 2.5 MG tablet & Semaglutide, 1 MG/DOSE, 4 MG/3ML SOPN  Has the patient contacted their pharmacy? No, did advise pt to always check with pharmacy first (Agent: If no, request that the patient contact the pharmacy for the refill. If patient does not wish to contact the pharmacy document the reason why and proceed with request.) (Agent: If yes, when and what did the pharmacy advise?)  Is this the correct pharmacy for this prescription? Yes If no, delete pharmacy and type the correct one.  This is the patient's preferred pharmacy:  [facility] [address], [address] [place] [address] Phone: [phone] Fax: [phone]   Has the prescription been filled recently? Yes  Is the patient out of the medication? N/A  Has the patient been seen for an appointment in the last year OR does the patient have an upcoming appointment? Yes  Can we respond through MyChart? Yes  Agent: Please be advised that Rx refills may take up to 3 business days. We ask that you follow-up with your pharmacy.

## 2023-11-01 ENCOUNTER — Other Ambulatory Visit: Payer: Self-pay

## 2023-11-01 MED ORDER — LISINOPRIL 2.5 MG PO TABS
2.5000 mg | ORAL_TABLET | Freq: Every day | ORAL | 3 refills | Status: DC
  Filled 2023-11-01 – 2023-11-06 (×2): qty 90, 90d supply, fill #0
  Filled 2023-12-26 – 2024-02-08 (×2): qty 90, 90d supply, fill #1
  Filled 2024-03-20 – 2024-05-15 (×2): qty 90, 90d supply, fill #2
  Filled 2024-06-19: qty 90, 90d supply, fill #3

## 2023-11-01 MED ORDER — SEMAGLUTIDE (1 MG/DOSE) 4 MG/3ML ~~LOC~~ SOPN
1.0000 mg | PEN_INJECTOR | SUBCUTANEOUS | 1 refills | Status: DC
  Filled 2023-11-01 – 2023-11-04 (×2): qty 3, 28d supply, fill #0
  Filled 2023-12-03 – 2023-12-04 (×2): qty 3, 28d supply, fill #1

## 2023-11-04 ENCOUNTER — Other Ambulatory Visit (HOSPITAL_BASED_OUTPATIENT_CLINIC_OR_DEPARTMENT_OTHER): Payer: Self-pay | Admitting: Internal Medicine

## 2023-11-06 ENCOUNTER — Other Ambulatory Visit: Payer: Self-pay

## 2023-11-07 ENCOUNTER — Other Ambulatory Visit: Payer: Self-pay

## 2023-11-07 MED ORDER — EZETIMIBE 10 MG PO TABS
10.0000 mg | ORAL_TABLET | Freq: Every day | ORAL | 1 refills | Status: DC
  Filled 2023-11-07 – 2023-11-29 (×2): qty 30, 30d supply, fill #0
  Filled 2023-12-26 – 2024-01-01 (×3): qty 30, 30d supply, fill #1

## 2023-11-16 ENCOUNTER — Other Ambulatory Visit: Payer: Self-pay

## 2023-11-22 ENCOUNTER — Other Ambulatory Visit: Payer: Self-pay

## 2023-11-27 ENCOUNTER — Other Ambulatory Visit: Payer: Self-pay

## 2023-11-29 ENCOUNTER — Other Ambulatory Visit (HOSPITAL_BASED_OUTPATIENT_CLINIC_OR_DEPARTMENT_OTHER): Payer: Self-pay | Admitting: Internal Medicine

## 2023-11-29 ENCOUNTER — Other Ambulatory Visit: Payer: Self-pay | Admitting: Emergency Medicine

## 2023-11-30 ENCOUNTER — Other Ambulatory Visit: Payer: Self-pay

## 2023-11-30 MED ORDER — PRAVASTATIN SODIUM 10 MG PO TABS
10.0000 mg | ORAL_TABLET | Freq: Every day | ORAL | 0 refills | Status: DC
  Filled 2023-11-30: qty 30, 30d supply, fill #0

## 2023-12-01 ENCOUNTER — Telehealth: Payer: Self-pay

## 2023-12-01 NOTE — Telephone Encounter (Signed)
 Copied from CRM [id number]. Topic: Clinical - Medical Advice >> Nov 30, 2023  1:47 PM Fonda Kinder J wrote: Reason for CRM: PT called in to request approval for shingles vaccine

## 2023-12-04 ENCOUNTER — Other Ambulatory Visit: Payer: Self-pay

## 2023-12-05 ENCOUNTER — Ambulatory Visit: Payer: Self-pay | Attending: Family Medicine

## 2023-12-05 ENCOUNTER — Other Ambulatory Visit: Payer: Self-pay

## 2023-12-05 DIAGNOSIS — Z23 Encounter for immunization: Secondary | ICD-10-CM

## 2023-12-05 NOTE — Progress Notes (Signed)
Shingrix vaccine administered per protocols.  Information sheet given. Patient denies and pain or discomfort at injection site. Tolerated injection well no reaction.

## 2023-12-06 NOTE — Telephone Encounter (Signed)
 Received 1st shingles vaccine yesterday.

## 2023-12-25 ENCOUNTER — Other Ambulatory Visit: Payer: Self-pay

## 2023-12-26 ENCOUNTER — Other Ambulatory Visit: Payer: Self-pay | Admitting: Internal Medicine

## 2023-12-26 ENCOUNTER — Other Ambulatory Visit: Payer: Self-pay | Admitting: Emergency Medicine

## 2023-12-26 ENCOUNTER — Other Ambulatory Visit: Payer: Self-pay

## 2023-12-26 ENCOUNTER — Other Ambulatory Visit (HOSPITAL_BASED_OUTPATIENT_CLINIC_OR_DEPARTMENT_OTHER): Payer: Self-pay | Admitting: Internal Medicine

## 2023-12-26 ENCOUNTER — Other Ambulatory Visit: Payer: Self-pay | Admitting: Physician Assistant

## 2023-12-26 DIAGNOSIS — G8929 Other chronic pain: Secondary | ICD-10-CM

## 2023-12-26 DIAGNOSIS — E1169 Type 2 diabetes mellitus with other specified complication: Secondary | ICD-10-CM

## 2023-12-26 DIAGNOSIS — R42 Dizziness and giddiness: Secondary | ICD-10-CM

## 2023-12-26 NOTE — Telephone Encounter (Signed)
 Requested medication (s) are due for refill today: Yes  Requested medication (s) are on the active medication list: Yes  Last refill:  12/21/22  Future visit scheduled: Yes  Notes to clinic:  Unable to refill per protocol, cannot delegate.      Requested Prescriptions  Pending Prescriptions Disp Refills   meclizine (ANTIVERT) 12.5 MG tablet 100 tablet 3    Sig: Take 1 tablet (12.5 mg total) by mouth 2 (two) times daily as needed for dizziness.     Not Delegated - Gastroenterology: Antiemetics Failed - 12/26/2023  3:47 PM      Failed - This refill cannot be delegated      Passed - Valid encounter within last 6 months    Recent Outpatient Visits           2 months ago Type 2 diabetes mellitus with obesity (HCC)   [facility] [place]. [facility] Marcine Matar, MD   3 months ago Essential hypertension   [place] Of [place]. [facility] Lois Huxley, [place] L, RPH-CPP   4 months ago Hematoma of buttock   [facility] [facility] Of [place]. [facility] Jonah Blue B, MD   5 months ago Type 2 diabetes mellitus with obesity [facility])   [place] [facility] - A Dept Of . [facility] Marcine Matar, MD   10 months ago No-show for appointment   [facility] - A Dept Of [place] [facility]. [facility] Marcine Matar, MD       Future Appointments             In 1 month Laural Benes Binnie Rail, MD [facility] - A Dept Of [place] [facility]. [facility]

## 2023-12-27 ENCOUNTER — Other Ambulatory Visit: Payer: Self-pay

## 2023-12-27 MED ORDER — OZEMPIC (1 MG/DOSE) 4 MG/3ML ~~LOC~~ SOPN
1.0000 mg | PEN_INJECTOR | SUBCUTANEOUS | 1 refills | Status: DC
  Filled 2023-12-27 – 2023-12-31 (×2): qty 3, 28d supply, fill #0
  Filled 2024-02-08: qty 3, 28d supply, fill #1

## 2023-12-27 MED ORDER — TICAGRELOR 90 MG PO TABS
90.0000 mg | ORAL_TABLET | Freq: Two times a day (BID) | ORAL | 1 refills | Status: DC
  Filled 2023-12-27 – 2023-12-31 (×2): qty 180, 90d supply, fill #0
  Filled 2024-01-01: qty 60, 30d supply, fill #0
  Filled 2024-01-01 – 2024-02-08 (×2): qty 180, 90d supply, fill #0
  Filled 2024-03-20: qty 60, 30d supply, fill #0
  Filled 2024-03-20: qty 180, 90d supply, fill #0

## 2023-12-27 MED ORDER — MECLIZINE HCL 12.5 MG PO TABS
12.5000 mg | ORAL_TABLET | Freq: Two times a day (BID) | ORAL | 0 refills | Status: DC | PRN
  Filled 2023-12-27: qty 100, 50d supply, fill #0
  Filled 2024-03-20: qty 80, 40d supply, fill #1

## 2023-12-27 MED ORDER — DICLOFENAC SODIUM 1 % EX GEL
2.0000 g | Freq: Two times a day (BID) | CUTANEOUS | 1 refills | Status: AC | PRN
  Filled 2023-12-27: qty 100, 12d supply, fill #0
  Filled 2024-03-20: qty 100, 12d supply, fill #1

## 2023-12-28 ENCOUNTER — Other Ambulatory Visit: Payer: Self-pay

## 2023-12-28 MED ORDER — PRAVASTATIN SODIUM 10 MG PO TABS
10.0000 mg | ORAL_TABLET | Freq: Every day | ORAL | 0 refills | Status: DC
  Filled 2023-12-28 – 2023-12-31 (×2): qty 15, 15d supply, fill #0

## 2023-12-31 ENCOUNTER — Other Ambulatory Visit: Payer: Self-pay | Admitting: Emergency Medicine

## 2024-01-01 ENCOUNTER — Other Ambulatory Visit: Payer: Self-pay

## 2024-01-02 ENCOUNTER — Other Ambulatory Visit: Payer: Self-pay

## 2024-01-10 ENCOUNTER — Other Ambulatory Visit: Payer: Self-pay

## 2024-01-12 ENCOUNTER — Other Ambulatory Visit: Payer: Self-pay

## 2024-01-23 ENCOUNTER — Other Ambulatory Visit: Payer: Self-pay

## 2024-02-08 ENCOUNTER — Other Ambulatory Visit: Payer: Self-pay

## 2024-02-08 ENCOUNTER — Other Ambulatory Visit (HOSPITAL_BASED_OUTPATIENT_CLINIC_OR_DEPARTMENT_OTHER): Payer: Self-pay | Admitting: Internal Medicine

## 2024-02-08 ENCOUNTER — Other Ambulatory Visit: Payer: Self-pay | Admitting: Internal Medicine

## 2024-02-08 ENCOUNTER — Other Ambulatory Visit: Payer: Self-pay | Admitting: Emergency Medicine

## 2024-02-08 DIAGNOSIS — F330 Major depressive disorder, recurrent, mild: Secondary | ICD-10-CM

## 2024-02-09 ENCOUNTER — Other Ambulatory Visit: Payer: Self-pay

## 2024-02-09 ENCOUNTER — Other Ambulatory Visit: Payer: Self-pay | Admitting: Pharmacist

## 2024-02-09 ENCOUNTER — Other Ambulatory Visit (HOSPITAL_COMMUNITY): Payer: Self-pay

## 2024-02-09 ENCOUNTER — Encounter: Payer: Self-pay | Admitting: Internal Medicine

## 2024-02-09 ENCOUNTER — Ambulatory Visit: Payer: Self-pay | Attending: Internal Medicine | Admitting: Internal Medicine

## 2024-02-09 VITALS — BP 112/77 | HR 76 | Ht 67.0 in | Wt 246.0 lb

## 2024-02-09 DIAGNOSIS — F330 Major depressive disorder, recurrent, mild: Secondary | ICD-10-CM | POA: Insufficient documentation

## 2024-02-09 DIAGNOSIS — Z1211 Encounter for screening for malignant neoplasm of colon: Secondary | ICD-10-CM

## 2024-02-09 DIAGNOSIS — E119 Type 2 diabetes mellitus without complications: Secondary | ICD-10-CM

## 2024-02-09 DIAGNOSIS — Z7985 Long-term (current) use of injectable non-insulin antidiabetic drugs: Secondary | ICD-10-CM

## 2024-02-09 DIAGNOSIS — E0789 Other specified disorders of thyroid: Secondary | ICD-10-CM

## 2024-02-09 DIAGNOSIS — F32A Depression, unspecified: Secondary | ICD-10-CM

## 2024-02-09 DIAGNOSIS — F3340 Major depressive disorder, recurrent, in remission, unspecified: Secondary | ICD-10-CM

## 2024-02-09 DIAGNOSIS — E669 Obesity, unspecified: Secondary | ICD-10-CM

## 2024-02-09 DIAGNOSIS — Z6838 Body mass index (BMI) 38.0-38.9, adult: Secondary | ICD-10-CM

## 2024-02-09 DIAGNOSIS — E1169 Type 2 diabetes mellitus with other specified complication: Secondary | ICD-10-CM

## 2024-02-09 DIAGNOSIS — E1159 Type 2 diabetes mellitus with other circulatory complications: Secondary | ICD-10-CM

## 2024-02-09 DIAGNOSIS — I10 Essential (primary) hypertension: Secondary | ICD-10-CM

## 2024-02-09 MED ORDER — SEMAGLUTIDE (1 MG/DOSE) 4 MG/3ML ~~LOC~~ SOPN
1.0000 mg | PEN_INJECTOR | SUBCUTANEOUS | 1 refills | Status: DC
Start: 2024-02-09 — End: 2024-02-29
  Filled 2024-02-09: qty 3, 28d supply, fill #0

## 2024-02-09 MED ORDER — TRAZODONE HCL 50 MG PO TABS
25.0000 mg | ORAL_TABLET | Freq: Every evening | ORAL | 1 refills | Status: DC
  Filled 2024-02-09: qty 45, 90d supply, fill #0
  Filled 2024-03-20 – 2024-05-15 (×2): qty 45, 90d supply, fill #1

## 2024-02-09 MED ORDER — ESCITALOPRAM OXALATE 20 MG PO TABS
20.0000 mg | ORAL_TABLET | Freq: Every day | ORAL | 1 refills | Status: DC
  Filled 2024-02-09: qty 90, 90d supply, fill #0
  Filled 2024-03-20 – 2024-05-15 (×3): qty 90, 90d supply, fill #1

## 2024-02-09 MED ORDER — SEMAGLUTIDE (2 MG/DOSE) 8 MG/3ML ~~LOC~~ SOPN
2.0000 mg | PEN_INJECTOR | SUBCUTANEOUS | 3 refills | Status: DC
  Filled 2024-02-09: qty 3, fill #0
  Filled 2024-02-29: qty 3, 28d supply, fill #0
  Filled 2024-03-20 – 2024-04-24 (×2): qty 3, 28d supply, fill #1
  Filled 2024-06-19 – 2024-06-20 (×2): qty 3, 28d supply, fill #2

## 2024-02-09 MED ORDER — EZETIMIBE 10 MG PO TABS
10.0000 mg | ORAL_TABLET | Freq: Every day | ORAL | 0 refills | Status: DC
  Filled 2024-02-09: qty 15, 15d supply, fill #0

## 2024-02-09 MED ORDER — PRAVASTATIN SODIUM 10 MG PO TABS
10.0000 mg | ORAL_TABLET | Freq: Every day | ORAL | 0 refills | Status: DC
  Filled 2024-02-09: qty 15, 15d supply, fill #0

## 2024-02-09 NOTE — Progress Notes (Signed)
 Patient ID: Heather [name], female    DOB: [birth year]  MRN: [id number]  CC: Medical Management of Chronic Issues (Low BP reading at home/Dry cough at night)   Subjective: Heather [name] is a 68 y.o. female who presents for chronic ds management. Her concerns today include:  Hx of DM, HTN, HL,former smoker tob dep, CVA RT MCA territory, s/p thrombectomy followed by stenting of M2 by interventional radiology, sz disorder, depression  Discussed the use of AI scribe software for clinical note transcription with the patient, who gave verbal consent to proceed.  [employer] Language interpreter used during this encounter. [phone]  Reports compliance with Lisinopril  2.5 mg daily.  Checks BP a few times a wk.  Reports some lows last wk of 97/66, 96/67; no dizziness CVA/HTN:  Should be on Pravachol  10 mg daily (takes it 3x/wk because daily use causes muscle aches), Zetia , Brinlinta BID.  She reports taking these medications.  DM:  Lab Results  Component Value Date   HGBA1C 5.6 10/12/2023  On last visit with me, we change her from Trulicity  to Ozempic .  Still taking Amaryl . Reports she is tolerating the Ozempic .  It has decreased her appetite but admits to over eating on a recent trip to [place] . Staying away from sweet drinks, no bread/flour. Eats chicken, veggie; not too much red meat.   Walks 20-30 5-days a wk and uses foot pedals when sitting.   Weight is down only 4 pounds since I last saw her several months ago Over due for eye exam. No insurance.  Request to have thyroid  level checked.  Occasionally gets pain over the thyroid  gland.  Wants to know how long she can hold Brilinita if she needs to have screws taken out of her upper jaw which are holding her front teeth in place  MDD:  still taking Lexapro  and feels depression in remission.   HM:  had mailed in FIT kit which we never received. 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/2020   Major depression in remission (HCC) 06/16/2020   Stenosis of intracranial vessel 06/16/2020   Seizures (HCC) 03/23/2019   TIA (transient ischemic attack) 11/18/2018   Depression 02/21/2018   Essential hypertension 02/21/2018   Cerebrovascular accident (CVA) due to embolism of right middle cerebral artery (HCC) 02/21/2018   Type 2 diabetes mellitus with obesity (HCC) 12/20/2017   Right middle cerebral artery stroke (HCC) 12/19/2017   Left hemiparesis (HCC)    Dysphagia, oropharyngeal    Middle cerebral artery embolism, 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     Current Outpatient Medications on File Prior to Visit  Medication Sig Dispense Refill   acetaminophen  (TYLENOL ) 500 MG tablet Take 1,000 mg by mouth every 8 (eight) hours as needed for moderate pain or headache.     Blood Glucose Monitoring Suppl (TRUE METRIX METER) w/Device KIT Use 4 (four) times daily to check blood sugar 1 kit 0   Cholecalciferol (VITAMIN D3 PO) Take 1 tablet by mouth daily.     diclofenac  Sodium (VOLTAREN ) 1 % GEL Apply 2 g topically 2 (two) times daily as needed (apply to knees). 100 g 1   escitalopram  (LEXAPRO ) 20 MG tablet Take 1 tablet (20 mg total) by mouth daily. 90 tablet 1   ezetimibe  (ZETIA ) 10 MG tablet Take 1 tablet (10 mg total) by mouth daily.Pt must schedule an overdue followup appt with Cardiology for any more refills. [phone] 3rd and FINAL  attempt Thank  You 15 tablet 0   furosemide  (LASIX ) 20 MG tablet Take 1 tablet (20 mg total) by mouth daily as needed for fluid. 30 tablet 0   glimepiride  (AMARYL ) 1 MG tablet Take 1 tablet (1 mg total) by mouth daily with breakfast. 90 tablet 1   glucose blood (TRUE METRIX BLOOD GLUCOSE TEST) test strip Use as instructed 100 each 2   levETIRAcetam  (KEPPRA ) 500 MG tablet Take 1 tablet (500 mg total) by mouth 2 (two) times daily. 180 tablet 4   lisinopril  (ZESTRIL ) 2.5 MG tablet Take 1 tablet (2.5 mg total) by mouth daily. 90 tablet 3   meclizine  (ANTIVERT ) 12.5 MG  tablet Take 1 tablet (12.5 mg total) by mouth 2 (two) times daily as needed for dizziness. 180 tablet 0   pantoprazole  (PROTONIX ) 40 MG tablet Take 1 tablet (40 mg total) by mouth daily. 30 tablet 3   pravastatin  (PRAVACHOL ) 10 MG tablet Take 1 tablet (10 mg total) by mouth daily.Pt must schedule an overdue followup appt with Cardiology for any more refills. [phone] 3rd and FINAL  attempt Thank You 15 tablet 0   Semaglutide , 1 MG/DOSE, (OZEMPIC , 1 MG/DOSE,) 4 MG/3ML SOPN Inject 1 mg as directed once a week. 3 mL 1   ticagrelor  (BRILINTA ) 90 MG TABS tablet Take 1 tablet (90 mg total) by mouth 2 (two) times daily. 180 tablet 1   traMADol  (ULTRAM ) 50 MG tablet Take by mouth every 6 (six) hours as needed.     traZODone  (DESYREL ) 50 MG tablet Take 0.5 tablets (25 mg total) by mouth Nightly. 45 tablet 0   triamcinolone  cream (KENALOG ) 0.1 % Apply 1 Application topically 2 (two) times daily as needed. 45 g 0   TRUEplus Lancets 28G MISC Use 4 times daily 100 each 3   No current facility-administered medications on file prior to visit.    No Known Allergies  Social History   Socioeconomic History   Marital status: Single    Spouse name: Not on file   Number of children: Not on file   Years of education: Not on file   Highest education level: Not on file  Occupational History   Not on file  Tobacco Use   Smoking status: Former    Current packs/day: 0.00    Average packs/day: 0.5 packs/day for 30.0 years (15.0 ttl pk-yrs)    Types: Cigarettes    Start date: 12/05/1987    Quit date: 12/04/2017    Years since quitting: 6.1   Smokeless tobacco: Never  Vaping Use   Vaping status: Never Used  Substance and Sexual Activity   Alcohol use: Not Currently    Comment: weekly   Drug use: No   Sexual activity: Not Currently    Birth control/protection: None  Other Topics Concern   Not on file  Social History Narrative   Not on file   Social Drivers of Health   Financial Resource Strain: Not  on file  Food Insecurity: No Food Insecurity (08/10/2023)   Hunger Vital Sign    Worried About Running Out of Food in the Last Year: Never true    Ran Out of Food in the Last Year: Never true  Transportation Needs: No Transportation Needs (08/10/2023)   PRAPARE - Administrator, Civil Service (Medical): No    Lack of Transportation (Non-Medical): No  Physical Activity: Not on file  Stress: Not on file  Social Connections: Not on file  Intimate Partner Violence: Not At Risk (08/10/2023)  Humiliation, Afraid, Rape, and Kick questionnaire    Fear of Current or Ex-Partner: No    Emotionally Abused: No    Physically Abused: No    Sexually Abused: No    Family History  Problem Relation Age of Onset   Diabetes Mother    Pulmonary disease Mother    Heart disease Father    Stroke Sister     Past Surgical History:  Procedure Laterality Date   BREAST EXCISIONAL BIOPSY     BREAST SURGERY     right breast   CHOLECYSTECTOMY     IR ANGIO INTRA EXTRACRAN SEL COM CAROTID INNOMINATE BILAT MOD SED  03/06/2018   IR ANGIO INTRA EXTRACRAN SEL COM CAROTID INNOMINATE BILAT MOD SED  11/20/2018   IR ANGIO INTRA EXTRACRAN SEL COM CAROTID INNOMINATE BILAT MOD SED  10/31/2019   IR ANGIO VERTEBRAL SEL VERTEBRAL BILAT MOD SED  03/06/2018   IR ANGIO VERTEBRAL SEL VERTEBRAL BILAT MOD SED  11/20/2018   IR ANGIO VERTEBRAL SEL VERTEBRAL BILAT MOD SED  10/31/2019   IR CT HEAD LTD  12/18/2017   IR INTRA CRAN STENT  12/18/2017   IR PERCUTANEOUS ART THROMBECTOMY/INFUSION INTRACRANIAL INC DIAG ANGIO  12/18/2017   IR RADIOLOGIST EVAL & MGMT  01/31/2018   IR US  GUIDE VASC ACCESS RIGHT  10/31/2019   RADIOLOGY WITH ANESTHESIA N/A 12/18/2017   Procedure: RADIOLOGY WITH ANESTHESIA;  Surgeon: Luellen Sages, MD;  Location: [facility] OR;  Service: Radiology;  Laterality: N/A;    ROS: Review of Systems Negative except as stated above  PHYSICAL EXAM: BP 112/77   Pulse 76   Ht 5\' 7"  (1.702 m)   Wt 246 lb (111.6 kg)    SpO2 97%   BMI 38.53 kg/m   Wt Readings from Last 3 Encounters:  02/09/24 246 lb (111.6 kg)  10/12/23 253 lb (114.8 kg)  08/10/23 250 lb (113.4 kg)    Physical Exam  General appearance - alert, well appearing, and in no distress Mental status - normal mood, behavior, speech, dress, motor activity, and thought processes Neck - supple, no significant adenopathy.  No thyromegaly or nodules appreciated. Chest - clear to auscultation, no wheezes, rales or rhonchi, symmetric air entry Heart - normal rate, regular rhythm, normal S1, S2, no murmurs, rubs, clicks or gallops Extremities - peripheral pulses normal, no pedal edema, no clubbing or cyanosis      Latest Ref Rng & Units 04/18/2023    8:17 PM 12/21/2022   10:15 AM 07/18/2022    6:35 PM  CMP  Glucose 70 - 99 mg/dL 469  629  85   BUN 8 - 23 mg/dL 15  11  11    Creatinine 0.44 - 1.00 mg/dL 5.28  4.13  2.44   Sodium 135 - 145 mmol/L 137  139  136   Potassium 3.5 - 5.1 mmol/L 4.2  5.0  4.1   Chloride 98 - 111 mmol/L 103  105  103   CO2 22 - 32 mmol/L 24  19  24    Calcium  8.9 - 10.3 mg/dL 9.5  9.4  9.4   Total Protein 6.5 - 8.1 g/dL 7.0  7.0  7.2   Total Bilirubin 0.3 - 1.2 mg/dL <0.1  0.5  0.4   Alkaline Phos 38 - 126 U/L 62  85  69   AST 15 - 41 U/L 22  23  25    ALT 0 - 44 U/L 19  14  18     Lipid Panel  Component Value Date/Time   CHOL 175 12/21/2022 1015   TRIG 105 12/21/2022 1015   HDL 52 12/21/2022 1015   CHOLHDL 3.4 12/21/2022 1015   CHOLHDL 2.5 03/24/2019 0611   VLDL 15 03/24/2019 0611   LDLCALC 104 (H) 12/21/2022 1015    CBC    Component Value Date/Time   WBC 8.8 04/18/2023 2017   RBC 4.29 04/18/2023 2017   HGB 13.1 04/18/2023 2017   HGB 14.1 06/16/2020 1140   HCT 40.2 04/18/2023 2017   HCT 43.6 06/16/2020 1140   PLT 286 04/18/2023 2017   PLT 273 06/16/2020 1140   MCV 93.7 04/18/2023 2017   MCV 91 06/16/2020 1140   MCH 30.5 04/18/2023 2017   MCHC 32.6 04/18/2023 2017   RDW 13.2 04/18/2023 2017    RDW 12.4 06/16/2020 1140   LYMPHSABS 2.6 04/18/2023 2017   MONOABS 0.6 04/18/2023 2017   EOSABS 0.1 04/18/2023 2017   BASOSABS 0.0 04/18/2023 2017    ASSESSMENT AND PLAN: 1. Type 2 diabetes mellitus with obesity (HCC) (Primary) A1c is at goal.  Discussed and encouraged healthy eating habits.  Continue regular exercise.  She has not achieved much in terms of weight loss since starting Ozempic .  She is tolerating the medication nonetheless.  We discussed increasing the dose to 2 mg once a week once she has completed which she has left of the 1 mg dose. - Semaglutide , 2 MG/DOSE, 8 MG/3ML SOPN; Inject 2 mg as directed once a week. Start once she completes the 1 mg dose that she has  Dispense: 3 mL; Refill: 3 - CBC - Comprehensive metabolic panel with GFR  2. Long-term (current) use of injectable non-insulin  antidiabetic drugs See #1 above.  3. Hypertension associated with type 2 diabetes mellitus (HCC) Continue low-dose lisinopril  2.5 mg daily.  4. Depressive disorder in remission Stable on Lexapro  - escitalopram  (LEXAPRO ) 20 MG tablet; Take 1 tablet (20 mg total) by mouth daily.  Dispense: 90 tablet; Refill: 1  5. Screening for colon cancer - Fecal occult blood, imunochemical([facility])  6. Thyroid  pain - TSH+T4F+T3Free    Patient was given the opportunity to ask questions.  Patient verbalized understanding of the plan and was able to repeat key elements of the plan.   This documentation was completed using Paediatric nurse.  Any transcriptional errors are unintentional.  No orders of the defined types were placed in this encounter.    Requested Prescriptions    No prescriptions requested or ordered in this encounter    No follow-ups on file.  Concetta Dee, MD, FACP

## 2024-02-09 NOTE — Patient Instructions (Signed)
 STOP Glimipremide.

## 2024-02-10 LAB — COMPREHENSIVE METABOLIC PANEL WITH GFR
ALT: 16 IU/L (ref 0–32)
AST: 16 IU/L (ref 0–40)
Albumin: 4 g/dL (ref 3.9–4.9)
Alkaline Phosphatase: 80 IU/L (ref 44–121)
BUN/Creatinine Ratio: 24 (ref 12–28)
BUN: 13 mg/dL (ref 8–27)
Bilirubin Total: 0.4 mg/dL (ref 0.0–1.2)
CO2: 23 mmol/L (ref 20–29)
Calcium: 9.7 mg/dL (ref 8.7–10.3)
Chloride: 104 mmol/L (ref 96–106)
Creatinine, Ser: 0.54 mg/dL — ABNORMAL LOW (ref 0.57–1.00)
Globulin, Total: 2.5 g/dL (ref 1.5–4.5)
Glucose: 119 mg/dL — ABNORMAL HIGH (ref 70–99)
Potassium: 4.7 mmol/L (ref 3.5–5.2)
Sodium: 140 mmol/L (ref 134–144)
Total Protein: 6.5 g/dL (ref 6.0–8.5)
eGFR: 100 mL/min/{1.73_m2} (ref >59)

## 2024-02-10 LAB — CBC
Hematocrit: 44.2 % (ref 34.0–46.6)
Hemoglobin: 14 g/dL (ref 11.1–15.9)
MCH: 31 pg (ref 26.6–33.0)
MCHC: 31.7 g/dL (ref 31.5–35.7)
MCV: 98 fL — ABNORMAL HIGH (ref 79–97)
Platelets: 287 10*3/uL (ref 150–450)
RBC: 4.52 x10E6/uL (ref 3.77–5.28)
RDW: 12.2 % (ref 11.7–15.4)
WBC: 6.7 10*3/uL (ref 3.4–10.8)

## 2024-02-10 LAB — TSH+T4F+T3FREE
Free T4: 1.17 ng/dL (ref 0.82–1.77)
T3, Free: 3 pg/mL (ref 2.0–4.4)
TSH: 0.737 u[IU]/mL (ref 0.450–4.500)

## 2024-02-12 ENCOUNTER — Other Ambulatory Visit: Payer: Self-pay

## 2024-02-14 ENCOUNTER — Ambulatory Visit: Payer: Self-pay

## 2024-02-14 ENCOUNTER — Other Ambulatory Visit: Payer: Self-pay

## 2024-02-15 ENCOUNTER — Other Ambulatory Visit: Payer: Self-pay

## 2024-02-15 ENCOUNTER — Telehealth: Payer: Self-pay

## 2024-02-15 NOTE — Telephone Encounter (Signed)
 Received notification from [employer] regarding approval for OZEMPIC . Patient assistance approved from 02/08/2024 to 02/07/2025.  Medication will ship to [address], [place], [place] [address]  Pt ID: [id number]  Company phone: [phone]

## 2024-02-16 ENCOUNTER — Other Ambulatory Visit: Payer: Self-pay

## 2024-02-16 ENCOUNTER — Ambulatory Visit: Payer: Self-pay

## 2024-02-16 NOTE — Telephone Encounter (Signed)
 Copied from CRM [id number]. Topic: Clinical - Lab/Test Results >> Feb 16, 2024 11:47 AM Heather [name] wrote: Reason for CRM: pt called in about lab results, stated she missed a call.   Read as follows :  "Blood cell counts, kidney and liver function tests and thyroid  level are all normal."  Interpreter: Heather [name] [phone]   Chief Complaint: Information Only  Disposition: [] ED /[] Urgent Care (no appt availability in office) / [] Appointment(In office/virtual)/ []  [place] [facility]/ [] Home Care/ [] Refused Recommended Disposition /[] [place] [facility]/ [x]  Follow-up with PCP Additional Notes: ML is being triaged for today in regards to interpretation of her lab results.   Reason for Disposition . Caller requesting lab results  (Exception: Routine or non-urgent lab result.)  Protocols used: PCP Call - No Triage-A-AH

## 2024-02-16 NOTE — Telephone Encounter (Signed)
 Noted.

## 2024-02-23 LAB — FECAL OCCULT BLOOD, IMMUNOCHEMICAL: Fecal Occult Bld: NEGATIVE

## 2024-02-29 ENCOUNTER — Other Ambulatory Visit: Payer: Self-pay

## 2024-03-01 ENCOUNTER — Other Ambulatory Visit: Payer: Self-pay

## 2024-03-04 ENCOUNTER — Other Ambulatory Visit: Payer: Self-pay

## 2024-03-20 ENCOUNTER — Other Ambulatory Visit: Payer: Self-pay

## 2024-03-20 ENCOUNTER — Other Ambulatory Visit (HOSPITAL_BASED_OUTPATIENT_CLINIC_OR_DEPARTMENT_OTHER): Payer: Self-pay | Admitting: Cardiovascular Disease

## 2024-03-21 ENCOUNTER — Other Ambulatory Visit: Payer: Self-pay

## 2024-03-21 ENCOUNTER — Encounter: Payer: Self-pay | Admitting: Pharmacist

## 2024-03-25 ENCOUNTER — Telehealth: Payer: Self-pay | Admitting: Internal Medicine

## 2024-03-25 DIAGNOSIS — Z8673 Personal history of transient ischemic attack (TIA), and cerebral infarction without residual deficits: Secondary | ICD-10-CM

## 2024-03-25 DIAGNOSIS — I69398 Other sequelae of cerebral infarction: Secondary | ICD-10-CM

## 2024-03-25 NOTE — Telephone Encounter (Signed)
 What is the referral to [facility] for?

## 2024-03-25 NOTE — Telephone Encounter (Signed)
 Copied from CRM [id number]. Topic: Referral - Request for Referral >> Mar 25, 2024  4:43 PM Zebedee SAUNDERS wrote:  Did the patient discuss referral with their provider in the last year? Yes (If No - schedule appointment) (If Yes - send message)  Appointment offered? Yes  Type of order/referral and detailed reason for visit: [facility] [address], [place], [place] [address] [phone]  Preference of office, provider, location:  [facility] [address], [place], [place] [address] [phone]  If referral order, have you been seen by this specialty before? Yes (If Yes, this issue or another issue? When? Where?  Can we respond through MyChart? Yes

## 2024-03-26 ENCOUNTER — Other Ambulatory Visit: Payer: Self-pay

## 2024-03-26 ENCOUNTER — Other Ambulatory Visit (HOSPITAL_BASED_OUTPATIENT_CLINIC_OR_DEPARTMENT_OTHER): Payer: Self-pay | Admitting: Cardiovascular Disease

## 2024-03-26 NOTE — Telephone Encounter (Signed)
 Called & spoke to the patient. Verified name & DOB. Patient stated that she previously would see the neurologist who usually orders an MRI. Patient stated that the 2 year mark is approaching.  Additionally, she would like to know if she could get a refill for Brilinta .

## 2024-03-27 ENCOUNTER — Other Ambulatory Visit: Payer: Self-pay

## 2024-03-27 NOTE — Addendum Note (Signed)
 Addended by: VICCI SOBER B on: 03/27/2024 08:18 AM   Modules accepted: Orders

## 2024-03-27 NOTE — Telephone Encounter (Signed)
 Referral submitted. Has RF on Brilinta . Looks like last dispensed 03/26/2024.

## 2024-03-29 ENCOUNTER — Other Ambulatory Visit: Payer: Self-pay

## 2024-03-29 NOTE — Telephone Encounter (Signed)
 Pt is overdue to see Dr. Mona. Pt has had numerous attempts to make overdue appt and pt has not done so. Would Dr. Mona like to refill pt's medication without being seen? Please address

## 2024-03-29 NOTE — Telephone Encounter (Signed)
 Needs appt with me or APP (michelle swinyer) first  Dr. Mona

## 2024-03-30 ENCOUNTER — Encounter (HOSPITAL_COMMUNITY): Payer: Self-pay | Admitting: Interventional Radiology

## 2024-04-01 NOTE — Telephone Encounter (Signed)
 Called and spoke to patient. Verified name & DOB. Informed that requested referral has been placed. Informed patient that refills are available. Last medication dispensed 03/26/2024. Patient expressed verbal understanding of all discussed.

## 2024-04-03 ENCOUNTER — Other Ambulatory Visit: Payer: Self-pay | Admitting: Internal Medicine

## 2024-04-03 DIAGNOSIS — Z1231 Encounter for screening mammogram for malignant neoplasm of breast: Secondary | ICD-10-CM

## 2024-04-12 ENCOUNTER — Encounter: Payer: Self-pay | Admitting: Pharmacist

## 2024-04-12 ENCOUNTER — Other Ambulatory Visit: Payer: Self-pay

## 2024-04-24 ENCOUNTER — Other Ambulatory Visit: Payer: Self-pay

## 2024-05-15 ENCOUNTER — Other Ambulatory Visit: Payer: Self-pay

## 2024-05-17 ENCOUNTER — Other Ambulatory Visit: Payer: Self-pay | Admitting: Internal Medicine

## 2024-05-17 NOTE — Telephone Encounter (Unsigned)
 Copied from CRM #[id number]. Topic: Clinical - Medication Refill >> May 17, 2024 11:29 AM Willma R wrote: Medication: ticagrelor  (BRILINTA ) 90 MG TABS tablet   Has the patient contacted their pharmacy? Yes, call dr  This is the patient's preferred pharmacy:  [facility] [address] [place] [address] Phone: [phone] Fax: [phone]  Is this the correct pharmacy for this prescription? Yes If no, delete pharmacy and type the correct one.   Has the prescription been filled recently? No  Is the patient out of the medication? No, 1 day left  Has the patient been seen for an appointment in the last year OR does the patient have an upcoming appointment? Yes  Can we respond through MyChart? No  Agent: Please be advised that Rx refills may take up to 3 business days. We ask that you follow-up with your pharmacy.

## 2024-05-21 ENCOUNTER — Other Ambulatory Visit: Payer: Self-pay

## 2024-05-21 MED ORDER — TICAGRELOR 90 MG PO TABS
90.0000 mg | ORAL_TABLET | Freq: Two times a day (BID) | ORAL | 0 refills | Status: DC
  Filled 2024-05-21: qty 60, 30d supply, fill #0
  Filled 2024-06-19 – 2024-06-20 (×2): qty 60, 30d supply, fill #1
  Filled 2024-08-20: qty 60, 30d supply, fill #2

## 2024-05-21 NOTE — Telephone Encounter (Signed)
 Requested Prescriptions  Pending Prescriptions Disp Refills   ticagrelor  (BRILINTA ) 90 MG TABS tablet 180 tablet 0    Sig: Take 1 tablet (90 mg total) by mouth 2 (two) times daily.     Hematology: Antiplatelets - ticagrelor  Failed - 05/21/2024  9:00 AM      Failed - Cr in normal range and within 180 days    Creat  Date Value Ref Range Status  09/05/2016 0.53 0.50 - 0.99 mg/dL Final    Comment:      For patients > or = 69 years of age: The upper reference limit for Creatinine is approximately 13% higher for people identified as African-American.      Creatinine, Ser  Date Value Ref Range Status  02/09/2024 0.54 (L) 0.57 - 1.00 mg/dL Final         Passed - HCT in normal range and within 180 days    Hematocrit  Date Value Ref Range Status  02/09/2024 44.2 34.0 - 46.6 % Final         Passed - HGB in normal range and within 180 days    Hemoglobin  Date Value Ref Range Status  02/09/2024 14.0 11.1 - 15.9 g/dL Final         Passed - PLT in normal range and within 180 days    Platelets  Date Value Ref Range Status  02/09/2024 287 150 - 450 x10E3/uL Final         Passed - Last Heart Rate in normal range    Pulse Readings from Last 1 Encounters:  02/09/24 76         Passed - Valid encounter within last 6 months    Recent Outpatient Visits           3 months ago Type 2 diabetes mellitus with obesity (HCC)   [facility] [place]. [facility] Vicci Sober B, MD   7 months ago Type 2 diabetes mellitus with obesity [facility])   [facility] - [facility] Dept Of [place]. [facility] Vicci Sober NOVAK, MD   8 months ago Essential hypertension   [facility] - A Dept Of [place]. [facility] Fleeta Morris, [place] L, RPH-CPP   9 months ago Hematoma of buttock   [facility] - [facility] Dept Of [place]. [facility] Vicci Sober B, MD   10 months ago Type 2  diabetes mellitus with obesity [facility])   [facility] - A Dept Of [place]. [facility] Vicci Sober NOVAK, MD

## 2024-05-22 ENCOUNTER — Other Ambulatory Visit: Payer: Self-pay

## 2024-05-31 ENCOUNTER — Other Ambulatory Visit: Payer: Self-pay

## 2024-06-19 ENCOUNTER — Other Ambulatory Visit (HOSPITAL_BASED_OUTPATIENT_CLINIC_OR_DEPARTMENT_OTHER): Payer: Self-pay | Admitting: Cardiovascular Disease

## 2024-06-19 ENCOUNTER — Other Ambulatory Visit: Payer: Self-pay | Admitting: Internal Medicine

## 2024-06-19 DIAGNOSIS — F32A Depression, unspecified: Secondary | ICD-10-CM

## 2024-06-19 DIAGNOSIS — R42 Dizziness and giddiness: Secondary | ICD-10-CM

## 2024-06-20 ENCOUNTER — Other Ambulatory Visit: Payer: Self-pay

## 2024-06-20 MED ORDER — ESCITALOPRAM OXALATE 20 MG PO TABS
20.0000 mg | ORAL_TABLET | Freq: Every day | ORAL | 0 refills | Status: DC
  Filled 2024-06-20 – 2024-08-20 (×2): qty 30, 30d supply, fill #0

## 2024-06-20 MED ORDER — MECLIZINE HCL 12.5 MG PO TABS
12.5000 mg | ORAL_TABLET | Freq: Two times a day (BID) | ORAL | 0 refills | Status: DC | PRN
  Filled 2024-06-20: qty 60, 30d supply, fill #0

## 2024-06-20 NOTE — Telephone Encounter (Signed)
 Pt of Dr. Francyne. Passed her 3rd attempt. Does Dr. Francyne want to refill? Please advise.

## 2024-06-20 NOTE — Telephone Encounter (Signed)
 Dr. Katharina patient actually

## 2024-06-24 ENCOUNTER — Other Ambulatory Visit: Payer: Self-pay

## 2024-06-26 ENCOUNTER — Other Ambulatory Visit: Payer: Self-pay

## 2024-06-28 ENCOUNTER — Other Ambulatory Visit: Payer: Self-pay

## 2024-06-28 ENCOUNTER — Encounter (HOSPITAL_COMMUNITY): Payer: Self-pay

## 2024-07-01 ENCOUNTER — Other Ambulatory Visit: Payer: Self-pay

## 2024-08-08 ENCOUNTER — Other Ambulatory Visit: Payer: Self-pay

## 2024-08-08 ENCOUNTER — Encounter: Payer: Self-pay | Admitting: Internal Medicine

## 2024-08-08 ENCOUNTER — Ambulatory Visit: Payer: Self-pay | Attending: Family Medicine | Admitting: Internal Medicine

## 2024-08-08 DIAGNOSIS — E785 Hyperlipidemia, unspecified: Secondary | ICD-10-CM

## 2024-08-08 DIAGNOSIS — Z6838 Body mass index (BMI) 38.0-38.9, adult: Secondary | ICD-10-CM

## 2024-08-08 DIAGNOSIS — E1159 Type 2 diabetes mellitus with other circulatory complications: Secondary | ICD-10-CM

## 2024-08-08 DIAGNOSIS — Z23 Encounter for immunization: Secondary | ICD-10-CM

## 2024-08-08 DIAGNOSIS — Z87891 Personal history of nicotine dependence: Secondary | ICD-10-CM

## 2024-08-08 DIAGNOSIS — I69398 Other sequelae of cerebral infarction: Secondary | ICD-10-CM

## 2024-08-08 DIAGNOSIS — H9201 Otalgia, right ear: Secondary | ICD-10-CM

## 2024-08-08 DIAGNOSIS — R09A2 Foreign body sensation, throat: Secondary | ICD-10-CM

## 2024-08-08 DIAGNOSIS — I152 Hypertension secondary to endocrine disorders: Secondary | ICD-10-CM

## 2024-08-08 DIAGNOSIS — R569 Unspecified convulsions: Secondary | ICD-10-CM

## 2024-08-08 DIAGNOSIS — Z8673 Personal history of transient ischemic attack (TIA), and cerebral infarction without residual deficits: Secondary | ICD-10-CM

## 2024-08-08 DIAGNOSIS — E1169 Type 2 diabetes mellitus with other specified complication: Secondary | ICD-10-CM

## 2024-08-08 DIAGNOSIS — Q283 Other malformations of cerebral vessels: Secondary | ICD-10-CM

## 2024-08-08 LAB — POCT GLYCOSYLATED HEMOGLOBIN (HGB A1C): HbA1c, POC (controlled diabetic range): 5.5 % (ref 0.0–7.0)

## 2024-08-08 LAB — GLUCOSE, POCT (MANUAL RESULT ENTRY): POC Glucose: 143 mg/dL — AB (ref 70–99)

## 2024-08-08 MED ORDER — EZETIMIBE 10 MG PO TABS
10.0000 mg | ORAL_TABLET | Freq: Every day | ORAL | 3 refills | Status: AC
  Filled 2024-08-08: qty 30, 30d supply, fill #0
  Filled 2024-08-20: qty 30, 30d supply, fill #1
  Filled 2024-09-07 – 2024-09-10 (×2): qty 30, 30d supply, fill #2
  Filled 2024-11-06: qty 30, 30d supply, fill #3

## 2024-08-08 MED ORDER — PRAVASTATIN SODIUM 10 MG PO TABS
10.0000 mg | ORAL_TABLET | ORAL | 1 refills | Status: DC
  Filled 2024-08-08: qty 36, 84d supply, fill #0
  Filled 2024-08-20: qty 36, 84d supply, fill #1

## 2024-08-08 NOTE — Progress Notes (Signed)
 Patient ID: Heather [name], female    DOB: Aug 29, 1956  MRN: [id number]  CC: Diabetes (DM f/u. [place] feeling something is in her trachea/Discomfort on R side of neck, ear pain, eye pain X2 weeks/Discuss Lisinopril  - causing pt to cough /Reports stopping Ozempic  4 mo ago due to side effects/Requesting MRI imaging/Flu vax administered on 08/08/2024 - C.A.)   Subjective: Heather [name] is a 68 y.o. female who presents for chronic ds management. Her concerns today include:  Hx of DM, HTN, HL,former smoker tob dep, CVA RT MCA territory, s/p thrombectomy followed by stenting of M2 by interventional radiology, sz disorder, depression   AMN Language interpreter used during this encounter. #Heather [name]   Discussed the use of AI scribe software for clinical note transcription with the patient, who gave verbal consent to proceed.  History of Present Illness Heather [name] is a 68 year old female with diabetes and hypertension who presents for follow-up on her chronic medical conditions.  DM:  BS 143/A1C 5.5 She discontinued Ozempic  four months ago due to side effects including dry mouth, dizziness, blurred vision, and upset stomach, which began after her dose was increased to two milligrams. She prefers not to resume this medication. Her A1c is currently 5.5, and she is not on any diabetes medication. She reports walking three to four times a week for over 45 minutes.  She stopped taking lisinopril  about three weeks ago due to a persistent cough, which resolved upon discontinuation.  For cholesterol management, she takes pravastatin  three times a week due to body aches when she takes everyday and is not consistently taking Zetia .  She continues to take Keppra  twice daily for seizure prevention and reports no recent seizures. She also takes Brilinta  twice daily following a history of stroke and stent placement in a cerebral vessel. She has not had an MRI  in nearly two years, despite being advised to have one biennially. She last saw the specialist Dr. Dolphus who placed her stent two years ago.  She reports intermittent pain in her right eye and ear, and a sensation of something stuck in her throat when swallowing. Sensation in throat present for about two months and present when swallowing liquids or solids. No pain in the throat. The ear pain is relieved with Tylenol  and is described as a dry sensation in both ears. Over due for DM eye exam.    Patient Active Problem List   Diagnosis Date Noted   Major depressive disorder, recurrent episode, mild 02/09/2024   Stenosis of artery 11/13/2020   Major depression in remission 06/16/2020   Stenosis of intracranial vessel 06/16/2020   Seizures (HCC) 03/23/2019   TIA (transient ischemic attack) 11/18/2018   Depression 02/21/2018   Essential hypertension 02/21/2018   Cerebrovascular accident (CVA) due to embolism of right middle cerebral artery (HCC) 02/21/2018   Type 2 diabetes mellitus with obesity 12/20/2017   Right middle cerebral artery stroke (HCC) 12/19/2017   Left hemiparesis (HCC)    Dysphagia, oropharyngeal    Middle cerebral artery embolism, 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     Current Outpatient Medications on File Prior to Visit  Medication Sig Dispense Refill   acetaminophen  (TYLENOL ) 500 MG tablet Take 1,000 mg by mouth every 8 (eight) hours as needed for moderate pain or headache.     Blood Glucose Monitoring Suppl (TRUE METRIX METER) w/Device KIT Use 4 (four) times daily to check blood sugar 1 kit 0   Cholecalciferol (VITAMIN D3  PO) Take 1 tablet by mouth daily.     diclofenac  Sodium (VOLTAREN ) 1 % GEL Apply 2 g topically 2 (two) times daily as needed (apply to knees). 100 g 1   escitalopram  (LEXAPRO ) 20 MG tablet Take 1 tablet (20 mg total) by mouth daily.Must have office visit for refills 30 tablet 0   furosemide  (LASIX ) 20 MG tablet Take 1 tablet (20 mg total) by mouth daily as needed  for fluid. 30 tablet 0   glucose blood (TRUE METRIX BLOOD GLUCOSE TEST) test strip Use as instructed 100 each 2   levETIRAcetam  (KEPPRA ) 500 MG tablet Take 1 tablet (500 mg total) by mouth 2 (two) times daily. 180 tablet 4   meclizine  (ANTIVERT ) 12.5 MG tablet Take 1 tablet (12.5 mg total) by mouth 2 (two) times daily as needed for dizziness. 60 tablet 0   pantoprazole  (PROTONIX ) 40 MG tablet Take 1 tablet (40 mg total) by mouth daily. 30 tablet 3   ticagrelor  (BRILINTA ) 90 MG TABS tablet Take 1 tablet (90 mg total) by mouth 2 (two) times daily. 180 tablet 0   traZODone  (DESYREL ) 50 MG tablet Take 0.5 tablets (25 mg total) by mouth Nightly. 45 tablet 1   triamcinolone  cream (KENALOG ) 0.1 % Apply 1 Application topically 2 (two) times daily as needed. 45 g 0   TRUEplus Lancets 28G MISC Use 4 times daily 100 each 3   No current facility-administered medications on file prior to visit.    Allergies  Allergen Reactions   Lisinopril  Cough   Ozempic  (0.25 Or 0.5 Mg-Dose) [Semaglutide (0.25 Or 0.5mg -Dos)] Other (See Comments)    dry mouth, dizziness, blurred vision, and upset stomach,    Social History   Socioeconomic History   Marital status: Single    Spouse name: Not on file   Number of children: Not on file   Years of education: Not on file   Highest education level: Not on file  Occupational History   Not on file  Tobacco Use   Smoking status: Former    Current packs/day: 0.00    Average packs/day: 0.5 packs/day for 30.0 years (15.0 ttl pk-yrs)    Types: Cigarettes    Start date: 12/05/1987    Quit date: 12/04/2017    Years since quitting: 6.6   Smokeless tobacco: Never  Vaping Use   Vaping status: Never Used  Substance and Sexual Activity   Alcohol use: Not Currently    Comment: weekly   Drug use: No   Sexual activity: Not Currently    Birth control/protection: None  Other Topics Concern   Not on file  Social History Narrative   Not on file   Social Drivers of Health    Financial Resource Strain: Low Risk  (08/08/2024)   Overall Financial Resource Strain (CARDIA)    Difficulty of Paying Living Expenses: Not very hard  Food Insecurity: No Food Insecurity (08/08/2024)   Hunger Vital Sign    Worried About Running Out of Food in the Last Year: Never true    Ran Out of Food in the Last Year: Never true  Transportation Needs: No Transportation Needs (08/08/2024)   PRAPARE - Administrator, Civil Service (Medical): No    Lack of Transportation (Non-Medical): No  Physical Activity: Inactive (08/08/2024)   Exercise Vital Sign    Days of Exercise per Week: 0 days    Minutes of Exercise per Session: 0 min  Stress: No Stress Concern Present (08/08/2024)   [employer] of  Occupational Health - Occupational Stress Questionnaire    Feeling of Stress: Not at all  Social Connections: Not on file  Intimate Partner Violence: Not At Risk (08/08/2024)   Humiliation, Afraid, Rape, and Kick questionnaire    Fear of Current or Ex-Partner: No    Emotionally Abused: No    Physically Abused: No    Sexually Abused: No    Family History  Problem Relation Age of Onset   Diabetes Mother    Pulmonary disease Mother    Heart disease Father    Stroke Sister     Past Surgical History:  Procedure Laterality Date   BREAST EXCISIONAL BIOPSY     BREAST SURGERY     right breast   CHOLECYSTECTOMY     IR ANGIO INTRA EXTRACRAN SEL COM CAROTID INNOMINATE BILAT MOD SED  03/06/2018   IR ANGIO INTRA EXTRACRAN SEL COM CAROTID INNOMINATE BILAT MOD SED  11/20/2018   IR ANGIO INTRA EXTRACRAN SEL COM CAROTID INNOMINATE BILAT MOD SED  10/31/2019   IR ANGIO VERTEBRAL SEL VERTEBRAL BILAT MOD SED  03/06/2018   IR ANGIO VERTEBRAL SEL VERTEBRAL BILAT MOD SED  11/20/2018   IR ANGIO VERTEBRAL SEL VERTEBRAL BILAT MOD SED  10/31/2019   IR CT HEAD LTD  12/18/2017   IR INTRA CRAN STENT  12/18/2017   IR PERCUTANEOUS ART THROMBECTOMY/INFUSION INTRACRANIAL INC DIAG ANGIO  12/18/2017   IR  RADIOLOGIST EVAL & MGMT  01/31/2018   IR US  GUIDE VASC ACCESS RIGHT  10/31/2019   RADIOLOGY WITH ANESTHESIA N/A 12/18/2017   Procedure: RADIOLOGY WITH ANESTHESIA;  Surgeon: Dolphus Carrion, MD;  Location: [facility] OR;  Service: Radiology;  Laterality: N/A;    ROS: Review of Systems Negative except as stated above  PHYSICAL EXAM: BP 115/76 (BP Location: Left Arm, Patient Position: Sitting, Cuff Size: Large)   Pulse 70   Temp 98 F (36.7 C) (Oral)   Ht 5' 7 (1.702 m)   Wt 247 lb (112 kg)   SpO2 97%   BMI 38.69 kg/m   Wt Readings from Last 3 Encounters:  08/08/24 247 lb (112 kg)  02/09/24 246 lb (111.6 kg)  10/12/23 253 lb (114.8 kg)   Physical Exam  General appearance - alert, well appearing, and in no distress Mental status - normal mood, behavior, speech, dress, motor activity, and thought processes Mouth - mucous membranes moist, pharynx normal without lesions Ears: Both ear canal and tympanic membrane are within normal limits. Neck - supple, no significant adenopathy Chest - clear to auscultation, no wheezes, rales or rhonchi, symmetric air entry Heart - normal rate, regular rhythm, normal S1, S2, no murmurs, rubs, clicks or gallops Extremities - peripheral pulses normal, no pedal edema, no clubbing or cyanosis      Latest Ref Rng & Units 02/09/2024   11:14 AM 04/18/2023    8:17 PM 12/21/2022   10:15 AM  CMP  Glucose 70 - 99 mg/dL 880  884  857   BUN 8 - 27 mg/dL 13  15  11    Creatinine 0.57 - 1.00 mg/dL 9.45  9.37  9.45   Sodium 134 - 144 mmol/L 140  137  139   Potassium 3.5 - 5.2 mmol/L 4.7  4.2  5.0   Chloride 96 - 106 mmol/L 104  103  105   CO2 20 - 29 mmol/L 23  24  19    Calcium  8.7 - 10.3 mg/dL 9.7  9.5  9.4   Total Protein 6.0 - 8.5 g/dL 6.5  7.0  7.0   Total Bilirubin 0.0 - 1.2 mg/dL 0.4  <9.8  0.5   Alkaline Phos 44 - 121 IU/L 80  62  85   AST 0 - 40 IU/L 16  22  23    ALT 0 - 32 IU/L 16  19  14     Lipid Panel     Component Value Date/Time   CHOL 175  12/21/2022 1015   TRIG 105 12/21/2022 1015   HDL 52 12/21/2022 1015   CHOLHDL 3.4 12/21/2022 1015   CHOLHDL 2.5 03/24/2019 0611   VLDL 15 03/24/2019 0611   LDLCALC 104 (H) 12/21/2022 1015    CBC    Component Value Date/Time   WBC 6.7 02/09/2024 1114   WBC 8.8 04/18/2023 2017   RBC 4.52 02/09/2024 1114   RBC 4.29 04/18/2023 2017   HGB 14.0 02/09/2024 1114   HCT 44.2 02/09/2024 1114   PLT 287 02/09/2024 1114   MCV 98 (H) 02/09/2024 1114   MCH 31.0 02/09/2024 1114   MCH 30.5 04/18/2023 2017   MCHC 31.7 02/09/2024 1114   MCHC 32.6 04/18/2023 2017   RDW 12.2 02/09/2024 1114   LYMPHSABS 2.6 04/18/2023 2017   MONOABS 0.6 04/18/2023 2017   EOSABS 0.1 04/18/2023 2017   BASOSABS 0.0 04/18/2023 2017    ASSESSMENT AND PLAN: 1. Type 2 diabetes mellitus associated with morbid obesity (HCC) (Primary) Control on diet alone.  Encouraged her to continue healthy eating habits and regular exercise.  Her weight has stayed stable since her last visit 6 months ago - POCT glucose (manual entry) - POCT glycosylated hemoglobin (Hb A1C) - Microalbumin / creatinine urine ratio - Lipid panel - CBC - Comprehensive metabolic panel with GFR  2. Hypertension associated with type 2 diabetes mellitus (HCC) Controlled off medication.  Will continue to monitor.  Lisinopril  placed on allergy list as it causes cough for her.  3. Seizure as late effect of cerebrovascular accident (CVA) (HCC) Stable on Keppra   4. Hyperlipidemia associated with type 2 diabetes mellitus (HCC) Okay to continue taking pravastatin  3 times a week since this is the most she is able to tolerate taking without significant muscle aches.  Refill given on Setia. - pravastatin  (PRAVACHOL ) 10 MG tablet; Take 1 tablet (10 mg total) by mouth 3 (three) times a week.Pt must schedule an overdue followup appt with Cardiology for any more refills. [phone] 3rd and FINAL  attempt Thank You  Dispense: 36 tablet; Refill: 1 - ezetimibe  (ZETIA )  10 MG tablet; Take 1 tablet (10 mg total) by mouth daily.Pt must schedule an overdue followup appt with Cardiology for any more refills. [phone] 3rd and FINAL  attempt Thank You  Dispense: 30 tablet; Refill: 3  5. History of cardioembolic cerebrovascular accident (CVA) Continue Brilinta   6. Other malformations of cerebral vessels Will try to get her back in with Dr. Dolphus for f/u and will order MRA to check stent - Ambulatory referral to Interventional Radiology - MR Angiogram Head Wo Contrast; Future  7. Globus sensation - Ambulatory referral to Gastroenterology  8. Right ear pain Reassurance given.  Ear canal and tympanic membrane appear normal  9. Need for influenza vaccination Given today    Patient was given the opportunity to ask questions.  Patient verbalized understanding of the plan and was able to repeat key elements of the plan.   This documentation was completed using Paediatric nurse.  Any transcriptional errors are unintentional.  Orders Placed This Encounter  Procedures  MR Angiogram Head Wo Contrast   Flu vaccine HIGH DOSE PF(Fluzone Trivalent)   Microalbumin / creatinine urine ratio   Lipid panel   CBC   Comprehensive metabolic panel with GFR   Ambulatory referral to Interventional Radiology   Ambulatory referral to Gastroenterology   POCT glucose (manual entry)   POCT glycosylated hemoglobin (Hb A1C)     Requested Prescriptions   Signed Prescriptions Disp Refills   pravastatin  (PRAVACHOL ) 10 MG tablet 36 tablet 1    Sig: Take 1 tablet (10 mg total) by mouth 3 (three) times a week.Pt must schedule an overdue followup appt with Cardiology for any more refills. [phone] 3rd and FINAL  attempt Thank You   ezetimibe  (ZETIA ) 10 MG tablet 30 tablet 3    Sig: Take 1 tablet (10 mg total) by mouth daily.Pt must schedule an overdue followup appt with Cardiology for any more refills. [phone] 3rd and FINAL  attempt Thank You     Return in about 6 months (around 02/05/2025).  Barnie Louder, MD, FACP

## 2024-08-08 NOTE — Patient Instructions (Signed)
  VISIT SUMMARY: Today, we reviewed your chronic medical conditions, including diabetes, hypertension, cholesterol management, seizure disorder, and history of stroke. We also discussed your recent symptoms of throat discomfort, ear pain, and eye pain.  YOUR PLAN: -TYPE 2 DIABETES MELLITUS: Your diabetes is well-controlled with an A1c of 5.5%. Since you experienced side effects from Ozempic , we will continue to manage your diabetes with your current diet and exercise routine.  -HYPERLIPIDEMIA: Your cholesterol is being managed with pravastatin  three times a week. We will also start you on Zetia  daily to help further control your cholesterol levels.  -SEIZURE DISORDER: Your seizures are well-controlled with Keppra , and you have not had any recent seizures. Continue taking Keppra  twice daily.  -HISTORY OF CEREBRAL INFARCTION WITH INTRACRANIAL STENT: You are taking Brilinta  twice daily to prevent further strokes. You are due for an MRI to monitor your condition.  -OVERWEIGHT: Your weight is stable, and you are engaging in regular physical activity. Continue with your current exercise routine.  -OROPHARYNGEAL DYSPHAGIA: You have been experiencing a sensation of something stuck in your throat for two months. We are referring you to a gastroenterologist for further evaluation.  -INTERMITTENT RIGHT EAR PAIN AND DRYNESS: You have intermittent ear pain that is relieved by Tylenol . There are no signs of infection. Continue using Tylenol  as needed for pain relief.  -EYE PAIN, INTERMITTENT: You have intermittent eye pain. We recommend you get an eye exam at [facility] to check for any issues related to your diabetes.  -GENERAL HEALTH MAINTENANCE: You are due for a flu shot. Please make sure to get it soon.  INSTRUCTIONS: Please schedule an appointment with a gastroenterologist for your throat discomfort. Also, schedule an MRI to monitor your intracranial stent. Don't forget to get your flu shot  and an eye exam at [facility].                      Contains text generated by Abridge.                                 Contains text generated by Abridge.

## 2024-08-09 ENCOUNTER — Other Ambulatory Visit: Payer: Self-pay

## 2024-08-10 ENCOUNTER — Ambulatory Visit: Payer: Self-pay | Admitting: Internal Medicine

## 2024-08-10 LAB — CBC
Hematocrit: 42.9 % (ref 34.0–46.6)
Hemoglobin: 14.2 g/dL (ref 11.1–15.9)
MCH: 31.6 pg (ref 26.6–33.0)
MCHC: 33.1 g/dL (ref 31.5–35.7)
MCV: 96 fL (ref 79–97)
Platelets: 283 x10E3/uL (ref 150–450)
RBC: 4.49 x10E6/uL (ref 3.77–5.28)
RDW: 12 % (ref 11.7–15.4)
WBC: 6.6 x10E3/uL (ref 3.4–10.8)

## 2024-08-10 LAB — COMPREHENSIVE METABOLIC PANEL WITH GFR
ALT: 20 IU/L (ref 0–32)
AST: 23 IU/L (ref 0–40)
Albumin: 4.3 g/dL (ref 3.9–4.9)
Alkaline Phosphatase: 83 IU/L (ref 49–135)
BUN/Creatinine Ratio: 25 (ref 12–28)
BUN: 14 mg/dL (ref 8–27)
Bilirubin Total: 0.4 mg/dL (ref 0.0–1.2)
CO2: 25 mmol/L (ref 20–29)
Calcium: 9.7 mg/dL (ref 8.7–10.3)
Chloride: 100 mmol/L (ref 96–106)
Creatinine, Ser: 0.57 mg/dL (ref 0.57–1.00)
Globulin, Total: 2.8 g/dL (ref 1.5–4.5)
Glucose: 100 mg/dL — ABNORMAL HIGH (ref 70–99)
Potassium: 4.5 mmol/L (ref 3.5–5.2)
Sodium: 138 mmol/L (ref 134–144)
Total Protein: 7.1 g/dL (ref 6.0–8.5)
eGFR: 98 mL/min/1.73 (ref >59)

## 2024-08-10 LAB — MICROALBUMIN / CREATININE URINE RATIO
Creatinine, Urine: 188 mg/dL
Microalb/Creat Ratio: 12 mg/g{creat} (ref 0–29)
Microalbumin, Urine: 22.2 ug/mL

## 2024-08-10 LAB — LIPID PANEL
Chol/HDL Ratio: 3.3 ratio (ref 0.0–4.4)
Cholesterol, Total: 179 mg/dL (ref 100–199)
HDL: 55 mg/dL (ref >39)
LDL Chol Calc (NIH): 106 mg/dL — ABNORMAL HIGH (ref 0–99)
Triglycerides: 101 mg/dL (ref 0–149)
VLDL Cholesterol Cal: 18 mg/dL (ref 5–40)

## 2024-08-12 ENCOUNTER — Other Ambulatory Visit: Payer: Self-pay | Admitting: Internal Medicine

## 2024-08-12 ENCOUNTER — Telehealth: Payer: Self-pay | Admitting: Internal Medicine

## 2024-08-12 DIAGNOSIS — R42 Dizziness and giddiness: Secondary | ICD-10-CM

## 2024-08-12 NOTE — Telephone Encounter (Signed)
 Copied from CRM [id number]. Topic: Clinical - Medication Question >> Aug 12, 2024 11:21 AM Antwanette L wrote:  Reason for CRM: The patient is requesting a refill for Glimepiride  1 mg, which is not currently listed on her medication chart. She reports that she is out of the medication. If approved, the prescription can be sent to [facility] at [address], [address], [place], [place] [address]. The patient can be contacted at [phone]

## 2024-08-12 NOTE — Telephone Encounter (Signed)
 Copied from CRM [id number]. Topic: Clinical - Lab/Test Results >> Aug 12, 2024 11:22 AM Antwanette L wrote:  Reason for CRM: Relayed lab results and pt has addtl questions. The pt can be contacted at [phone]

## 2024-08-12 NOTE — Telephone Encounter (Signed)
 A1C level on recent visit was normal. Pt told me she had stopped the Ozempic  4 months ago and we had her stop Glimepiride  in May. Last week she told me she was not  taking any medication for her Diabetes. Given her A1C, she does not need to be restart on Glimepiride .

## 2024-08-12 NOTE — Telephone Encounter (Unsigned)
 Copied from CRM #[id number]. Topic: Clinical - Medication Refill >> Aug 12, 2024 11:09 AM Antwanette L wrote: Medication: meclizine  (ANTIVERT ) 12.5 MG tablet  Has the patient contacted their pharmacy? Yes. The patient stated that the pharmacy informed her she needs to schedule an appointment. She was last seen by Dr. Vicci on August 08, 2024.  This is the patient's preferred pharmacy:  [facility] [address], [address] [place] [address] Phone: [phone] Fax: [phone]    Is this the correct pharmacy for this prescription? Yes   Has the prescription been filled recently? Yes. Last refill was on 06/20/24  Is the patient out of the medication? Yes  Has the patient been seen for an appointment in the last year OR does the patient have an upcoming appointment? Yes. Last ov with Dr. Vicci  was on 08/08/24  Can we respond through MyChart? No. Contact the patient by phone at [phone]  Agent: Please be advised that Rx refills may take up to 3 business days. We ask that you follow-up with your pharmacy.

## 2024-08-13 MED ORDER — MECLIZINE HCL 12.5 MG PO TABS
12.5000 mg | ORAL_TABLET | Freq: Two times a day (BID) | ORAL | 1 refills | Status: DC | PRN
  Filled 2024-08-13: qty 60, 30d supply, fill #0
  Filled 2024-08-20: qty 60, 30d supply, fill #1

## 2024-08-13 NOTE — Telephone Encounter (Signed)
 Requested medications are due for refill today.  yes  Requested medications are on the active medications list.  yes  Last refill. 06/20/2024 #60 0 rf  Future visit scheduled.   yes  Notes to clinic.  Refill not delegated.    Requested Prescriptions  Pending Prescriptions Disp Refills   meclizine  (ANTIVERT ) 12.5 MG tablet 60 tablet 0    Sig: Take 1 tablet (12.5 mg total) by mouth 2 (two) times daily as needed for dizziness.     Not Delegated - Gastroenterology: Antiemetics Failed - 08/13/2024  5:35 PM      Failed - This refill cannot be delegated      Failed - Valid encounter within last 6 months    Recent Outpatient Visits           5 days ago Type 2 diabetes mellitus associated with morbid obesity (HCC)   [facility] [place]. [facility] Vicci Sober B, MD   6 months ago Type 2 diabetes mellitus with obesity   [facility] Dept Of [place]. [facility] Vicci Sober B, MD   10 months ago Type 2 diabetes mellitus with obesity   [facility] [facility] Dept Of [place]. [facility] Vicci Sober NOVAK, MD   11 months ago Essential hypertension   [place] [facility] [place] [facility] Dept Of [place]. [facility] Fleeta Tonia Garnette LITTIE, RPH-CPP   1 year ago Hematoma of buttock    [facility] - A Dept Of [place]. [facility] Vicci Sober NOVAK, MD

## 2024-08-14 ENCOUNTER — Other Ambulatory Visit: Payer: Self-pay

## 2024-08-14 NOTE — Telephone Encounter (Signed)
 Called & spoke to the patient. Verified name & DOB. Informed that per Dr.Johnson, A1C level on recent visit was normal. Pt told Dr.Johnson she had stopped the Ozempic  4 months ago and had  stopped Glimepiride  in May. Last week she told Dr.Johnson she was not  taking any medication for her Diabetes. Given her A1C, she does not need to be restart on Glimepiride . Patient expressed verbal understanding of all discussed.

## 2024-08-19 ENCOUNTER — Ambulatory Visit (HOSPITAL_COMMUNITY)
Admission: RE | Admit: 2024-08-19 | Discharge: 2024-08-19 | Disposition: A | Discharge status: Home / Self Care | Payer: Self-pay | Source: Ambulatory Visit | Attending: Internal Medicine | Admitting: Internal Medicine

## 2024-08-19 DIAGNOSIS — Q283 Other malformations of cerebral vessels: Secondary | ICD-10-CM | POA: Insufficient documentation

## 2024-08-20 ENCOUNTER — Other Ambulatory Visit: Payer: Self-pay

## 2024-08-20 ENCOUNTER — Other Ambulatory Visit: Payer: Self-pay | Admitting: Internal Medicine

## 2024-08-21 ENCOUNTER — Other Ambulatory Visit: Payer: Self-pay

## 2024-08-21 MED ORDER — TRAZODONE HCL 50 MG PO TABS
25.0000 mg | ORAL_TABLET | Freq: Every evening | ORAL | 1 refills | Status: AC
  Filled 2024-08-21 – 2024-09-09 (×3): qty 45, 90d supply, fill #0

## 2024-08-27 ENCOUNTER — Telehealth: Payer: Self-pay | Admitting: Internal Medicine

## 2024-08-27 NOTE — Telephone Encounter (Signed)
 Called & spoke to the patient. Verified name & DOB. Informed of MRI results as follows per Dr.Johnson : Let pt know that stent in blood vessel of brain still patent which is good. This means the stent is not clogged up. Patient expressed verbal understanding.

## 2024-08-27 NOTE — Telephone Encounter (Signed)
 Copied from CRM [id number]. Topic: Clinical - Lab/Test Results >> Aug 27, 2024 12:02 PM Santiya F wrote:  Reason for CRM: Patient is calling in because she had an MRI done and wanted the results. Please follow up with patient.

## 2024-08-30 ENCOUNTER — Other Ambulatory Visit: Payer: Self-pay

## 2024-09-02 ENCOUNTER — Other Ambulatory Visit: Payer: Self-pay

## 2024-09-07 ENCOUNTER — Other Ambulatory Visit: Payer: Self-pay | Admitting: Internal Medicine

## 2024-09-07 DIAGNOSIS — E1169 Type 2 diabetes mellitus with other specified complication: Secondary | ICD-10-CM

## 2024-09-07 DIAGNOSIS — I69398 Other sequelae of cerebral infarction: Secondary | ICD-10-CM

## 2024-09-07 DIAGNOSIS — R42 Dizziness and giddiness: Secondary | ICD-10-CM

## 2024-09-08 ENCOUNTER — Other Ambulatory Visit: Payer: Self-pay

## 2024-09-09 ENCOUNTER — Other Ambulatory Visit: Payer: Self-pay

## 2024-09-09 MED ORDER — PRAVASTATIN SODIUM 10 MG PO TABS
10.0000 mg | ORAL_TABLET | ORAL | 1 refills | Status: AC
  Filled 2024-09-09 – 2024-09-10 (×2): qty 36, 84d supply, fill #0

## 2024-09-09 MED ORDER — MECLIZINE HCL 12.5 MG PO TABS
12.5000 mg | ORAL_TABLET | Freq: Two times a day (BID) | ORAL | 1 refills | Status: AC | PRN
  Filled 2024-09-09 – 2024-09-10 (×2): qty 60, 30d supply, fill #0

## 2024-09-09 MED ORDER — LEVETIRACETAM 500 MG PO TABS
500.0000 mg | ORAL_TABLET | Freq: Two times a day (BID) | ORAL | 4 refills | Status: AC
  Filled 2024-09-09 – 2024-09-19 (×3): qty 180, 90d supply, fill #0

## 2024-09-09 MED ORDER — TICAGRELOR 90 MG PO TABS
90.0000 mg | ORAL_TABLET | Freq: Two times a day (BID) | ORAL | 0 refills | Status: AC
  Filled 2024-09-09 – 2024-09-10 (×2): qty 180, 90d supply, fill #0
  Filled 2024-09-19 – 2024-10-21 (×2): qty 60, 30d supply, fill #1

## 2024-09-11 ENCOUNTER — Other Ambulatory Visit: Payer: Self-pay

## 2024-09-19 ENCOUNTER — Other Ambulatory Visit: Payer: Self-pay | Admitting: Internal Medicine

## 2024-09-19 DIAGNOSIS — F32A Depression, unspecified: Secondary | ICD-10-CM

## 2024-09-20 ENCOUNTER — Other Ambulatory Visit: Payer: Self-pay

## 2024-09-21 MED ORDER — ESCITALOPRAM OXALATE 20 MG PO TABS
20.0000 mg | ORAL_TABLET | Freq: Every day | ORAL | 5 refills | Status: AC
  Filled 2024-09-21: qty 30, 30d supply, fill #0
  Filled 2024-10-21 – 2024-10-22 (×2): qty 30, 30d supply, fill #1

## 2024-09-23 ENCOUNTER — Other Ambulatory Visit: Payer: Self-pay

## 2024-10-15 ENCOUNTER — Ambulatory Visit
Admission: RE | Admit: 2024-10-15 | Discharge: 2024-10-15 | Disposition: A | Discharge status: Home / Self Care | Payer: Self-pay | Source: Ambulatory Visit | Attending: Internal Medicine | Admitting: Internal Medicine

## 2024-10-15 DIAGNOSIS — Z1231 Encounter for screening mammogram for malignant neoplasm of breast: Secondary | ICD-10-CM | POA: Insufficient documentation

## 2024-10-17 ENCOUNTER — Ambulatory Visit: Payer: Self-pay | Admitting: Internal Medicine

## 2024-10-21 ENCOUNTER — Other Ambulatory Visit: Payer: Self-pay | Admitting: Internal Medicine

## 2024-10-21 DIAGNOSIS — E669 Obesity, unspecified: Secondary | ICD-10-CM

## 2024-10-22 ENCOUNTER — Other Ambulatory Visit: Payer: Self-pay

## 2024-10-22 MED ORDER — TRUEPLUS LANCETS 28G MISC
28.0000 g | Freq: Four times a day (QID) | 3 refills | Status: AC
  Filled 2024-10-22: qty 100, 25d supply, fill #0

## 2024-10-22 MED ORDER — TRUE METRIX METER W/DEVICE KIT
1.0000 | PACK | Freq: Four times a day (QID) | 0 refills | Status: AC
  Filled 2024-10-22: qty 1, 30d supply, fill #0

## 2024-10-22 NOTE — Telephone Encounter (Signed)
 Requested medications are due for refill today.  yes  Requested medications are on the active medications list.  yes  Last refill. Machine 10/20/2022, Lancets 06/14/2022 #100 3 rf  Future visit scheduled.   yes  Notes to clinic.  Diagnosis code has been removed. Unable to sign refill.    Requested Prescriptions  Pending Prescriptions Disp Refills   TRUEplus Lancets 28G MISC 100 each 3    Sig: Use 4 times daily     Endocrinology: Diabetes - Testing Supplies Passed - 10/22/2024  1:31 PM      Passed - Valid encounter within last 12 months    Recent Outpatient Visits           2 months ago Type 2 diabetes mellitus associated with morbid obesity (HCC)   [facility] [facility] Of [place]. [facility] Vicci Sober B, MD   8 months ago Type 2 diabetes mellitus with obesity   [place] [facility] [place] [facility] Dept Of [place]. [facility] Vicci Sober NOVAK, MD   1 year ago Type 2 diabetes mellitus with obesity   [place] [facility] - A Dept Of [place]. [facility] Vicci Sober NOVAK, MD   1 year ago Essential hypertension   [place] [facility] [place] - A Dept Of [place]. [facility] Fleeta Tonia Garnette LITTIE, RPH-CPP   1 year ago Hematoma of buttock   [place] [facility] - [facility] Dept Of [place]. [facility] Vicci Sober B, MD               Blood Glucose Monitoring Suppl (TRUE METRIX METER) w/Device KIT 1 kit 0    Sig: Use 4 (four) times daily to check blood sugar     Endocrinology: Diabetes - Testing Supplies Passed - 10/22/2024  1:31 PM      Passed - Valid encounter within last 12 months    Recent Outpatient Visits           2 months ago Type 2 diabetes mellitus associated with morbid obesity (HCC)   [place] [facility] [place] [facility] Of [place]. [facility] Vicci Sober B, MD   8 months ago Type 2 diabetes mellitus with obesity   [place] [facility] [place] [facility] Dept Of [place]. [facility] Vicci Sober NOVAK, MD   1 year ago Type 2 diabetes mellitus with obesity   [place] [facility] - A Dept Of [place]. [facility] Vicci Sober NOVAK, MD   1 year ago Essential hypertension   [place] [facility] [place] [facility] A Dept Of [place]. [facility] Fleeta Tonia Garnette LITTIE, RPH-CPP   1 year ago Hematoma of buttock   [facility] [facility] - A Dept Of [place]. [facility] Vicci Sober NOVAK, MD

## 2024-10-23 ENCOUNTER — Other Ambulatory Visit: Payer: Self-pay

## 2024-11-05 ENCOUNTER — Other Ambulatory Visit: Payer: Self-pay

## 2024-11-06 ENCOUNTER — Other Ambulatory Visit: Payer: Self-pay | Admitting: Pharmacist

## 2024-11-06 ENCOUNTER — Other Ambulatory Visit: Payer: Self-pay

## 2024-11-06 DIAGNOSIS — E669 Obesity, unspecified: Secondary | ICD-10-CM

## 2024-11-06 MED ORDER — TRUE METRIX BLOOD GLUCOSE TEST VI STRP
ORAL_STRIP | 2 refills | Status: AC
  Filled 2024-11-06: qty 100, 100d supply, fill #0

## 2025-02-07 ENCOUNTER — Ambulatory Visit: Payer: Self-pay | Admitting: Internal Medicine
